# Patient Record
Sex: Female | Born: 1940 | Race: White | Hispanic: No | Marital: Married | Smoking: Never smoker
Health system: Southern US, Community
[De-identification: ages and names within clinical notes are randomized; demographics above are authoritative.]

## PROBLEM LIST (undated history)

## (undated) DIAGNOSIS — E119 Type 2 diabetes mellitus without complications: Secondary | ICD-10-CM

## (undated) DIAGNOSIS — K5790 Diverticulosis of intestine, part unspecified, without perforation or abscess without bleeding: Secondary | ICD-10-CM

## (undated) DIAGNOSIS — E785 Hyperlipidemia, unspecified: Secondary | ICD-10-CM

## (undated) DIAGNOSIS — D649 Anemia, unspecified: Secondary | ICD-10-CM

## (undated) DIAGNOSIS — I779 Disorder of arteries and arterioles, unspecified: Secondary | ICD-10-CM

## (undated) DIAGNOSIS — R931 Abnormal findings on diagnostic imaging of heart and coronary circulation: Secondary | ICD-10-CM

## (undated) DIAGNOSIS — I639 Cerebral infarction, unspecified: Secondary | ICD-10-CM

## (undated) DIAGNOSIS — I739 Peripheral vascular disease, unspecified: Secondary | ICD-10-CM

## (undated) DIAGNOSIS — I2510 Atherosclerotic heart disease of native coronary artery without angina pectoris: Secondary | ICD-10-CM

## (undated) DIAGNOSIS — IMO0001 Reserved for inherently not codable concepts without codable children: Secondary | ICD-10-CM

## (undated) DIAGNOSIS — M1990 Unspecified osteoarthritis, unspecified site: Secondary | ICD-10-CM

## (undated) HISTORY — DX: Unspecified osteoarthritis, unspecified site: M19.90

## (undated) HISTORY — DX: Anemia, unspecified: D64.9

## (undated) HISTORY — DX: Type 2 diabetes mellitus without complications: E11.9

## (undated) HISTORY — DX: Cerebral infarction, unspecified: I63.9

## (undated) HISTORY — DX: Diverticulosis of intestine, part unspecified, without perforation or abscess without bleeding: K57.90

## (undated) HISTORY — DX: Atherosclerotic heart disease of native coronary artery without angina pectoris: I25.10

## (undated) HISTORY — DX: Reserved for inherently not codable concepts without codable children: IMO0001

## (undated) HISTORY — DX: Disorder of arteries and arterioles, unspecified: I77.9

## (undated) HISTORY — PX: ABDOMINAL HYSTERECTOMY: SHX81

## (undated) HISTORY — DX: Peripheral vascular disease, unspecified: I73.9

## (undated) HISTORY — DX: Abnormal findings on diagnostic imaging of heart and coronary circulation: R93.1

## (undated) HISTORY — DX: Hyperlipidemia, unspecified: E78.5

---

## 2000-01-28 ENCOUNTER — Other Ambulatory Visit: Admission: RE | Admit: 2000-01-28 | Discharge: 2000-01-28 | Payer: Self-pay | Admitting: Obstetrics and Gynecology

## 2001-05-22 ENCOUNTER — Ambulatory Visit (HOSPITAL_COMMUNITY): Admission: RE | Admit: 2001-05-22 | Discharge: 2001-05-22 | Payer: Self-pay | Admitting: Obstetrics and Gynecology

## 2001-05-22 ENCOUNTER — Encounter: Payer: Self-pay | Admitting: Obstetrics and Gynecology

## 2002-08-08 ENCOUNTER — Encounter: Payer: Self-pay | Admitting: Obstetrics and Gynecology

## 2002-08-08 ENCOUNTER — Encounter: Admission: RE | Admit: 2002-08-08 | Discharge: 2002-08-08 | Payer: Self-pay | Admitting: Obstetrics and Gynecology

## 2003-04-28 ENCOUNTER — Encounter: Admission: RE | Admit: 2003-04-28 | Discharge: 2003-04-28 | Payer: Self-pay | Admitting: Obstetrics and Gynecology

## 2003-04-28 ENCOUNTER — Encounter: Payer: Self-pay | Admitting: Obstetrics and Gynecology

## 2003-12-09 ENCOUNTER — Encounter: Admission: RE | Admit: 2003-12-09 | Discharge: 2003-12-09 | Payer: Self-pay | Admitting: Obstetrics and Gynecology

## 2004-09-08 ENCOUNTER — Ambulatory Visit: Payer: Self-pay | Admitting: Internal Medicine

## 2004-10-06 ENCOUNTER — Ambulatory Visit: Payer: Self-pay | Admitting: Internal Medicine

## 2004-11-07 ENCOUNTER — Encounter: Payer: Self-pay | Admitting: Internal Medicine

## 2004-11-07 LAB — CONVERTED CEMR LAB

## 2004-11-17 ENCOUNTER — Ambulatory Visit: Payer: Self-pay | Admitting: Internal Medicine

## 2004-11-25 ENCOUNTER — Ambulatory Visit: Payer: Self-pay | Admitting: Family Medicine

## 2005-01-19 ENCOUNTER — Encounter: Admission: RE | Admit: 2005-01-19 | Discharge: 2005-01-19 | Payer: Self-pay

## 2005-02-04 ENCOUNTER — Ambulatory Visit: Payer: Self-pay | Admitting: Internal Medicine

## 2005-05-04 ENCOUNTER — Ambulatory Visit: Payer: Self-pay | Admitting: Internal Medicine

## 2005-05-31 ENCOUNTER — Ambulatory Visit: Payer: Self-pay | Admitting: Internal Medicine

## 2005-07-05 ENCOUNTER — Ambulatory Visit: Payer: Self-pay | Admitting: Internal Medicine

## 2005-08-03 ENCOUNTER — Ambulatory Visit: Payer: Self-pay | Admitting: Internal Medicine

## 2005-09-22 ENCOUNTER — Ambulatory Visit: Payer: Self-pay | Admitting: Internal Medicine

## 2005-11-02 ENCOUNTER — Ambulatory Visit: Payer: Self-pay | Admitting: Internal Medicine

## 2005-12-22 ENCOUNTER — Ambulatory Visit: Payer: Self-pay | Admitting: Family Medicine

## 2006-03-07 ENCOUNTER — Encounter: Admission: RE | Admit: 2006-03-07 | Discharge: 2006-03-07 | Payer: Self-pay | Admitting: Obstetrics and Gynecology

## 2007-01-16 ENCOUNTER — Ambulatory Visit: Payer: Self-pay | Admitting: Internal Medicine

## 2007-02-06 ENCOUNTER — Ambulatory Visit: Payer: Self-pay | Admitting: Gastroenterology

## 2007-02-16 ENCOUNTER — Ambulatory Visit: Payer: Self-pay | Admitting: Gastroenterology

## 2007-02-16 ENCOUNTER — Encounter (INDEPENDENT_AMBULATORY_CARE_PROVIDER_SITE_OTHER): Payer: Self-pay | Admitting: Specialist

## 2007-03-01 ENCOUNTER — Ambulatory Visit: Payer: Self-pay | Admitting: Internal Medicine

## 2007-03-01 LAB — CONVERTED CEMR LAB
ALT: 14 units/L (ref 0–40)
AST: 24 units/L (ref 0–37)
Alkaline Phosphatase: 89 units/L (ref 39–117)
Anti Nuclear Antibody(ANA): NEGATIVE
BUN: 10 mg/dL (ref 6–23)
Basophils Relative: 0.2 % (ref 0.0–1.0)
Bilirubin, Direct: 0.1 mg/dL (ref 0.0–0.3)
CO2: 33 meq/L — ABNORMAL HIGH (ref 19–32)
Calcium: 9.6 mg/dL (ref 8.4–10.5)
Chloride: 102 meq/L (ref 96–112)
Direct LDL: 112.6 mg/dL
Eosinophils Relative: 5 % (ref 0.0–5.0)
Glucose, Bld: 244 mg/dL — ABNORMAL HIGH (ref 70–99)
HCT: 38.9 % (ref 36.0–46.0)
Hemoglobin: 13.2 g/dL (ref 12.0–15.0)
Lymphocytes Relative: 18.7 % (ref 12.0–46.0)
MCV: 88.6 fL (ref 78.0–100.0)
Monocytes Relative: 6.6 % (ref 3.0–11.0)
Platelets: 206 10*3/uL (ref 150–400)
RBC: 4.39 M/uL (ref 3.87–5.11)
RDW: 11.7 % (ref 11.5–14.6)
Rhuematoid fact SerPl-aCnc: <20 intl units/mL — ABNORMAL LOW (ref 0.0–20.0)
Total Protein: 7 g/dL (ref 6.0–8.3)

## 2007-03-08 ENCOUNTER — Ambulatory Visit: Payer: Self-pay | Admitting: Internal Medicine

## 2007-03-13 ENCOUNTER — Encounter: Admission: RE | Admit: 2007-03-13 | Discharge: 2007-06-11 | Payer: Self-pay | Admitting: Internal Medicine

## 2007-03-14 ENCOUNTER — Encounter: Payer: Self-pay | Admitting: Internal Medicine

## 2007-03-14 DIAGNOSIS — E119 Type 2 diabetes mellitus without complications: Secondary | ICD-10-CM | POA: Insufficient documentation

## 2007-03-14 DIAGNOSIS — M1990 Unspecified osteoarthritis, unspecified site: Secondary | ICD-10-CM | POA: Insufficient documentation

## 2007-03-14 DIAGNOSIS — I10 Essential (primary) hypertension: Secondary | ICD-10-CM

## 2007-03-15 ENCOUNTER — Encounter: Admission: RE | Admit: 2007-03-15 | Discharge: 2007-03-15 | Payer: Self-pay | Admitting: Internal Medicine

## 2007-05-01 ENCOUNTER — Ambulatory Visit: Payer: Self-pay | Admitting: Internal Medicine

## 2007-05-01 DIAGNOSIS — E785 Hyperlipidemia, unspecified: Secondary | ICD-10-CM

## 2007-05-01 DIAGNOSIS — Z860100 Personal history of colon polyps, unspecified: Secondary | ICD-10-CM | POA: Insufficient documentation

## 2007-05-01 DIAGNOSIS — K5730 Diverticulosis of large intestine without perforation or abscess without bleeding: Secondary | ICD-10-CM | POA: Insufficient documentation

## 2007-08-01 ENCOUNTER — Ambulatory Visit: Payer: Self-pay | Admitting: Internal Medicine

## 2007-08-01 DIAGNOSIS — I498 Other specified cardiac arrhythmias: Secondary | ICD-10-CM | POA: Insufficient documentation

## 2007-08-29 ENCOUNTER — Ambulatory Visit: Payer: Self-pay | Admitting: Internal Medicine

## 2007-10-09 ENCOUNTER — Ambulatory Visit: Payer: Self-pay | Admitting: Internal Medicine

## 2007-10-25 ENCOUNTER — Ambulatory Visit: Payer: Self-pay | Admitting: Internal Medicine

## 2008-02-08 ENCOUNTER — Ambulatory Visit: Payer: Self-pay | Admitting: Internal Medicine

## 2008-02-10 LAB — CONVERTED CEMR LAB
AST: 22 units/L (ref 0–37)
Albumin: 4.1 g/dL (ref 3.5–5.2)
BUN: 10 mg/dL (ref 6–23)
Basophils Absolute: 0 10*3/uL (ref 0.0–0.1)
Basophils Relative: 0.5 % (ref 0.0–1.0)
Calcium: 9.5 mg/dL (ref 8.4–10.5)
Cholesterol: 146 mg/dL (ref 0–200)
Creatinine, Ser: 1 mg/dL (ref 0.4–1.2)
Creatinine,U: 255.5 mg/dL
Eosinophils Absolute: 0.3 10*3/uL (ref 0.0–0.7)
Eosinophils Relative: 5 % (ref 0.0–5.0)
GFR calc Af Amer: 71 mL/min
GFR calc non Af Amer: 59 mL/min
HCT: 39.6 % (ref 36.0–46.0)
Hgb A1c MFr Bld: 6.4 % — ABNORMAL HIGH (ref 4.6–6.0)
MCHC: 32.3 g/dL (ref 30.0–36.0)
MCV: 91.7 fL (ref 78.0–100.0)
Microalb Creat Ratio: 12.5 mg/g (ref 0.0–30.0)
Microalb, Ur: 3.2 mg/dL — ABNORMAL HIGH (ref 0.0–1.9)
Monocytes Absolute: 0.4 10*3/uL (ref 0.1–1.0)
Neutro Abs: 3.5 10*3/uL (ref 1.4–7.7)
Neutrophils Relative %: 66.5 % (ref 43.0–77.0)
RBC: 4.32 M/uL (ref 3.87–5.11)
TSH: 2.9 microintl units/mL (ref 0.35–5.50)
Total Bilirubin: 0.8 mg/dL (ref 0.3–1.2)
VLDL: 27 mg/dL (ref 0–40)
WBC: 5.3 10*3/uL (ref 4.5–10.5)

## 2008-02-18 ENCOUNTER — Ambulatory Visit: Payer: Self-pay | Admitting: Internal Medicine

## 2008-02-18 LAB — CONVERTED CEMR LAB: HDL goal, serum: 40 mg/dL

## 2008-04-14 ENCOUNTER — Encounter: Admission: RE | Admit: 2008-04-14 | Discharge: 2008-04-14 | Payer: Self-pay | Admitting: Internal Medicine

## 2008-08-05 ENCOUNTER — Ambulatory Visit: Payer: Self-pay | Admitting: Internal Medicine

## 2008-08-08 ENCOUNTER — Ambulatory Visit: Payer: Self-pay | Admitting: Internal Medicine

## 2009-02-13 ENCOUNTER — Ambulatory Visit: Payer: Self-pay | Admitting: Internal Medicine

## 2009-02-13 LAB — CONVERTED CEMR LAB
ALT: 12 units/L (ref 0–35)
AST: 17 units/L (ref 0–37)
Alkaline Phosphatase: 58 units/L (ref 39–117)
Bilirubin Urine: NEGATIVE
Blood in Urine, dipstick: NEGATIVE
Chloride: 103 meq/L (ref 96–112)
Creatinine,U: 146.9 mg/dL
Eosinophils Relative: 6.8 % — ABNORMAL HIGH (ref 0.0–5.0)
GFR calc non Af Amer: 66.02 mL/min (ref >60)
Glucose, Bld: 133 mg/dL — ABNORMAL HIGH (ref 70–99)
Hgb A1c MFr Bld: 6.7 % — ABNORMAL HIGH (ref 4.6–6.5)
Ketones, urine, test strip: NEGATIVE
LDL Cholesterol: 87 mg/dL (ref 0–99)
Lymphocytes Relative: 22.5 % (ref 12.0–46.0)
Microalb Creat Ratio: 5.4 mg/g (ref 0.0–30.0)
Microalb, Ur: 0.8 mg/dL (ref 0.0–1.9)
Monocytes Relative: 8.2 % (ref 3.0–12.0)
Neutrophils Relative %: 62 % (ref 43.0–77.0)
Platelets: 149 10*3/uL — ABNORMAL LOW (ref 150.0–400.0)
Potassium: 3.8 meq/L (ref 3.5–5.1)
Sodium: 140 meq/L (ref 135–145)
TSH: 2.7 microintl units/mL (ref 0.35–5.50)
Total Bilirubin: 0.7 mg/dL (ref 0.3–1.2)
Urobilinogen, UA: 0.2
VLDL: 24.2 mg/dL (ref 0.0–40.0)
WBC: 4.8 10*3/uL (ref 4.5–10.5)

## 2009-02-20 ENCOUNTER — Ambulatory Visit: Payer: Self-pay | Admitting: Internal Medicine

## 2009-05-06 ENCOUNTER — Encounter: Admission: RE | Admit: 2009-05-06 | Discharge: 2009-05-06 | Payer: Self-pay | Admitting: Internal Medicine

## 2009-08-14 ENCOUNTER — Ambulatory Visit: Payer: Self-pay | Admitting: Internal Medicine

## 2009-08-14 LAB — CONVERTED CEMR LAB
Eosinophils Relative: 7.2 % — ABNORMAL HIGH (ref 0.0–5.0)
Ferritin: 73.9 ng/mL (ref 10.0–291.0)
Hgb A1c MFr Bld: 7 % — ABNORMAL HIGH (ref 4.6–6.5)
MCV: 91.7 fL (ref 78.0–100.0)
Monocytes Absolute: 0.4 10*3/uL (ref 0.1–1.0)
Monocytes Relative: 7.7 % (ref 3.0–12.0)
Neutrophils Relative %: 64.9 % (ref 43.0–77.0)
Platelets: 173 10*3/uL (ref 150.0–400.0)
WBC: 5.4 10*3/uL (ref 4.5–10.5)

## 2009-08-21 ENCOUNTER — Ambulatory Visit: Payer: Self-pay | Admitting: Internal Medicine

## 2009-09-22 ENCOUNTER — Telehealth: Payer: Self-pay | Admitting: *Deleted

## 2009-09-24 ENCOUNTER — Telehealth: Payer: Self-pay | Admitting: *Deleted

## 2009-10-07 ENCOUNTER — Ambulatory Visit: Payer: Self-pay | Admitting: Internal Medicine

## 2009-11-07 HISTORY — PX: CATARACT EXTRACTION: SUR2

## 2010-02-12 ENCOUNTER — Ambulatory Visit: Payer: Self-pay | Admitting: Internal Medicine

## 2010-02-12 LAB — CONVERTED CEMR LAB
ALT: 13 units/L (ref 0–35)
AST: 18 units/L (ref 0–37)
Direct LDL: 99.6 mg/dL
Hgb A1c MFr Bld: 7 % — ABNORMAL HIGH (ref 4.6–6.5)
Microalb Creat Ratio: 12.2 mg/g (ref 0.0–30.0)
Total Bilirubin: 0.5 mg/dL (ref 0.3–1.2)
Triglycerides: 214 mg/dL — ABNORMAL HIGH (ref 0.0–149.0)

## 2010-02-19 ENCOUNTER — Ambulatory Visit: Payer: Self-pay | Admitting: Internal Medicine

## 2010-05-21 ENCOUNTER — Encounter: Admission: RE | Admit: 2010-05-21 | Discharge: 2010-05-21 | Payer: Self-pay | Admitting: Internal Medicine

## 2010-05-21 LAB — HM MAMMOGRAPHY

## 2010-05-24 ENCOUNTER — Telehealth: Payer: Self-pay | Admitting: *Deleted

## 2010-07-16 ENCOUNTER — Ambulatory Visit: Payer: Self-pay | Admitting: Internal Medicine

## 2010-07-16 LAB — CONVERTED CEMR LAB
Cholesterol: 165 mg/dL (ref 0–200)
HDL: 32.7 mg/dL — ABNORMAL LOW (ref >39.00)
Total CHOL/HDL Ratio: 5
Triglycerides: 169 mg/dL — ABNORMAL HIGH (ref 0.0–149.0)

## 2010-07-23 ENCOUNTER — Ambulatory Visit: Payer: Self-pay | Admitting: Internal Medicine

## 2010-07-23 LAB — HM DIABETES FOOT EXAM

## 2010-09-07 HISTORY — PX: OTHER SURGICAL HISTORY: SHX169

## 2010-09-21 ENCOUNTER — Encounter: Payer: Self-pay | Admitting: Internal Medicine

## 2010-09-23 ENCOUNTER — Encounter: Payer: Self-pay | Admitting: Family Medicine

## 2010-09-23 ENCOUNTER — Encounter: Payer: Self-pay | Admitting: *Deleted

## 2010-10-19 ENCOUNTER — Ambulatory Visit: Payer: Self-pay | Admitting: Internal Medicine

## 2010-10-25 ENCOUNTER — Ambulatory Visit: Payer: Self-pay | Admitting: Internal Medicine

## 2010-10-25 DIAGNOSIS — D485 Neoplasm of uncertain behavior of skin: Secondary | ICD-10-CM

## 2010-11-03 ENCOUNTER — Telehealth (INDEPENDENT_AMBULATORY_CARE_PROVIDER_SITE_OTHER): Payer: Self-pay | Admitting: *Deleted

## 2010-11-26 ENCOUNTER — Ambulatory Visit
Admission: RE | Admit: 2010-11-26 | Discharge: 2010-11-26 | Payer: Self-pay | Source: Home / Self Care | Attending: Internal Medicine | Admitting: Internal Medicine

## 2010-11-26 ENCOUNTER — Other Ambulatory Visit: Payer: Self-pay | Admitting: Internal Medicine

## 2010-11-26 DIAGNOSIS — T50995A Adverse effect of other drugs, medicaments and biological substances, initial encounter: Secondary | ICD-10-CM | POA: Insufficient documentation

## 2010-12-09 NOTE — Assessment & Plan Note (Signed)
Summary: 6 month rov/njr   Vital Signs:  Patient profile:   70 year old female Menstrual status:  hysterectomy Height:      62 inches Weight:      137 pounds BMI:     25.15 Pulse rate:   73 / minute BP sitting:   120 / 78  (left arm) Cuff size:   regular  Vitals Entered By: Romualdo Bolk, CMA (AAMA) (February 19, 2010 10:15 AM) CC: Follow-up visit on labs, Hypertension Management   History of Present Illness: Sharon Wilson comesin comes in today  for  follow up of multiple medical problems .   DM :  doing ok no change in meds   no lows .  could eat better . LIPIDs taking the meds   less exercise recently and dietary indiscretion    OA : no change  Bp and tachy stable no co .   No change in vision neuro . Cv PUlm signs  over the counter b12  about once a week now. No balance or numbness problem OA; NO chnage    Hypertension History:      She denies headache, chest pain, palpitations, dyspnea with exertion, orthopnea, PND, peripheral edema, visual symptoms, neurologic problems, syncope, and side effects from treatment.  She notes no problems with any antihypertensive medication side effects.        Positive major cardiovascular risk factors include female age 19 years old or older, diabetes, hyperlipidemia, and hypertension.  Negative major cardiovascular risk factors include non-tobacco-user status.      Preventive Screening-Counseling & Management  Alcohol-Tobacco     Alcohol drinks/day: 0     Smoking Status: never  Caffeine-Diet-Exercise     Caffeine use/day: 1     Does Patient Exercise: yes  Hep-HIV-STD-Contraception     Dental Visit-last 6 months yes  Safety-Violence-Falls     Seat Belt Use: yes  Current Medications (verified): 1)  Aspir-81 81 Mg Tbec (Aspirin) .... Take 1 Tablet By Mouth Every Morning 2)  Fish Oil 500 Mg Caps (Omega-3 Fatty Acids) .... Take 1 Capsule By Mouth Twice A Day 3)  Os-Cal 500 + D 500-400 Mg-Unit Tabs (Calcium  Carbonate-Vitamin D) .... Take 1 Tablet By Mouth Twice A  Day 4)  Metformin Hcl 500 Mg  Tabs (Metformin Hcl) .Marland Kitchen.. 1 Two Times A Day 5)  Metoprolol Tartrate 25 Mg  Tabs (Metoprolol Tartrate) .Marland Kitchen.. 1 By Mouth Two Times A Day 6)  Pravachol 40 Mg  Tabs (Pravastatin Sodium) .... 2 By Mouth Once Daily 7)  Vitamin B-12 1000 Mcg Tabs (Cyanocobalamin)  Allergies (verified): No Known Drug Allergies  Past History:  Past medical, surgical, family and social histories (including risk factors) reviewed, and no changes noted (except as noted below).  Past Medical History: Reviewed history from 02/20/2009 and no changes required. Diabetes mellitus, type II Hypertension with white coat component Osteoarthritis G4P4 Diverticulosis, colon Colonic polyps, hx of Hyperlipidemia Anemia before hysterectomy   CONSULTANTS  Jarold Motto  Past Surgical History: Reviewed history from 08/21/2009 and no changes required. Hysterectomy for bleeding fibroid   Past History:  Care Management:  gets regular eye checks  GI  [facility]  Family History: Reviewed history from 02/20/2009 and no changes required. [place] no change mom breast cancer  died age 41 father died cad age 69    Social History: Reviewed history from 02/20/2009 and no changes required. Never Smoked  no ETS Regular exercise-yes not as much recently  Married husband retired now.  hh of 2    no pets    Dental Care w/in 6 mos.:  yes Seat Belt Use:  yes  Review of Systems  The patient denies anorexia, fever, weight loss, weight gain, vision loss, decreased hearing, hoarseness, chest pain, syncope, dyspnea on exertion, peripheral edema, prolonged cough, headaches, abdominal pain, melena, hematochezia, severe indigestion/heartburn, hematuria, transient blindness, difficulty walking, depression, abnormal bleeding, enlarged lymph nodes, and angioedema.    Physical Exam  General:  Well-developed,well-nourished,in no acute distress;  alert,appropriate and cooperative throughout examination Head:  normocephalic and atraumatic.   Eyes:  .ey  Neck:  No deformities, masses, or tenderness noted. Lungs:  Normal respiratory effort, chest expands symmetrically. Lungs are clear to auscultation, no crackles or wheezes. Heart:  Normal rate and regular rhythm. S1 and S2 normal without gallop, murmur, click, rub or other extra sounds.no lifts.   Abdomen:  Bowel sounds positive,abdomen soft and non-tender without masses, organomegaly or   noted. Pulses:  pulses intact without delay   Extremities:  no clubbing cyanosis or edema  Skin:  turgor normal, color normal, no ecchymoses, and no petechiae.   Cervical Nodes:  No lymphadenopathy noted Psych:  Oriented X3, good eye contact, not anxious appearing, and not depressed appearing.    Diabetes Management Exam:    Foot Exam (with socks and/or shoes not present):       Sensory-Monofilament:          Left foot: normal          Right foot: normal       Inspection:          Left foot: normal          Right foot: normal       Nails:          Left foot: normal          Right foot: normal   Impression & Recommendations:  Problem # 1:  DIABETES MELLITUS, TYPE II (ICD-250.00) Assessment Deteriorated  re institute lifestyle intervention  Her updated medication list for this problem includes:    Aspir-81 81 Mg Tbec (Aspirin) .Marland Kitchen... Take 1 tablet by mouth every morning    Metformin Hcl 500 Mg Tabs (Metformin hcl) .Marland Kitchen... 1 two times a day  Labs Reviewed: Creat: 0.9 (02/13/2009)     Last Eye Exam: normal (06/07/2009) Reviewed HgBA1c results: 7.0 (02/12/2010)  7.0 (08/14/2009)  Orders: Prescription Created Electronically [id number])  Problem # 2:  HYPERLIPIDEMIA (ICD-272.4)  intensify lifestyle intervention for the tg  Her updated medication list for this problem includes:    Pravachol 40 Mg Tabs (Pravastatin sodium) .Marland Kitchen... 2 by mouth once daily  Labs Reviewed: SGOT: 18 (02/12/2010)    SGPT: 13 (02/12/2010)  Lipid Goals: Chol Goal: 200 (02/18/2008)   HDL Goal: 40 (02/18/2008)   LDL Goal: 100 (02/18/2008)   TG Goal: 150 (02/18/2008)  10 Yr Risk Heart Disease: 17 % Prior 10 Yr Risk Heart Disease: > 32 % (02/18/2008)   HDL:38.50 (02/12/2010), 34.00 (02/13/2009)  LDL:87 (02/13/2009), 87 (02/08/2008)  Chol:169 (02/12/2010), 145 (02/13/2009)  Trig:214.0 (02/12/2010), 121.0 (02/13/2009)  Problem # 3:  HYPERTENSION (ICD-401.9)  Her updated medication list for this problem includes:    Metoprolol Tartrate 25 Mg Tabs (Metoprolol tartrate) .Marland Kitchen... 1 by mouth two times a day  BP today: 120/78 Prior BP: 120/80 (10/07/2009)  10 Yr Risk Heart Disease: 17 % Prior 10 Yr Risk Heart Disease: > 32 % (02/18/2008)  Labs Reviewed: K+: 3.8 (02/13/2009) Creat: : 0.9 (02/13/2009)  Chol: 169 (02/12/2010)   HDL: 38.50 (02/12/2010)   LDL: 87 (02/13/2009)   TG: 214.0 (02/12/2010)  Problem # 4:  lower b 12   on otc weekly   will recheck at next blood test.    Problem # 5:  OSTEOARTHRITIS (ICD-715.90) no change  Her updated medication list for this problem includes:    Aspir-81 81 Mg Tbec (Aspirin) .Marland Kitchen... Take 1 tablet by mouth every morning  Complete Medication List: 1)  Aspir-81 81 Mg Tbec (Aspirin) .... Take 1 tablet by mouth every morning 2)  Fish Oil 500 Mg Caps (Omega-3 fatty acids) .... Take 1 capsule by mouth twice a day 3)  Os-cal 500 + D 500-400 Mg-unit Tabs (Calcium carbonate-vitamin d) .... Take 1 tablet by mouth twice a  day 4)  Metformin Hcl 500 Mg Tabs (Metformin hcl) .Marland Kitchen.. 1 two times a day 5)  Metoprolol Tartrate 25 Mg Tabs (Metoprolol tartrate) .Marland Kitchen.. 1 by mouth two times a day 6)  Pravachol 40 Mg Tabs (Pravastatin sodium) .... 2 by mouth once daily 7)  Vitamin B-12 1000 Mcg Tabs (Cyanocobalamin)  Hypertension Assessment/Plan:      The patient's hypertensive risk group is category C: Target organ damage and/or diabetes.  Her calculated 10 year risk of coronary heart  disease is 17 %.  Today's blood pressure is 120/78.  Her blood pressure goal is < 130/80.  Patient Instructions: 1)  In September  OV 2)  HgBA1c prior to visit  ICD-9:  3)  Lipid panel prior to visit ICD-9 :  4)  B12 level    Prescriptions: METFORMIN HCL 500 MG  TABS (METFORMIN HCL) 1 two times a day  #180 Each x 2   Entered and Authorized by:   Madelin Headings MD   Signed by:   Madelin Headings MD on 02/19/2010   Method used:   Electronically to        [facility]  [phone]* (retail)       [address]       [place], [place]  [address]       Ph: [phone] or [phone]       Fax: [phone]   RxID:   [id number]   Prevention & Chronic Care Immunizations   Influenza vaccine: Fluvax 3+  (08/21/2009)    Tetanus booster: 08/21/2009: Td    Pneumococcal vaccine: Historical  (05/02/2007)    H. zoster vaccine: 02/20/2009: Zostavax  Colorectal Screening   Hemoccult: Not documented    Colonoscopy: normal  (10/08/2007)  Other Screening   Pap smear: Done  (11/07/2004)    Mammogram: ASSESSMENT: Negative - BI-RADS 1^MM DIGITAL SCREENING  (05/06/2009)    DXA bone density scan: Not documented   Smoking status: never  (02/19/2010)  Diabetes Mellitus   HgbA1C: 7.0  (02/12/2010)    Eye exam: normal  (06/07/2009)   Eye exam due: 06/2010    Foot exam: yes  (02/19/2010)   High risk foot: Not documented   Foot care education: Not documented    Urine microalbumin/creatinine ratio: 12.2  (02/12/2010)  Lipids   Total Cholesterol: 169  (02/12/2010)   LDL: 87  (02/13/2009)   LDL Direct: 99.6  (02/12/2010)   HDL: 38.50  (02/12/2010)   Triglycerides: 214.0  (02/12/2010)    SGOT (AST): 18  (02/12/2010)   SGPT (ALT): 13  (02/12/2010)   Alkaline phosphatase: 53  (02/12/2010)   Total bilirubin: 0.5  (02/12/2010)  Hypertension  Last Blood Pressure: 120 / 78  (02/19/2010)   Serum creatinine: 0.9  (02/13/2009)   Serum potassium 3.8   (02/13/2009)  Self-Management Support :    Diabetes self-management support: Not documented    Hypertension self-management support: Not documented    Lipid self-management support: Not documented

## 2010-12-09 NOTE — Miscellaneous (Signed)
Summary: Last Eye Exam   Clinical Lists Changes  Observations: Added new observation of DMEYEEXAMNXT: 09/2011 (09/23/2010 9:40) Added new observation of DMEYEEXMRES: normal (09/21/2010 9:41) Added new observation of EYE EXAM BY: [facility] (09/21/2010 9:41) Added new observation of DIAB EYE EX: normal (09/21/2010 9:41)        Diabetes Management Exam:    Eye Exam:       Eye Exam done elsewhere          Date: 09/21/2010          Results: normal          Done by: [facility]

## 2010-12-09 NOTE — Assessment & Plan Note (Signed)
Summary: 3 mo rov/mm   Vital Signs:  Patient profile:   70 year old female Menstrual status:  hysterectomy Weight:      133 pounds Pulse rate:   78 / minute BP sitting:   150 / 86  (left arm) Cuff size:   regular  Vitals Entered By: Romualdo Bolk, CMA [facility]) (October 25, 2010 2:41 PM)  Serial Vital Signs/Assessments:  Time      Position  BP       Pulse  Resp  Temp     By 2:43 PM             144/81                         Romualdo Bolk, CMA (AAMA)  Comments: 2:43 PM Pt's machine By: Romualdo Bolk, CMA (AAMA)   CC: Follow-up visit on labs and bp, Hypertension Management   History of Present Illness: Sharon Wilson.  comes in today  for follow up of BP management and  blood sugar. other issues  Also check new skin area on left .  Bp reading at  home  120/70  and 116/70    other time slight Not 140/90 but when went for cataract surgery  and was 170     but 130 at home.  BG   was   117  today usually good.  LIPD: no change in meds  Has dark spot right neck newer ? should be rmoved or just checked .     Hypertension History:      She denies headache, chest pain, palpitations, dyspnea with exertion, orthopnea, PND, peripheral edema, visual symptoms, neurologic problems, syncope, and side effects from treatment.  She notes no problems with any antihypertensive medication side effects.        Positive major cardiovascular risk factors include female age 53 years old or older, diabetes, hyperlipidemia, and hypertension.  Negative major cardiovascular risk factors include non-tobacco-user status.     Preventive Screening-Counseling & Management  Alcohol-Tobacco     Alcohol drinks/day: 0     Smoking Status: never  Caffeine-Diet-Exercise     Caffeine use/day: 1     Does Patient Exercise: yes  Current Medications (verified): 1)  Metoprolol Tartrate 25 Mg  Tabs (Metoprolol Tartrate) .Marland Kitchen.. 1 By Mouth Two Times A Day 2)  Metformin Hcl 500 Mg  Tabs (Metformin  Hcl) .Marland Kitchen.. 1 Two Times A Day 3)  Pravachol 40 Mg  Tabs (Pravastatin Sodium) .... 2 By Mouth Once Daily 4)  Aspir-81 81 Mg Tbec (Aspirin) .... Take 1 Tablet By Mouth Every Morning 5)  Fish Oil 500 Mg Caps (Omega-3 Fatty Acids) .... Take 1 Capsule By Mouth Twice A Day 6)  Os-Cal 500 + D 500-400 Mg-Unit Tabs (Calcium Carbonate-Vitamin D) .... Take 1 Tablet By Mouth Twice A  Day  Allergies (verified): No Known Drug Allergies  Past History:  Past medical, surgical, family and social histories (including risk factors) reviewed, and no changes noted (except as noted below).  Past Medical History: Diabetes mellitus, type II Hypertension with white coat component Osteoarthritis G4P4 Diverticulosis, colon Colonic polyps, hx of Hyperlipidemia Anemia before hysterectomy  Carotid dopplers NOvember 2011 per [facility]  CON[facility]ULTANTS  Jarold Motto  Past Surgical History: Hysterectomy for bleeding fibroid  Cataract extraction 2011  Past History:  Care Management:  gets regular eye checks  GI  [facility]  Family History: Reviewed history from 02/20/2009 and no changes required.  [place] no change mom breast cancer  died age 69 father died cad age 82    Social History: Reviewed history from 02/19/2010 and no changes required. Never Smoked  no ETS Regular exercise-yes not as much recently  Married husband retired now.  hh of 2    no pets      Review of Systems  The patient denies anorexia, fever, weight loss, weight gain, vision loss, chest pain, prolonged cough, melena, hematochezia, severe indigestion/heartburn, and abnormal bleeding.         see hpi  Physical Exam  General:  Well-developed,well-nourished,in no acute distress; alert,appropriate and cooperative throughout examination Head:  normocephalic and atraumatic.   Eyes:  vision grossly intact.   Neck:  No deformities, masses, or tenderness noted. Lungs:  Normal respiratory effort, chest expands symmetrically. Lungs are clear  to auscultation, no crackles or wheezes.no dullness.   Heart:  Normal rate and regular rhythm. S1 and S2 normal without gallop, murmur, click, rub or other extra sounds.no lifts.   Pulses:  pulses intact without delay   Skin:  right neck with 1-2 mm grey dark  lesion  with center   pore?    firm non tender no redness Cervical Nodes:  No lymphadenopathy noted Psych:  Oriented X3, good eye contact, not anxious appearing, and not depressed appearing.     Impression & Recommendations:  Problem # 1:  HYPERTENSION (ICD-401.9) unclear control  and unsure why not  yet tried on an ace  .    meds prob predated  dx   will try to transition to ace to see if ocntrolled  Her updated medication list for this problem includes:    Metoprolol Tartrate 25 Mg Tabs (Metoprolol tartrate) .Marland Kitchen... 1 by mouth two times a day    Prinivil 20 Mg Tabs (Lisinopril) .Marland Kitchen... 1 by mouth once daily or as directed  Problem # 2:  DIABETES MELLITUS, TYPE II (ICD-250.00) could be better  Her updated medication list for this problem includes:    Metformin Hcl 500 Mg Tabs (Metformin hcl) .Marland Kitchen... 1 two times a day    Aspir-81 81 Mg Tbec (Aspirin) .Marland Kitchen... Take 1 tablet by mouth every morning    Prinivil 20 Mg Tabs (Lisinopril) .Marland Kitchen... 1 by mouth once daily or as directed  Labs Reviewed: Creat: 0.9 (02/13/2009)     Last Eye Exam: normal (09/21/2010) Reviewed HgBA1c results: 6.9 (10/19/2010)  7.1 (07/16/2010)  Problem # 3:  SKIN LESION, UNCERTAIN SIGNIFICANCE (ICD-238.2) Assessment: New right neck  ? has a pore  can return for removal     Problem # 4:  HYPERLIPIDEMIA (ICD-272.4)  Her updated medication list for this problem includes:    Pravachol 40 Mg Tabs (Pravastatin sodium) .Marland Kitchen... 2 by mouth once daily  Labs Reviewed: SGOT: 18 (02/12/2010)   SGPT: 13 (02/12/2010)  Lipid Goals: Chol Goal: 200 (02/18/2008)   HDL Goal: 40 (02/18/2008)   LDL Goal: 100 (02/18/2008)   TG Goal: 150 (02/18/2008)  10 Yr Risk Heart Disease: > 32  % Prior 10 Yr Risk Heart Disease: 17 % (02/19/2010)   HDL:32.70 (07/16/2010), 38.50 (02/12/2010)  LDL:99 (07/16/2010), 87 (02/13/2009)  Chol:165 (07/16/2010), 169 (02/12/2010)  Trig:169.0 (07/16/2010), 214.0 (02/12/2010)  Problem # 5:  ? of THYROID NODULE, LEFT (ICD-241.0) after patient left  i reviewed    her record and discovered a  scanned doppler reort  from [facility]  had dopplers  of carotids that were normal.  However ther was commment of  thyroid nodule  on left that  could be followed up wth [place] .  This report was never sent to my desktop but to a colleagues and thus I was never aware of this recommendation.  Corrie Dandy  Complete Medication List: 1)  Metoprolol Tartrate 25 Mg Tabs (Metoprolol tartrate) .Marland Kitchen.. 1 by mouth two times a day 2)  Metformin Hcl 500 Mg Tabs (Metformin hcl) .Marland Kitchen.. 1 two times a day 3)  Pravachol 40 Mg Tabs (Pravastatin sodium) .... 2 by mouth once daily 4)  Aspir-81 81 Mg Tbec (Aspirin) .... Take 1 tablet by mouth every morning 5)  Fish Oil 500 Mg Caps (Omega-3 fatty acids) .... Take 1 capsule by mouth twice a day 6)  Os-cal 500 + D 500-400 Mg-unit Tabs (Calcium carbonate-vitamin d) .... Take 1 tablet by mouth twice a  day 7)  Prinivil 20 Mg Tabs (Lisinopril) .Marland Kitchen.. 1 by mouth once daily or as directed  Hypertension Assessment/Plan:      The patient's hypertensive risk group is category C: Target organ damage and/or diabetes.  Her calculated 10 year risk of coronary heart disease is > 32 %.  Today's blood pressure is 150/86.  Her blood pressure goal is < 130/80.  Patient Instructions: 1)  begin  new bp medication  and decrease  metoprolol to 1/2 by mouth two times a day . 2)  (after   if bp  becomeing low  below 105   and  dc the metoprolol  ( take 1/2 once daily for a week then  stop . ) 3)  otherwise    stay on half  dose  . 4)  schedule for  mole removal  and we can recheck your BP situation in a month.  Prescriptions: PRINIVIL 20 MG TABS (LISINOPRIL) 1 by mouth  once daily or as directed  #30 x 3   Entered and Authorized by:   Madelin Headings MD   Signed by:   Madelin Headings MD on 10/25/2010   Method used:   Electronically to        [facility]  [phone]* (retail)       [address]       [place], [place]  [address]       Ph: [phone] or [phone]       Fax: [phone]   RxID:   [id number] METOPROLOL TARTRATE 25 MG  TABS (METOPROLOL TARTRATE) 1 by mouth two times a day  #60 x 3   Entered and Authorized by:   Madelin Headings MD   Signed by:   Madelin Headings MD on 10/25/2010   Method used:   Electronically to        [facility]  [phone]* (retail)       [address]       [place], [place]  [address]       Ph: [phone] or [phone]       Fax: [phone]   RxID:   [id number]    Orders Added: 1)  Est. Patient Level IV [42706]   after patient left  i reviewed    her record and discovered a  scanned doppler reort  from [facility]  had dopplers  of carotids that were normal.  However ther was commment of  thyroid nodule    on left that  could be followed up wth [place] .  This  report was never sent to my desktop but to a colleagues and thus I was never aware of this recommendation.  Corrie Dandy  Appended Document: 3 mo rov/mm please notify patient that on record review it was noted that  there was a ? of thyroid nodule on her doppler done by [facility]. (This report  was not given to me to review.  )  this may not be  of clinical significance  but would rec getting thyroid ultrasound to delineate this  before her next visit .   Appended Document: Orders Update  Pt aware and wants to go ahead with referral. Order sent to [facility]. Romualdo Bolk, CMA [facility])  November 02, 2010 12:43 PM   Clinical Lists Changes  Orders: Added new Referral order of Radiology Referral (Radiology) - Signed      Appended Document: 3 mo rov/mm please see phone note   The report of thyroid nodule was incorrect and in the wrong record.

## 2010-12-09 NOTE — Letter (Signed)
Summary: Eye Exam/[facility]  Eye Exam/[facility]   Imported By: Maryln Gottron 09/28/2010 10:06:41  _____________________________________________________________________  External Attachment:    Type:   Image     Comment:   External Document

## 2010-12-09 NOTE — Assessment & Plan Note (Signed)
Summary: mole removal/njr   Vital Signs:  Patient profile:   70 year old female Menstrual status:  hysterectomy Weight:      133 pounds BMI:     24.41 Temp:     98.6 degrees F oral BP sitting:   142 / 82  (left arm) Cuff size:   regular  Vitals Entered By: Alfred Levins, CMA (November 26, 2010 3:17 PM) CC: remove mole from neck, bp check   History of Present Illness: Sharon Wilson. comes in today  for 2 rteasons  1.follow up of BP change  from metorpolol to ace because of her DM 2. skin area mole  removal of dark area right neck.  See last note .  Since last visit she had noted when changed to lisinopril 20 mg dizziness and tired  and she brings in her BP readings often in the 90-100 range  pusled 70-80s this was after transition from the b blockerHer last week readings were in the low 100s with a few 90 and some 1320 range .  no syncope some spinny dizzy feeling .Marland Kitchen ? some minor cough.     she brings in  her readings to review No cp sob.   Current Medications (verified): 1)  Metformin Hcl 500 Mg  Tabs (Metformin Hcl) .Marland Kitchen.. 1 Two Times A Day 2)  Pravachol 40 Mg  Tabs (Pravastatin Sodium) .... 2 By Mouth Once Daily 3)  Aspir-81 81 Mg Tbec (Aspirin) .... Take 1 Tablet By Mouth Every Morning 4)  Fish Oil 500 Mg Caps (Omega-3 Fatty Acids) .... Take 1 Capsule By Mouth Twice A Day 5)  Os-Cal 500 + D 500-400 Mg-Unit Tabs (Calcium Carbonate-Vitamin D) .... Take 1 Tablet By Mouth Twice A  Day 6)  Prinivil 20 Mg Tabs (Lisinopril) .Marland Kitchen.. 1 By Mouth Once Daily or As Directed  Allergies (verified): No Known Drug Allergies  Past History:  Past medical, surgical, family and social histories (including risk factors) reviewed, and no changes noted (except as noted below).  Past Medical History: Reviewed history from 10/25/2010 and no changes required. Diabetes mellitus, type II Hypertension with white coat component Osteoarthritis G4P4 Diverticulosis, colon Colonic polyps, hx  of Hyperlipidemia Anemia before hysterectomy  Carotid dopplers NOvember 2011 per [facility]  CONSULTANTS  Jarold Motto  Past Surgical History: Reviewed history from 10/25/2010 and no changes required. Hysterectomy for bleeding fibroid  Cataract extraction 2011  Family History: Reviewed history from 02/20/2009 and no changes required. [place] no change mom breast cancer  died age 43 father died cad age 4    Social History: Reviewed history from 02/19/2010 and no changes required. Never Smoked  no ETS Regular exercise-yes not as much recently  Married husband retired now.  hh of 2    no pets      Review of Systems  The patient denies anorexia, fever, weight loss, weight gain, vision loss, chest pain, dyspnea on exertion, peripheral edema, prolonged cough, headaches, abnormal bleeding, and enlarged lymph nodes.    Physical Exam  General:  Well-developed,well-nourished,in no acute distress; alert,appropriate and cooperative throughout examination Head:  normocephalic and atraumatic.   Lungs:  normal respiratory effort and no intercostal retractions.   Heart:  Normal rate and regular rhythm. S1 and S2 normal without gallop, murmur, click, rub or other extra sounds.no lifts.   Skin:  right neck with 1-2 mm grey dark  lesion  with center   pore?    firm non tender no redness Cervical Nodes:  No lymphadenopathy noted  Psych:  Oriented X3 and good eye contact.   bp readings reviewed    Procedure: biopsy of neck lesion.  Method: shave excision  with 15 scalpel  Site: right neck  Lesion size: 2-3 mm  Anesthesia: 1% with epi  1 cc  Disposition: To home  local care and instructions good hemostatis  local dressing  Impression & Recommendations:  Problem # 1:  ADVERSE REACTION TO MEDICATION (EAV-409.81)  ?  bp a bit too low and will adjust this     Problem # 2:  HYPERTENSION (ICD-401.9)  The following medications were removed from the medication list:    Metoprolol Tartrate 25  Mg Tabs (Metoprolol tartrate) .Marland Kitchen... 1 by mouth two times a day Her updated medication list for this problem includes:    Prinivil 20 Mg Tabs (Lisinopril) ..... Stop medication and can add 1/2  by mouth once daily if needed for bp control  Problem # 3:  SKIN LESION, UNCERTAIN SIGNIFICANCE (ICD-238.2)  Orders: Shave Skin Lesion <0.5cm Scalp/neck/hands/feet/genitalia (19147)  Complete Medication List: 1)  Metformin Hcl 500 Mg Tabs (Metformin hcl) .Marland Kitchen.. 1 two times a day 2)  Pravachol 40 Mg Tabs (Pravastatin sodium) .... 2 by mouth once daily 3)  Aspir-81 81 Mg Tbec (Aspirin) .... Take 1 tablet by mouth every morning 4)  Fish Oil 500 Mg Caps (Omega-3 fatty acids) .... Take 1 capsule by mouth twice a day 5)  Os-cal 500 + D 500-400 Mg-unit Tabs (Calcium carbonate-vitamin d) .... Take 1 tablet by mouth twice a  day 6)  Prinivil 20 Mg Tabs (Lisinopril) .... Stop medication and can add 1/2  by mouth once daily if needed for bp control  Patient Instructions: 1)  stop lisinopril for a week or so 2)   and  as BP goes up can restart at 10 mg lisinoprol once daily  3)  call after a month with readings and then decide on follow up .  4)  If cough persists or bp not controlled or  side effects  them will reassess.  5)  keep biopsy area  covered antibiotic ointment call if sign of infection. 6)  Will notify you of path when available   Orders Added: 1)  Est. Patient Level III [82956] 2)  Shave Skin Lesion <0.5cm Scalp/neck/hands/feet/genitalia [11305]

## 2010-12-09 NOTE — Progress Notes (Addendum)
Summary: Pt has questions re: thyroid testing  Phone Note Call from Patient Call back at Home Phone [phone]   Caller: Patient Summary of Call: Pt called and has some question re: thyroid test. Pls call.  Initial call taken by: Lucy Antigua,  November 03, 2010 9:29 AM  Follow-up for Phone Call        Spoke to pt-this document was scanned into the wrong chart. It is not this pt's chart. Pt states that she has never been to Dr. Cherie Ouch office before. So we are going to cancel the appt for the thyroid US. [facility] aware. Follow-up by: Romualdo Bolk, CMA Duncan Dull),  November 03, 2010 10:14 AM  Additional Follow-up for Phone Call Additional follow up Details #1::        pt hus james cb Additional Follow-up by: Heron Sabins,  November 03, 2010 1:08 PM    disc  error in scanning   with patient at November 26 2010 visit. WKP.

## 2010-12-09 NOTE — Assessment & Plan Note (Signed)
Summary: 5 month rov/njr   Vital Signs:  Patient profile:   70 year old female Menstrual status:  hysterectomy Weight:      135 pounds Temp:     98.3 degrees F oral Pulse rate:   73 / minute Pulse rhythm:   regular BP sitting:   168 / 80  (left arm) Cuff size:   regular  Vitals Entered By: Mervin Hack CMA Duncan Dull) (July 23, 2010 10:29 AM)  Serial Vital Signs/Assessments:  Time      Position  BP       Pulse  Resp  Temp     By                     158/70                         Madelin Headings MD  Comments: LA sitting  By: Madelin Headings MD   CC: 5 month follow-up   History of Present Illness: Sharon Wilson comes in today  for  follow up  of multiple medical problems . Since last visit  here  there have been no major changes in health status  . but  traveled a lot this summer   and doing well/     but  didnt exercise and ate less healthy.  BG: checking and getting  130-140  in summer and  get better with diet change .  NO hypoglycemia  BP  : usually 110  and 125 this am .    usually very good  . No change in vision and to do eye exam.  NO neuro problems LIPIDS: no change meds  as above no se   Preventive Screening-Counseling & Management  Alcohol-Tobacco     Alcohol drinks/day: 0     Smoking Status: never  Caffeine-Diet-Exercise     Caffeine use/day: 1     Does Patient Exercise: yes  Allergies: No Known Drug Allergies  Past History:  Past medical, surgical, family and social histories (including risk factors) reviewed, and no changes noted (except as noted below).  Past Medical History: Reviewed history from 02/20/2009 and no changes required. Diabetes mellitus, type II Hypertension with white coat component Osteoarthritis G4P4 Diverticulosis, colon Colonic polyps, hx of Hyperlipidemia Anemia before hysterectomy   CONSULTANTS  Jarold Motto  Past Surgical History: Reviewed history from 08/21/2009 and no changes required. Hysterectomy for  bleeding fibroid   Family History: Reviewed history from 02/20/2009 and no changes required. [place] no change mom breast cancer  died age 31 father died cad age 71    Social History: Reviewed history from 02/19/2010 and no changes required. Never Smoked  no ETS Regular exercise-yes not as much recently  Married husband retired now.  hh of 2    no pets      Review of Systems  The patient denies anorexia, fever, transient blindness, difficulty walking, depression, abnormal bleeding, enlarged lymph nodes, and angioedema.         neg cv pulmonary neuro  . neg bleeing  Gi problems   Physical Exam  General:  Well-developed,well-nourished,in no acute distress; alert,appropriate and cooperative throughout examination Head:  normocephalic and atraumatic.   Eyes:  PERRL, EOMs full, conjunctiva clear vision grossly intact.   Mouth:  pharynx pink and moist.   Neck:  No deformities, masses, or tenderness noted. Lungs:  Normal respiratory effort, chest expands symmetrically. Lungs are clear to auscultation, no crackles or wheezes.no  dullness.   Heart:  Normal rate and regular rhythm. S1 and S2 normal without gallop, murmur, click, rub or other extra sounds.no lifts.   Abdomen:  Bowel sounds positive,abdomen soft and non-tender without masses, organomegaly or   noted. Msk:  no acute changes Pulses:  pulses intact without delay   Extremities:  no clubbing cyanosis or edema  Neurologic:  non focal  Skin:  turgor normal, color normal, no ecchymoses, and no petechiae.   Cervical Nodes:  No lymphadenopathy noted Psych:  Oriented X3, good eye contact, not anxious appearing, and not depressed appearing.    Diabetes Management Exam:    Foot Exam (with socks and/or shoes not present):       Sensory-Monofilament:          Left foot: normal          Right foot: normal       Inspection:          Left foot: normal          Right foot: normal       Nails:          Left foot: normal          Right  foot: normal reviewed labs   Impression & Recommendations:  Problem # 1:  DIABETES MELLITUS, TYPE II (ICD-250.00)  slightly worse    but from dietary changes  .prefers to do lifestyle intervention intstead of increasing meds  nocomplications at present.  Her updated medication list for this problem includes:    Metformin Hcl 500 Mg Tabs (Metformin hcl) .Marland Kitchen... 1 two times a day    Aspir-81 81 Mg Tbec (Aspirin) .Marland Kitchen... Take 1 tablet by mouth every morning  Labs Reviewed: Creat: 0.9 (02/13/2009)     Last Eye Exam: normal (06/07/2009) Reviewed HgBA1c results: 7.1 (07/16/2010)  7.0 (02/12/2010)  Problem # 2:  HYPERLIPIDEMIA (ICD-272.4) no se of meds noted  Her updated medication list for this problem includes:    Pravachol 40 Mg Tabs (Pravastatin sodium) .Marland Kitchen... 2 by mouth once daily  Labs Reviewed: at goal ldl SGOT: 18 (02/12/2010)   SGPT: 13 (02/12/2010)  Lipid Goals: Chol Goal: 200 (02/18/2008)   HDL Goal: 40 (02/18/2008)   LDL Goal: 100 (02/18/2008)   TG Goal: 150 (02/18/2008)  Prior 10 Yr Risk Heart Disease: 17 % (02/19/2010)   HDL:32.70 (07/16/2010), 38.50 (02/12/2010)  LDL:99 (07/16/2010), 87 (02/13/2009)  Chol:165 (07/16/2010), 169 (02/12/2010)  Trig:169.0 (07/16/2010), 214.0 (02/12/2010)  Problem # 3:  HYPERTENSION (ICD-401.9)  up today but  patient assures that  readings were 125 thia am at home and have been ok  but a bit anxious today about visit.  want to wiat on adding meds  because of this  .  Her updated medication list for this problem includes:    Metoprolol Tartrate 25 Mg Tabs (Metoprolol tartrate) .Marland Kitchen... 1 by mouth two times a day  BP today: 168/80 Prior BP: 120/78 (02/19/2010)  Prior 10 Yr Risk Heart Disease: 17 % (02/19/2010)  Labs Reviewed: K+: 3.8 (02/13/2009) Creat: : 0.9 (02/13/2009)   Chol: 165 (07/16/2010)   HDL: 32.70 (07/16/2010)   LDL: 99 (07/16/2010)   TG: 169.0 (07/16/2010)  Problem # 4:  b12 b12 level is good.  Complete Medication List: 1)   Metoprolol Tartrate 25 Mg Tabs (Metoprolol tartrate) .Marland Kitchen.. 1 by mouth two times a day 2)  Metformin Hcl 500 Mg Tabs (Metformin hcl) .Marland Kitchen.. 1 two times a day 3)  Pravachol 40 Mg Tabs (Pravastatin sodium) .Marland KitchenMarland KitchenMarland Kitchen  2 by mouth once daily 4)  Aspir-81 81 Mg Tbec (Aspirin) .... Take 1 tablet by mouth every morning 5)  Fish Oil 500 Mg Caps (Omega-3 fatty acids) .... Take 1 capsule by mouth twice a day 6)  Os-cal 500 + D 500-400 Mg-unit Tabs (Calcium carbonate-vitamin d) .... Take 1 tablet by mouth twice a  day 7)  Vitamin B-12 1000 Mcg Tabs (Cyanocobalamin)  Other Orders: Hgb (04540) Fingerstick (98119) Flu Vaccine 77yrs + (14782) Admin 1st Vaccine (95621) Admin 1st Vaccine [facility]) [phone])  Patient Instructions: 1)  implement lifestyle intervention  2)  Please schedule a follow-up appointment in 3 months  3)    4)  HgBA1c prior to visit  ICD-9:  5)  Bring blood pressure machine  when come to visit so we can check this  6)  Check your  Blood Pressure regularly . If it is above 150/90:   you should make an appointment.     Current Allergies (reviewed today): No known allergies    Influenza Vaccine    Vaccine Type: Fluvax 3+    Site: left deltoid    Mfr: GlaxoSmithKline    Dose: 0.5 ml    Route: IM    Given by: Mervin Hack CMA (AAMA)    Exp. Date: 05/06/2010    Lot #: [id number]    VIS given: 06/01/10 version given July 23, 2010.  Flu Vaccine Consent Questions    Do you have a history of severe allergic reactions to this vaccine? no    Any prior history of allergic reactions to egg and/or gelatin? no    Do you have a sensitivity to the preservative Thimersol? no    Do you have a past history of Guillan-Barre Syndrome? no    Do you currently have an acute febrile illness? no    Have you ever had a severe reaction to latex? no    Vaccine information given and explained to patient? yes    Are you currently pregnant? no

## 2010-12-09 NOTE — Progress Notes (Signed)
Summary: refill  Phone Note Refill Request Call back at Home Phone [phone] Message from:  Patient---live call  Refills Requested: Medication #1:  PRAVACHOL 40 MG  TABS 2 by mouth once daily   Brand Name Necessary? No send to [facility].  send #180 (90 day supply)  Initial call taken by: Warnell Forester,  May 24, 2010 4:27 PM  Follow-up for Phone Call        Rx sent to pharmacy Follow-up by: Romualdo Bolk, CMA Duncan Dull),  May 24, 2010 4:40 PM    Prescriptions: PRAVACHOL 40 MG  TABS (PRAVASTATIN SODIUM) 2 by mouth once daily  #180 x 1   Entered by:   Romualdo Bolk, CMA (AAMA)   Authorized by:   Madelin Headings MD   Signed by:   Romualdo Bolk, CMA (AAMA) on 05/24/2010   Method used:   Electronically to        [facility]  [phone]* (retail)       [address]       [place], [place]  [address]       Ph: [phone] or [phone]       Fax: [phone]   RxID:   [id number]

## 2011-02-22 ENCOUNTER — Other Ambulatory Visit: Payer: Self-pay | Admitting: Internal Medicine

## 2011-02-24 ENCOUNTER — Other Ambulatory Visit: Payer: Self-pay | Admitting: Internal Medicine

## 2011-03-25 NOTE — Assessment & Plan Note (Signed)
[facility] OFFICE NOTE   NAME:ROBINSONDorise, Gangi                  MRN:          [id number]  DATE:01/16/2007                            DOB:          06/22/1941    NEW PATIENT VISIT:  Transfer from [facility], Dr. Debby Bud.   CHIEF COMPLAINT:  New patient, get established, need some refills.   HISTORY OF PRESENT ILLNESS:  Ms. 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  Her previous care has been through Dr. Debby Bud, but because of  convenience issues, she is transferring to the [facility] site.  She has  a history of hypertension and has been on medicine more than 10 years,  atenolol 50 mg.  She states it is controlled at home with blood pressure  readings in the 115 to 120 range, but when she attends doctor visits, it  tends to go up secondary to anxiety.  She usually takes her medications  in the morning.  She has also had some hyperlipidemia and had some  myalgias apparently with 2 different statins and then was placed on  Pravachol 40 mg.  She was also given Zetia to get to goal of an LDL  below 100, but insurance has not paid for it, and, therefore, she is off  of it at present.  She has had fish oil added at 1000 mg one b.i.d., and  her last lipid check, she believes, was over a year ago, maybe December  2006.  She also has some problems with arthritis in her hands with some  stiffness in the morning, and wonders what she can take for it, Tylenol  or Advil.  She does do some bowling and it is fairly active.  Has never  been diagnosed with rheumatic disease.   PAST MEDICAL HISTORY:  1. See database.  2. Chicken pox as a child.  3. Seasonal rhinitis.  4. Hypertension.  5. Hyperlipidemia.   SURGERIES:  Hysterectomy in 1997 for bleeding fibroids.  No malignancy.  She is gravida 4, para 4.  Last Pap was in 2006.  Last mammogram was  2007.   Unsure of her last tetanus shot.  Has had a flexible sigmoid x2  for screening, but that last one was more than 5 years ago.  She has  never had a colonoscopy and is due for this.   MEDICATIONS:  1. Atenolol 50 mg daily.  2. Pravastatin 40 mg a day.  3. Os-Cal 500 plus D one b.i.d.  4. Fish oil 1000 mg b.i.d.   DRUG ALLERGIES:  None, but side effects and some myalgias with TWO TYPES  OF STATINS.   FAMILY HISTORY:  Mother had breast cancer at age 27 and died at 32 of  question complications.  A half sister is overweight.  One family member  is on cholesterol medicine.  Her children are alive and well.  No  history of premature vascular disease or stroke.  Negative for thyroid  disease.   SOCIAL HISTORY:  A homemaker.  Household of 2.  Lives with her  husband.  No pets.  Rare alcohol.  No tobacco.  Tries to stay active.  Exercises  through [employer].  See database.   REVIEW OF SYSTEMS:  Negative for chest pain, shortness of breath, major  GI or GU problems.  Joints as per history of present illness.  The rest  of is either negative or noncontributory.   OBJECTIVE:  VITAL SIGNS:  Height 5 feet, 2 inches, weight 140.  Pulse 72  and regular.  Blood pressure 140/90; on repeat by me was 155/85.  GENERAL:  This is a well-developed, well-nourished, healthy-appearing  lady in no acute distress.  She is minimally anxious.  HEENT:  Grossly normal.  NECK:  Supple without masses, thyromegaly or bruits.  CHEST:  Clear to auscultation and percussion, equal.  CARDIAC:  S1 and S2.  No gallops or murmurs.  Peripheral pulses are  present without __________ .  ABDOMEN:  Soft.  No thyromegaly, guarding or rebound.  NEUROLOGIC:  Grossly intact.  SKIN:  Nonicteric.   IMPRESSION:  1. Hypertension, apparently long-standing on a short-acting beta      blocker with reported white coat hypertension.  We did discuss the      importance of checking her readings at home at different times of      day, and  that if we want to stay on a beta blocker, we might      consider changing to a long acting or b.i.d. dosing for 24-hour      control.  2. Hyperlipidemia without other high risk, except as above.  Will      increase her pravastatin to 80 mg empirically and recheck labs in      about 8 weeks at a follow up visit after that.  3. Osteoarthritis.  Did discuss pros and cons of Tylenol versus      nonsteroidal anti-inflammatory drugs and risk, that she can take      some Advil p.r.n., but minimize cardiovascular, renal and GI risk.  4. Health care maintenance.  She is due for a colonoscopy.  We will      set her up for this.  Also, after her fasting labs, she will come      back in for follow up and finish any other evaluations as      necessary.     Neta Mends. Panosh, MD  Electronically Signed    WKP/MedQ  DD: 01/16/2007  DT: 01/18/2007  Job #: [id number]

## 2011-06-27 ENCOUNTER — Telehealth: Payer: Self-pay | Admitting: *Deleted

## 2011-06-27 MED ORDER — PRAVASTATIN SODIUM 40 MG PO TABS: 80.0000 mg | ORAL_TABLET | Freq: Every day | ORAL | Status: DC

## 2011-06-27 NOTE — Telephone Encounter (Signed)
Pt needs a refill on chol. 90 days supply. Rx sent to pharmacy

## 2011-08-03 ENCOUNTER — Other Ambulatory Visit: Payer: Self-pay | Admitting: Internal Medicine

## 2011-08-03 DIAGNOSIS — Z1231 Encounter for screening mammogram for malignant neoplasm of breast: Secondary | ICD-10-CM

## 2011-08-16 ENCOUNTER — Ambulatory Visit
Admission: RE | Admit: 2011-08-16 | Discharge: 2011-08-16 | Disposition: A | Discharge status: Home / Self Care | Payer: 59 | Source: Ambulatory Visit | Attending: Internal Medicine | Admitting: Internal Medicine

## 2011-08-16 DIAGNOSIS — Z1231 Encounter for screening mammogram for malignant neoplasm of breast: Secondary | ICD-10-CM

## 2011-11-15 ENCOUNTER — Ambulatory Visit (INDEPENDENT_AMBULATORY_CARE_PROVIDER_SITE_OTHER): Payer: 59 | Admitting: Internal Medicine

## 2011-11-15 DIAGNOSIS — Z23 Encounter for immunization: Secondary | ICD-10-CM

## 2011-11-21 ENCOUNTER — Other Ambulatory Visit: Payer: Self-pay | Admitting: Internal Medicine

## 2011-12-30 ENCOUNTER — Other Ambulatory Visit: Payer: Self-pay | Admitting: Internal Medicine

## 2012-02-20 ENCOUNTER — Telehealth: Payer: Self-pay | Admitting: Internal Medicine

## 2012-02-20 NOTE — Telephone Encounter (Signed)
Pt wanted to sch a med check appt in order to get refills, but wanted to know if she needs to get labs done prior to visit and if so, what labs will pt need?

## 2012-02-20 NOTE — Telephone Encounter (Signed)
Pls advise.  

## 2012-02-21 NOTE — Telephone Encounter (Signed)
Lipids lfts bmp tsh hg a1c, urine microalbumin ratio  cbcdiff   Dx Dm elevated lipids  Ht OA

## 2012-02-22 NOTE — Telephone Encounter (Signed)
Spoke with patient and appointments made

## 2012-02-24 ENCOUNTER — Other Ambulatory Visit (INDEPENDENT_AMBULATORY_CARE_PROVIDER_SITE_OTHER): Payer: 59

## 2012-02-24 DIAGNOSIS — I10 Essential (primary) hypertension: Secondary | ICD-10-CM

## 2012-02-24 DIAGNOSIS — E785 Hyperlipidemia, unspecified: Secondary | ICD-10-CM

## 2012-02-24 DIAGNOSIS — IMO0001 Reserved for inherently not codable concepts without codable children: Secondary | ICD-10-CM

## 2012-02-24 LAB — HEMOGLOBIN A1C: Hgb A1c MFr Bld: 7.3 % — ABNORMAL HIGH (ref 4.6–6.5)

## 2012-02-24 LAB — CBC WITH DIFFERENTIAL/PLATELET
Basophils Absolute: 0 10*3/uL (ref 0.0–0.1)
Eosinophils Absolute: 0.3 10*3/uL (ref 0.0–0.7)
HCT: 38.9 % (ref 36.0–46.0)
Hemoglobin: 13.1 g/dL (ref 12.0–15.0)
Lymphs Abs: 0.9 10*3/uL (ref 0.7–4.0)
MCHC: 33.6 g/dL (ref 30.0–36.0)
Neutro Abs: 3.2 10*3/uL (ref 1.4–7.7)
RDW: 13.1 % (ref 11.5–14.6)

## 2012-02-24 LAB — LIPID PANEL
HDL: 40.2 mg/dL (ref >39.00)
LDL Cholesterol: 112 mg/dL — ABNORMAL HIGH (ref 0–99)
Total CHOL/HDL Ratio: 5

## 2012-02-24 LAB — BASIC METABOLIC PANEL
CO2: 28 mEq/L (ref 19–32)
Chloride: 101 mEq/L (ref 96–112)
Glucose, Bld: 131 mg/dL — ABNORMAL HIGH (ref 70–99)
Potassium: 5 mEq/L (ref 3.5–5.1)
Sodium: 137 mEq/L (ref 135–145)

## 2012-02-24 LAB — HEPATIC FUNCTION PANEL
AST: 18 U/L (ref 0–37)
Albumin: 4.3 g/dL (ref 3.5–5.2)
Total Protein: 7 g/dL (ref 6.0–8.3)

## 2012-02-24 LAB — MICROALBUMIN / CREATININE URINE RATIO
Creatinine,U: 86.1 mg/dL
Microalb, Ur: 0.8 mg/dL (ref 0.0–1.9)

## 2012-02-27 ENCOUNTER — Encounter: Payer: Self-pay | Admitting: Internal Medicine

## 2012-02-28 ENCOUNTER — Encounter: Payer: Self-pay | Admitting: Internal Medicine

## 2012-02-28 ENCOUNTER — Ambulatory Visit (INDEPENDENT_AMBULATORY_CARE_PROVIDER_SITE_OTHER): Payer: 59 | Admitting: Internal Medicine

## 2012-02-28 VITALS — BP 210/100 | HR 88 | Temp 98.5°F | Wt 138.0 lb

## 2012-02-28 DIAGNOSIS — Z6379 Other stressful life events affecting family and household: Secondary | ICD-10-CM

## 2012-02-28 DIAGNOSIS — E119 Type 2 diabetes mellitus without complications: Secondary | ICD-10-CM

## 2012-02-28 DIAGNOSIS — Z638 Other specified problems related to primary support group: Secondary | ICD-10-CM

## 2012-02-28 DIAGNOSIS — I10 Essential (primary) hypertension: Secondary | ICD-10-CM

## 2012-02-28 DIAGNOSIS — E785 Hyperlipidemia, unspecified: Secondary | ICD-10-CM

## 2012-02-28 MED ORDER — LISINOPRIL 5 MG PO TABS: 5.0000 mg | ORAL_TABLET | Freq: Every day | ORAL | Status: DC

## 2012-02-28 NOTE — Patient Instructions (Addendum)
Check BP readings at home. Begin low dose BP med.  ROV in about a month or earlier if getting  160 and over .  Intensify lifestyle interventions. To get sugar and cholesterol better.  Ge eye check   Bring  in monitor to check

## 2012-02-29 ENCOUNTER — Encounter: Payer: Self-pay | Admitting: Internal Medicine

## 2012-02-29 DIAGNOSIS — Z6379 Other stressful life events affecting family and household: Secondary | ICD-10-CM | POA: Insufficient documentation

## 2012-02-29 NOTE — Progress Notes (Signed)
  Subjective:    Patient ID: Sharon Wilson, female    DOB: August 31, 1941, 71 y.o.   MRN: [id number]  HPI Patient comes in today for follow up of  multiple medical problems.  DM: no se of meds  Not eating as well and less exercise . Family stress issues  No change in vision infection numbness . Eye check due soon. HT had weaned off the lisinopril because has dizziness  And low bp on 20 of lisinopril in the past . thinks bp is up cause of stress and WC effect. Didn't know it was this high. LIPIDS no se of meds   Review of Systems No fever ha cp sob edema gi su changes  Past history family history social history reviewed in the electronic medical record.    Objective:   Physical Exam BP 210/100  Pulse 88  Temp(Src) 98.5 F (36.9 C) (Oral)  Wt 138 lb (62.596 kg) REPEAT BP right arm 180/90 WDWN in nad HEENT grossly normal  Neck: Supple without adenopathy or masses or bruits Chest:  Clear to A&P without wheezes rales or rhonchi CV:  S1-S2 no gallops or murmurs peripheral perfusion is normal Abdomen:  Sof,t normal bowel sounds without hepatosplenomegaly, no guarding rebound or masses no CVA tenderness No clubbing cyanosis or edema FEET  wnl no callus  Or ulcer  Or sig deformity.  Neuro grossly non focal Lab Results  Component Value Date   WBC 4.7 02/24/2012   HGB 13.1 02/24/2012   HCT 38.9 02/24/2012   PLT 196.0 02/24/2012   GLUCOSE 131* 02/24/2012   CHOL 183 02/24/2012   TRIG 154.0* 02/24/2012   HDL 40.20 02/24/2012   LDLDIRECT 99.6 02/12/2010   LDLCALC 112* 02/24/2012   ALT 13 02/24/2012   AST 18 02/24/2012   NA 137 02/24/2012   K 5.0 02/24/2012   CL 101 02/24/2012   CREATININE 0.8 02/24/2012   BUN 13 02/24/2012   CO2 28 02/24/2012   TSH 2.22 02/24/2012   HGBA1C 7.3* 02/24/2012   MICROALBUR 0.8 02/24/2012     Assessment & Plan:  DM  Some deterioration of levels but acceptable  Would Intensify lifestyle interventions.  and fu.  HT   Very high initial reading  Has WC effect but  Needs  rx   . Had low bp in past on 20 lisin  Disc  Start 5 mg lisinopril and then close fu  We may adjust or use norvasc etc if needed. To recheck in a month or earlier if high   . Bring in monitor to visit.  LIPIDS  Intensify lifestyle interventions.  and continue meds .  Family situation stress . Stress reduction strategies

## 2012-03-01 ENCOUNTER — Telehealth: Payer: Self-pay | Admitting: Internal Medicine

## 2012-03-01 MED ORDER — PRAVASTATIN SODIUM 40 MG PO TABS: 40.0000 mg | ORAL_TABLET | Freq: Every day | ORAL | Status: DC

## 2012-03-01 MED ORDER — METFORMIN HCL 500 MG PO TABS: 500.0000 mg | ORAL_TABLET | Freq: Two times a day (BID) | ORAL | Status: DC

## 2012-03-01 NOTE — Telephone Encounter (Signed)
Rx sent to pharmacy   

## 2012-03-01 NOTE — Telephone Encounter (Signed)
Pt came by office and said that her script for Metformin and pravastatin (PRAVACHOL) 40 MG tablet were not sent in to pharmacy as previously discussed. Pls call in to [facility] [address] today.

## 2012-03-30 ENCOUNTER — Ambulatory Visit (INDEPENDENT_AMBULATORY_CARE_PROVIDER_SITE_OTHER): Payer: 59 | Admitting: Internal Medicine

## 2012-03-30 ENCOUNTER — Encounter: Payer: Self-pay | Admitting: Internal Medicine

## 2012-03-30 VITALS — BP 172/82 | HR 90 | Temp 98.7°F | Wt 133.0 lb

## 2012-03-30 DIAGNOSIS — E785 Hyperlipidemia, unspecified: Secondary | ICD-10-CM

## 2012-03-30 DIAGNOSIS — I10 Essential (primary) hypertension: Secondary | ICD-10-CM

## 2012-03-30 DIAGNOSIS — E119 Type 2 diabetes mellitus without complications: Secondary | ICD-10-CM

## 2012-03-30 DIAGNOSIS — Z6379 Other stressful life events affecting family and household: Secondary | ICD-10-CM

## 2012-03-30 DIAGNOSIS — Z638 Other specified problems related to primary support group: Secondary | ICD-10-CM

## 2012-03-30 MED ORDER — PRAVASTATIN SODIUM 40 MG PO TABS: 80.0000 mg | ORAL_TABLET | Freq: Every day | ORAL | Status: DC

## 2012-03-30 NOTE — Patient Instructions (Signed)
Continue monitoring of  bp different times of day ok. No change in meds at this time. Intensify lifestyle interventions. As you are doing for the diabetes.

## 2012-03-30 NOTE — Progress Notes (Signed)
  Subjective:    Patient ID: Sharon Wilson, female    DOB: April 26, 1941, 71 y.o.   MRN: [id number]  HPI Comes in for follow up of  multiple medical issues BP: Bp ok at home has machine  Has log of readings  Wrist cuff. Majority in 100 110 range some 98 and ocass 140. Stress is less as son in law in [place] now has job. No syncope cp sob . DM bg usually good at home Eye check slight diabetes  Changes  On yearly recall. LIPIDS  taking 2 40 prava per day needs refill for 90 days no se of meds   Review of Systems No cp sob swelling bleeding numbness or neuro sx.  As per hpi  Past history family history social history reviewed in the electronic medical record.     Objective:   Physical Exam BP 172/82  Pulse 90  Temp(Src) 98.7 F (37.1 C) (Oral)  Wt 133 lb (60.328 kg)  SpO2 98% WDWN in nad  Feels ok.  164./78 right reg machine  Wrist machine .  Right .    158/91  CHest cta cv rr no gallop No clubbing cyanosis or edema Lab Results  Component Value Date   HGBA1C 7.3* 02/24/2012   Lab Results  Component Value Date   CHOL 183 02/24/2012   HDL 40.20 02/24/2012   LDLCALC 112* 02/24/2012   LDLDIRECT 99.6 02/12/2010   TRIG 154.0* 02/24/2012   CHOLHDL 5 02/24/2012        Assessment & Plan:  DM  No change in meds monitor  Poss early eye findings by report Bp :is better at home   Her machine correlates . Readings at home on the low side but no se.  Would not increase meds based on this for concern of hypotension. Will monitor as she is doing a few times a week and more if elevated. LIPIDS  Dose wrong in med record  Will correct and rx med.  Stress better  Continue lifestyle intervention healthy eating and exercise .   Labs /fu in 2 months

## 2012-03-31 ENCOUNTER — Encounter: Payer: Self-pay | Admitting: Internal Medicine

## 2012-03-31 NOTE — Assessment & Plan Note (Signed)
is better at home   Her machine correlates . Readings at home on the low side but no se.  Would not increase meds based on this for concern of hypotension. Will monitor as she is doing a few times a week and more if elevated.

## 2012-05-17 ENCOUNTER — Telehealth: Payer: Self-pay | Admitting: Internal Medicine

## 2012-05-17 NOTE — Telephone Encounter (Signed)
If pat having hives she should not take the lisinopril at all.   Make sure she has stopped this and will see her tomorrow.

## 2012-05-17 NOTE — Telephone Encounter (Signed)
Pt was notified to stop taking the lisinopril.  She was also notified by CAN.  We will see her in OV on 05/18/12.

## 2012-05-17 NOTE — Telephone Encounter (Signed)
Caller: Sharon Wilson/Patient; PCP: Madelin Headings.; CB#: [phone]; ; ; Call regarding Hives On Lisinopril; onset of hives within 2-3 days of starting the medication.  States has been 3 weeks, and she has been taking benadryl as suggested by Dr. Fabian Sharp, but the hives keep returning after taking the dose.  States has had nagging cough since hives started.  States had generalized hives, with scratchy throat and mouth itching after dose 05/16/12.  Per protocol, emergent symptoms currently denied; advised appt within 24 hours.  Appt sched 05/18/12 0915 with Dr. Fabian Sharp.

## 2012-05-18 ENCOUNTER — Encounter: Payer: Self-pay | Admitting: Internal Medicine

## 2012-05-18 ENCOUNTER — Ambulatory Visit (INDEPENDENT_AMBULATORY_CARE_PROVIDER_SITE_OTHER): Payer: 59 | Admitting: Internal Medicine

## 2012-05-18 VITALS — BP 180/90 | HR 85 | Temp 97.9°F | Wt 131.0 lb

## 2012-05-18 DIAGNOSIS — T50995A Adverse effect of other drugs, medicaments and biological substances, initial encounter: Secondary | ICD-10-CM

## 2012-05-18 DIAGNOSIS — T465X5A Adverse effect of other antihypertensive drugs, initial encounter: Secondary | ICD-10-CM

## 2012-05-18 DIAGNOSIS — I10 Essential (primary) hypertension: Secondary | ICD-10-CM

## 2012-05-18 DIAGNOSIS — E119 Type 2 diabetes mellitus without complications: Secondary | ICD-10-CM

## 2012-05-18 DIAGNOSIS — Z888 Allergy status to other drugs, medicaments and biological substances status: Secondary | ICD-10-CM

## 2012-05-18 DIAGNOSIS — T7840XA Allergy, unspecified, initial encounter: Secondary | ICD-10-CM

## 2012-05-18 DIAGNOSIS — T887XXA Unspecified adverse effect of drug or medicament, initial encounter: Secondary | ICD-10-CM

## 2012-05-18 MED ORDER — PREDNISONE 10 MG PO TABS: ORAL_TABLET | ORAL | Status: DC

## 2012-05-18 NOTE — Patient Instructions (Addendum)
Stay on the antihistamine   .   Can make you tired . Begin  Prednisone asap with taper can increase BG readings.   No ace inhibitor   Ever.   Check bp readings   And call if getting too high 150 and above and  See you at the rov. About further   bp meds.

## 2012-05-18 NOTE — Progress Notes (Signed)
  Subjective:    Patient ID: Sharon Wilson, female    DOB: 1941-05-15, 71 y.o.   MRN: [id number]  HPI Patient comes in as requested see  Call a Nurse note.  patient had developed hives itching and swelling and associated hoarseness and spasms of coughing on lisinopril. There was no associated fever or wheezing nausea vomiting diarrhea. She was taking Benadryl and then Zyrtec as recommended.    she has not had this problem before and has no history of asthma.   But sugars have been okay 140 range somewhat up no hypoglycemia. Blood pressure readings at home as been 124/62 and was that way this morning she has white coat hypertension and thinks that's what's going on when she came in today. Review of Systems no fever chills shortness of breath bleeding chest pain shortness of breath. She stopped the medication for over 24 hours still has some cough.  Past history family history social history reviewed in the electronic medical record.    Objective:   Physical Exam BP 180/90  Pulse 85  Temp 97.9 F (36.6 C) (Oral)  Wt 131 lb (59.421 kg)  SpO2 99% well-developed well-nourished in no acute distress she has no angioedema he obvious voice is clear no respiratory distress slight hoarseness.  HEENT normocephalic TMs clear eyes clear EOMs full PERRLA OP no lesions redness or swelling. Airway is good. Neck supple without masses or adenopathy Chest:  Clear to A&P without wheezes rales or rhonchi CV:  S1-S2 no gallops or murmurs peripheral perfusion is normal Abdomen:  Sof,t normal bowel sounds without hepatosplenomegaly, no guarding rebound or masses no CVA tenderness Skin shows normal turgor but has a few patchy hive-like reactions on her forearms. Her lips are nonswollen.      Assessment & Plan:  Hives and  history of swelling   and cough with ACE inhibitor Acute reaction consistent with angioedema exam is stable at this time discussed this with patient she should never be on this  medication again.She should continue on the antihistamine and we are going to add prednisone taper discussed reasons with patient this could bump her sugars up at this would be temporary. At this time since her blood pressure is normal at home we'll have her continue to monitor and not add another medicine yet until she returns. She has a followup appointment in a few weeks. She should seek urgent care if has alarm features that are progressing.  HT [facility]   As above  DM monitor  Close fu

## 2012-05-23 ENCOUNTER — Encounter: Payer: Self-pay | Admitting: Internal Medicine

## 2012-05-30 ENCOUNTER — Other Ambulatory Visit (INDEPENDENT_AMBULATORY_CARE_PROVIDER_SITE_OTHER): Payer: 59

## 2012-05-30 DIAGNOSIS — E119 Type 2 diabetes mellitus without complications: Secondary | ICD-10-CM

## 2012-05-30 LAB — BASIC METABOLIC PANEL
BUN: 16 mg/dL (ref 6–23)
GFR: 81.97 mL/min (ref >60.00)
Potassium: 4.3 mEq/L (ref 3.5–5.1)
Sodium: 139 mEq/L (ref 135–145)

## 2012-05-30 LAB — HEMOGLOBIN A1C: Hgb A1c MFr Bld: 7.8 % — ABNORMAL HIGH (ref 4.6–6.5)

## 2012-06-06 ENCOUNTER — Encounter: Payer: Self-pay | Admitting: Internal Medicine

## 2012-06-06 ENCOUNTER — Ambulatory Visit (INDEPENDENT_AMBULATORY_CARE_PROVIDER_SITE_OTHER): Payer: 59 | Admitting: Internal Medicine

## 2012-06-06 VITALS — BP 194/90 | HR 101 | Temp 98.3°F | Wt 133.0 lb

## 2012-06-06 DIAGNOSIS — E119 Type 2 diabetes mellitus without complications: Secondary | ICD-10-CM

## 2012-06-06 DIAGNOSIS — I10 Essential (primary) hypertension: Secondary | ICD-10-CM

## 2012-06-06 DIAGNOSIS — Z888 Allergy status to other drugs, medicaments and biological substances status: Secondary | ICD-10-CM

## 2012-06-06 DIAGNOSIS — IMO0001 Reserved for inherently not codable concepts without codable children: Secondary | ICD-10-CM

## 2012-06-06 DIAGNOSIS — T465X5A Adverse effect of other antihypertensive drugs, initial encounter: Secondary | ICD-10-CM

## 2012-06-06 MED ORDER — AMLODIPINE BESYLATE 2.5 MG PO TABS: 2.5000 mg | ORAL_TABLET | Freq: Every day | ORAL | Status: DC

## 2012-06-06 NOTE — Patient Instructions (Signed)
Begin low dose amlodipine and take each day. Monitor bp readings at least 3 x per week . Call fax mail the bp readings after 4-6 weeks  And call if needed.   Attention to eating and exercise for Blood sugar control .  Has been creeping up even before the prednisone.

## 2012-06-06 NOTE — Progress Notes (Signed)
  Subjective:    Patient ID: Sharon Wilson, female    DOB: [birth year], 71 y.o.   MRN: [id number]  HPI Pt comesin for fu vvisit after stopping  acei for  reaction.   Reaction went away after days and feels well  Now. Had inc appetite on pred. Sugars up but ok then  160 .  BP: 115 and the 134/78 this am before OV.  No cp sob .  New rash    Review of Systems No fever hinves istching or resp sx.  No bleeding  Cp sob cough Past history family history social history reviewed in the electronic medical record.     Objective:   Physical Exam BP 194/90  Pulse 101  Temp 98.3 F (36.8 C) (Oral)  Wt 133 lb (60.328 kg)  SpO2 98%  Repeat BP reading right sitting 190/78 the left 180/78  wdwn in nad  HEENT grossly normal Neck: Supple without adenopathy or masses or bruits Chest:  Clear to A&P without wheezes rales or rhonchi CV:  S1-S2 no gallops or murmurs peripheral perfusion is normal Skin: normal capillary refill ,turgor , color: No acute rashes ,petechiae or bruising Lab Results  Component Value Date   HGBA1C 7.8* 05/30/2012       Assessment & Plan:   ACI reaction   Would not use arb at this tine as her reaction seems to involve resp tract.    Bp mod elevation at home and very much in the office  Has [facility] effect as documented in the past .neverthiless still quite high today although coming down with time.  Pt aware  .  Begin low dose norvasc and then fu  As discussed with readings   Adjust dose over the phone if needed until due for dm check.  DM up and down  Monitor  pred should not have affected a1c that much.   Fu when due with a1c etc,  Cal in meantime if  Concerns about proceeding with plan.

## 2012-07-13 ENCOUNTER — Other Ambulatory Visit: Payer: Self-pay | Admitting: Internal Medicine

## 2012-07-13 ENCOUNTER — Other Ambulatory Visit: Payer: Self-pay | Admitting: Family Medicine

## 2012-07-13 ENCOUNTER — Telehealth: Payer: Self-pay | Admitting: Internal Medicine

## 2012-07-13 MED ORDER — CARVEDILOL 3.125 MG PO TABS: 3.1250 mg | ORAL_TABLET | Freq: Two times a day (BID) | ORAL | Status: DC

## 2012-07-13 NOTE — Telephone Encounter (Signed)
Sent carvedilol to the pharmacy.  Pt notified.  She will monitor BP readings.  She will call back if any problems.

## 2012-07-13 NOTE — Telephone Encounter (Signed)
I reviewed the note that she sent about want ing to go back to metoprolol because of cost of norvasc ? 20  Per month! And concerning that causing itching .   Review of record showed change 11 11 because bp was rising   And not adequately controlled At that time and with diabetes had not been on an ace .    also there was one episode of itching and allergy rx rx with prednisone while on metoprolol but not felt from that med.  We can stop the norvasc if concerned about se and cost al;though surprised it is considered so expensive. ( it is poss the lower doses cost more)   We can go back to metoprolol but there is a  Better similar medication called carvedilol (  alpha beta blocker better in diabetes and also generic  ( Used to be on the 4$ [employer] plan)   Both are twice a day.  Let us know which way you want to proceed and after switching would ROV in a month if not already  planned

## 2012-08-20 ENCOUNTER — Ambulatory Visit: Payer: 59 | Admitting: Internal Medicine

## 2012-08-27 ENCOUNTER — Other Ambulatory Visit: Payer: Self-pay | Admitting: Internal Medicine

## 2012-08-28 ENCOUNTER — Other Ambulatory Visit: Payer: Self-pay | Admitting: Internal Medicine

## 2012-08-28 DIAGNOSIS — Z1231 Encounter for screening mammogram for malignant neoplasm of breast: Secondary | ICD-10-CM

## 2012-08-30 ENCOUNTER — Encounter: Payer: Self-pay | Admitting: Internal Medicine

## 2012-09-28 ENCOUNTER — Other Ambulatory Visit (INDEPENDENT_AMBULATORY_CARE_PROVIDER_SITE_OTHER): Payer: 59

## 2012-09-28 DIAGNOSIS — I10 Essential (primary) hypertension: Secondary | ICD-10-CM

## 2012-09-28 DIAGNOSIS — IMO0001 Reserved for inherently not codable concepts without codable children: Secondary | ICD-10-CM

## 2012-09-28 LAB — BASIC METABOLIC PANEL
BUN: 19 mg/dL (ref 6–23)
Calcium: 9.3 mg/dL (ref 8.4–10.5)
GFR: 71.73 mL/min (ref >60.00)
Potassium: 4.8 mEq/L (ref 3.5–5.1)
Sodium: 138 mEq/L (ref 135–145)

## 2012-09-28 LAB — HEMOGLOBIN A1C: Hgb A1c MFr Bld: 7.1 % — ABNORMAL HIGH (ref 4.6–6.5)

## 2012-10-02 ENCOUNTER — Ambulatory Visit
Admission: RE | Admit: 2012-10-02 | Discharge: 2012-10-02 | Disposition: A | Discharge status: Home / Self Care | Payer: PRIVATE HEALTH INSURANCE | Source: Ambulatory Visit | Attending: Internal Medicine | Admitting: Internal Medicine

## 2012-10-02 DIAGNOSIS — Z1231 Encounter for screening mammogram for malignant neoplasm of breast: Secondary | ICD-10-CM

## 2012-10-08 ENCOUNTER — Ambulatory Visit (INDEPENDENT_AMBULATORY_CARE_PROVIDER_SITE_OTHER): Payer: Self-pay | Admitting: Internal Medicine

## 2012-10-08 ENCOUNTER — Encounter: Payer: Self-pay | Admitting: Internal Medicine

## 2012-10-08 VITALS — BP 180/82 | HR 102 | Temp 98.3°F | Wt 131.0 lb

## 2012-10-08 DIAGNOSIS — IMO0001 Reserved for inherently not codable concepts without codable children: Secondary | ICD-10-CM

## 2012-10-08 DIAGNOSIS — I10 Essential (primary) hypertension: Secondary | ICD-10-CM

## 2012-10-08 DIAGNOSIS — E119 Type 2 diabetes mellitus without complications: Secondary | ICD-10-CM

## 2012-10-08 NOTE — Patient Instructions (Addendum)
Can consider  Increase metformin to 1000 mg in am and 500  In pm  To 1500 mg per day  For the diabetes control.   But can wait and Continue lifestyle intervention healthy eating and exercise .   You have  a some white coat hypertension   Is  A risk ,   Can give a trial of 1/2 of the carvedilol twice a day and then send in readings in the next 2-4  Weeks.   hga 1c in 4 month  And then OV.

## 2012-10-08 NOTE — Progress Notes (Signed)
Chief Complaint  Patient presents with  . Follow-up    labs    HPI: Patient comes in today for follow up of  multiple medical problems.  DM sugars ok  No low no se of metformin BP stopped the  norvasc and cant take acei  Got dizzy vertigo felt bad.  And also hives  Got better off med but bp readings at home are pretty good usually high 120s and 130 rang with  ocass 140 range  diastolic in 70s pulses 70 - 80  ROS: See pertinent positives and negatives per HPI. No bleeding or syncope or other neuro sx   Past Medical History  Diagnosis Date  . Diabetes mellitus   . White coat hypertension   . OA (osteoarthritis)   . Diverticulosis   . Hyperlipidemia   . Anemia     hysterectomy    Family History  Problem Relation Age of Onset  . Cancer Mother     breast    History   Social History  . Marital Status: Married    Spouse Name: N/A    Number of Children: N/A  . Years of Education: N/A   Social History Main Topics  . Smoking status: Never Smoker   . Smokeless tobacco: None  . Alcohol Use:   . Drug Use:   . Sexually Active:    Other Topics Concern  . None   Social History Narrative   Married husband no retiredhh of 2No pets    Outpatient Encounter Prescriptions as of 10/08/2012  Medication Sig Dispense Refill  . aspirin 81 MG tablet Take 81 mg by mouth daily.      . Calcium Carbonate-Vitamin D (OSCAL 500/200 D-3 PO) Take by mouth.      . metFORMIN (GLUCOPHAGE) 500 MG tablet TAKE ONE TABLET BY MOUTH TWICE DAILY WITH FOOD  180 tablet  0  . pravastatin (PRAVACHOL) 40 MG tablet Take 2 tablets (80 mg total) by mouth daily.  180 tablet  2  . carvedilol (COREG) 3.125 MG tablet Take 1 tablet (3.125 mg total) by mouth 2 (two) times daily with a meal. Start with  1/2 bid .  60 tablet  1  . [DISCONTINUED] amLODipine (NORVASC) 2.5 MG tablet Take 1 tablet (2.5 mg total) by mouth daily.  30 tablet  6  . [DISCONTINUED] carvedilol (COREG) 3.125 MG tablet Take 1 tablet (3.125 mg total)  by mouth 2 (two) times daily with a meal.  60 tablet  1  . [DISCONTINUED] predniSONE (DELTASONE) 10 MG tablet Take pills per day,6,6,6,4,4,4,2,2,2,1,1,1  40 tablet  0    EXAM:  BP 180/82  Pulse 102  Temp 98.3 F (36.8 C) (Oral)  Wt 131 lb (59.421 kg)  SpO2 98%  There is no height on file to calculate BMI. BP  Readings right  170/80    And 190/82 GENERAL: vitals reviewed and listed above, alert, oriented, appears well hydrated and in no acute distress  HEENT: atraumatic, conjunctiva  clear, no obvious abnormalities on inspection of external nose and ears  NECK: no obvious masses on inspection palpation  CV: HRRR, no clubbing cyanosis   nl cap refill  MS: moves all extremities without noticeable focal  Abnormality. PSYCH: pleasant and cooperative, no obvious depression or anxiety Lab Results  Component Value Date   WBC 4.7 02/24/2012   HGB 13.1 02/24/2012   HCT 38.9 02/24/2012   PLT 196.0 02/24/2012   GLUCOSE 156* 09/28/2012   CHOL 183 02/24/2012   TRIG  154.0* 02/24/2012   HDL 40.20 02/24/2012   LDLDIRECT 99.6 02/12/2010   LDLCALC 112* 02/24/2012   ALT 13 02/24/2012   AST 18 02/24/2012   NA 138 09/28/2012   K 4.8 09/28/2012   CL 102 09/28/2012   CREATININE 0.8 09/28/2012   BUN 19 09/28/2012   CO2 32 09/28/2012   TSH 2.22 02/24/2012   HGBA1C 7.1* 09/28/2012   MICROALBUR 0.8 02/24/2012    ASSESSMENT AND PLAN:  Discussed the following assessment and plan:  1. White coat hypertension    pretty dramatic readings and  correlate with pt machine  only ocass borderline high at home. will try low dose carvedilol; able to tolerate lopressor  2. DIABETES MELLITUS, TYPE II    adequate control consider inc metformin but pt wasnt to woork on [facility] at this time  3. HYPERTENSION     -Patient advised to return or notify health care team  immediately if symptoms worsen or persist or new concerns arise.  Patient Instructions  Can consider  Increase metformin to 1000 mg in am and 500  In  pm  To 1500 mg per day  For the diabetes control.   But can wait and Continue lifestyle intervention healthy eating and exercise .   You have  a some white coat hypertension   Is  A risk ,   Can give a trial of 1/2 of the carvedilol twice a day and then send in readings in the next 2-4  Weeks.   hga 1c in 4 month  And then OV.   Neta Mends. Adrieana Fennelly M.D.

## 2012-11-29 ENCOUNTER — Other Ambulatory Visit: Payer: Self-pay | Admitting: Internal Medicine

## 2012-11-30 ENCOUNTER — Encounter: Payer: Self-pay | Admitting: Gastroenterology

## 2012-12-09 ENCOUNTER — Other Ambulatory Visit: Payer: Self-pay | Admitting: Internal Medicine

## 2013-02-01 ENCOUNTER — Other Ambulatory Visit (INDEPENDENT_AMBULATORY_CARE_PROVIDER_SITE_OTHER): Payer: PRIVATE HEALTH INSURANCE

## 2013-02-01 DIAGNOSIS — Z0000 Encounter for general adult medical examination without abnormal findings: Secondary | ICD-10-CM

## 2013-02-01 DIAGNOSIS — E785 Hyperlipidemia, unspecified: Secondary | ICD-10-CM

## 2013-02-01 DIAGNOSIS — E119 Type 2 diabetes mellitus without complications: Secondary | ICD-10-CM

## 2013-02-01 LAB — CBC WITH DIFFERENTIAL/PLATELET
Basophils Absolute: 0 10*3/uL (ref 0.0–0.1)
Eosinophils Absolute: 0.4 10*3/uL (ref 0.0–0.7)
Hemoglobin: 13.3 g/dL (ref 12.0–15.0)
Lymphocytes Relative: 17 % (ref 12.0–46.0)
Monocytes Relative: 7 % (ref 3.0–12.0)
Neutro Abs: 3.3 10*3/uL (ref 1.4–7.7)
Neutrophils Relative %: 68.3 % (ref 43.0–77.0)
RBC: 4.42 Mil/uL (ref 3.87–5.11)
RDW: 13.1 % (ref 11.5–14.6)

## 2013-02-01 LAB — BASIC METABOLIC PANEL
CO2: 30 mEq/L (ref 19–32)
Chloride: 101 mEq/L (ref 96–112)
Creatinine, Ser: 0.9 mg/dL (ref 0.4–1.2)
Potassium: 4.1 mEq/L (ref 3.5–5.1)

## 2013-02-01 LAB — TSH: TSH: 2.62 u[IU]/mL (ref 0.35–5.50)

## 2013-02-01 LAB — MICROALBUMIN / CREATININE URINE RATIO
Creatinine,U: 231 mg/dL
Microalb Creat Ratio: 0.7 mg/g (ref 0.0–30.0)

## 2013-02-01 LAB — LIPID PANEL
LDL Cholesterol: 88 mg/dL (ref 0–99)
Total CHOL/HDL Ratio: 4

## 2013-02-01 LAB — HEPATIC FUNCTION PANEL
Bilirubin, Direct: 0.1 mg/dL (ref 0.0–0.3)
Total Bilirubin: 0.8 mg/dL (ref 0.3–1.2)

## 2013-02-01 NOTE — Addendum Note (Signed)
Addended by: Rita Ohara R on: 02/01/2013 08:22 AM   Modules accepted: Orders

## 2013-02-08 ENCOUNTER — Ambulatory Visit (INDEPENDENT_AMBULATORY_CARE_PROVIDER_SITE_OTHER): Payer: PRIVATE HEALTH INSURANCE | Admitting: Internal Medicine

## 2013-02-08 ENCOUNTER — Encounter: Payer: Self-pay | Admitting: Internal Medicine

## 2013-02-08 VITALS — BP 160/80 | HR 78 | Temp 98.5°F | Ht 62.0 in | Wt 133.0 lb

## 2013-02-08 DIAGNOSIS — E785 Hyperlipidemia, unspecified: Secondary | ICD-10-CM

## 2013-02-08 DIAGNOSIS — IMO0001 Reserved for inherently not codable concepts without codable children: Secondary | ICD-10-CM

## 2013-02-08 DIAGNOSIS — Z0000 Encounter for general adult medical examination without abnormal findings: Secondary | ICD-10-CM

## 2013-02-08 DIAGNOSIS — I10 Essential (primary) hypertension: Secondary | ICD-10-CM

## 2013-02-08 DIAGNOSIS — E119 Type 2 diabetes mellitus without complications: Secondary | ICD-10-CM

## 2013-02-08 MED ORDER — LOVASTATIN 40 MG PO TABS: 40.0000 mg | ORAL_TABLET | Freq: Every day | ORAL | Status: DC

## 2013-02-08 MED ORDER — METFORMIN HCL 500 MG PO TABS: ORAL_TABLET | ORAL | Status: DC

## 2013-02-08 MED ORDER — CARVEDILOL 3.125 MG PO TABS: ORAL_TABLET | ORAL | Status: DC

## 2013-02-08 NOTE — Progress Notes (Signed)
Chief Complaint  Patient presents with  . Medicare Wellness    Needs a new rx for pravastatin.  It is no longer covered on her insurance.    HPI: Patient comes in today for Preventive Medicare wellness visit . No major injuries, ed visits ,hospitalizations , new medications since last visit.  Insurance    Says more  Cost for  Pravastatin  and to consider trying  lovastatin  .  She doesn't think she's been on the nose she has had a history of side effects of certain statins and her Medipass but doesn't remember the name.  Taking blood pressure medication has no significant side effects has a computer printout of all of her blood pressure readings that she is monitored in the last 4 months. Over the last 2 months only to low pressure readings were 140 or above. The rest were all at goal. She has had her machine checked by our office in the past. Blood pressure this morning was 131/71 with a pulse of 69 Gets regular eye checks last one 11 2013 no retinopathy    Hearing:  Ok   Vision:  No limitations at present . Last eye check UTD  Safety:  Has smoke detector and wears seat belts.  No firearms. No excess sun exposure. Sees dentist regularly.  Falls: No  Advance directive :  Reviewed  . Doesn't have one but is aware  Memory: Felt to be good  , no concern from her or her family.  Depression: No anhedonia unusual crying or depressive symptoms  Nutrition: Eats well balanced diet; adequate calcium and vitamin D. No swallowing chewing problems.  Injury: no major injuries in the last six months.  Other healthcare providers:  Reviewed today .  Social:  Lives with spouse married. No pets.   Preventive parameters: up-to-date  Reviewed   ADLS:   There are no problems or need for assistance  driving, feeding, obtaining food, dressing, toileting and bathing, managing money using phone. She is independent.  EXERCISE/ HABITS  Per week  not exercising as much use to walk 2-5 miles but husband  didn't want her to be walking by herself likes to walk No tobacco    etoh   ROS:  GEN/ HEENT: No fever, significant weight changes sweats headaches vision problems hearing changes, CV/ PULM; No chest pain shortness of breath cough, syncope,edema  change in exercise tolerance. GI /GU: No adominal pain, vomiting, change in bowel habits. No blood in the stool. No significant GU symptoms. SKIN/HEME: ,no acute skin rashes suspicious lesions or bleeding. No lymphadenopathy, nodules, masses.  NEURO/ PSYCH:  No neurologic signs such as weakness numbness. No depression anxiety. IMM/ Allergy: No unusual infections.  Allergy .   REST of 12 system review negative except as per HPI   Past Medical History  Diagnosis Date  . Diabetes mellitus   . White coat hypertension   . OA (osteoarthritis)   . Diverticulosis   . Hyperlipidemia   . Anemia     hysterectomy    Family History  Problem Relation Age of Onset  . Cancer Mother     breast    History   Social History  . Marital Status: Married    Spouse Name: N/A    Number of Children: N/A  . Years of Education: N/A   Social History Main Topics  . Smoking status: Never Smoker   . Smokeless tobacco: None  . Alcohol Use:   . Drug Use:   .  Sexually Active:    Other Topics Concern  . None   Social History Narrative   Married husband no retired   hh of 2   No pets    Outpatient Encounter Prescriptions as of 02/08/2013  Medication Sig Dispense Refill  . aspirin 81 MG tablet Take 81 mg by mouth daily.      . Calcium Carbonate-Vitamin D (OSCAL 500/200 D-3 PO) Take by mouth.      . carvedilol (COREG) 3.125 MG tablet TAKE ONE TABLET BY MOUTH TWICE DAILY WITH A MEAL.  180 tablet  3  . metFORMIN (GLUCOPHAGE) 500 MG tablet TAKE ONE TABLET BY MOUTH TWICE DAILY WITH FOOD  180 tablet  11  . pravastatin (PRAVACHOL) 40 MG tablet Take 2 tablets (80 mg total) by mouth daily.  180 tablet  2  . [DISCONTINUED] carvedilol (COREG) 3.125 MG tablet TAKE  ONE TABLET BY MOUTH TWICE DAILY WITH A MEAL.  60 tablet  1  . [DISCONTINUED] metFORMIN (GLUCOPHAGE) 500 MG tablet TAKE ONE TABLET BY MOUTH TWICE DAILY WITH FOOD  180 tablet  0  . lovastatin (MEVACOR) 40 MG tablet Take 1 tablet (40 mg total) by mouth at bedtime.  90 tablet  3  . [DISCONTINUED] carvedilol (COREG) 3.125 MG tablet Take 1 tablet (3.125 mg total) by mouth 2 (two) times daily with a meal. Start with  1/2 bid .  60 tablet  1   No facility-administered encounter medications on file as of 02/08/2013.    EXAM:  BP 160/80  Pulse 78  Temp(Src) 98.5 F (36.9 C) (Oral)  Ht 5\' 2"  (1.575 m)  Wt 133 lb (60.328 kg)  BMI 24.32 kg/m2  SpO2 97%  Body mass index is 24.32 kg/(m^2).  Physical Exam: Vital signs reviewed [id number] is a well-developed well-nourished alert cooperative   who appears stated age in no acute distress.  HEENT: normocephalic atraumatic , Eyes: PERRL EOM's full, conjunctiva clear, Nares: paten,t no deformity discharge or tenderness., Ears: no deformity EAC's clear TMs with normal landmarks. Mouth: clear OP, no lesions, edema.  Moist mucous membranes. Dentition in adequate repair. NECK: supple without masses, thyromegaly or bruits. CHEST/PULM:  Clear to auscultation and percussion breath sounds equal no wheeze , rales or rhonchi. No chest wall deformities or tenderness. Breast: normal by inspection . No dimpling, discharge, masses, tenderness or discharge . CV: PMI is nondisplaced, S1 S2 no gallops, murmurs, rubs. Peripheral pulses are full without delay.No JVD .  ABDOMEN: Bowel sounds normal nontender  No guard or rebound, no hepato splenomegal no CVA tenderness.  No hernia. Extremtities:  No clubbing cyanosis or edema, no acute joint swelling or redness no focal atrophy NEURO:  Oriented x3, cranial nerves 3-12 appear to be intact, no obvious focal weakness,gait within normal limits no abnormal reflexes or asymmetrical SKIN: No acute rashes normal turgor, color, no  bruising or petechiae. PSYCH: Oriented, good eye contact, no obvious depression anxiety, cognition and judgment appear normal. LN: no cervical axillary inguinal adenopathy No noted deficits in memory, attention, and speech.   Lab Results  Component Value Date   WBC 4.8 02/01/2013   HGB 13.3 02/01/2013   HCT 39.3 02/01/2013   PLT 196.0 02/01/2013   GLUCOSE 154* 02/01/2013   CHOL 151 02/01/2013   TRIG 127.0 02/01/2013   HDL 37.20* 02/01/2013   LDLDIRECT 99.6 02/12/2010   LDLCALC 88 02/01/2013   ALT 16 02/01/2013   AST 16 02/01/2013   NA 138 02/01/2013   K 4.1 02/01/2013  CL 101 02/01/2013   CREATININE 0.9 02/01/2013   BUN 16 02/01/2013   CO2 30 02/01/2013   TSH 2.62 02/01/2013   HGBA1C 7.3* 02/01/2013   MICROALBUR 1.6 02/01/2013    ASSESSMENT AND PLAN:  Discussed the following assessment and plan:  Visit for preventive health examination  HYPERLIPIDEMIA - insurance cost inc for pravachol only low cost in loavastatin hx of se of other statins but pat doesnt remember wich ones. will d trial for now  HYPERTENSION  White coat hypertension  effect. - great readings at home   reviewed today  manchine has been checked in the past.  DIABETES MELLITUS, TYPE II - uncomplicated at present  control could be better  but accpetable for age   add exercise continue metformin  Medicare annual wellness visit, subsequent  Patient Care Team: Madelin Headings, MD as PCP - General Mardella Layman, MD as Attending Physician (Gastroenterology) Lamarr Lulas, MD as Attending Physician (Ophthalmology)  Patient Instructions  Continue lifestyle intervention healthy eating and exercise . Add exercise    Continue  Blood pressure monitoring .   To make sure in range .  Change  Cholesterol medication as we discussed .  Check labs lipids and hg a1c in 3 months and then OV if possible.  Or as needed.        Neta Mends. Panosh M.D.  Health Maintenance  Topic Date Due  . Influenza Vaccine  07/08/2013   . Colonoscopy  10/07/2017  . Tetanus/tdap  08/22/2019  . Pneumococcal Polysaccharide Vaccine Age 20 And Over  Completed  . Zostavax  Completed   Health Maintenance Review

## 2013-02-08 NOTE — Patient Instructions (Signed)
Continue lifestyle intervention healthy eating and exercise . Add exercise    Continue  Blood pressure monitoring .   To make sure in range .  Change  Cholesterol medication as we discussed .  Check labs lipids and hg a1c in 3 months and then OV if possible.  Or as needed.

## 2013-02-11 ENCOUNTER — Other Ambulatory Visit: Payer: Self-pay | Admitting: Family Medicine

## 2013-02-11 MED ORDER — LOVASTATIN 20 MG PO TABS: 40.0000 mg | ORAL_TABLET | Freq: Every day | ORAL | Status: DC

## 2013-04-04 LAB — HM DIABETES EYE EXAM

## 2013-04-05 ENCOUNTER — Encounter: Payer: Self-pay | Admitting: Internal Medicine

## 2013-04-15 ENCOUNTER — Encounter: Payer: Self-pay | Admitting: *Deleted

## 2013-05-17 ENCOUNTER — Other Ambulatory Visit: Payer: PRIVATE HEALTH INSURANCE

## 2013-05-24 ENCOUNTER — Ambulatory Visit: Payer: PRIVATE HEALTH INSURANCE | Admitting: Internal Medicine

## 2013-06-04 ENCOUNTER — Encounter: Payer: Self-pay | Admitting: Internal Medicine

## 2013-06-04 ENCOUNTER — Ambulatory Visit (INDEPENDENT_AMBULATORY_CARE_PROVIDER_SITE_OTHER): Payer: PRIVATE HEALTH INSURANCE | Admitting: Internal Medicine

## 2013-06-04 VITALS — BP 210/90 | HR 76 | Temp 98.3°F | Wt 135.0 lb

## 2013-06-04 DIAGNOSIS — M238X9 Other internal derangements of unspecified knee: Secondary | ICD-10-CM

## 2013-06-04 DIAGNOSIS — S99919A Unspecified injury of unspecified ankle, initial encounter: Secondary | ICD-10-CM

## 2013-06-04 DIAGNOSIS — S8991XA Unspecified injury of right lower leg, initial encounter: Secondary | ICD-10-CM

## 2013-06-04 DIAGNOSIS — M25361 Other instability, right knee: Secondary | ICD-10-CM

## 2013-06-04 NOTE — Patient Instructions (Addendum)
  Sounds like an injury related to the bowling    Because of the instability.    See sm or ortho.  appt tomorrow dr Cleophas Dunker  .

## 2013-06-04 NOTE — Progress Notes (Signed)
Chief Complaint  Patient presents with  . Knee Pain    Was bowling 2 weeks ago and believes she may have twisted it.    HPI: sda appt  .  About 9 days ago was bowling and turned around and knee gave out  And some pain  She didn't hear a pop but another person thought they did. Since then knee is not normal  Gave out when rushing around in house. But didn't sustain other injury  . Knee feels uncertain  When steps.   No rx    So far  Feels unsteady with the knee  ROS: See pertinent positives and negatives per HPI. No rx  No fever systemic sx   Going out of town in for 2 weeks  In 3 days vacation.   Past Medical History  Diagnosis Date  . Diabetes mellitus   . White coat hypertension   . OA (osteoarthritis)   . Diverticulosis   . Hyperlipidemia   . Anemia     hysterectomy    Family History  Problem Relation Age of Onset  . Cancer Mother     breast    History   Social History  . Marital Status: Married    Spouse Name: N/A    Number of Children: N/A  . Years of Education: N/A   Social History Main Topics  . Smoking status: Never Smoker   . Smokeless tobacco: None  . Alcohol Use:   . Drug Use:   . Sexually Active:    Other Topics Concern  . None   Social History Narrative   Married husband no retired   hh of 2   No pets    Outpatient Encounter Prescriptions as of 06/04/2013  Medication Sig Dispense Refill  . aspirin 81 MG tablet Take 81 mg by mouth daily.      . Calcium Carbonate-Vitamin D (OSCAL 500/200 D-3 PO) Take by mouth.      . carvedilol (COREG) 3.125 MG tablet TAKE ONE TABLET BY MOUTH TWICE DAILY WITH A MEAL.  180 tablet  3  . metFORMIN (GLUCOPHAGE) 500 MG tablet TAKE ONE TABLET BY MOUTH TWICE DAILY WITH FOOD  180 tablet  11  . pravastatin (PRAVACHOL) 40 MG tablet Take 2 tablets (80 mg total) by mouth daily.  180 tablet  2  . [DISCONTINUED] lovastatin (MEVACOR) 20 MG tablet Take 2 tablets (40 mg total) by mouth at bedtime.  90 tablet  3   No  facility-administered encounter medications on file as of 06/04/2013.    EXAM:  BP 210/90  Pulse 76  Temp(Src) 98.3 F (36.8 C) (Oral)  Wt 135 lb (61.236 kg)  BMI 24.69 kg/m2  SpO2 99%  Body mass index is 24.69 kg/(m^2).  GENERAL: vitals reviewed and listed above, alert, oriented, appears well hydrated and in no acute distress limping gait  But independent  MS: moves all extremities   Right knee with some enlargement   But 1 + effusion no tender  Fullness in popliteal fossa  Good rom  ? Laxity no point tenderness  PSYCH: pleasant and cooperative, no obvious depression or anxiety  ASSESSMENT AND PLAN:  Discussed the following assessment and plan:  Knee injury, right, initial encounter - Plan: Ambulatory referral to Orthopedic Surgery  Knee instability, right ? Concern about    Internal derangement    .   Get ortho sm  before her  2 weeks trip   Ice int he meantime.   Referral arrange while  patient in office .  -Patient advised to return or notify health care team  if symptoms worsen or persist or new concerns arise.  Patient Instructions   Sounds like an injury related to the bowling    Because of the instability.    See sm or ortho.  appt tomorrow dr Cleophas Dunker  .       Neta Mends. Allannah Kempen M.D.

## 2013-06-21 ENCOUNTER — Other Ambulatory Visit: Payer: PRIVATE HEALTH INSURANCE

## 2013-06-28 ENCOUNTER — Ambulatory Visit: Payer: PRIVATE HEALTH INSURANCE | Admitting: Internal Medicine

## 2013-07-03 ENCOUNTER — Encounter: Payer: Self-pay | Admitting: Gastroenterology

## 2013-07-05 ENCOUNTER — Other Ambulatory Visit (INDEPENDENT_AMBULATORY_CARE_PROVIDER_SITE_OTHER): Payer: Medicare Other

## 2013-07-05 DIAGNOSIS — E1110 Type 2 diabetes mellitus with ketoacidosis without coma: Secondary | ICD-10-CM

## 2013-07-05 DIAGNOSIS — E785 Hyperlipidemia, unspecified: Secondary | ICD-10-CM

## 2013-07-05 DIAGNOSIS — E1310 Other specified diabetes mellitus with ketoacidosis without coma: Secondary | ICD-10-CM

## 2013-07-05 LAB — LIPID PANEL
LDL Cholesterol: 81 mg/dL (ref 0–99)
Total CHOL/HDL Ratio: 4
Triglycerides: 158 mg/dL — ABNORMAL HIGH (ref 0.0–149.0)

## 2013-07-05 LAB — HEMOGLOBIN A1C: Hgb A1c MFr Bld: 7 % — ABNORMAL HIGH (ref 4.6–6.5)

## 2013-07-12 ENCOUNTER — Ambulatory Visit (INDEPENDENT_AMBULATORY_CARE_PROVIDER_SITE_OTHER): Payer: Medicare Other | Admitting: Internal Medicine

## 2013-07-12 ENCOUNTER — Encounter: Payer: Self-pay | Admitting: Internal Medicine

## 2013-07-12 VITALS — BP 200/78 | HR 83 | Temp 98.1°F | Wt 131.0 lb

## 2013-07-12 DIAGNOSIS — IMO0001 Reserved for inherently not codable concepts without codable children: Secondary | ICD-10-CM

## 2013-07-12 DIAGNOSIS — Z872 Personal history of diseases of the skin and subcutaneous tissue: Secondary | ICD-10-CM

## 2013-07-12 DIAGNOSIS — E119 Type 2 diabetes mellitus without complications: Secondary | ICD-10-CM

## 2013-07-12 DIAGNOSIS — E785 Hyperlipidemia, unspecified: Secondary | ICD-10-CM

## 2013-07-12 DIAGNOSIS — T788XXS Other adverse effects, not elsewhere classified, sequela: Secondary | ICD-10-CM

## 2013-07-12 DIAGNOSIS — I10 Essential (primary) hypertension: Secondary | ICD-10-CM

## 2013-07-12 MED ORDER — PRAVASTATIN SODIUM 40 MG PO TABS: 80.0000 mg | ORAL_TABLET | Freq: Every day | ORAL | Status: DC

## 2013-07-12 MED ORDER — CARVEDILOL 6.25 MG PO TABS: ORAL_TABLET | ORAL | Status: DC

## 2013-07-12 NOTE — Progress Notes (Signed)
Chief Complaint  Patient presents with  . Follow-up  . Diabetes  . Hyperlipidemia    HPI: Patient comes in today for follow up of  multiple medical problems.   Blood pressure brings in a log since April of various readings. Since with a monitor that has been uses and has been checked in our office the majority of them are under 140 systolic however someone 50s and mid 144. Pulses are usually in the 70s to 80s. She was on low-dose carvedilol but because it didn't seem to make a difference in her blood pressure she stopped up for while and went back on at half dose. She had a rough summer with root canals and other issues going on.  Otherwise she feels fine no chest pain shortness of breath syncope swelling. Doesn't know why her blood pressure go so high in the office she doesn't feel that nervous  Was 155 this am  Usually 118 120 other times.  ? Hives  And whole dose and helped .    still gets occasional hives different parts of her body about every few weeks  No other se of med  No vision change numbness or syncope.    We tried lovastatin for her cholesterol but she got leg cramps so she began taking her husband's Pravachol. The Pravachol generic is very expensive however she is taking 40 mg 2 a day less expensive than 80 mg. They're to lose her insurance at the end of the year and don't know what the future insurance will show. ROS: See pertinent positives and negatives per HPI. No history of tick bite before hives began. No associated food intakes.  Past Medical History  Diagnosis Date  . Diabetes mellitus   . White coat hypertension   . OA (osteoarthritis)   . Diverticulosis   . Hyperlipidemia   . Anemia     hysterectomy    Family History  Problem Relation Age of Onset  . Cancer Mother     breast    History   Social History  . Marital Status: Married    Spouse Name: N/A    Number of Children: N/A  . Years of Education: N/A   Social History Main Topics  . Smoking  status: Never Smoker   . Smokeless tobacco: None  . Alcohol Use:   . Drug Use:   . Sexual Activity:    Other Topics Concern  . None   Social History Narrative   Married husband no retired   hh of 2   No pets    Outpatient Encounter Prescriptions as of 07/12/2013  Medication Sig Dispense Refill  . aspirin 81 MG tablet Take 81 mg by mouth daily.      . Calcium Carbonate-Vitamin D (OSCAL 500/200 D-3 PO) Take by mouth.      . carvedilol (COREG) 6.25 MG tablet TAKE ONE TABLET BY MOUTH TWICE DAILY WITH A MEAL.  180 tablet  3  . meloxicam (MOBIC) 7.5 MG tablet Take 7.5 mg by mouth daily.      . metFORMIN (GLUCOPHAGE) 500 MG tablet TAKE ONE TABLET BY MOUTH TWICE DAILY WITH FOOD  180 tablet  11  . pravastatin (PRAVACHOL) 40 MG tablet Take 2 tablets (80 mg total) by mouth daily.  180 tablet  2  . [DISCONTINUED] carvedilol (COREG) 3.125 MG tablet TAKE ONE TABLET BY MOUTH TWICE DAILY WITH A MEAL.  180 tablet  3  . [DISCONTINUED] pravastatin (PRAVACHOL) 40 MG tablet Take 2 tablets (80  mg total) by mouth daily.  180 tablet  2   No facility-administered encounter medications on file as of 07/12/2013.    EXAM:  BP 200/78  Pulse 83  Temp(Src) 98.1 F (36.7 C) (Oral)  Wt 131 lb (59.421 kg)  BMI 23.95 kg/m2  SpO2 98%  Body mass index is 23.95 kg/(m^2).  GENERAL: vitals reviewed and listed above, alert, oriented, appears well hydrated and in no acute distress younger than stated age.  HEENT: atraumatic, conjunctiva  clear, no obvious abnormalities on inspection of external nose and ears NECK: no obvious masses on inspection palpation LUNGS: clear to auscultation bilaterally, no wheezes, rales or rhonchi, good air movement CV: HRRR, no clubbing cyanosis or  peripheral edema nl cap refill  MS: moves all extremities without noticeable focal  abnormality PSYCH: pleasant and cooperative, no obvious depression or anxiety Lab Results  Component Value Date   WBC 4.8 02/01/2013   HGB 13.3 02/01/2013    HCT 39.3 02/01/2013   PLT 196.0 02/01/2013   GLUCOSE 154* 02/01/2013   CHOL 151 07/05/2013   TRIG 158.0* 07/05/2013   HDL 38.00* 07/05/2013   LDLDIRECT 99.6 02/12/2010   LDLCALC 81 07/05/2013   ALT 16 02/01/2013   AST 16 02/01/2013   NA 138 02/01/2013   K 4.1 02/01/2013   CL 101 02/01/2013   CREATININE 0.9 02/01/2013   BUN 16 02/01/2013   CO2 30 02/01/2013   TSH 2.62 02/01/2013   HGBA1C 7.0* 07/05/2013   MICROALBUR 1.6 02/01/2013    ASSESSMENT AND PLAN:  Discussed the following assessment and plan:  White coat hypertension  effect. 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 cause  Pt will decide on allergy eval for hives . Off bp med still uncomfortable  With level of bp elevation in office  Even if wc  She was on such a low dose of med that cant say didn't help.  Try 6.25  Bid this is still low dose would have her send in her readings to my chart and make decisions. She can take an ACE inhibitor.  -Patient advised to return or notify health care team  if symptoms worsen or persist or new concerns arise.  Patient Instructions  Continue monitoring  bp readings as you are doing  Try higher dose of carvedilol  6.25 twice a day which is still a low dose.  More typical is 12.5 twice a day. Get your flu vaccine when available.  Will refill pravachol.  Labs and hg a1c BMP  And urin microalbumin  in 3 months . And then ROV .    Neta Mends. Modell Fendrick M.D.

## 2013-07-12 NOTE — Patient Instructions (Addendum)
Continue monitoring  bp readings as you are doing  Try higher dose of carvedilol  6.25 twice a day which is still a low dose.  More typical is 12.5 twice a day. Get your flu vaccine when available.  Will refill pravachol.  Labs and hg a1c BMP  And urin microalbumin  in 3 months . And then ROV .

## 2013-08-01 ENCOUNTER — Telehealth: Payer: Self-pay | Admitting: Gastroenterology

## 2013-08-01 NOTE — Telephone Encounter (Signed)
Spoke with patient and told her I will have Dr. Jarold Motto review her chart and be sure she is not to have a colonoscopy recall. Per recall sheet (under media) on 02/23/12 patient did not need to have a recall. Patient is aware that MD is out of the office for next few weeks. Please, advise.

## 2013-08-05 NOTE — Telephone Encounter (Signed)
Pt called to ask if she should keep her appt with PV today. Explained to pt that Dr Jarold Motto will be back tomorrow; I will cancel the PV until I have spoken to him. Pt's procedure is scheduled for 08/21/13, so we have time to r/s another PV. Pt stated understanding.

## 2013-08-06 NOTE — Telephone Encounter (Signed)
Spoke with pt and she is aware. Appt cancelled. 

## 2013-08-06 NOTE — Telephone Encounter (Signed)
Not needed

## 2013-08-19 ENCOUNTER — Encounter: Payer: PRIVATE HEALTH INSURANCE | Admitting: Gastroenterology

## 2013-08-21 ENCOUNTER — Encounter: Payer: PRIVATE HEALTH INSURANCE | Admitting: Gastroenterology

## 2013-10-18 ENCOUNTER — Other Ambulatory Visit: Payer: Medicare Other

## 2013-10-30 ENCOUNTER — Ambulatory Visit: Payer: Medicare Other | Admitting: Internal Medicine

## 2013-12-06 ENCOUNTER — Other Ambulatory Visit (INDEPENDENT_AMBULATORY_CARE_PROVIDER_SITE_OTHER): Payer: Medicare Other

## 2013-12-06 DIAGNOSIS — E119 Type 2 diabetes mellitus without complications: Secondary | ICD-10-CM

## 2013-12-06 LAB — MICROALBUMIN / CREATININE URINE RATIO
Creatinine,U: 220.1 mg/dL
Microalb Creat Ratio: 1.6 mg/g (ref 0.0–30.0)
Microalb, Ur: 3.5 mg/dL — ABNORMAL HIGH (ref 0.0–1.9)

## 2013-12-06 LAB — BASIC METABOLIC PANEL
BUN: 14 mg/dL (ref 6–23)
CALCIUM: 9.5 mg/dL (ref 8.4–10.5)
CHLORIDE: 101 meq/L (ref 96–112)
CO2: 30 meq/L (ref 19–32)
CREATININE: 0.8 mg/dL (ref 0.4–1.2)
GFR: 70.51 mL/min (ref >60.00)
Glucose, Bld: 145 mg/dL — ABNORMAL HIGH (ref 70–99)
Potassium: 3.9 mEq/L (ref 3.5–5.1)
Sodium: 137 mEq/L (ref 135–145)

## 2013-12-06 LAB — HEMOGLOBIN A1C: HEMOGLOBIN A1C: 7.6 % — AB (ref 4.6–6.5)

## 2013-12-13 ENCOUNTER — Encounter: Payer: Self-pay | Admitting: Internal Medicine

## 2013-12-13 ENCOUNTER — Ambulatory Visit (INDEPENDENT_AMBULATORY_CARE_PROVIDER_SITE_OTHER): Payer: Medicare Other | Admitting: Internal Medicine

## 2013-12-13 VITALS — BP 200/98 | Temp 98.6°F | Ht 62.0 in | Wt 136.0 lb

## 2013-12-13 DIAGNOSIS — I10 Essential (primary) hypertension: Secondary | ICD-10-CM

## 2013-12-13 DIAGNOSIS — E119 Type 2 diabetes mellitus without complications: Secondary | ICD-10-CM

## 2013-12-13 DIAGNOSIS — IMO0001 Reserved for inherently not codable concepts without codable children: Secondary | ICD-10-CM

## 2013-12-13 DIAGNOSIS — E785 Hyperlipidemia, unspecified: Secondary | ICD-10-CM

## 2013-12-13 DIAGNOSIS — Z23 Encounter for immunization: Secondary | ICD-10-CM

## 2013-12-13 DIAGNOSIS — Z888 Allergy status to other drugs, medicaments and biological substances status: Secondary | ICD-10-CM

## 2013-12-13 NOTE — Patient Instructions (Addendum)
Continue monitoring  Your BP readings on a regular basis .   And  if elevated at home contact us . Send in readings after a month via my chart.  Intensify lifestyle interventions. consider increasing metformin to 3 per day ( total 1500 mg per day) . prevnar 13 today

## 2013-12-13 NOTE — Progress Notes (Signed)
Chief Complaint  Patient presents with  . Follow-up    Labs  . Diabetes  . Hypertension    HPI:  Patient comes in today for follow up of  multiple medical problems.   BP; [place]  122/67 this am and continued this am    Then 170 right before coming.   On jan 1 was 150  Under stress.  Checks daily and usually controlled .  computor broke so doesn't have the read out.  bp machine has been checked here and with husbands readings doesn't see that much different .  Baseline bp in 120 /ocass lower   DM: holidays   Lots of stress  And didn't  Pay attention  Had knee injury  And then cold stopped paying attention to eating   Aching  At times   Upper an lower.  ? Arthritis or other   No vision feet change neuro sx .    ROS: See pertinent positives and negatives per HPI. No cp sob some anxiety and stress.  Past Medical History  Diagnosis Date  . Diabetes mellitus   . White coat hypertension   . OA (osteoarthritis)   . Diverticulosis   . Hyperlipidemia   . Anemia     hysterectomy    Family History  Problem Relation Age of Onset  . Cancer Mother     breast    History   Social History  . Marital Status: Married    Spouse Name: N/A    Number of Children: N/A  . Years of Education: N/A   Social History Main Topics  . Smoking status: Never Smoker   . Smokeless tobacco: None  . Alcohol Use:   . Drug Use:   . Sexual Activity:    Other Topics Concern  . None   Social History Narrative   Married husband no retired   hh of 2   No pets    Outpatient Encounter Prescriptions as of 12/13/2013  Medication Sig  . aspirin 81 MG tablet Take 81 mg by mouth daily.  . Calcium Carbonate-Vitamin D (OSCAL 500/200 D-3 PO) Take by mouth.  . carvedilol (COREG) 6.25 MG tablet TAKE ONE TABLET BY MOUTH TWICE DAILY WITH A MEAL.  . metFORMIN (GLUCOPHAGE) 500 MG tablet TAKE ONE TABLET BY MOUTH TWICE DAILY WITH FOOD  . pravastatin (PRAVACHOL) 40 MG tablet Take 2 tablets (80 mg total) by mouth  daily.  . [DISCONTINUED] meloxicam (MOBIC) 7.5 MG tablet Take 7.5 mg by mouth daily.    EXAM:  BP 200/98  Temp(Src) 98.6 F (37 C) (Oral)  Ht 5\' 2"  (1.575 m)  Wt 136 lb (61.689 kg)  BMI 24.87 kg/m2  Body mass index is 24.87 kg/(m^2).  GENERAL: vitals reviewed and listed above, alert, oriented, appears well hydrated and in no acute distress HEENT: atraumatic, conjunctiva  clear, no obvious abnormalities on inspection of external nose and ears OP : no lesion edema or exudate  NECK: no obvious masses on inspection palpation  LUNGS: clear to auscultation bilaterally, no wheezes, rales or rhonchi, good air movement CV: HRRR, no clubbing cyanosis or  peripheral edema nl cap refill  MS: moves all extremities without noticeable focal  abnormality PSYCH: pleasant and cooperative, no obvious depression minimal anxious Foot exam normal  Lab Results  Component Value Date   HGBA1C 7.6* 12/06/2013   HGBA1C 7.0* 07/05/2013   HGBA1C 7.3* 02/01/2013   Lab Results  Component Value Date   WBC 4.8 02/01/2013   HGB  13.3 02/01/2013   HCT 39.3 02/01/2013   PLT 196.0 02/01/2013   GLUCOSE 145* 12/06/2013   CHOL 151 07/05/2013   TRIG 158.0* 07/05/2013   HDL 38.00* 07/05/2013   LDLDIRECT 99.6 02/12/2010   LDLCALC 81 07/05/2013   ALT 16 02/01/2013   AST 16 02/01/2013   NA 137 12/06/2013   K 3.9 12/06/2013   CL 101 12/06/2013   CREATININE 0.8 12/06/2013   BUN 14 12/06/2013   CO2 30 12/06/2013   TSH 2.62 02/01/2013   HGBA1C 7.6* 12/06/2013   MICROALBUR 3.5* 12/06/2013      ASSESSMENT AND PLAN:  Discussed the following assessment and plan:  DIABETES MELLITUS, TYPE II - worsening monitor and consider in metformin   HYPERTENSION - WC effect  very high in office and low most other times redocument readings at home and constant monitoring needed, concern aobut low readings ininc meds  Need for vaccination with 13-polyvalent pneumococcal conjugate vaccine - Plan: Pneumococcal conjugate vaccine 13-valent  HTN,  white coat  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  Patient Instructions  Continue monitoring  Your BP readings on a regular basis .   And  if elevated at home contact us . Send in readings after a month via my chart.  Intensify lifestyle interventions. consider increasing metformin to 3 per day ( total 1500 mg per day) . prevnar 13 today    Total visit 74mins > 50% spent counseling and coordinating care     Wanda K. Panosh M.D.  Pre visit review using our clinic review tool, if applicable. No additional management support is needed unless otherwise documented below in the visit note.

## 2013-12-16 ENCOUNTER — Telehealth: Payer: Self-pay | Admitting: Internal Medicine

## 2013-12-16 NOTE — Telephone Encounter (Signed)
Relevant patient education assigned to patient using Emmi. ° °

## 2013-12-18 ENCOUNTER — Telehealth: Payer: Self-pay

## 2013-12-18 NOTE — Telephone Encounter (Signed)
Relevant patient education assigned to patient using Emmi. ° °

## 2014-02-12 ENCOUNTER — Other Ambulatory Visit: Payer: Self-pay | Admitting: Internal Medicine

## 2014-03-13 ENCOUNTER — Other Ambulatory Visit: Payer: Self-pay | Admitting: *Deleted

## 2014-03-13 DIAGNOSIS — I10 Essential (primary) hypertension: Secondary | ICD-10-CM

## 2014-03-13 DIAGNOSIS — E1110 Type 2 diabetes mellitus with ketoacidosis without coma: Secondary | ICD-10-CM

## 2014-03-14 ENCOUNTER — Other Ambulatory Visit (INDEPENDENT_AMBULATORY_CARE_PROVIDER_SITE_OTHER): Payer: Medicare Other

## 2014-03-14 DIAGNOSIS — I10 Essential (primary) hypertension: Secondary | ICD-10-CM

## 2014-03-14 DIAGNOSIS — E1110 Type 2 diabetes mellitus with ketoacidosis without coma: Secondary | ICD-10-CM

## 2014-03-14 DIAGNOSIS — E1310 Other specified diabetes mellitus with ketoacidosis without coma: Secondary | ICD-10-CM

## 2014-03-14 LAB — CBC WITH DIFFERENTIAL/PLATELET
BASOS ABS: 0 10*3/uL (ref 0.0–0.1)
Basophils Relative: 0 % (ref 0.0–3.0)
Eosinophils Absolute: 0.1 10*3/uL (ref 0.0–0.7)
Eosinophils Relative: 1.3 % (ref 0.0–5.0)
HCT: 41 % (ref 36.0–46.0)
HEMOGLOBIN: 13.9 g/dL (ref 12.0–15.0)
LYMPHS ABS: 0.7 10*3/uL (ref 0.7–4.0)
Lymphocytes Relative: 7.4 % — ABNORMAL LOW (ref 12.0–46.0)
MCHC: 34 g/dL (ref 30.0–36.0)
MCV: 89.3 fl (ref 78.0–100.0)
Monocytes Absolute: 0.9 10*3/uL (ref 0.1–1.0)
Monocytes Relative: 9 % (ref 3.0–12.0)
NEUTROS ABS: 8.2 10*3/uL — AB (ref 1.4–7.7)
Neutrophils Relative %: 82.3 % — ABNORMAL HIGH (ref 43.0–77.0)
Platelets: 249 10*3/uL (ref 150.0–400.0)
RBC: 4.59 Mil/uL (ref 3.87–5.11)
RDW: 12.9 % (ref 11.5–15.5)
WBC: 9.9 10*3/uL (ref 4.0–10.5)

## 2014-03-14 LAB — SEDIMENTATION RATE: Sed Rate: 38 mm/hr — ABNORMAL HIGH (ref 0–22)

## 2014-03-14 LAB — HEPATIC FUNCTION PANEL
ALT: 18 U/L (ref 0–35)
AST: 58 U/L — AB (ref 0–37)
Albumin: 4.3 g/dL (ref 3.5–5.2)
Alkaline Phosphatase: 61 U/L (ref 39–117)
BILIRUBIN TOTAL: 1.4 mg/dL — AB (ref 0.2–1.2)
Bilirubin, Direct: 0.1 mg/dL (ref 0.0–0.3)
Total Protein: 7.3 g/dL (ref 6.0–8.3)

## 2014-03-14 LAB — HEMOGLOBIN A1C: Hgb A1c MFr Bld: 7.9 % — ABNORMAL HIGH (ref 4.6–6.5)

## 2014-03-14 LAB — TSH: TSH: 2.1 u[IU]/mL (ref 0.35–4.50)

## 2014-03-17 ENCOUNTER — Encounter: Payer: Self-pay | Admitting: Internal Medicine

## 2014-03-17 ENCOUNTER — Ambulatory Visit (INDEPENDENT_AMBULATORY_CARE_PROVIDER_SITE_OTHER): Payer: Medicare Other | Admitting: Internal Medicine

## 2014-03-17 VITALS — BP 180/80 | Temp 98.3°F | Ht 62.0 in | Wt 128.0 lb

## 2014-03-17 DIAGNOSIS — R5383 Other fatigue: Secondary | ICD-10-CM

## 2014-03-17 DIAGNOSIS — R0789 Other chest pain: Secondary | ICD-10-CM

## 2014-03-17 DIAGNOSIS — E1165 Type 2 diabetes mellitus with hyperglycemia: Secondary | ICD-10-CM

## 2014-03-17 DIAGNOSIS — R5381 Other malaise: Secondary | ICD-10-CM

## 2014-03-17 DIAGNOSIS — IMO0001 Reserved for inherently not codable concepts without codable children: Secondary | ICD-10-CM

## 2014-03-17 DIAGNOSIS — I10 Essential (primary) hypertension: Secondary | ICD-10-CM

## 2014-03-17 DIAGNOSIS — R52 Pain, unspecified: Secondary | ICD-10-CM

## 2014-03-17 DIAGNOSIS — R740 Nonspecific elevation of levels of transaminase and lactic acid dehydrogenase [LDH]: Secondary | ICD-10-CM

## 2014-03-17 DIAGNOSIS — R748 Abnormal levels of other serum enzymes: Secondary | ICD-10-CM

## 2014-03-17 DIAGNOSIS — E785 Hyperlipidemia, unspecified: Secondary | ICD-10-CM

## 2014-03-17 DIAGNOSIS — R7401 Elevation of levels of liver transaminase levels: Secondary | ICD-10-CM

## 2014-03-17 LAB — GLUCOSE, POCT (MANUAL RESULT ENTRY): POC Glucose: 137 mg/dl — AB (ref 70–99)

## 2014-03-17 NOTE — Progress Notes (Signed)
Chief Complaint  Patient presents with  . Fatigue    Sx started 5 days ago.  . Generalized Body Aches    HPI: Patient comes in today for SDA for  new problem evaluation.  Insidious but definite onset of body aches mostly upper body x 5 days   Hard to take a deep breath ? No cough but ? If chest discomfort for a second  Ache all over .  Onset 5 days getting worse over time  tiring to raise arms  To dry self. Towel felt heavy.  Getting worse.  Canceled a lunch appt cause of how she felt   No fever but ./ if got cold recently  No rashes   No tick bites but husband thought could be related after looking on line  Some nausea.    No new medicine.   119/70 and 81  At home. ( has [facility])  DM  Up last month bg .     ROS: See pertinent positives and negatives per HPI.no syncope travel tick bites numbness   No gu gi sx otherwise syncope cough   Past Medical History  Diagnosis Date  . Diabetes mellitus   . White coat hypertension   . OA (osteoarthritis)   . Diverticulosis   . Hyperlipidemia   . Anemia     hysterectomy    Family History  Problem Relation Age of Onset  . Cancer Mother     breast    History   Social History  . Marital Status: Married    Spouse Name: N/A    Number of Children: N/A  . Years of Education: N/A   Social History Main Topics  . Smoking status: Never Smoker   . Smokeless tobacco: None  . Alcohol Use:   . Drug Use:   . Sexual Activity:    Other Topics Concern  . None   Social History Narrative   Married husband no retired   hh of 2   No pets    Outpatient Encounter Prescriptions as of 03/17/2014  Medication Sig  . aspirin 81 MG tablet Take 81 mg by mouth daily.  . Calcium Carbonate-Vitamin D (OSCAL 500/200 D-3 PO) Take by mouth.  . carvedilol (COREG) 6.25 MG tablet TAKE ONE TABLET BY MOUTH TWICE DAILY WITH A MEAL.  . metFORMIN (GLUCOPHAGE) 500 MG tablet TAKE ONE TABLET BY MOUTH TWICE DAILY WITH FOOD  . pravastatin (PRAVACHOL) 40 MG  tablet Take 2 tablets (80 mg total) by mouth daily.    EXAM:  BP 180/80  Temp(Src) 98.3 F (36.8 C) (Oral)  Ht 5\' 2"  (1.575 m)  Wt 128 lb (58.06 kg)  BMI 23.41 kg/m2  Body mass index is 23.41 kg/(m^2).  GENERAL: vitals reviewed and listed above, alert, oriented, appears well hydrated and in no acute distress HEENT: atraumatic, conjunctiva  clear, no obvious abnormalities on inspection of external nose and ears OP : no lesion edema or exudate  NECK: no obvious masses on inspection palpation  LUNGS: clear to auscultation bilaterally, no wheezes, rales or rhonchi, good air movement CV: HRRR, no clubbing cyanosis or  peripheral edema nl cap refill  Abdomen:  Sof,t normal bowel sounds without hepatosplenomegaly, no guarding rebound or masses no CVA tenderness Skin: normal capillary refill ,turgor , color: No acute rashes ,petechiae or bruising MS: moves all extremities without noticeable focal  Abnormality  exrtensor  Upper arms  / weaker than flexors  . NEURO: oriented x 3 CN 3-12 appear intact.  DTRs symmetrical. Gait WNL.  Grossly non focal. No tremor or abnormal movement. PSYCH: pleasant and cooperative, no obvious depression or anxiety Lab Results  Component Value Date   WBC 9.9 03/14/2014   HGB 13.9 03/14/2014   HCT 41.0 03/14/2014   PLT 249.0 03/14/2014   GLUCOSE 145* 12/06/2013   CHOL 151 07/05/2013   TRIG 158.0* 07/05/2013   HDL 38.00* 07/05/2013   LDLDIRECT 99.6 02/12/2010   LDLCALC 81 07/05/2013   ALT 18 03/14/2014   AST 58* 03/14/2014   NA 137 12/06/2013   K 3.9 12/06/2013   CL 101 12/06/2013   CREATININE 0.8 12/06/2013   BUN 14 12/06/2013   CO2 30 12/06/2013   TSH 2.10 03/14/2014   HGBA1C 7.9* 03/14/2014   MICROALBUR 3.5* 12/06/2013   EKG NSR no acute changes  ASSESSMENT AND PLAN:  Discussed the following assessment and plan:  Body aches - upper body waek feeling exan ? weakaer wtih extensors rest nl /stop statin at this time and follow   Fatigue - Plan: EKG 12-Lead  Chest  discomfort - very ns and vague. ekg nl doubt cv cause - Plan: EKG 12-Lead  Type II or unspecified type diabetes mellitus without mention of complication, uncontrolled - sub optimal control but not causing sx  - Plan: POC Glucose (CBG)  White coat hypertension - up today normal at home   Other and unspecified hyperlipidemia - stop prava for now  Abnormal AST  - 58 last week follow  Suppose this could be a viral syndrome reminiscent of pmr/ flu like est only 38 .  consdier repeat labs at fu  -Patient advised to return or notify health care team  if symptoms worsen ,persist or new concerns arise.  Patient Instructions  Stop the pravastatin incase it could be causing body aches . Labs not revealing as to the cause of your sx . ekg is normal. Please  Keep appt at end go week   In the meantime . Fluids and rest incase a viral illness .  Monitor temperature to check for  Fever . 100. To 100.3 and above  Contact if any worsening in the meantime  Wanda K. Panosh M.D.  Pre visit review using our clinic review tool, if applicable. No additional management support is needed unless otherwise documented below in the visit note.

## 2014-03-17 NOTE — Patient Instructions (Addendum)
Stop the pravastatin incase it could be causing body aches . Labs not revealing as to the cause of your sx . ekg is normal. Please  Keep appt at end go week   In the meantime . Fluids and rest incase a viral illness .  Monitor temperature to check for  Fever . 100. To 100.3 and above  Contact if any worsening in the meantime

## 2014-03-18 ENCOUNTER — Telehealth: Payer: Self-pay | Admitting: Internal Medicine

## 2014-03-18 NOTE — Telephone Encounter (Signed)
Relevant patient education assigned to patient using Emmi. ° °

## 2014-03-21 ENCOUNTER — Encounter: Payer: Self-pay | Admitting: Internal Medicine

## 2014-03-21 ENCOUNTER — Ambulatory Visit (INDEPENDENT_AMBULATORY_CARE_PROVIDER_SITE_OTHER): Payer: Medicare Other | Admitting: Internal Medicine

## 2014-03-21 VITALS — BP 196/84 | Temp 98.0°F | Ht 62.0 in | Wt 131.0 lb

## 2014-03-21 DIAGNOSIS — E119 Type 2 diabetes mellitus without complications: Secondary | ICD-10-CM

## 2014-03-21 DIAGNOSIS — IMO0001 Reserved for inherently not codable concepts without codable children: Secondary | ICD-10-CM

## 2014-03-21 DIAGNOSIS — R748 Abnormal levels of other serum enzymes: Secondary | ICD-10-CM

## 2014-03-21 DIAGNOSIS — I10 Essential (primary) hypertension: Secondary | ICD-10-CM

## 2014-03-21 DIAGNOSIS — R7402 Elevation of levels of lactic acid dehydrogenase (LDH): Secondary | ICD-10-CM

## 2014-03-21 DIAGNOSIS — R740 Nonspecific elevation of levels of transaminase and lactic acid dehydrogenase [LDH]: Secondary | ICD-10-CM

## 2014-03-21 DIAGNOSIS — R52 Pain, unspecified: Secondary | ICD-10-CM

## 2014-03-21 MED ORDER — METFORMIN HCL 500 MG PO TABS: ORAL_TABLET | ORAL | Status: DC

## 2014-03-21 NOTE — Patient Instructions (Addendum)
Increase metformin  To 1500 mg per day or 3 per day.  Stay off the pravachol for now  And we will probably restart later.  Plan recheck labs liver and other test in about a month . Plan for wellness visit in about 3 months and we will do needed labs  aortic stenosis approparite .

## 2014-03-21 NOTE — Progress Notes (Signed)
Chief Complaint  Patient presents with  . Follow-up    myalgias  . Diabetes    HPI: Sharon Wilson  comes in today for follow up of  multiple medical problems.   Myalgias and feeling badly see prev appt   Much improved but almose back to normal . Took temp and no fever in 97 No new sx feels much better over 50 %  Also disease management  DM could do better   Knee  Not as much exercise no numbness vision change  HTWCreadings were normal in the 120 range /80 or less ( machine alsoe checked on husband ) LFT minor elevation last week  ROS: See pertinent positives and negatives per HPI.no cough cp sob better   Past Medical History  Diagnosis Date  . Diabetes mellitus   . White coat hypertension   . OA (osteoarthritis)   . Diverticulosis   . Hyperlipidemia   . Anemia     hysterectomy    Family History  Problem Relation Age of Onset  . Cancer Mother     breast    History   Social History  . Marital Status: Married    Spouse Name: N/A    Number of Children: N/A  . Years of Education: N/A   Social History Main Topics  . Smoking status: Never Smoker   . Smokeless tobacco: None  . Alcohol Use:   . Drug Use:   . Sexual Activity:    Other Topics Concern  . None   Social History Narrative   Married husband no retired   hh of 2   No pets    Outpatient Encounter Prescriptions as of 03/21/2014  Medication Sig  . aspirin 81 MG tablet Take 81 mg by mouth daily.  . Calcium Carbonate-Vitamin D (OSCAL 500/200 D-3 PO) Take by mouth.  . carvedilol (COREG) 6.25 MG tablet TAKE ONE TABLET BY MOUTH TWICE DAILY WITH A MEAL.  . metFORMIN (GLUCOPHAGE) 500 MG tablet Take 3 per day  . [DISCONTINUED] metFORMIN (GLUCOPHAGE) 500 MG tablet TAKE ONE TABLET BY MOUTH TWICE DAILY WITH FOOD    EXAM:  BP 196/84  Temp(Src) 98 F (36.7 C) (Oral)  Ht 5\' 2"  (1.575 m)  Wt 131 lb (59.421 kg)  BMI 23.95 kg/m2  Body mass index is 23.95 kg/(m^2). Much more comfortable today    GENERAL: vitals reviewed and listed above, alert, oriented, appears well hydrated and in no acute distress HEENT: atraumatic, conjunctiva  clear, no obvious abnormalities on inspection of external nose and ears OP : no lesion edema or exudate  NECK: no obvious masses on inspection palpation  LUNGS: clear to auscultation bilaterally, no wheezes, rales or rhonchi, good air movement CV: HRRR, no clubbing cyanosis or  peripheral edema nl cap refill  MS: moves all extremities without noticeable focal  abnormality PSYCH: pleasant and cooperative, no obvious depression or anxiety Lab Results  Component Value Date   WBC 9.9 03/14/2014   HGB 13.9 03/14/2014   HCT 41.0 03/14/2014   PLT 249.0 03/14/2014   GLUCOSE 145* 12/06/2013   CHOL 151 07/05/2013   TRIG 158.0* 07/05/2013   HDL 38.00* 07/05/2013   LDLDIRECT 99.6 02/12/2010   LDLCALC 81 07/05/2013   ALT 18 03/14/2014   AST 58* 03/14/2014   NA 137 12/06/2013   K 3.9 12/06/2013   CL 101 12/06/2013   CREATININE 0.8 12/06/2013   BUN 14 12/06/2013   CO2 30 12/06/2013   TSH 2.10 03/14/2014  HGBA1C 7.9* 03/14/2014   MICROALBUR 3.5* 12/06/2013    ASSESSMENT AND PLAN:  Discussed the following assessment and plan:  Body aches - much improved no fever suspeck statin but could be inetervening issue. - Plan: CK, Sedimentation rate, Hepatic function panel, Hepatitis C antibody, Hepatitis B surface antigen, Hepatitis B surface antibody, Hepatitis B Core AB, Total  White coat hypertension - nl bp at home.  - Plan: CK, Sedimentation rate, Hepatic function panel, Hepatitis C antibody, Hepatitis B surface antigen, Hepatitis B surface antibody, Hepatitis B Core AB, Total  Abnormal AST and ALT - poss med  fu in [place] with lab andas appropriate  - Plan: CK, Sedimentation rate, Hepatic function panel, Hepatitis C antibody, Hepatitis B surface antigen, Hepatitis B surface antibody, Hepatitis B Core AB, Total  DIABETES MELLITUS, TYPE II - incr metformin to 1500 mg per day   .  -Patient advised to return or notify health care team  if symptoms worsen ,persist or new concerns arise.  Patient Instructions  Increase metformin  To 1500 mg per day or 3 per day.  Stay off the pravachol for now  And we will probably restart later.  Plan recheck labs liver and other test in about a month . Plan for wellness visit in about 3 months and we will do needed labs  aortic stenosis approparite .   Standley Brooking. Dustin Burrill M.D.  Pre visit review using our clinic review tool, if applicable. No additional management support is needed unless otherwise documented below in the visit note.

## 2014-04-16 ENCOUNTER — Encounter: Payer: Self-pay | Admitting: Ophthalmology

## 2014-04-16 ENCOUNTER — Other Ambulatory Visit: Payer: Self-pay | Admitting: Internal Medicine

## 2014-04-18 ENCOUNTER — Telehealth: Payer: Self-pay

## 2014-04-18 NOTE — Telephone Encounter (Signed)
Relevant patient education assigned to patient using Emmi. ° °

## 2014-05-07 ENCOUNTER — Other Ambulatory Visit (INDEPENDENT_AMBULATORY_CARE_PROVIDER_SITE_OTHER): Payer: Medicare Other

## 2014-05-07 DIAGNOSIS — R748 Abnormal levels of other serum enzymes: Secondary | ICD-10-CM

## 2014-05-07 DIAGNOSIS — R52 Pain, unspecified: Secondary | ICD-10-CM

## 2014-05-07 DIAGNOSIS — R030 Elevated blood-pressure reading, without diagnosis of hypertension: Secondary | ICD-10-CM

## 2014-05-07 DIAGNOSIS — IMO0001 Reserved for inherently not codable concepts without codable children: Secondary | ICD-10-CM

## 2014-05-07 DIAGNOSIS — R740 Nonspecific elevation of levels of transaminase and lactic acid dehydrogenase [LDH]: Secondary | ICD-10-CM

## 2014-05-07 DIAGNOSIS — R7401 Elevation of levels of liver transaminase levels: Secondary | ICD-10-CM

## 2014-05-07 LAB — HEPATIC FUNCTION PANEL
ALBUMIN: 4 g/dL (ref 3.5–5.2)
ALK PHOS: 54 U/L (ref 39–117)
ALT: 13 U/L (ref 0–35)
AST: 17 U/L (ref 0–37)
Bilirubin, Direct: 0.1 mg/dL (ref 0.0–0.3)
Total Bilirubin: 0.4 mg/dL (ref 0.2–1.2)
Total Protein: 6.9 g/dL (ref 6.0–8.3)

## 2014-05-07 LAB — SEDIMENTATION RATE: SED RATE: 19 mm/h (ref 0–22)

## 2014-05-07 LAB — CK: CK TOTAL: 66 U/L (ref 7–177)

## 2014-05-08 LAB — HEPATITIS B SURFACE ANTIGEN: Hepatitis B Surface Ag: NEGATIVE

## 2014-05-08 LAB — HEPATITIS B CORE ANTIBODY, TOTAL: HEP B C TOTAL AB: NONREACTIVE

## 2014-05-08 LAB — HEPATITIS B SURFACE ANTIBODY,QUALITATIVE: HEP B S AB: NEGATIVE

## 2014-05-08 LAB — HEPATITIS C ANTIBODY: HCV Ab: NEGATIVE

## 2014-05-14 ENCOUNTER — Encounter: Payer: Self-pay | Admitting: Family Medicine

## 2014-06-27 ENCOUNTER — Encounter: Payer: Self-pay | Admitting: Internal Medicine

## 2014-06-30 ENCOUNTER — Other Ambulatory Visit: Payer: Self-pay | Admitting: Family Medicine

## 2014-06-30 MED ORDER — METFORMIN HCL 500 MG PO TABS: ORAL_TABLET | ORAL | Status: DC

## 2014-08-01 ENCOUNTER — Other Ambulatory Visit: Payer: Self-pay | Admitting: Internal Medicine

## 2014-08-05 ENCOUNTER — Ambulatory Visit (INDEPENDENT_AMBULATORY_CARE_PROVIDER_SITE_OTHER): Payer: Medicare Other | Admitting: Internal Medicine

## 2014-08-05 ENCOUNTER — Encounter: Payer: Self-pay | Admitting: Internal Medicine

## 2014-08-05 VITALS — BP 130/70 | Temp 98.2°F | Ht 61.5 in | Wt 131.9 lb

## 2014-08-05 DIAGNOSIS — E785 Hyperlipidemia, unspecified: Secondary | ICD-10-CM

## 2014-08-05 DIAGNOSIS — E1165 Type 2 diabetes mellitus with hyperglycemia: Secondary | ICD-10-CM

## 2014-08-05 DIAGNOSIS — IMO0001 Reserved for inherently not codable concepts without codable children: Secondary | ICD-10-CM

## 2014-08-05 DIAGNOSIS — E2839 Other primary ovarian failure: Secondary | ICD-10-CM

## 2014-08-05 DIAGNOSIS — R030 Elevated blood-pressure reading, without diagnosis of hypertension: Secondary | ICD-10-CM

## 2014-08-05 DIAGNOSIS — Z0000 Encounter for general adult medical examination without abnormal findings: Secondary | ICD-10-CM

## 2014-08-05 DIAGNOSIS — Z789 Other specified health status: Secondary | ICD-10-CM

## 2014-08-05 NOTE — Progress Notes (Signed)
Pre visit review using our clinic review tool, if applicable. No additional management support is needed unless otherwise documented below in the visit note.  Chief Complaint  Patient presents with  . Medicare Wellness    wcht  . Diabetes    HPI: Patient comes in today for Preventive Medicare wellness visit . And disease management  No major injuries, ed visits ,hospitalizations , new medications since last visit.  Bg up and down up today  Am readings  Bounces  Up and down none over 200  No neuro or eye sx   bp about 110 and 120 range at home  And up today thinking  130 and 140   No cp sob bleeding numbness falling no se of medication reported   Health Maintenance  Topic Date Due  . Foot Exam  07/24/2011  . Influenza Vaccine  06/07/2014  . Hemoglobin A1c  09/14/2014  . Mammogram  10/02/2014  . Urine Microalbumin  12/06/2014  . Ophthalmology Exam  04/17/2015  . Colonoscopy  10/07/2017  . Tetanus/tdap  08/22/2019  . Pneumococcal Polysaccharide Vaccine Age 70 And Over  Completed  . Zostavax  Completed   Health Maintenance Review  LIFESTYLE:  Exercise:   Yes  No limitation Tobacco/ETS: no Alcohol: no rare  Sugar beverages: no Sleep:  About 6-7  Some wakening   Drug use: no Bone density:    Colonoscopy:  12 08 mammo next month   Hearing: ok   Vision:  No limitations at present . Last eye check UTD  Safety:  Has smoke detector and wears seat belts.  No firearms. No excess sun exposure. Sees dentist regularly.  Falls: no  Advance directive :  Reviewed   Given HO  Memory: Felt to be good  , no concern from her or her family.  Depression: No anhedonia unusual crying or depressive symptoms  Nutrition: Eats well balanced diet; adequate calcium and vitamin D. No swallowing chewing problems.  Injury: no major injuries in the last six months.  Other healthcare providers:  Reviewed today .  Social:  Lives with spouse married. No pets.   Preventive parameters:  up-to-date  Reviewed   ADLS:   There are no problems or need for assistance  driving, feeding, obtaining food, dressing, toileting and bathing, managing money using phone. She is independent.  ROS:  GEN/ HEENT: No fever, significant weight changes sweats headaches vision problems hearing changes, CV/ PULM; No chest pain shortness of breath cough, syncope,edema  change in exercise tolerance. GI /GU: No adominal pain, vomiting, change in bowel habits. No blood in the stool. No significant GU symptoms. SKIN/HEME: ,no acute skin rashes suspicious lesions or bleeding. No lymphadenopathy, nodules, masses.  NEURO/ PSYCH:  No neurologic signs such as weakness numbness. No depression anxiety. IMM/ Allergy: No unusual infections.  Allergy .   REST of 12 system review negative except as per HPI   Past Medical History  Diagnosis Date  . Diabetes mellitus   . White coat hypertension   . OA (osteoarthritis)   . Diverticulosis   . Hyperlipidemia   . Anemia     hysterectomy    Family History  Problem Relation Age of Onset  . Cancer Mother     breast    History   Social History  . Marital Status: Married    Spouse Name: N/A    Number of Children: N/A  . Years of Education: N/A   Social History Main Topics  . Smoking status: Never Smoker   .  Smokeless tobacco: None  . Alcohol Use:   . Drug Use:   . Sexual Activity:    Other Topics Concern  . None   Social History Narrative   Married husband no retired   hh of 2   No pets    Outpatient Encounter Prescriptions as of 08/05/2014  Medication Sig  . aspirin 81 MG tablet Take 81 mg by mouth daily.  . Calcium Carbonate-Vitamin D (OSCAL 500/200 D-3 PO) Take by mouth.  . carvedilol (COREG) 6.25 MG tablet TAKE ONE TABLET BY MOUTH TWICE DAILY. TAKE WITH A MEAL.  . metFORMIN (GLUCOPHAGE) 500 MG tablet Take 1 tablet in the morning and 2 in the evening    EXAM:  BP 130/70  Temp(Src) 98.2 F (36.8 C) (Oral)  Ht 5' 1.5" (1.562 m)  Wt  131 lb 14.4 oz (59.829 kg)  BMI 24.52 kg/m2  Body mass index is 24.52 kg/(m^2).  Physical Exam: Vital signs reviewed OHY:WVPX is a well-developed well-nourished alert cooperative   who appears stated age in no acute distress.  HEENT: normocephalic atraumatic , Eyes: PERRL EOM's full, conjunctiva clear, Nares: paten,t no deformity discharge or tenderness., Ears: no deformity EAC's clear TMs with normal landmarks. Mouth: clear OP, no lesions, edema.  Moist mucous membranes. Dentition in adequate repair. NECK: supple without masses, thyromegaly or bruits. CHEST/PULM:  Clear to auscultation and percussion breath sounds equal no wheeze , rales or rhonchi. No chest wall deformities or tenderness. CV: PMI is nondisplaced, S1 S2 no gallops, murmurs, rubs. Peripheral pulses are full without delay.No JVD .  Breast: normal by inspection . No dimpling, discharge, masses, tenderness or discharge . ABDOMEN: Bowel sounds normal nontender  No guard or rebound, no hepato splenomegal no CVA tenderness.  No hernia. Extremtities:  No clubbing cyanosis or edema, no acute joint swelling or redness no focal atrophy NEURO:  Oriented x3, cranial nerves 3-12 appear to be intact, no obvious focal weakness,gait within normal limits no abnormal reflexes or asymmetrical SKIN: No acute rashes normal turgor, color, no bruising or petechiae. PSYCH: Oriented, good eye contact, no obvious depression anxiety, cognition and judgment appear normal. LN: no cervical axillary inguinal adenopathy No noted deficits in memory, attention, and speech.  Diabetic  foot exam done Lab Results  Component Value Date   WBC 9.9 03/14/2014   HGB 13.9 03/14/2014   HCT 41.0 03/14/2014   PLT 249.0 03/14/2014   GLUCOSE 145* 12/06/2013   CHOL 151 07/05/2013   TRIG 158.0* 07/05/2013   HDL 38.00* 07/05/2013   LDLDIRECT 99.6 02/12/2010   LDLCALC 81 07/05/2013   ALT 13 05/07/2014   AST 17 05/07/2014   NA 137 12/06/2013   K 3.9 12/06/2013   CL 101 12/06/2013    CREATININE 0.8 12/06/2013   BUN 14 12/06/2013   CO2 30 12/06/2013   TSH 2.10 03/14/2014   HGBA1C 7.9* 03/14/2014   MICROALBUR 3.5* 12/06/2013    ASSESSMENT AND PLAN:  Discussed the following assessment and plan:  Medicare annual wellness visit, initial  Visit for preventive health examination  Type II or unspecified type diabetes mellitus without mention of complication, uncontrolled - check a1c today  - Plan: Basic metabolic panel, Hemoglobin A1c, Lipid panel, Hepatic function panel, Microalbumin / creatinine urine ratio  HTN, white coat - monotor checked in past reports normal at home  - Plan: Basic metabolic panel, Hemoglobin A1c, Lipid panel, Hepatic function panel, Microalbumin / creatinine urine ratio  HYPERLIPIDEMIA - Plan: Basic metabolic panel, Hemoglobin A1c, Lipid panel,  Hepatic function panel, Microalbumin / creatinine urine ratio  Estrogen deficiency - dexa - Plan: DG Bone Density To get flu vaccine at target .  Will be utd get dexa . Fu  4 months  counsled continued lsi  Copy oaf advance directives form given Patient Care Team: [name], MD as PCP - General Sable Feil, MD as Attending Physician (Gastroenterology) Griffith Citron, MD as Attending Physician (Ophthalmology)  Patient Instructions  Healthy lifestyle includes : At least 150 minutes of exercise weeks  , weight at healthy levels, which is usually   BMI 19-25. Avoid trans fats and processed foods;  Increase fresh fruits and veges to 5 servings per day. And avoid sweet beverages including tea and juice. Mediterranean diet with olive oil and nuts have been noted to be heart and brain healthy . Avoid tobacco products . Limit  alcohol to  7 per week for women and 14 servings for men.  Get adequate sleep . Wear seat belts . Don't text and drive .   Will notify you  of labs when available. Get dexa scan appt  Continue to monitor BP readings to make sure in range .  ROV in 4 months hg a1c pre visit  Or  asneeded     Standley Brooking. Zakyah Yanes M.D.  Electronic health record  Internet system was down was not working properly at bedtime and the exam reconstructed note and orders.  Uncertain if patient got her blood tests as ordered  .

## 2014-08-05 NOTE — Patient Instructions (Addendum)
Healthy lifestyle includes : At least 150 minutes of exercise weeks  , weight at healthy levels, which is usually   BMI 19-25. Avoid trans fats and processed foods;  Increase fresh fruits and veges to 5 servings per day. And avoid sweet beverages including tea and juice. Mediterranean diet with olive oil and nuts have been noted to be heart and brain healthy . Avoid tobacco products . Limit  alcohol to  7 per week for women and 14 servings for men.  Get adequate sleep . Wear seat belts . Don't text and drive .   Will notify you  of labs when available. Get dexa scan appt  Continue to monitor BP readings to make sure in range .  ROV in 4 months hg a1c pre visit  Or asneeded   Advanced directives to review fluy vaccine yearly

## 2014-08-06 ENCOUNTER — Encounter: Payer: Self-pay | Admitting: Internal Medicine

## 2014-08-06 DIAGNOSIS — E2839 Other primary ovarian failure: Secondary | ICD-10-CM | POA: Insufficient documentation

## 2014-08-06 DIAGNOSIS — Z0000 Encounter for general adult medical examination without abnormal findings: Secondary | ICD-10-CM | POA: Insufficient documentation

## 2014-08-06 DIAGNOSIS — Z789 Other specified health status: Secondary | ICD-10-CM | POA: Insufficient documentation

## 2014-08-08 ENCOUNTER — Telehealth: Payer: Self-pay | Admitting: Family Medicine

## 2014-08-08 NOTE — Telephone Encounter (Signed)
Message copied by Hulda Humphrey on Fri Aug 08, 2014  4:37 PM ------      Message from: [facility], [place] K      Created: Fri Aug 08, 2014  4:30 PM      Regarding: RE: no lab blood  results found       Thanks       Ehren Berisha can you contact patinet about  Getting blood tests done?      Thanks       WP      ----- Message -----         From: Elmer Picker         Sent: 08/07/2014  11:59 AM           To: [name], MD      Subject: RE: no lab blood  results found                          It seems like patient did not come to the lab for blood work on Tuesday.            ----- Message -----         From: [name], MD         Sent: 08/06/2014  10:06 PM           To: Elmer Picker      Subject: no lab blood  results found                              Pt should have had  Blood test done at her visit on Tuesday and none in results             Please advise and report results       Thanks      WP             ------

## 2014-08-11 NOTE — Telephone Encounter (Signed)
Pt states it was her understanding no labs needed to be done until Jan. Pt refused to make appt until I clairify that dr Regis Bill wants these labs done now, and then more in Jan.

## 2014-08-11 NOTE — Telephone Encounter (Signed)
Please review WP's statement below.  Labs are needed.  Please help the pt to make this appointment.  Thanks!  Labs needed now are for her yearly wellness exam.  Lab needed in Jan is to check her sugar level.

## 2014-08-11 NOTE — Telephone Encounter (Signed)
LM with the patient's husband for her to return my call. 

## 2014-08-12 NOTE — Telephone Encounter (Signed)
Pt has been scheudled for labs 10/15.  Can you put the order in for the Jan lab appt.?

## 2014-08-13 ENCOUNTER — Ambulatory Visit (INDEPENDENT_AMBULATORY_CARE_PROVIDER_SITE_OTHER)
Admission: RE | Admit: 2014-08-13 | Discharge: 2014-08-13 | Disposition: A | Discharge status: Home / Self Care | Payer: Medicare Other | Source: Ambulatory Visit | Attending: Internal Medicine | Admitting: Internal Medicine

## 2014-08-13 DIAGNOSIS — E2839 Other primary ovarian failure: Secondary | ICD-10-CM

## 2014-08-18 NOTE — Telephone Encounter (Signed)
Order placed

## 2014-08-21 ENCOUNTER — Other Ambulatory Visit: Payer: Medicare Other

## 2014-08-21 LAB — HEPATIC FUNCTION PANEL
ALT: 13 U/L (ref 0–35)
AST: 18 U/L (ref 0–37)
Albumin: 3.5 g/dL (ref 3.5–5.2)
Alkaline Phosphatase: 57 U/L (ref 39–117)
BILIRUBIN TOTAL: 0.7 mg/dL (ref 0.2–1.2)
Bilirubin, Direct: 0.1 mg/dL (ref 0.0–0.3)
Total Protein: 7 g/dL (ref 6.0–8.3)

## 2014-08-21 LAB — BASIC METABOLIC PANEL
BUN: 14 mg/dL (ref 6–23)
CO2: 27 mEq/L (ref 19–32)
Calcium: 9.1 mg/dL (ref 8.4–10.5)
Chloride: 100 mEq/L (ref 96–112)
Creatinine, Ser: 0.9 mg/dL (ref 0.4–1.2)
GFR: 66.7 mL/min (ref >60.00)
Glucose, Bld: 133 mg/dL — ABNORMAL HIGH (ref 70–99)
Potassium: 3.8 mEq/L (ref 3.5–5.1)
Sodium: 138 mEq/L (ref 135–145)

## 2014-08-21 LAB — LIPID PANEL
CHOLESTEROL: 248 mg/dL — AB (ref 0–200)
HDL: 30.8 mg/dL — AB (ref >39.00)
NonHDL: 217.2
Total CHOL/HDL Ratio: 8
Triglycerides: 260 mg/dL — ABNORMAL HIGH (ref 0.0–149.0)
VLDL: 52 mg/dL — ABNORMAL HIGH (ref 0.0–40.0)

## 2014-08-21 LAB — HEMOGLOBIN A1C: HEMOGLOBIN A1C: 7.3 % — AB (ref 4.6–6.5)

## 2014-08-21 LAB — MICROALBUMIN / CREATININE URINE RATIO
Creatinine,U: 112 mg/dL
MICROALB UR: 1.5 mg/dL (ref 0.0–1.9)
Microalb Creat Ratio: 1.3 mg/g (ref 0.0–30.0)

## 2014-08-21 LAB — LDL CHOLESTEROL, DIRECT: Direct LDL: 147.7 mg/dL

## 2014-08-22 ENCOUNTER — Other Ambulatory Visit: Payer: Self-pay

## 2014-08-22 DIAGNOSIS — Z1231 Encounter for screening mammogram for malignant neoplasm of breast: Secondary | ICD-10-CM

## 2014-08-26 ENCOUNTER — Ambulatory Visit
Admission: RE | Admit: 2014-08-26 | Discharge: 2014-08-26 | Disposition: A | Discharge status: Home / Self Care | Payer: Medicare Other | Source: Ambulatory Visit

## 2014-08-26 DIAGNOSIS — Z1231 Encounter for screening mammogram for malignant neoplasm of breast: Secondary | ICD-10-CM

## 2014-09-08 ENCOUNTER — Encounter: Payer: Self-pay | Admitting: Internal Medicine

## 2014-09-29 ENCOUNTER — Other Ambulatory Visit: Payer: Self-pay | Admitting: Internal Medicine

## 2014-09-29 MED ORDER — METFORMIN HCL 500 MG PO TABS: ORAL_TABLET | ORAL | Status: DC

## 2014-09-29 NOTE — Telephone Encounter (Signed)
Sent to the pharmacy by e-scribe. 

## 2014-10-26 ENCOUNTER — Other Ambulatory Visit: Payer: Self-pay | Admitting: Internal Medicine

## 2014-10-27 MED ORDER — CARVEDILOL 6.25 MG PO TABS: 6.2500 mg | ORAL_TABLET | Freq: Two times a day (BID) | ORAL | Status: DC

## 2014-11-28 ENCOUNTER — Other Ambulatory Visit: Payer: Medicare Other

## 2014-12-01 ENCOUNTER — Other Ambulatory Visit: Payer: Self-pay | Admitting: *Deleted

## 2014-12-01 ENCOUNTER — Other Ambulatory Visit (INDEPENDENT_AMBULATORY_CARE_PROVIDER_SITE_OTHER): Payer: Medicare Other

## 2014-12-01 DIAGNOSIS — E119 Type 2 diabetes mellitus without complications: Secondary | ICD-10-CM

## 2014-12-01 LAB — HEMOGLOBIN A1C: Hgb A1c MFr Bld: 7.7 % — ABNORMAL HIGH (ref 4.6–6.5)

## 2014-12-05 ENCOUNTER — Encounter: Payer: Self-pay | Admitting: Internal Medicine

## 2014-12-05 ENCOUNTER — Ambulatory Visit (INDEPENDENT_AMBULATORY_CARE_PROVIDER_SITE_OTHER): Payer: Medicare Other | Admitting: Internal Medicine

## 2014-12-05 VITALS — BP 135/70 | Temp 97.9°F | Ht 61.0 in | Wt 132.4 lb

## 2014-12-05 DIAGNOSIS — IMO0001 Reserved for inherently not codable concepts without codable children: Secondary | ICD-10-CM

## 2014-12-05 DIAGNOSIS — R030 Elevated blood-pressure reading, without diagnosis of hypertension: Secondary | ICD-10-CM

## 2014-12-05 DIAGNOSIS — E119 Type 2 diabetes mellitus without complications: Secondary | ICD-10-CM

## 2014-12-05 MED ORDER — METFORMIN HCL 500 MG PO TABS: ORAL_TABLET | ORAL | Status: DC

## 2014-12-05 MED ORDER — CARVEDILOL 12.5 MG PO TABS: 12.5000 mg | ORAL_TABLET | Freq: Two times a day (BID) | ORAL | Status: DC

## 2014-12-05 NOTE — Patient Instructions (Addendum)
Increase the metformin to 4 500 mg per day   2 twice a day.   ( maximum dose )  continue to monitor Blood sugars.  Goal fasting below 120 Goal 2 hours after eating  Below 180  Acceptable but best 140- 150 and below   Increase the coreg  12.5 po twice a day.  For baseline BP .  ROV 4 months  Bmp and a1c pre visit

## 2014-12-05 NOTE — Progress Notes (Signed)
Pre visit review using our clinic review tool, if applicable. No additional management support is needed unless otherwise documented below in the visit note.  Chief Complaint  Patient presents with  . Follow-up    HPI: YESENNIA HIROTA 74 y.o.  F/u chronic disease management . Bp:   130s this am .  Usually 118 range  Had flu sx last weeks and sinus and laryngitis .    Sleep  Wake up once .  No osa ha reg  No sig exercise    Holidays  DM: Metformin  1500 mg.   Hard to swallow .  pilss recently  No se   Check  bgs around 120 in am  130.   Rare.post prandial  ROS: See pertinent positives and negatives per HPI. No cp sob  Some ha no fever now better uri.   Past Medical History  Diagnosis Date  . Diabetes mellitus   . White coat hypertension   . OA (osteoarthritis)   . Diverticulosis   . Hyperlipidemia   . Anemia     hysterectomy    Family History  Problem Relation Age of Onset  . Cancer Mother     breast    History   Social History  . Marital Status: Married    Spouse Name: N/A    Number of Children: N/A  . Years of Education: N/A   Social History Main Topics  . Smoking status: Never Smoker   . Smokeless tobacco: None  . Alcohol Use: None  . Drug Use: None  . Sexual Activity: None   Other Topics Concern  . None   Social History Narrative   Married husband no retired   hh of 2   No pets    Outpatient Encounter Prescriptions as of 12/05/2014  Medication Sig  . aspirin 81 MG tablet Take 81 mg by mouth daily.  . Calcium Carbonate-Vitamin D (OSCAL 500/200 D-3 PO) Take by mouth.  . metFORMIN (GLUCOPHAGE) 500 MG tablet Take 1 tablet in the morning and 2 in the evening  . [DISCONTINUED] carvedilol (COREG) 6.25 MG tablet Take 1 tablet (6.25 mg total) by mouth 2 (two) times daily.  . [DISCONTINUED] metFORMIN (GLUCOPHAGE) 500 MG tablet Take 1 tablet in the morning and 2 in the evening  . carvedilol (COREG) 12.5 MG tablet Take 1 tablet (12.5 mg total) by mouth 2  (two) times daily with a meal.    EXAM:  BP 135/70 mmHg  Temp(Src) 97.9 F (36.6 C) (Oral)  Ht 5\' 1"  (1.549 m)  Wt 132 lb 6.4 oz (60.056 kg)  BMI 25.03 kg/m2  Body mass index is 25.03 kg/(m^2). bp confirmed  Elevated 200 range  GENERAL: vitals reviewed and listed above, alert, oriented, appears well hydrated and in no acute distress HEENT: atraumatic, conjunctiva  clear, no obvious abnormalities on inspection of external nose and ears OP : no lesion edema or exudate  NECK: no obvious masses on inspection palpation  LUNGS: clear to auscultation bilaterally, no wheezes, rales or rhonchi, good air movement CV: HRRR, no clubbing cyanosis or  peripheral edema nl cap refill  MS: moves all extremities without noticeable focal  abnormality PSYCH: pleasant and cooperative, no obvious depression or anxiety Lab Results  Component Value Date   WBC 9.9 03/14/2014   HGB 13.9 03/14/2014   HCT 41.0 03/14/2014   PLT 249.0 03/14/2014   GLUCOSE 133* 08/21/2014   CHOL 248* 08/21/2014   TRIG 260.0* 08/21/2014   HDL 30.80* 08/21/2014  LDLDIRECT 147.7 08/21/2014   LDLCALC 81 07/05/2013   ALT 13 08/21/2014   AST 18 08/21/2014   NA 138 08/21/2014   K 3.8 08/21/2014   CL 100 08/21/2014   CREATININE 0.9 08/21/2014   BUN 14 08/21/2014   CO2 27 08/21/2014   TSH 2.10 03/14/2014   HGBA1C 7.7* 12/01/2014   MICROALBUR 1.5 08/21/2014   BP Readings from Last 3 Encounters:  12/05/14 135/70  08/05/14 130/70  03/21/14 196/84   Wt Readings from Last 3 Encounters:  12/05/14 132 lb 6.4 oz (60.056 kg)  08/05/14 131 lb 14.4 oz (59.829 kg)  03/21/14 131 lb (59.421 kg)     ASSESSMENT AND PLAN:  Discussed the following assessment and plan:  Diabetes mellitus without complication - H6O 7.7 inc metformin to max dose as tolerated , lsi  HTN, white coat - inc coreg to 12. 5 bid with precautions and fu monitoring  Wellness in September   4 months check bp and BG.  -Patient advised to return or notify  health care team  if symptoms worsen ,persist or new concerns arise.  Patient Instructions  Increase the metformin to 4 500 mg per day   2 twice a day.   ( maximum dose )  continue to monitor Blood sugars.  Goal fasting below 120 Goal 2 hours after eating  Below 180  Acceptable but best 140- 150 and below   Increase the coreg  12.5 po twice a day.  For baseline BP .  ROV 4 months  Bmp and a1c pre visit      Standley Brooking. Pascha Fogal M.D.

## 2015-01-13 ENCOUNTER — Encounter: Payer: Self-pay | Admitting: Internal Medicine

## 2015-01-14 MED ORDER — CARVEDILOL 12.5 MG PO TABS: 12.5000 mg | ORAL_TABLET | Freq: Two times a day (BID) | ORAL | Status: DC

## 2015-04-10 ENCOUNTER — Other Ambulatory Visit (INDEPENDENT_AMBULATORY_CARE_PROVIDER_SITE_OTHER): Payer: Medicare Other

## 2015-04-10 DIAGNOSIS — I10 Essential (primary) hypertension: Secondary | ICD-10-CM

## 2015-04-10 DIAGNOSIS — E119 Type 2 diabetes mellitus without complications: Secondary | ICD-10-CM

## 2015-04-10 LAB — BASIC METABOLIC PANEL
BUN: 14 mg/dL (ref 6–23)
CO2: 32 meq/L (ref 19–32)
CREATININE: 0.92 mg/dL (ref 0.40–1.20)
Calcium: 9.3 mg/dL (ref 8.4–10.5)
Chloride: 102 mEq/L (ref 96–112)
GFR: 63.25 mL/min (ref >60.00)
Glucose, Bld: 132 mg/dL — ABNORMAL HIGH (ref 70–99)
Potassium: 5.1 mEq/L (ref 3.5–5.1)
SODIUM: 137 meq/L (ref 135–145)

## 2015-04-10 LAB — HEMOGLOBIN A1C: Hgb A1c MFr Bld: 7 % — ABNORMAL HIGH (ref 4.6–6.5)

## 2015-04-10 LAB — HM DIABETES EYE EXAM

## 2015-04-13 ENCOUNTER — Encounter: Payer: Self-pay | Admitting: Family Medicine

## 2015-04-16 ENCOUNTER — Ambulatory Visit: Payer: Medicare Other | Admitting: Internal Medicine

## 2015-04-28 ENCOUNTER — Encounter: Payer: Self-pay | Admitting: Internal Medicine

## 2015-04-28 ENCOUNTER — Encounter: Payer: Self-pay | Admitting: Family Medicine

## 2015-04-28 ENCOUNTER — Ambulatory Visit (INDEPENDENT_AMBULATORY_CARE_PROVIDER_SITE_OTHER): Payer: Medicare Other | Admitting: Internal Medicine

## 2015-04-28 VITALS — BP 190/90 | Temp 98.2°F | Ht 61.5 in | Wt 128.9 lb

## 2015-04-28 DIAGNOSIS — R030 Elevated blood-pressure reading, without diagnosis of hypertension: Secondary | ICD-10-CM

## 2015-04-28 DIAGNOSIS — Z79899 Other long term (current) drug therapy: Secondary | ICD-10-CM | POA: Diagnosis not present

## 2015-04-28 DIAGNOSIS — E119 Type 2 diabetes mellitus without complications: Secondary | ICD-10-CM | POA: Diagnosis not present

## 2015-04-28 DIAGNOSIS — IMO0001 Reserved for inherently not codable concepts without codable children: Secondary | ICD-10-CM

## 2015-04-28 DIAGNOSIS — L819 Disorder of pigmentation, unspecified: Secondary | ICD-10-CM

## 2015-04-28 LAB — HM DIABETES EYE EXAM

## 2015-04-28 NOTE — Patient Instructions (Signed)
Make sure your bp is  In range     Below 140/90 .   Continue   Hg a1c   Is getting better .  Wellness visit  In 4-6 months  With labs pre  visit and urine microalbumin.

## 2015-04-28 NOTE — Progress Notes (Signed)
Pre visit review using our clinic review tool, if applicable. No additional management support is needed unless otherwise documented below in the visit note.  Chief Complaint  Patient presents with  . Follow-up    HPI: Sharon Wilson 74 y.o.  comes in for chronic disease/ medication management   Lots of stress  This past 6 months   .    Just came back from beach.    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    Ok   BP at home  Was 107/60    And then 140/70 .   And up here.  As usual .    DM: trying   Taking 1500 metformin wo side effects.lsi  Has irritated itchy mole on back for a while uncertain needs to be checked  No Cp sob syncope neuropathy ROS: See pertinent positives and negatives per HPI.No  Past Medical History  Diagnosis Date  . Diabetes mellitus   . White coat hypertension   . OA (osteoarthritis)   . Diverticulosis   . Hyperlipidemia   . Anemia     hysterectomy    Family History  Problem Relation Age of Onset  . Cancer Mother     breast    History   Social History  . Marital Status: Married    Spouse Name: N/A  . Number of Children: N/A  . Years of Education: N/A   Social History Main Topics  . Smoking status: Never Smoker   . Smokeless tobacco: Not on file  . Alcohol Use: Not on file  . Drug Use: Not on file  . Sexual Activity: Not on file   Other Topics Concern  . None   Social History Narrative   Married husband no retired   hh of 2   No pets    Outpatient Prescriptions Prior to Visit  Medication Sig Dispense Refill  . aspirin 81 MG tablet Take 81 mg by mouth daily.    . Calcium Carbonate-Vitamin D (OSCAL 500/200 D-3 PO) Take by mouth.    . carvedilol (COREG) 12.5 MG tablet Take 1 tablet (12.5 mg total) by mouth 2 (two) times daily with a meal. 180 tablet 0  . metFORMIN (GLUCOPHAGE) 500 MG tablet Take 1 tablet in the morning and 2 in the evening 360 tablet 3   No facility-administered medications prior to visit.      EXAM:  BP 190/90 mmHg  Temp(Src) 98.2 F (36.8 C) (Oral)  Ht 5' 1.5" (1.562 m)  Wt 128 lb 14.4 oz (58.469 kg)  BMI 23.96 kg/m2  Body mass index is 23.96 kg/(m^2).  GENERAL: vitals reviewed and listed above, alert, oriented, appears well hydrated and in no acute distress HEENT: atraumatic, conjunctiva  clear, no obvious abnormalities on inspection of external nose and ears NECK: no obvious masses on inspection palpation  LUNGS: clear to auscultation bilaterally, no wheezes, rales or rhonchi, good air movement CV: HRRR, no clubbing cyanosis or  peripheral edema nl cap refill  MS: moves all extremities without noticeable focal  Abnormality Skin ;  Mid back there is a 3 mm raised keratotic lesion with pink based  No bleeding  PSYCH: pleasant and cooperative, no obvious depression or anxiety Lab Results  Component Value Date   WBC 9.9 03/14/2014   HGB 13.9 03/14/2014   HCT 41.0 03/14/2014   PLT 249.0 03/14/2014   GLUCOSE 132* 04/10/2015   CHOL 248* 08/21/2014   TRIG 260.0* 08/21/2014  HDL 30.80* 08/21/2014   LDLDIRECT 147.7 08/21/2014   LDLCALC 81 07/05/2013   ALT 13 08/21/2014   AST 18 08/21/2014   NA 137 04/10/2015   K 5.1 04/10/2015   CL 102 04/10/2015   CREATININE 0.92 04/10/2015   BUN 14 04/10/2015   CO2 32 04/10/2015   TSH 2.10 03/14/2014   HGBA1C 7.0* 04/10/2015   MICROALBUR 1.5 08/21/2014   Diabetes Health Maintenance Due  Topic Date Due  . FOOT EXAM  08/06/2015  . URINE MICROALBUMIN  08/22/2015  . HEMOGLOBIN A1C  10/10/2015  . OPHTHALMOLOGY EXAM  04/27/2016   BP Readings from Last 3 Encounters:  04/28/15 190/90  12/05/14 135/70  08/05/14 130/70   Wt Readings from Last 3 Encounters:  04/28/15 128 lb 14.4 oz (58.469 kg)  12/05/14 132 lb 6.4 oz (60.056 kg)  08/05/14 131 lb 14.4 oz (59.829 kg)      ASSESSMENT AND PLAN:  Discussed the following assessment and plan:  Diabetes mellitus without complication - better W9N continue  ls and  meds  scaly skin lesion of uncertain nature - options for removal or derm eval etc will refer for opinion - Plan: Ambulatory referral to Dermatology  HTN, white coat - baseline  monitoring normal can consdier inc med but pt feels controlled at home  Medication management  -Patient advised to return or notify health care team  if symptoms worsen ,persist or new concerns arise.  Patient Instructions  Make sure your bp is  In range     Below 140/90 .   Continue   Hg a1c   Is getting better .  Wellness visit  In 4-6 months  With labs pre  visit and urine microalbumin.      Standley Brooking. Jania Steinke M.D.

## 2015-05-12 ENCOUNTER — Encounter: Payer: Self-pay | Admitting: Internal Medicine

## 2015-08-11 ENCOUNTER — Other Ambulatory Visit: Payer: Self-pay

## 2015-08-11 DIAGNOSIS — Z1231 Encounter for screening mammogram for malignant neoplasm of breast: Secondary | ICD-10-CM

## 2015-08-19 ENCOUNTER — Other Ambulatory Visit: Payer: Self-pay | Admitting: Internal Medicine

## 2015-08-19 NOTE — Telephone Encounter (Signed)
Sent to the pharmacy by e-scribe.  Pt has upcoming appt 11/16.

## 2015-09-15 ENCOUNTER — Ambulatory Visit
Admission: RE | Admit: 2015-09-15 | Discharge: 2015-09-15 | Disposition: A | Discharge status: Home / Self Care | Payer: Medicare Other | Source: Ambulatory Visit

## 2015-09-15 DIAGNOSIS — Z1231 Encounter for screening mammogram for malignant neoplasm of breast: Secondary | ICD-10-CM

## 2015-09-29 ENCOUNTER — Other Ambulatory Visit (INDEPENDENT_AMBULATORY_CARE_PROVIDER_SITE_OTHER): Payer: Medicare Other

## 2015-09-29 DIAGNOSIS — R7989 Other specified abnormal findings of blood chemistry: Secondary | ICD-10-CM | POA: Diagnosis not present

## 2015-09-29 DIAGNOSIS — I10 Essential (primary) hypertension: Secondary | ICD-10-CM | POA: Diagnosis not present

## 2015-09-29 DIAGNOSIS — E785 Hyperlipidemia, unspecified: Secondary | ICD-10-CM | POA: Diagnosis not present

## 2015-09-29 DIAGNOSIS — Z0000 Encounter for general adult medical examination without abnormal findings: Secondary | ICD-10-CM | POA: Diagnosis not present

## 2015-09-29 LAB — LIPID PANEL
Cholesterol: 230 mg/dL — ABNORMAL HIGH (ref 0–200)
HDL: 34.7 mg/dL — AB (ref >39.00)
NonHDL: 195.54
TRIGLYCERIDES: 218 mg/dL — AB (ref 0.0–149.0)
Total CHOL/HDL Ratio: 7
VLDL: 43.6 mg/dL — AB (ref 0.0–40.0)

## 2015-09-29 LAB — BASIC METABOLIC PANEL
BUN: 15 mg/dL (ref 6–23)
CALCIUM: 9.2 mg/dL (ref 8.4–10.5)
CO2: 30 meq/L (ref 19–32)
Chloride: 100 mEq/L (ref 96–112)
Creatinine, Ser: 0.84 mg/dL (ref 0.40–1.20)
GFR: 70.17 mL/min (ref >60.00)
Glucose, Bld: 139 mg/dL — ABNORMAL HIGH (ref 70–99)
POTASSIUM: 3.5 meq/L (ref 3.5–5.1)
SODIUM: 138 meq/L (ref 135–145)

## 2015-09-29 LAB — TSH: TSH: 4.13 u[IU]/mL (ref 0.35–4.50)

## 2015-09-29 LAB — CBC WITH DIFFERENTIAL/PLATELET
Basophils Absolute: 0 10*3/uL (ref 0.0–0.1)
Basophils Relative: 0.5 % (ref 0.0–3.0)
Eosinophils Absolute: 0.4 10*3/uL (ref 0.0–0.7)
Eosinophils Relative: 7.1 % — ABNORMAL HIGH (ref 0.0–5.0)
HEMATOCRIT: 39.1 % (ref 36.0–46.0)
Hemoglobin: 13 g/dL (ref 12.0–15.0)
LYMPHS PCT: 18.4 % (ref 12.0–46.0)
Lymphs Abs: 1.1 10*3/uL (ref 0.7–4.0)
MCHC: 33.4 g/dL (ref 30.0–36.0)
MCV: 89.5 fl (ref 78.0–100.0)
MONOS PCT: 7.3 % (ref 3.0–12.0)
Monocytes Absolute: 0.4 10*3/uL (ref 0.1–1.0)
NEUTROS ABS: 3.9 10*3/uL (ref 1.4–7.7)
Neutrophils Relative %: 66.7 % (ref 43.0–77.0)
PLATELETS: 232 10*3/uL (ref 150.0–400.0)
RBC: 4.36 Mil/uL (ref 3.87–5.11)
RDW: 13.1 % (ref 11.5–15.5)
WBC: 5.9 10*3/uL (ref 4.0–10.5)

## 2015-09-29 LAB — HEPATIC FUNCTION PANEL
ALT: 10 U/L (ref 0–35)
AST: 15 U/L (ref 0–37)
Albumin: 4.1 g/dL (ref 3.5–5.2)
Alkaline Phosphatase: 61 U/L (ref 39–117)
Bilirubin, Direct: 0.1 mg/dL (ref 0.0–0.3)
TOTAL PROTEIN: 6.8 g/dL (ref 6.0–8.3)
Total Bilirubin: 0.6 mg/dL (ref 0.2–1.2)

## 2015-09-29 LAB — MICROALBUMIN / CREATININE URINE RATIO
Creatinine,U: 162.3 mg/dL
Microalb Creat Ratio: 1.7 mg/g (ref 0.0–30.0)
Microalb, Ur: 2.7 mg/dL — ABNORMAL HIGH (ref 0.0–1.9)

## 2015-09-29 LAB — HEMOGLOBIN A1C: Hgb A1c MFr Bld: 7.5 % — ABNORMAL HIGH (ref 4.6–6.5)

## 2015-09-29 LAB — LDL CHOLESTEROL, DIRECT: Direct LDL: 157 mg/dL

## 2015-10-06 ENCOUNTER — Ambulatory Visit (INDEPENDENT_AMBULATORY_CARE_PROVIDER_SITE_OTHER): Payer: Medicare Other | Admitting: Internal Medicine

## 2015-10-06 ENCOUNTER — Encounter: Payer: Self-pay | Admitting: Internal Medicine

## 2015-10-06 VITALS — BP 120/80 | Temp 98.5°F | Ht 61.5 in | Wt 131.9 lb

## 2015-10-06 DIAGNOSIS — IMO0001 Reserved for inherently not codable concepts without codable children: Secondary | ICD-10-CM

## 2015-10-06 DIAGNOSIS — Z0000 Encounter for general adult medical examination without abnormal findings: Secondary | ICD-10-CM | POA: Diagnosis not present

## 2015-10-06 DIAGNOSIS — I10 Essential (primary) hypertension: Secondary | ICD-10-CM

## 2015-10-06 DIAGNOSIS — E785 Hyperlipidemia, unspecified: Secondary | ICD-10-CM

## 2015-10-06 DIAGNOSIS — R030 Elevated blood-pressure reading, without diagnosis of hypertension: Secondary | ICD-10-CM

## 2015-10-06 DIAGNOSIS — E1165 Type 2 diabetes mellitus with hyperglycemia: Secondary | ICD-10-CM | POA: Diagnosis not present

## 2015-10-06 DIAGNOSIS — E119 Type 2 diabetes mellitus without complications: Secondary | ICD-10-CM

## 2015-10-06 MED ORDER — CARVEDILOL 12.5 MG PO TABS: 12.5000 mg | ORAL_TABLET | Freq: Two times a day (BID) | ORAL | Status: DC

## 2015-10-06 MED ORDER — METFORMIN HCL 500 MG PO TABS: ORAL_TABLET | ORAL | Status: DC

## 2015-10-06 MED ORDER — EZETIMIBE 10 MG PO TABS: 10.0000 mg | ORAL_TABLET | Freq: Every day | ORAL | Status: DC

## 2015-10-06 NOTE — Assessment & Plan Note (Signed)
Halloween effect   Intensify lifestyle interventions. Inc metformin to 2000 mg per day  rehceck in  3-4 months  Disc many other options  meds etc

## 2015-10-06 NOTE — Patient Instructions (Addendum)
Intensify lifestyle interventions. To help with the diabetes control Continue bp monitoring  As you are doing.  Trial generic zetia   For lipids  Not a statin and may be helpful  There are other options.  Increase metformin to 4  500 mg per day ( max    Can do 2 and 2  Or 1 1 and 2 )   rov in 3-4 months   Lab pre visit    Health Maintenance, Female Adopting a healthy lifestyle and getting preventive care can go a long way to promote health and wellness. Talk with your health care provider about what schedule of regular examinations is right for you. This is a good chance for you to check in with your provider about disease prevention and staying healthy. In between checkups, there are plenty of things you can do on your own. Experts have done a lot of research about which lifestyle changes and preventive measures are most likely to keep you healthy. Ask your health care provider for more information. WEIGHT AND DIET  Eat a healthy diet  Be sure to include plenty of vegetables, fruits, low-fat dairy products, and lean protein.  Do not eat a lot of foods high in solid fats, added sugars, or salt.  Get regular exercise. This is one of the most important things you can do for your health.  Most adults should exercise for at least 150 minutes each week. The exercise should increase your heart rate and make you sweat (moderate-intensity exercise).  Most adults should also do strengthening exercises at least twice a week. This is in addition to the moderate-intensity exercise.  Maintain a healthy weight  Body mass index (BMI) is a measurement that can be used to identify possible weight problems. It estimates body fat based on height and weight. Your health care provider can help determine your BMI and help you achieve or maintain a healthy weight.  For females 66 years of age and older:   A BMI below 18.5 is considered underweight.  A BMI of 18.5 to 24.9 is normal.  A BMI of 25 to 29.9  is considered overweight.  A BMI of 30 and above is considered obese.  Watch levels of cholesterol and blood lipids  You should start having your blood tested for lipids and cholesterol at 75 years of age, then have this test every 5 years.  You may need to have your cholesterol levels checked more often if:  Your lipid or cholesterol levels are high.  You are older than 76 years of age.  You are at high risk for heart disease.  CANCER SCREENING   Lung Cancer  Lung cancer screening is recommended for adults 62-1 years old who are at high risk for lung cancer because of a history of smoking.  A yearly low-dose CT scan of the lungs is recommended for people who:  Currently smoke.  Have quit within the past 15 years.  Have at least a 30-pack-year history of smoking. A pack year is smoking an average of one pack of cigarettes a day for 1 year.  Yearly screening should continue until it has been 15 years since you quit.  Yearly screening should stop if you develop a health problem that would prevent you from having lung cancer treatment.  Breast Cancer  Practice breast self-awareness. This means understanding how your breasts normally appear and feel.  It also means doing regular breast self-exams. Let your health care provider know about any changes,  no matter how small.  If you are in your 20s or 30s, you should have a clinical breast exam (CBE) by a health care provider every 1-3 years as part of a regular health exam.  If you are 37 or older, have a CBE every year. Also consider having a breast X-ray (mammogram) every year.  If you have a family history of breast cancer, talk to your health care provider about genetic screening.  If you are at high risk for breast cancer, talk to your health care provider about having an MRI and a mammogram every year.  Breast cancer gene (BRCA) assessment is recommended for women who have family members with BRCA-related cancers.  BRCA-related cancers include:  Breast.  Ovarian.  Tubal.  Peritoneal cancers.  Results of the assessment will determine the need for genetic counseling and BRCA1 and BRCA2 testing. Cervical Cancer Your health care provider may recommend that you be screened regularly for cancer of the pelvic organs (ovaries, uterus, and vagina). This screening involves a pelvic examination, including checking for microscopic changes to the surface of your cervix (Pap test). You may be encouraged to have this screening done every 3 years, beginning at age 4.  For women ages 7-65, health care providers may recommend pelvic exams and Pap testing every 3 years, or they may recommend the Pap and pelvic exam, combined with testing for human papilloma virus (HPV), every 5 years. Some types of HPV increase your risk of cervical cancer. Testing for HPV may also be done on women of any age with unclear Pap test results.  Other health care providers may not recommend any screening for nonpregnant women who are considered low risk for pelvic cancer and who do not have symptoms. Ask your health care provider if a screening pelvic exam is right for you.  If you have had past treatment for cervical cancer or a condition that could lead to cancer, you need Pap tests and screening for cancer for at least 20 years after your treatment. If Pap tests have been discontinued, your risk factors (such as having a new sexual partner) need to be reassessed to determine if screening should resume. Some women have medical problems that increase the chance of getting cervical cancer. In these cases, your health care provider may recommend more frequent screening and Pap tests. Colorectal Cancer  This type of cancer can be detected and often prevented.  Routine colorectal cancer screening usually begins at 75 years of age and continues through 75 years of age.  Your health care provider may recommend screening at an earlier age if you  have risk factors for colon cancer.  Your health care provider may also recommend using home test kits to check for hidden blood in the stool.  A small camera at the end of a tube can be used to examine your colon directly (sigmoidoscopy or colonoscopy). This is done to check for the earliest forms of colorectal cancer.  Routine screening usually begins at age 52.  Direct examination of the colon should be repeated every 5-10 years through 75 years of age. However, you may need to be screened more often if early forms of precancerous polyps or small growths are found. Skin Cancer  Check your skin from head to toe regularly.  Tell your health care provider about any new moles or changes in moles, especially if there is a change in a mole's shape or color.  Also tell your health care provider if you have a mole  that is larger than the size of a pencil eraser.  Always use sunscreen. Apply sunscreen liberally and repeatedly throughout the day.  Protect yourself by wearing long sleeves, pants, a wide-brimmed hat, and sunglasses whenever you are outside. HEART DISEASE, DIABETES, AND HIGH BLOOD PRESSURE   High blood pressure causes heart disease and increases the risk of stroke. High blood pressure is more likely to develop in:  People who have blood pressure in the high end of the normal range (130-139/85-89 mm Hg).  People who are overweight or obese.  People who are African American.  If you are 59-8 years of age, have your blood pressure checked every 3-5 years. If you are 34 years of age or older, have your blood pressure checked every year. You should have your blood pressure measured twice--once when you are at a hospital or clinic, and once when you are not at a hospital or clinic. Record the average of the two measurements. To check your blood pressure when you are not at a hospital or clinic, you can use:  An automated blood pressure machine at a pharmacy.  A home blood pressure  monitor.  If you are between 20 years and 17 years old, ask your health care provider if you should take aspirin to prevent strokes.  Have regular diabetes screenings. This involves taking a blood sample to check your fasting blood sugar level.  If you are at a normal weight and have a low risk for diabetes, have this test once every three years after 75 years of age.  If you are overweight and have a high risk for diabetes, consider being tested at a younger age or more often. PREVENTING INFECTION  Hepatitis B  If you have a higher risk for hepatitis B, you should be screened for this virus. You are considered at high risk for hepatitis B if:  You were born in a country where hepatitis B is common. Ask your health care provider which countries are considered high risk.  Your parents were born in a high-risk country, and you have not been immunized against hepatitis B (hepatitis B vaccine).  You have HIV or AIDS.  You use needles to inject street drugs.  You live with someone who has hepatitis B.  You have had sex with someone who has hepatitis B.  You get hemodialysis treatment.  You take certain medicines for conditions, including cancer, organ transplantation, and autoimmune conditions. Hepatitis C  Blood testing is recommended for:  Everyone born from [birth year] through 1966.  Anyone with known risk factors for hepatitis C. Sexually transmitted infections (STIs)  You should be screened for sexually transmitted infections (STIs) including gonorrhea and chlamydia if:  You are sexually active and are younger than 75 years of age.  You are older than 75 years of age and your health care provider tells you that you are at risk for this type of infection.  Your sexual activity has changed since you were last screened and you are at an increased risk for chlamydia or gonorrhea. Ask your health care provider if you are at risk.  If you do not have HIV, but are at risk, it may be  recommended that you take a prescription medicine daily to prevent HIV infection. This is called pre-exposure prophylaxis (PrEP). You are considered at risk if:  You are sexually active and do not regularly use condoms or know the HIV status of your partner(s).  You take drugs by injection.  You are sexually  active with a partner who has HIV. Talk with your health care provider about whether you are at high risk of being infected with HIV. If you choose to begin PrEP, you should first be tested for HIV. You should then be tested every 3 months for as long as you are taking PrEP.  PREGNANCY   If you are premenopausal and you may become pregnant, ask your health care provider about preconception counseling.  If you may become pregnant, take 400 to 800 micrograms (mcg) of folic acid every day.  If you want to prevent pregnancy, talk to your health care provider about birth control (contraception). OSTEOPOROSIS AND MENOPAUSE   Osteoporosis is a disease in which the bones lose minerals and strength with aging. This can result in serious bone fractures. Your risk for osteoporosis can be identified using a bone density scan.  If you are 39 years of age or older, or if you are at risk for osteoporosis and fractures, ask your health care provider if you should be screened.  Ask your health care provider whether you should take a calcium or vitamin D supplement to lower your risk for osteoporosis.  Menopause may have certain physical symptoms and risks.  Hormone replacement therapy may reduce some of these symptoms and risks. Talk to your health care provider about whether hormone replacement therapy is right for you.  HOME CARE INSTRUCTIONS   Schedule regular health, dental, and eye exams.  Stay current with your immunizations.   Do not use any tobacco products including cigarettes, chewing tobacco, or electronic cigarettes.  If you are pregnant, do not drink alcohol.  If you are  breastfeeding, limit how much and how often you drink alcohol.  Limit alcohol intake to no more than 1 drink per day for nonpregnant women. One drink equals 12 ounces of beer, 5 ounces of wine, or 1 ounces of hard liquor.  Do not use street drugs.  Do not share needles.  Ask your health care provider for help if you need support or information about quitting drugs.  Tell your health care provider if you often feel depressed.  Tell your health care provider if you have ever been abused or do not feel safe at home.   This information is not intended to replace advice given to you by your health care provider. Make sure you discuss any questions you have with your health care provider.   Document Released: 05/09/2011 Document Revised: 11/14/2014 Document Reviewed: 09/25/2013 Elsevier Interactive Patient Education [employer].

## 2015-10-06 NOTE — Assessment & Plan Note (Signed)
rx given to try zetia if agrees recheck lipids in 4 months

## 2015-10-06 NOTE — Progress Notes (Signed)
Pre visit review using our clinic review tool, if applicable. No additional management support is needed unless otherwise documented below in the visit note.  Chief Complaint  Patient presents with  . Medicare Wellness    HPI: Sharon Wilson 74 y.o. comes in today for Preventive Medicare wellness visit . An d.cdm DM at e lots of candy at halloween no vision neuro sx  BP good at home  120 118 range this am  Continues to monitor  Just goes up here . Less exercise knee limiting  Is statin intolerant and has tried 4   Hesitant to try others    Health Maintenance  Topic Date Due  . FOOT EXAM  08/06/2015  . INFLUENZA VACCINE  04/06/2016 (Originally 06/08/2015)  . HEMOGLOBIN A1C  03/28/2016  . OPHTHALMOLOGY EXAM  04/27/2016  . URINE MICROALBUMIN  09/28/2016  . COLONOSCOPY  10/07/2017  . TETANUS/TDAP  08/22/2019  . DEXA SCAN  Completed  . ZOSTAVAX  Completed  . PNA vac Low Risk Adult  Completed   Health Maintenance Review LIFESTYLE:  TADn  Sugar beverages:n Sleep: 6-7 hours     MEDICARE DOCUMENT QUESTIONS  TO SCAN   Hearing: ok  Vision:  No limitations at present . Last eye check UTD  Safety:  Has smoke detector and wears seat belts.  No firearms. No excess sun exposure. Sees dentist regularly.  Falls: n  Advance directive :  Reviewed  Has information hasnt signed  Memory: Felt to be good  , no concern from her or her family.  Depression: No anhedonia unusual crying or depressive symptoms  Nutrition: Eats well balanced diet; adequate calcium and vitamin D. No swallowing chewing problems.  Injury: no major injuries in the last six months.  Other healthcare providers:  Reviewed today .  Social:  Lives with spouse married.    Preventive parameters: up-to-date  Reviewed   ADLS:   There are no problems or need for assistance  driving, feeding, obtaining food, dressing, toileting and bathing, managing money using phone. She is independent.    ROS:  GEN/  HEENT: No fever, significant weight changes sweats headaches vision problems hearing changes, CV/ PULM; No chest pain shortness of breath cough, syncope,edema  change in exercise tolerance. GI /GU: No adominal pain, vomiting, change in bowel habits. No blood in the stool. No significant GU symptoms. SKIN/HEME: ,no acute skin rashes suspicious lesions or bleeding. No lymphadenopathy, nodules, masses.  NEURO/ PSYCH:  No neurologic signs such as weakness numbness. No depression anxiety. IMM/ Allergy: No unusual infections.  Allergy .   REST of 12 system review negative except as per HPI   Past Medical History  Diagnosis Date  . Diabetes mellitus   . White coat hypertension   . OA (osteoarthritis)   . Diverticulosis   . Hyperlipidemia   . Anemia     hysterectomy    Family History  Problem Relation Age of Onset  . Cancer Mother     breast    Social History   Social History  . Marital Status: Married    Spouse Name: N/A  . Number of Children: N/A  . Years of Education: N/A   Social History Main Topics  . Smoking status: Never Smoker   . Smokeless tobacco: None  . Alcohol Use: None  . Drug Use: None  . Sexual Activity: Not Asked   Other Topics Concern  . None   Social History Narrative   Married husband no retired   hh  of 2   No pets    Outpatient Encounter Prescriptions as of 10/06/2015  Medication Sig  . aspirin 81 MG tablet Take 81 mg by mouth daily.  . Calcium Carbonate-Vitamin D (OSCAL 500/200 D-3 PO) Take by mouth.  . carvedilol (COREG) 12.5 MG tablet Take 1 tablet (12.5 mg total) by mouth 2 (two) times daily with a meal.  . metFORMIN (GLUCOPHAGE) 500 MG tablet Take 2 tablet in the morning and 2 in the evening  . [DISCONTINUED] carvedilol (COREG) 12.5 MG tablet Take 1 tablet (12.5 mg total) by mouth 2 (two) times daily with a meal.  . [DISCONTINUED] carvedilol (COREG) 12.5 MG tablet TAKE ONE TABLET BY MOUTH TWICE DAILY WITH A MEAL  . [DISCONTINUED] metFORMIN  (GLUCOPHAGE) 500 MG tablet Take 1 tablet in the morning and 2 in the evening  . ezetimibe (ZETIA) 10 MG tablet Take 1 tablet (10 mg total) by mouth daily.   No facility-administered encounter medications on file as of 10/06/2015.    EXAM:  BP 120/80 mmHg  Temp(Src) 98.5 F (36.9 C) (Oral)  Ht 5' 1.5" (1.562 m)  Wt 131 lb 14.4 oz (59.829 kg)  BMI 24.52 kg/m2  Body mass index is 24.52 kg/(m^2).  Physical Exam: Vital signs reviewed CNO:BSJG is a well-developed well-nourished alert cooperative   who appears stated age in no acute distress.  HEENT: normocephalic atraumatic , Eyes: PERRL EOM's full, conjunctiva clear, Nares: paten,t no deformity discharge or tenderness., Ears: no deformity EAC's clear TMs with normal landmarks. Mouth: clear OP, no lesions, edema.  Moist mucous membranes. Dentition in adequate repair. NECK: supple without masses, thyromegaly or bruits. CHEST/PULM:  Clear to auscultation and percussion breath sounds equal no wheeze , rales or rhonchi. No chest wall deformities or tenderness.Breast: normal by inspection . No dimpling, discharge, masses, tenderness or discharge . CV: PMI is nondisplaced, S1 S2 no gallops, murmurs, rubs. Peripheral pulses are full without delay.No JVD .  ABDOMEN: Bowel sounds normal nontender  No guard or rebound, no hepato splenomegal no CVA tenderness.   Extremtities:  No clubbing cyanosis or edema, no acute joint swelling or redness no focal atrophy NEURO:  Oriented x3, cranial nerves 3-12 appear to be intact, no obvious focal weakness,gait within normal limits no abnormal reflexes or asymmetrical SKIN: No acute rashes normal turgor, color, no bruising or petechiae. PSYCH: Oriented, good eye contact, no obvious depression anxiety, cognition and judgment appear normal. LN: no cervical axillary inguinal adenopathy No noted deficits in memory, attention, and speech.   Lab Results  Component Value Date   WBC 5.9 09/29/2015   HGB 13.0  09/29/2015   HCT 39.1 09/29/2015   PLT 232.0 09/29/2015   GLUCOSE 139* 09/29/2015   CHOL 230* 09/29/2015   TRIG 218.0* 09/29/2015   HDL 34.70* 09/29/2015   LDLDIRECT 157.0 09/29/2015   LDLCALC 81 07/05/2013   ALT 10 09/29/2015   AST 15 09/29/2015   NA 138 09/29/2015   K 3.5 09/29/2015   CL 100 09/29/2015   CREATININE 0.84 09/29/2015   BUN 15 09/29/2015   CO2 30 09/29/2015   TSH 4.13 09/29/2015   HGBA1C 7.5* 09/29/2015   MICROALBUR 2.7* 09/29/2015   BP Readings from Last 3 Encounters:  10/06/15 120/80  04/28/15 190/90  12/05/14 135/70   Diabetic Foot Exam - Simple   Simple Foot Form  Diabetic Foot exam was performed with the following findings:  Yes 10/06/2015 10:02 AM  Visual Inspection  No deformities, no ulcerations, no other skin breakdown  bilaterally:  Yes  Sensation Testing  Intact to touch and monofilament testing bilaterally:  Yes  Pulse Check  Posterior Tibialis and Dorsalis pulse intact bilaterally:  Yes  Comments      Labs reviewed  ASSESSMENT AND PLAN:  Discussed the following assessment and plan:  Visit for preventive health examination  Medicare annual wellness visit, subsequent  Diabetes mellitus without complication ([place])  Uncontrolled type 2 diabetes mellitus without complication, without long-term current use of insulin (HCC)  HTN, white coat  Essential hypertension  HLD (hyperlipidemia)  Patient Care Team: [name], MD as PCP - General Sable Feil, MD as Attending Physician (Gastroenterology) Marygrace Drought, MD as Attending Physician (Ophthalmology) Milus Height, MD as Referring Physician (Ophthalmology)  Patient Instructions  Intensify lifestyle interventions. To help with the diabetes control Continue bp monitoring  As you are doing.  Trial generic zetia   For lipids  Not a statin and may be helpful  There are other options.  Increase metformin to 4  500 mg per day ( max    Can do 2 and 2  Or 1 1 and 2 )   rov  in 3-4 months   Lab pre visit    Health Maintenance, Female Adopting a healthy lifestyle and getting preventive care can go a long way to promote health and wellness. Talk with your health care provider about what schedule of regular examinations is right for you. This is a good chance for you to check in with your provider about disease prevention and staying healthy. In between checkups, there are plenty of things you can do on your own. Experts have done a lot of research about which lifestyle changes and preventive measures are most likely to keep you healthy. Ask your health care provider for more information. WEIGHT AND DIET  Eat a healthy diet  Be sure to include plenty of vegetables, fruits, low-fat dairy products, and lean protein.  Do not eat a lot of foods high in solid fats, added sugars, or salt.  Get regular exercise. This is one of the most important things you can do for your health.  Most adults should exercise for at least 150 minutes each week. The exercise should increase your heart rate and make you sweat (moderate-intensity exercise).  Most adults should also do strengthening exercises at least twice a week. This is in addition to the moderate-intensity exercise.  Maintain a healthy weight  Body mass index (BMI) is a measurement that can be used to identify possible weight problems. It estimates body fat based on height and weight. Your health care provider can help determine your BMI and help you achieve or maintain a healthy weight.  For females 45 years of age and older:   A BMI below 18.5 is considered underweight.  A BMI of 18.5 to 24.9 is normal.  A BMI of 25 to 29.9 is considered overweight.  A BMI of 30 and above is considered obese.  Watch levels of cholesterol and blood lipids  You should start having your blood tested for lipids and cholesterol at 75 years of age, then have this test every 5 years.  You may need to have your cholesterol levels  checked more often if:  Your lipid or cholesterol levels are high.  You are older than 75 years of age.  You are at high risk for heart disease.  CANCER SCREENING   Lung Cancer  Lung cancer screening is recommended for adults 55-69 years old who are  at high risk for lung cancer because of a history of smoking.  A yearly low-dose CT scan of the lungs is recommended for people who:  Currently smoke.  Have quit within the past 15 years.  Have at least a 30-pack-year history of smoking. A pack year is smoking an average of one pack of cigarettes a day for 1 year.  Yearly screening should continue until it has been 15 years since you quit.  Yearly screening should stop if you develop a health problem that would prevent you from having lung cancer treatment.  Breast Cancer  Practice breast self-awareness. This means understanding how your breasts normally appear and feel.  It also means doing regular breast self-exams. Let your health care provider know about any changes, no matter how small.  If you are in your 20s or 30s, you should have a clinical breast exam (CBE) by a health care provider every 1-3 years as part of a regular health exam.  If you are 13 or older, have a CBE every year. Also consider having a breast X-ray (mammogram) every year.  If you have a family history of breast cancer, talk to your health care provider about genetic screening.  If you are at high risk for breast cancer, talk to your health care provider about having an MRI and a mammogram every year.  Breast cancer gene (BRCA) assessment is recommended for women who have family members with BRCA-related cancers. BRCA-related cancers include:  Breast.  Ovarian.  Tubal.  Peritoneal cancers.  Results of the assessment will determine the need for genetic counseling and BRCA1 and BRCA2 testing. Cervical Cancer Your health care provider may recommend that you be screened regularly for cancer of the  pelvic organs (ovaries, uterus, and vagina). This screening involves a pelvic examination, including checking for microscopic changes to the surface of your cervix (Pap test). You may be encouraged to have this screening done every 3 years, beginning at age 57.  For women ages 42-65, health care providers may recommend pelvic exams and Pap testing every 3 years, or they may recommend the Pap and pelvic exam, combined with testing for human papilloma virus (HPV), every 5 years. Some types of HPV increase your risk of cervical cancer. Testing for HPV may also be done on women of any age with unclear Pap test results.  Other health care providers may not recommend any screening for nonpregnant women who are considered low risk for pelvic cancer and who do not have symptoms. Ask your health care provider if a screening pelvic exam is right for you.  If you have had past treatment for cervical cancer or a condition that could lead to cancer, you need Pap tests and screening for cancer for at least 20 years after your treatment. If Pap tests have been discontinued, your risk factors (such as having a new sexual partner) need to be reassessed to determine if screening should resume. Some women have medical problems that increase the chance of getting cervical cancer. In these cases, your health care provider may recommend more frequent screening and Pap tests. Colorectal Cancer  This type of cancer can be detected and often prevented.  Routine colorectal cancer screening usually begins at 75 years of age and continues through 75 years of age.  Your health care provider may recommend screening at an earlier age if you have risk factors for colon cancer.  Your health care provider may also recommend using home test kits to check for hidden blood  in the stool.  A small camera at the end of a tube can be used to examine your colon directly (sigmoidoscopy or colonoscopy). This is done to check for the earliest  forms of colorectal cancer.  Routine screening usually begins at age 47.  Direct examination of the colon should be repeated every 5-10 years through 75 years of age. However, you may need to be screened more often if early forms of precancerous polyps or small growths are found. Skin Cancer  Check your skin from head to toe regularly.  Tell your health care provider about any new moles or changes in moles, especially if there is a change in a mole's shape or color.  Also tell your health care provider if you have a mole that is larger than the size of a pencil eraser.  Always use sunscreen. Apply sunscreen liberally and repeatedly throughout the day.  Protect yourself by wearing long sleeves, pants, a wide-brimmed hat, and sunglasses whenever you are outside. HEART DISEASE, DIABETES, AND HIGH BLOOD PRESSURE   High blood pressure causes heart disease and increases the risk of stroke. High blood pressure is more likely to develop in:  People who have blood pressure in the high end of the normal range (130-139/85-89 mm Hg).  People who are overweight or obese.  People who are African American.  If you are 75-66 years of age, have your blood pressure checked every 3-5 years. If you are 44 years of age or older, have your blood pressure checked every year. You should have your blood pressure measured twice--once when you are at a hospital or clinic, and once when you are not at a hospital or clinic. Record the average of the two measurements. To check your blood pressure when you are not at a hospital or clinic, you can use:  An automated blood pressure machine at a pharmacy.  A home blood pressure monitor.  If you are between 32 years and 71 years old, ask your health care provider if you should take aspirin to prevent strokes.  Have regular diabetes screenings. This involves taking a blood sample to check your fasting blood sugar level.  If you are at a normal weight and have a low  risk for diabetes, have this test once every three years after 75 years of age.  If you are overweight and have a high risk for diabetes, consider being tested at a younger age or more often. PREVENTING INFECTION  Hepatitis B  If you have a higher risk for hepatitis B, you should be screened for this virus. You are considered at high risk for hepatitis B if:  You were born in a country where hepatitis B is common. Ask your health care provider which countries are considered high risk.  Your parents were born in a high-risk country, and you have not been immunized against hepatitis B (hepatitis B vaccine).  You have HIV or AIDS.  You use needles to inject street drugs.  You live with someone who has hepatitis B.  You have had sex with someone who has hepatitis B.  You get hemodialysis treatment.  You take certain medicines for conditions, including cancer, organ transplantation, and autoimmune conditions. Hepatitis C  Blood testing is recommended for:  Everyone born from [birth year] through 1966.  Anyone with known risk factors for hepatitis C. Sexually transmitted infections (STIs)  You should be screened for sexually transmitted infections (STIs) including gonorrhea and chlamydia if:  You are sexually active and are younger  than 75 years of age.  You are older than 75 years of age and your health care provider tells you that you are at risk for this type of infection.  Your sexual activity has changed since you were last screened and you are at an increased risk for chlamydia or gonorrhea. Ask your health care provider if you are at risk.  If you do not have HIV, but are at risk, it may be recommended that you take a prescription medicine daily to prevent HIV infection. This is called pre-exposure prophylaxis (PrEP). You are considered at risk if:  You are sexually active and do not regularly use condoms or know the HIV status of your partner(s).  You take drugs by  injection.  You are sexually active with a partner who has HIV. Talk with your health care provider about whether you are at high risk of being infected with HIV. If you choose to begin PrEP, you should first be tested for HIV. You should then be tested every 3 months for as long as you are taking PrEP.  PREGNANCY   If you are premenopausal and you may become pregnant, ask your health care provider about preconception counseling.  If you may become pregnant, take 400 to 800 micrograms (mcg) of folic acid every day.  If you want to prevent pregnancy, talk to your health care provider about birth control (contraception). OSTEOPOROSIS AND MENOPAUSE   Osteoporosis is a disease in which the bones lose minerals and strength with aging. This can result in serious bone fractures. Your risk for osteoporosis can be identified using a bone density scan.  If you are 3 years of age or older, or if you are at risk for osteoporosis and fractures, ask your health care provider if you should be screened.  Ask your health care provider whether you should take a calcium or vitamin D supplement to lower your risk for osteoporosis.  Menopause may have certain physical symptoms and risks.  Hormone replacement therapy may reduce some of these symptoms and risks. Talk to your health care provider about whether hormone replacement therapy is right for you.  HOME CARE INSTRUCTIONS   Schedule regular health, dental, and eye exams.  Stay current with your immunizations.   Do not use any tobacco products including cigarettes, chewing tobacco, or electronic cigarettes.  If you are pregnant, do not drink alcohol.  If you are breastfeeding, limit how much and how often you drink alcohol.  Limit alcohol intake to no more than 1 drink per day for nonpregnant women. One drink equals 12 ounces of beer, 5 ounces of wine, or 1 ounces of hard liquor.  Do not use street drugs.  Do not share needles.  Ask your  health care provider for help if you need support or information about quitting drugs.  Tell your health care provider if you often feel depressed.  Tell your health care provider if you have ever been abused or do not feel safe at home.   This information is not intended to replace advice given to you by your health care provider. Make sure you discuss any questions you have with your health care provider.   Document Released: 05/09/2011 Document Revised: 11/14/2014 Document Reviewed: 09/25/2013 Elsevier Interactive Patient Education 2016 [place] K. Panosh M.D.

## 2015-10-06 NOTE — Assessment & Plan Note (Signed)
BP   123 at home this am .     WC  effect monitoring.  No problem

## 2015-10-07 ENCOUNTER — Encounter: Payer: Self-pay | Admitting: Internal Medicine

## 2015-10-07 MED ORDER — CARVEDILOL 12.5 MG PO TABS: 12.5000 mg | ORAL_TABLET | Freq: Two times a day (BID) | ORAL | Status: DC

## 2015-10-10 ENCOUNTER — Encounter: Payer: Self-pay | Admitting: Internal Medicine

## 2016-01-26 ENCOUNTER — Other Ambulatory Visit: Payer: Self-pay | Admitting: Family Medicine

## 2016-01-26 DIAGNOSIS — E119 Type 2 diabetes mellitus without complications: Secondary | ICD-10-CM

## 2016-01-26 DIAGNOSIS — I10 Essential (primary) hypertension: Secondary | ICD-10-CM

## 2016-01-26 DIAGNOSIS — E785 Hyperlipidemia, unspecified: Secondary | ICD-10-CM

## 2016-01-27 ENCOUNTER — Other Ambulatory Visit (INDEPENDENT_AMBULATORY_CARE_PROVIDER_SITE_OTHER): Payer: Medicare Other

## 2016-01-27 DIAGNOSIS — E119 Type 2 diabetes mellitus without complications: Secondary | ICD-10-CM

## 2016-01-27 DIAGNOSIS — I10 Essential (primary) hypertension: Secondary | ICD-10-CM | POA: Diagnosis not present

## 2016-01-27 DIAGNOSIS — E785 Hyperlipidemia, unspecified: Secondary | ICD-10-CM | POA: Diagnosis not present

## 2016-01-27 LAB — BASIC METABOLIC PANEL
BUN: 12 mg/dL (ref 6–23)
CALCIUM: 9.2 mg/dL (ref 8.4–10.5)
CHLORIDE: 100 meq/L (ref 96–112)
CO2: 31 meq/L (ref 19–32)
Creatinine, Ser: 0.91 mg/dL (ref 0.40–1.20)
GFR: 63.92 mL/min (ref >60.00)
Glucose, Bld: 139 mg/dL — ABNORMAL HIGH (ref 70–99)
Potassium: 3.8 mEq/L (ref 3.5–5.1)
Sodium: 138 mEq/L (ref 135–145)

## 2016-01-27 LAB — LIPID PANEL
CHOL/HDL RATIO: 6
Cholesterol: 222 mg/dL — ABNORMAL HIGH (ref 0–200)
HDL: 34.8 mg/dL — ABNORMAL LOW (ref >39.00)
NONHDL: 187.25
Triglycerides: 208 mg/dL — ABNORMAL HIGH (ref 0.0–149.0)
VLDL: 41.6 mg/dL — AB (ref 0.0–40.0)

## 2016-01-27 LAB — LDL CHOLESTEROL, DIRECT: LDL DIRECT: 136 mg/dL

## 2016-01-27 LAB — HEMOGLOBIN A1C: Hgb A1c MFr Bld: 7.4 % — ABNORMAL HIGH (ref 4.6–6.5)

## 2016-02-02 NOTE — Progress Notes (Signed)
Chief Complaint  Patient presents with  . Follow-up    bp ok at home this am   . Diabetes  . Hyperlipidemia    HPI: Sharon Wilson 75 y.o.  Comes in for Chronic disease management  Dm tried inc dose of metformin and got diarrhea  So back down to 3 per day . LIPIDS trial zetia  Was 800  For 90 days  Generic  Her coverage is 40%  [facility] plan  Cannot take statins  ROS: See pertinent positives and negatives per HPI.  Past Medical History  Diagnosis Date  . Diabetes mellitus   . White coat hypertension   . OA (osteoarthritis)   . Diverticulosis   . Hyperlipidemia   . Anemia     hysterectomy    Family History  Problem Relation Age of Onset  . Cancer Mother     breast    Social History   Social History  . Marital Status: Married    Spouse Name: N/A  . Number of Children: N/A  . Years of Education: N/A   Social History Main Topics  . Smoking status: Never Smoker   . Smokeless tobacco: Not on file  . Alcohol Use: Not on file  . Drug Use: Not on file  . Sexual Activity: Not on file   Other Topics Concern  . Not on file   Social History Narrative   Married husband no retired   hh of 2   No pets    Outpatient Prescriptions Prior to Visit  Medication Sig Dispense Refill  . aspirin 81 MG tablet Take 81 mg by mouth daily.    . Calcium Carbonate-Vitamin D (OSCAL 500/200 D-3 PO) Take by mouth.    . carvedilol (COREG) 12.5 MG tablet Take 1 tablet (12.5 mg total) by mouth 2 (two) times daily with a meal. 180 tablet 3  . metFORMIN (GLUCOPHAGE) 500 MG tablet Take 2 tablet in the morning and 2 in the evening (Patient taking differently: 500 mg. Take 1 tablet in the morning and 2 in the evening) 360 tablet 3  . ezetimibe (ZETIA) 10 MG tablet Take 1 tablet (10 mg total) by mouth daily. (Patient not taking: Reported on 02/03/2016) 90 tablet 3   No facility-administered medications prior to visit.     EXAM:  BP 198/80 mmHg  Temp(Src) 98.1 F (36.7 C) (Oral)  Wt  131 lb 11.2 oz (59.739 kg)  Body mass index is 24.48 kg/(m^2).  GENERAL: vitals reviewed and listed above, alert, oriented, appears well hydrated and in no acute distress HEENT: atraumatic, conjunctiva  clear, no obvious abnormalities on inspection of external nose and ears PSYCH: pleasant and cooperative, no obvious depression or anxiety Lab Results  Component Value Date   WBC 5.9 09/29/2015   HGB 13.0 09/29/2015   HCT 39.1 09/29/2015   PLT 232.0 09/29/2015   GLUCOSE 139* 01/27/2016   CHOL 222* 01/27/2016   TRIG 208.0* 01/27/2016   HDL 34.80* 01/27/2016   LDLDIRECT 136.0 01/27/2016   LDLCALC 81 07/05/2013   ALT 10 09/29/2015   AST 15 09/29/2015   NA 138 01/27/2016   K 3.8 01/27/2016   CL 100 01/27/2016   CREATININE 0.91 01/27/2016   BUN 12 01/27/2016   CO2 31 01/27/2016   TSH 4.13 09/29/2015   HGBA1C 7.4* 01/27/2016   MICROALBUR 2.7* 09/29/2015    ASSESSMENT AND PLAN:  Discussed the following assessment and plan:  HLD (hyperlipidemia) - no change  Diabetes mellitus without complication (  [place]) - no change  Medication side effect, sequela - high dose metformin ok to go back down to 1500mg  per day   HTN, white coat  Statin intolerance Basically no change in    Numbers  Continue lifestyle intervention healthy eating and exercise . unbelievable cost  And  This  Will limit her choices and  Ability to get to goal  .   Disc n  About med groups   Will try welchol at 4 per day   consider lipid clinic also  -Patient advised to return or notify health care team  if symptoms worsen ,persist or new concerns arise.  Patient Instructions  Try  New med  2 twice a day to  If affordable and tolerated   2 twice a day and may increase to 2.. 3 x per day  .  consideration of seeing  Lipid in lipid clinic . If you cannot afford or tolerated thei medicine contact us   at that time.   Continue lifestyle intervention healthy eating and exercise . [email or web address]  Patient information may  have a list of drug classes to help with diabetes.             Standley Brooking. Bear Osten M.D.

## 2016-02-03 ENCOUNTER — Ambulatory Visit (INDEPENDENT_AMBULATORY_CARE_PROVIDER_SITE_OTHER): Payer: Medicare Other | Admitting: Internal Medicine

## 2016-02-03 ENCOUNTER — Encounter: Payer: Self-pay | Admitting: Internal Medicine

## 2016-02-03 VITALS — BP 198/80 | Temp 98.1°F | Wt 131.7 lb

## 2016-02-03 DIAGNOSIS — E785 Hyperlipidemia, unspecified: Secondary | ICD-10-CM | POA: Diagnosis not present

## 2016-02-03 DIAGNOSIS — E119 Type 2 diabetes mellitus without complications: Secondary | ICD-10-CM | POA: Diagnosis not present

## 2016-02-03 DIAGNOSIS — Z789 Other specified health status: Secondary | ICD-10-CM

## 2016-02-03 DIAGNOSIS — R030 Elevated blood-pressure reading, without diagnosis of hypertension: Secondary | ICD-10-CM | POA: Diagnosis not present

## 2016-02-03 DIAGNOSIS — T887XXS Unspecified adverse effect of drug or medicament, sequela: Secondary | ICD-10-CM

## 2016-02-03 DIAGNOSIS — T50905S Adverse effect of unspecified drugs, medicaments and biological substances, sequela: Secondary | ICD-10-CM

## 2016-02-03 DIAGNOSIS — Z889 Allergy status to unspecified drugs, medicaments and biological substances status: Secondary | ICD-10-CM

## 2016-02-03 DIAGNOSIS — IMO0001 Reserved for inherently not codable concepts without codable children: Secondary | ICD-10-CM

## 2016-02-03 MED ORDER — COLESEVELAM HCL 625 MG PO TABS: 1250.0000 mg | ORAL_TABLET | Freq: Two times a day (BID) | ORAL | Status: DC

## 2016-02-03 NOTE — Progress Notes (Signed)
Pre visit review using our clinic review tool, if applicable. No additional management support is needed unless otherwise documented below in the visit note. 

## 2016-02-03 NOTE — Patient Instructions (Signed)
Try  New med  2 twice a day to  If affordable and tolerated   2 twice a day and may increase to 2.. 3 x per day  .  consideration of seeing  Lipid in lipid clinic . If you cannot afford or tolerated thei medicine contact us   at that time.   Continue lifestyle intervention healthy eating and exercise . [email or web address]  Patient information may have a list of drug classes to help with diabetes.

## 2016-03-01 ENCOUNTER — Encounter: Payer: Self-pay | Admitting: Internal Medicine

## 2016-03-04 NOTE — Telephone Encounter (Signed)
That medication  Is it welchol?  For cholesterol and  Diabetes. Please confirm    get a list of meds for cholesterol and diabetes   From your insurance coverage   To see if there are other options we can try that are affordable   Consideration of going or refer to lipid clinic to see if they can help.  May we refer ?

## 2016-03-20 ENCOUNTER — Other Ambulatory Visit: Payer: Self-pay | Admitting: Internal Medicine

## 2016-03-22 NOTE — Telephone Encounter (Signed)
Sent to the pharmacy by e-scribe.  Pt has upcoming appt on 05/11/16

## 2016-04-27 LAB — HM DIABETES EYE EXAM

## 2016-05-02 ENCOUNTER — Encounter: Payer: Self-pay | Admitting: Family Medicine

## 2016-05-05 ENCOUNTER — Other Ambulatory Visit (INDEPENDENT_AMBULATORY_CARE_PROVIDER_SITE_OTHER): Payer: Medicare Other

## 2016-05-05 DIAGNOSIS — E119 Type 2 diabetes mellitus without complications: Secondary | ICD-10-CM | POA: Diagnosis not present

## 2016-05-05 DIAGNOSIS — E785 Hyperlipidemia, unspecified: Secondary | ICD-10-CM | POA: Diagnosis not present

## 2016-05-05 LAB — HEMOGLOBIN A1C: Hgb A1c MFr Bld: 7.1 % — ABNORMAL HIGH (ref 4.6–6.5)

## 2016-05-05 LAB — LIPID PANEL
Cholesterol: 210 mg/dL — ABNORMAL HIGH (ref 0–200)
HDL: 34.9 mg/dL — AB (ref >39.00)
NONHDL: 175.16
TRIGLYCERIDES: 234 mg/dL — AB (ref 0.0–149.0)
Total CHOL/HDL Ratio: 6
VLDL: 46.8 mg/dL — ABNORMAL HIGH (ref 0.0–40.0)

## 2016-05-05 LAB — LDL CHOLESTEROL, DIRECT: Direct LDL: 136 mg/dL

## 2016-05-10 NOTE — Progress Notes (Signed)
Pre visit review using our clinic review tool, if applicable. No additional management support is needed unless otherwise documented below in the visit note.  Chief Complaint  Patient presents with  . Follow-up    HPI: PRINCIE FASH 75 y.o. comes in for follow-up of her diabetes hyperlipidemia. Bp etc    welchol   Is too  Expensive . Marland Kitchen  Ok otherwise.   tbe active   BP at home until this week  And stress .   130... Family issues   Walking is an issues   110 and 120.   bg readings    BG  No lows   Doesn't have medicare part D dropped his cause of cost in the past but trying to get baack on this    ROS: See pertinent positives and negatives per HPI. No cp sob   Past Medical History  Diagnosis Date  . Diabetes mellitus   . White coat hypertension   . OA (osteoarthritis)   . Diverticulosis   . Hyperlipidemia   . Anemia     hysterectomy    Family History  Problem Relation Age of Onset  . Cancer Mother     breast    Social History   Social History  . Marital Status: Married    Spouse Name: N/A  . Number of Children: N/A  . Years of Education: N/A   Social History Main Topics  . Smoking status: Never Smoker   . Smokeless tobacco: Never Used  . Alcohol Use: No  . Drug Use: No  . Sexual Activity: Not Asked   Other Topics Concern  . None   Social History Narrative   Married husband no retired   hh of 2   No pets    Outpatient Prescriptions Prior to Visit  Medication Sig Dispense Refill  . aspirin 81 MG tablet Take 81 mg by mouth daily.    . Calcium Carbonate-Vitamin D (OSCAL 500/200 D-3 PO) Take by mouth.    . carvedilol (COREG) 12.5 MG tablet Take 1 tablet (12.5 mg total) by mouth 2 (two) times daily with a meal. 180 tablet 3  . metFORMIN (GLUCOPHAGE) 500 MG tablet TAKE ONE TABLET BY MOUTH IN THE MORNING AND  TWO  TABLETS IN THE EVENING 360 tablet 0  . colesevelam (WELCHOL) 625 MG tablet Take 2 tablets (1,250 mg total) by mouth 2 (two) times daily  with a meal. 120 tablet 3  . metFORMIN (GLUCOPHAGE) 500 MG tablet Take 2 tablet in the morning and 2 in the evening (Patient taking differently: 500 mg. Take 1 tablet in the morning and 2 in the evening) 360 tablet 3   No facility-administered medications prior to visit.     EXAM:  BP 174/80 mmHg  Temp(Src) 98.2 F (36.8 C) (Oral)  Ht 5' 1.5" (1.562 m)  Wt 134 lb (60.782 kg)  BMI 24.91 kg/m2  Body mass index is 24.91 kg/(m^2).  GENERAL: vitals reviewed and listed above, alert, oriented, appears well hydrated and in no acute distress HEENT: atraumatic, conjunctiva  clear, no obvious abnormalities on inspection of external nose and ears MS: moves all extremities without noticeable focal  abnormality PSYCH: pleasant and cooperative, no obvious depression or anxiety Lab Results  Component Value Date   WBC 5.9 09/29/2015   HGB 13.0 09/29/2015   HCT 39.1 09/29/2015   PLT 232.0 09/29/2015   GLUCOSE 139* 01/27/2016   CHOL 210* 05/05/2016   TRIG 234.0* 05/05/2016   HDL 34.90* 05/05/2016  LDLDIRECT 136.0 05/05/2016   LDLCALC 81 07/05/2013   ALT 10 09/29/2015   AST 15 09/29/2015   NA 138 01/27/2016   K 3.8 01/27/2016   CL 100 01/27/2016   CREATININE 0.91 01/27/2016   BUN 12 01/27/2016   CO2 31 01/27/2016   TSH 4.13 09/29/2015   HGBA1C 7.1* 05/05/2016   MICROALBUR 2.7* 09/29/2015   BP Readings from Last 3 Encounters:  05/11/16 174/80  02/03/16 198/80  10/06/15 120/80   reviewed labs  ASSESSMENT AND PLAN:  Discussed the following assessment and plan:  HLD (hyperlipidemia)  Statin intolerance  HTN, white coat  Essential hypertension  Insurance coverage problems - doesnt have Part D  Review cant take lova  ? prava ? Se  Total visit 61mins > 50% spent counseling and coordinating care as indicated in above note and in instructions to patient .  ised to return or notify health care team  if symptoms worsen ,persist or new concerns arise.  Patient Instructions    Add  Low dose  hctz   To the bp medication if goes too low and  Dizzy .  100 and below  Then stop it and let us know.   a1c is better .  mcontinue as you are doing.   Let us know if you get a medication plan  And reassess medication  Exam and med evaluation in novemeber or earlier if needed.  Can use Wynetta Fines     Friday wellness if wish.          Standley Brooking. Panosh M.D

## 2016-05-11 ENCOUNTER — Encounter: Payer: Self-pay | Admitting: Internal Medicine

## 2016-05-11 ENCOUNTER — Ambulatory Visit (INDEPENDENT_AMBULATORY_CARE_PROVIDER_SITE_OTHER): Payer: Medicare Other | Admitting: Internal Medicine

## 2016-05-11 VITALS — BP 174/80 | Temp 98.2°F | Ht 61.5 in | Wt 134.0 lb

## 2016-05-11 DIAGNOSIS — E785 Hyperlipidemia, unspecified: Secondary | ICD-10-CM | POA: Diagnosis not present

## 2016-05-11 DIAGNOSIS — I10 Essential (primary) hypertension: Secondary | ICD-10-CM | POA: Diagnosis not present

## 2016-05-11 DIAGNOSIS — Z789 Other specified health status: Secondary | ICD-10-CM

## 2016-05-11 DIAGNOSIS — Z889 Allergy status to unspecified drugs, medicaments and biological substances status: Secondary | ICD-10-CM | POA: Diagnosis not present

## 2016-05-11 DIAGNOSIS — R030 Elevated blood-pressure reading, without diagnosis of hypertension: Secondary | ICD-10-CM

## 2016-05-11 DIAGNOSIS — Z598 Other problems related to housing and economic circumstances: Secondary | ICD-10-CM

## 2016-05-11 DIAGNOSIS — IMO0001 Reserved for inherently not codable concepts without codable children: Secondary | ICD-10-CM

## 2016-05-11 DIAGNOSIS — Z5989 Other problems related to housing and economic circumstances: Secondary | ICD-10-CM

## 2016-05-11 MED ORDER — TRIAMTERENE-HCTZ 37.5-25 MG PO TABS: 0.5000 | ORAL_TABLET | Freq: Every day | ORAL | Status: DC

## 2016-05-11 NOTE — Assessment & Plan Note (Signed)
Her blood pressure is running a bit higher possibly from stress from home unclear significance add low-dose diuretic half a Maxzide a day and if she gets dizzy lightheaded with low blood pressure she should stop it. She has white coat effect but I have some concern that her baseline might be rising. She cannot take ACEs and would not test arb cause of  her reaction to the ACE inhibitor.

## 2016-05-11 NOTE — Assessment & Plan Note (Signed)
Reported other side effects of statins from the past. These are not in the electronic record. She doesn't have Medicare part D which limits possibilities. If she gets medication benefits we'll reassess. She wants to continue lifestyle intervention excess walking.

## 2016-05-11 NOTE — Patient Instructions (Addendum)
  Add  Low dose  hctz   To the bp medication if goes too low and  Dizzy .  100 and below  Then stop it and let us know.   a1c is better .  mcontinue as you are doing.   Let us know if you get a medication plan  And reassess medication  Exam and med evaluation in novemeber or earlier if needed.  Can use Wynetta Fines     Friday wellness if wish.

## 2016-05-30 ENCOUNTER — Ambulatory Visit (INDEPENDENT_AMBULATORY_CARE_PROVIDER_SITE_OTHER): Payer: Medicare Other | Admitting: Internal Medicine

## 2016-05-30 ENCOUNTER — Encounter: Payer: Self-pay | Admitting: Internal Medicine

## 2016-05-30 VITALS — BP 218/102 | HR 73 | Temp 98.5°F | Ht 61.5 in | Wt 131.6 lb

## 2016-05-30 DIAGNOSIS — R030 Elevated blood-pressure reading, without diagnosis of hypertension: Secondary | ICD-10-CM | POA: Diagnosis not present

## 2016-05-30 DIAGNOSIS — J029 Acute pharyngitis, unspecified: Secondary | ICD-10-CM

## 2016-05-30 DIAGNOSIS — T887XXS Unspecified adverse effect of drug or medicament, sequela: Secondary | ICD-10-CM

## 2016-05-30 DIAGNOSIS — IMO0001 Reserved for inherently not codable concepts without codable children: Secondary | ICD-10-CM

## 2016-05-30 DIAGNOSIS — T50905S Adverse effect of unspecified drugs, medicaments and biological substances, sequela: Secondary | ICD-10-CM

## 2016-05-30 LAB — POCT RAPID STREP A (OFFICE): Rapid Strep A Screen: NEGATIVE

## 2016-05-30 MED ORDER — AMOXICILLIN 500 MG PO CAPS: 500.0000 mg | ORAL_CAPSULE | Freq: Two times a day (BID) | ORAL | 0 refills | Status: DC

## 2016-05-30 NOTE — Patient Instructions (Addendum)
Uncertain if the  Initial sx are from the  Side effects of medicine .Marland Kitchen Diarrhea and itching . But could be from early illness . The sore throat  Acts like infection ears are normal .  Consider rx for bacterial  Throat infection .   After better  Then consider retry and if rash  Or problem we should stop medication.   Expect improvement by the end of the week .  Your lung exam is normal .  Contact us if not getting better either way and  Send in readings after illness and  may consider  Trying back

## 2016-05-30 NOTE — Progress Notes (Signed)
Pre visit review using our clinic review tool, if applicable. No additional management support is needed unless otherwise documented below in the visit note.  Chief Complaint  Patient presents with  . Sore Throat    Sx started after starting triamterene/hctz.  Diarrhea and itching have resolved.  . Ear Pain  . Adenopathy  . Fatigue  . Generalized Body Aches  . Cough    HPI: Sharon Wilson 75 y.o.  Comes for sda    after pills    Itching  And   Diarrhea  And stopped  maxide about 10 days ago  But has progressed  To    Throat a" swollen "  And hurts to swallow   All night long and and into ears.  No strep. ? Ulcer on tongue way back in mouth  No vomiting and no rash  Self rx / gargles   ROS: See pertinent positives and negatives per HPI. irritative cough no swelling  No sob no wheezing  No hemoptysis no chills    Achy and joints fur more than usual   Itching now goen  without   A rash  No fever  .  No one with  Strep at home    Past Medical History:  Diagnosis Date  . Anemia    hysterectomy  . Diabetes mellitus   . Diverticulosis   . Hyperlipidemia   . OA (osteoarthritis)   . White coat hypertension     Family History  Problem Relation Age of Onset  . Cancer Mother     breast    Social History   Social History  . Marital status: Married    Spouse name: N/A  . Number of children: N/A  . Years of education: N/A   Social History Main Topics  . Smoking status: Never Smoker  . Smokeless tobacco: Never Used  . Alcohol use No  . Drug use: No  . Sexual activity: Not Asked   Other Topics Concern  . None   Social History Narrative   Married husband no retired   hh of 2   No pets    Outpatient Medications Prior to Visit  Medication Sig Dispense Refill  . aspirin 81 MG tablet Take 81 mg by mouth daily.    . Calcium Carbonate-Vitamin D (OSCAL 500/200 D-3 PO) Take by mouth.    . carvedilol (COREG) 12.5 MG tablet Take 1 tablet (12.5 mg total) by mouth 2 (two)  times daily with a meal. 180 tablet 3  . metFORMIN (GLUCOPHAGE) 500 MG tablet TAKE ONE TABLET BY MOUTH IN THE MORNING AND  TWO  TABLETS IN THE EVENING 360 tablet 0  . triamterene-hydrochlorothiazide (MAXZIDE-25) 37.5-25 MG tablet Take 0.5 tablets by mouth daily. (Patient not taking: Reported on 05/30/2016) 90 tablet 1   No facility-administered medications prior to visit.      EXAM:  BP (!) 218/102 (BP Location: Right Arm, Patient Position: Sitting, Cuff Size: Normal)   Pulse 73   Temp 98.5 F (36.9 C) (Oral)   Ht 5' 1.5" (1.562 m)   Wt 131 lb 9.6 oz (59.7 kg)   SpO2 98%   BMI 24.46 kg/m   Body mass index is 24.46 kg/m.  GENERAL: vitals reviewed and listed above, alert, oriented, appears well hydrated and in no acute distress HEENT: atraumatic, conjunctiva  clear, no obvious abnormalities on inspection of external nose and ears tms clear  Nares min to no congestion and face non tender  OP : no  lesion edema  2 + red no lesion   NECK: no obvious masses on inspection  Tender  Ad nodes r more than left  Supple neck  LUNGS: clear to auscultation bilaterally, no wheezes, rales or rhonchi, good air movement CV: HRRR, no clubbing cyanosis or  peripheral edema nl cap refill  MS: moves all extremities without noticeable focal  Abnormality mild arthritis  Skin no acute rashes  PSYCH: pleasant and cooperative, no obvious depression or anxiety  ASSESSMENT AND PLAN:  Discussed the following assessment and plan:  Acute pharyngitis, unspecified etiology - Plan: POCT rapid strep A, Culture, Group A Strep  HTN, white coat - pt state bpt at home was 120 130    Medication side effect, sequela ? - uncertain if so  may rechallenge  if needed   Uncertain if had se of med or not.  For now stay off  And can try again after better from illness if sx return then stop and plan fu  Consider ht clinic .   -Patient advised to return or notify health care team  if symptoms worsen ,persist or new concerns  arise.  Patient Instructions  Uncertain if the  Initial sx are from the  Side effects of medicine .Marland Kitchen Diarrhea and itching . But could be from early illness . The sore throat  Acts like infection ears are normal .  Consider rx for bacterial  Throat infection .   After better  Then consider retry and if rash  Or problem we should stop medication.   Expect improvement by the end of the week .  Your lung exam is normal .  Contact us if not getting better either way and  Send in readings after illness and  may consider  Trying back        Clifton K. Abbee Cremeens M.D.

## 2016-06-01 LAB — CULTURE, GROUP A STREP: Organism ID, Bacteria: NORMAL

## 2016-07-05 ENCOUNTER — Encounter: Payer: Self-pay | Admitting: Internal Medicine

## 2016-07-06 ENCOUNTER — Other Ambulatory Visit: Payer: Self-pay | Admitting: Internal Medicine

## 2016-07-08 NOTE — Telephone Encounter (Signed)
Sent to the pharmacy by e-scribe. 

## 2016-07-20 ENCOUNTER — Telehealth: Payer: Self-pay | Admitting: Internal Medicine

## 2016-07-20 ENCOUNTER — Encounter: Payer: Self-pay | Admitting: Internal Medicine

## 2016-07-20 ENCOUNTER — Encounter (HOSPITAL_COMMUNITY): Payer: Self-pay

## 2016-07-20 ENCOUNTER — Observation Stay (HOSPITAL_COMMUNITY): Payer: Medicare Other

## 2016-07-20 ENCOUNTER — Inpatient Hospital Stay (HOSPITAL_COMMUNITY)
Admission: EM | Admit: 2016-07-20 | Discharge: 2016-07-22 | DRG: 066 | Disposition: A | Discharge status: Home / Self Care | Payer: Medicare Other | Attending: Internal Medicine | Admitting: Internal Medicine

## 2016-07-20 ENCOUNTER — Ambulatory Visit (INDEPENDENT_AMBULATORY_CARE_PROVIDER_SITE_OTHER): Payer: Medicare Other | Admitting: Internal Medicine

## 2016-07-20 ENCOUNTER — Emergency Department (HOSPITAL_COMMUNITY): Payer: Medicare Other

## 2016-07-20 VITALS — BP 180/80 | HR 70 | Temp 98.1°F | Ht 61.5 in | Wt 129.6 lb

## 2016-07-20 DIAGNOSIS — I10 Essential (primary) hypertension: Secondary | ICD-10-CM | POA: Diagnosis present

## 2016-07-20 DIAGNOSIS — R479 Unspecified speech disturbances: Secondary | ICD-10-CM

## 2016-07-20 DIAGNOSIS — R471 Dysarthria and anarthria: Secondary | ICD-10-CM | POA: Diagnosis present

## 2016-07-20 DIAGNOSIS — R4701 Aphasia: Secondary | ICD-10-CM | POA: Diagnosis present

## 2016-07-20 DIAGNOSIS — R29818 Other symptoms and signs involving the nervous system: Secondary | ICD-10-CM | POA: Diagnosis not present

## 2016-07-20 DIAGNOSIS — Z7982 Long term (current) use of aspirin: Secondary | ICD-10-CM

## 2016-07-20 DIAGNOSIS — Z889 Allergy status to unspecified drugs, medicaments and biological substances status: Secondary | ICD-10-CM

## 2016-07-20 DIAGNOSIS — E785 Hyperlipidemia, unspecified: Secondary | ICD-10-CM | POA: Diagnosis present

## 2016-07-20 DIAGNOSIS — I6522 Occlusion and stenosis of left carotid artery: Secondary | ICD-10-CM | POA: Diagnosis present

## 2016-07-20 DIAGNOSIS — R29810 Facial weakness: Secondary | ICD-10-CM | POA: Diagnosis present

## 2016-07-20 DIAGNOSIS — E119 Type 2 diabetes mellitus without complications: Secondary | ICD-10-CM

## 2016-07-20 DIAGNOSIS — R030 Elevated blood-pressure reading, without diagnosis of hypertension: Secondary | ICD-10-CM

## 2016-07-20 DIAGNOSIS — I639 Cerebral infarction, unspecified: Secondary | ICD-10-CM | POA: Diagnosis not present

## 2016-07-20 DIAGNOSIS — Z79899 Other long term (current) drug therapy: Secondary | ICD-10-CM

## 2016-07-20 DIAGNOSIS — Z888 Allergy status to other drugs, medicaments and biological substances status: Secondary | ICD-10-CM

## 2016-07-20 DIAGNOSIS — Z789 Other specified health status: Secondary | ICD-10-CM

## 2016-07-20 DIAGNOSIS — IMO0001 Reserved for inherently not codable concepts without codable children: Secondary | ICD-10-CM

## 2016-07-20 DIAGNOSIS — Z7984 Long term (current) use of oral hypoglycemic drugs: Secondary | ICD-10-CM

## 2016-07-20 LAB — COMPREHENSIVE METABOLIC PANEL
ALT: 11 U/L — AB (ref 14–54)
AST: 18 U/L (ref 15–41)
Albumin: 4.2 g/dL (ref 3.5–5.0)
Alkaline Phosphatase: 53 U/L (ref 38–126)
Anion gap: 8 (ref 5–15)
BILIRUBIN TOTAL: 0.8 mg/dL (ref 0.3–1.2)
BUN: 11 mg/dL (ref 6–20)
CALCIUM: 9.8 mg/dL (ref 8.9–10.3)
CO2: 27 mmol/L (ref 22–32)
Chloride: 102 mmol/L (ref 101–111)
Creatinine, Ser: 0.8 mg/dL (ref 0.44–1.00)
GLUCOSE: 146 mg/dL — AB (ref 65–99)
POTASSIUM: 4.3 mmol/L (ref 3.5–5.1)
Sodium: 137 mmol/L (ref 135–145)
Total Protein: 6.8 g/dL (ref 6.5–8.1)

## 2016-07-20 LAB — I-STAT CHEM 8, ED
BUN: 13 mg/dL (ref 6–20)
CALCIUM ION: 1.15 mmol/L (ref 1.15–1.40)
CHLORIDE: 99 mmol/L — AB (ref 101–111)
Creatinine, Ser: 0.9 mg/dL (ref 0.44–1.00)
Glucose, Bld: 141 mg/dL — ABNORMAL HIGH (ref 65–99)
HEMATOCRIT: 38 % (ref 36.0–46.0)
Hemoglobin: 12.9 g/dL (ref 12.0–15.0)
POTASSIUM: 4.2 mmol/L (ref 3.5–5.1)
SODIUM: 138 mmol/L (ref 135–145)
TCO2: 27 mmol/L (ref 0–100)

## 2016-07-20 LAB — DIFFERENTIAL
BASOS ABS: 0 10*3/uL (ref 0.0–0.1)
BASOS PCT: 0 %
EOS ABS: 0.3 10*3/uL (ref 0.0–0.7)
Eosinophils Relative: 6 %
LYMPHS ABS: 1 10*3/uL (ref 0.7–4.0)
Lymphocytes Relative: 17 %
MONO ABS: 0.5 10*3/uL (ref 0.1–1.0)
MONOS PCT: 8 %
Neutro Abs: 4.3 10*3/uL (ref 1.7–7.7)
Neutrophils Relative %: 69 %

## 2016-07-20 LAB — APTT: APTT: 29 s (ref 24–36)

## 2016-07-20 LAB — CBC
HEMATOCRIT: 38.4 % (ref 36.0–46.0)
HEMOGLOBIN: 12.7 g/dL (ref 12.0–15.0)
MCH: 29.7 pg (ref 26.0–34.0)
MCHC: 33.1 g/dL (ref 30.0–36.0)
MCV: 89.9 fL (ref 78.0–100.0)
Platelets: 217 10*3/uL (ref 150–400)
RBC: 4.27 MIL/uL (ref 3.87–5.11)
RDW: 12.5 % (ref 11.5–15.5)
WBC: 6.2 10*3/uL (ref 4.0–10.5)

## 2016-07-20 LAB — I-STAT TROPONIN, ED: Troponin i, poc: 0 ng/mL (ref 0.00–0.08)

## 2016-07-20 LAB — PROTIME-INR
INR: 0.93
Prothrombin Time: 12.4 seconds (ref 11.4–15.2)

## 2016-07-20 MED ORDER — INSULIN ASPART 100 UNIT/ML ~~LOC~~ SOLN: 0.0000 [IU] | Freq: Every day | SUBCUTANEOUS | Status: DC

## 2016-07-20 MED ORDER — SENNOSIDES-DOCUSATE SODIUM 8.6-50 MG PO TABS
1.0000 | ORAL_TABLET | Freq: Every evening | ORAL | Status: DC | PRN
  Filled 2016-07-20: qty 1

## 2016-07-20 MED ORDER — STROKE: EARLY STAGES OF RECOVERY BOOK
Freq: Once | Status: AC
  Administered 2016-07-21: 02:00:00
  Filled 2016-07-20: qty 1

## 2016-07-20 MED ORDER — SODIUM CHLORIDE 0.9 % IV SOLN
INTRAVENOUS | Status: AC
  Administered 2016-07-20: 22:00:00 via INTRAVENOUS

## 2016-07-20 MED ORDER — ACETAMINOPHEN 650 MG RE SUPP: 650.0000 mg | RECTAL | Status: DC | PRN

## 2016-07-20 MED ORDER — LABETALOL HCL 5 MG/ML IV SOLN
10.0000 mg | INTRAVENOUS | Status: DC | PRN
  Administered 2016-07-21 – 2016-07-22 (×4): 10 mg via INTRAVENOUS
  Filled 2016-07-20 (×4): qty 4

## 2016-07-20 MED ORDER — ACETAMINOPHEN 325 MG PO TABS: 650.0000 mg | ORAL_TABLET | ORAL | Status: DC | PRN

## 2016-07-20 MED ORDER — ASPIRIN 325 MG PO TABS
325.0000 mg | ORAL_TABLET | Freq: Every day | ORAL | Status: DC
  Administered 2016-07-20 – 2016-07-22 (×3): 325 mg via ORAL
  Filled 2016-07-20 (×3): qty 1

## 2016-07-20 MED ORDER — ENOXAPARIN SODIUM 40 MG/0.4ML ~~LOC~~ SOLN
40.0000 mg | SUBCUTANEOUS | Status: DC
  Administered 2016-07-21 – 2016-07-22 (×2): 40 mg via SUBCUTANEOUS
  Filled 2016-07-20 (×2): qty 0.4

## 2016-07-20 MED ORDER — INSULIN ASPART 100 UNIT/ML ~~LOC~~ SOLN
0.0000 [IU] | Freq: Three times a day (TID) | SUBCUTANEOUS | Status: DC
  Administered 2016-07-21: 1 [IU] via SUBCUTANEOUS
  Administered 2016-07-21: 3 [IU] via SUBCUTANEOUS
  Administered 2016-07-21: 1 [IU] via SUBCUTANEOUS
  Administered 2016-07-22 (×2): 2 [IU] via SUBCUTANEOUS

## 2016-07-20 MED ORDER — HYDRALAZINE HCL 20 MG/ML IJ SOLN
10.0000 mg | Freq: Once | INTRAMUSCULAR | Status: AC
  Administered 2016-07-20: 10 mg via INTRAVENOUS
  Filled 2016-07-20: qty 1

## 2016-07-20 NOTE — Progress Notes (Signed)
Pre visit review using our clinic review tool, if applicable. No additional management support is needed unless otherwise documented below in the visit note.  Chief Complaint  Patient presents with  . Speech Problem    Left hand weakness has now resolved.    HPI: Sharon Wilson 75 y.o.  Sent in to the office  by team health for "I think I could have had  a stroke".or mini stroke   Family  Concerned about change in speech continuing off and on last few days   bp has been up for weeks and really up 151 at home.  Stress recently.   Usually bp at home is 120 130 range and has wc ht.  Onset the morning of September 10 and she noticed with her left hand she was unable to floss her teeth in use fine motor manipulation. Was able to grab the symptoms lasted minutes over time and then improved. She did not have a headache but is noted her blood pressure was up at times. She did end up going bowling the next day and her colleagues noticed that her speech was off and she noticed it was a little hard to pronounce certain words. Her daughter was concerned also talking to her and told her to go to the doctor. She called the triage team and they made her an appointment in the office.    Speech is still off dysarthria  (  husband is hard of hearing) .    The left hand problem has not come back her balance is no different in her vision is no change. She has never had this before and states it feels weird. No new blood pressure medications she is on carvedilol twice a day had side effects with the diuretic. She does take an aspirin a day 81 mg did take it today. Denies palpitations and syncope.    See below note   message   07/20/16 9:31 AM  Everardo Beals routed this conversation to [facility] [address] Cyndi Lennert      07/20/16 9:30 AM  Note    Patient Name: Sharon Wilson DOB: July 09, 1941 Initial Comment Thinks she had a mini stroke, very early Sunday morning she lost control of  left hand when trying to brush teeth. Speech is slurred Nurse Assessment Nurse: Ronnald Ramp, RN, Miranda Date/Time (Eastern Time): 07/20/2016 9:25:38 AM Confirm and document reason for call. If symptomatic, describe symptoms. You must click the next button to save text entered. ---Caller states on Sunday she had trouble using her left hand for a short time, but that has resolved. She still feels like she is having trouble with speech. Has the patient traveled out of the country within the last 30 days? ---Not Applicable Does the patient have any new or worsening symptoms? ---Yes Will a triage be completed? ---Yes Related visit to physician within the last 2 weeks? ---No Does the PT have any chronic conditions? (i.e. diabetes, asthma, etc.) ---Yes List chronic conditions. ---Diabetes, HTN Is this a behavioral health or substance abuse call? ---No Guidelines Guideline Title Affirmed Question Affirmed Notes Neurologic Deficit [1] Loss of speech or garbled speech AND [2] gradual onset (e.g., days to weeks) AND [3] present now Final Disposition User See Physician within 4 Hours (or PCP triage) Ronnald Ramp, RN, Miranda Comments Appt scheduled with Dr. Regis Bill for 2PM today. Referrals REFERRED TO PCP OFFICE Disagree/Comply: Comply      ROS: See pertinent positives and negatives per HPI.No chest pain  shortness of breath rapid heart rate noted. No numbness at this time.  Past Medical History:  Diagnosis Date  . Anemia    hysterectomy  . Diabetes mellitus   . Diverticulosis   . Hyperlipidemia   . OA (osteoarthritis)   . White coat hypertension     Family History  Problem Relation Age of Onset  . Cancer Mother     breast    Social History   Social History  . Marital status: Married    Spouse name: N/A  . Number of children: N/A  . Years of education: N/A   Social History Main Topics  . Smoking status: Never Smoker  . Smokeless tobacco: Never Used  . Alcohol use No  . Drug  use: No  . Sexual activity: Not Asked   Other Topics Concern  . None   Social History Narrative   Married husband no retired   hh of 2   No pets    Outpatient Medications Prior to Visit  Medication Sig Dispense Refill  . aspirin 81 MG tablet Take 81 mg by mouth daily.    . Calcium Carbonate-Vitamin D (OSCAL 500/200 D-3 PO) Take by mouth.    . carvedilol (COREG) 12.5 MG tablet Take 1 tablet (12.5 mg total) by mouth 2 (two) times daily with a meal. 180 tablet 3  . metFORMIN (GLUCOPHAGE) 500 MG tablet TAKE ONE TABLET BY MOUTH IN THE MORNING AND TWO TABS IN THE EVENING 360 tablet 0  . amoxicillin (AMOXIL) 500 MG capsule Take 1 capsule (500 mg total) by mouth 2 (two) times daily. 20 capsule 0  . triamterene-hydrochlorothiazide (MAXZIDE-25) 37.5-25 MG tablet Take 0.5 tablets by mouth daily. (Patient not taking: Reported on 05/30/2016) 90 tablet 1   No facility-administered medications prior to visit.      EXAM:  BP (!) 180/80 (BP Location: Left Arm, Patient Position: Sitting)   Pulse 70   Temp 98.1 F (36.7 C) (Oral)   Ht 5' 1.5" (1.562 m)   Wt 129 lb 9.6 oz (58.8 kg)   SpO2 96%   BMI 24.09 kg/m   Body mass index is 24.09 kg/m.  GENERAL: vitals reviewed and listed above, alert, oriented, appears well hydrated and in no acute distressKnowing this patient her articulation is definitely abnormal for her. No drooling no fasciculation and face looks symmetrical. HEENT: atraumatic, conjunctiva  clear, no obvious abnormalities on inspection of external nose and ears OP : no lesion edema or exudate tongue appears to be midline. NECK: no obvious masses on inspection palpation  LUNGS: clear to auscultation bilaterally, no wheezes, rales or rhonchi,  CV: HRRR, no clubbing cyanosis or  peripheral edema nl cap refill  MS: moves all extremities without noticeable focal  abnormality motor strength seems normal upper extremities DTRs are present. PSYCH: pleasant and cooperative, cognition  appears to be intact NEURO: oriented x 3  No focal muscle weakness or atrophy. DTRs ?symmetrical. Gait WNL.  Grossly non focal. No tremor or abnormal movement.    Lab Results  Component Value Date   WBC 5.9 09/29/2015   HGB 13.0 09/29/2015   HCT 39.1 09/29/2015   PLT 232.0 09/29/2015   GLUCOSE 139 (H) 01/27/2016   CHOL 210 (H) 05/05/2016   TRIG 234.0 (H) 05/05/2016   HDL 34.90 (L) 05/05/2016   LDLDIRECT 136.0 05/05/2016   LDLCALC 81 07/05/2013   ALT 10 09/29/2015   AST 15 09/29/2015   NA 138 01/27/2016   K 3.8 01/27/2016   CL  100 01/27/2016   CREATININE 0.91 01/27/2016   BUN 12 01/27/2016   CO2 31 01/27/2016   TSH 4.13 09/29/2015   HGBA1C 7.1 (H) 05/05/2016   MICROALBUR 2.7 (H) 09/29/2015    ASSESSMENT AND PLAN:  Discussed the following assessment and plan:  Speech disturbance newonset   - presumed  neuro event   Neurological deficit, transient left hand  - see text   HTN, white coat  Diabetes mellitus without complication ([place])  Essential hypertension See above  At risk  Take asa After speaking with her while I am concerned that she had a TIA and perhaps something evolving. She definitely has residual speech changes for no other obvious cause. She has had hypertension for a while as white coat effect. Her monitor at home has been checked although she states her baseline is been off it home. She's had side effects from number of antihypertensives. She is diabetic in reasonable control. She's had side effects with ACE inhibitors and some  Statins. Discussed situation with patient and husband triage nurse in the emergency room to be contacted that patient  will be coming to [facility] ED for evaluation. expedient evaluation is indicated in her because she has high risk and not able to do this in the current Op setting .  -Patient advised to return or notify health care team  if symptoms worsen ,persist or new concerns arise.  Patient Instructions  I am concerned  about you  trying to have a full blown stroke and  Your with  your speech change  And  Short term left hand  Weakness    And we can not do this work up quickly and safely   In out patient . Need more evaluation  As we discussed    your speech is definitely   Different  Although  The res of your exam is reassuring .  Please proceed to the ED as planned  [facility] .        Standley Brooking. Panosh M.D.

## 2016-07-20 NOTE — Progress Notes (Signed)
Patient arrived to unit via ED staff, belongings at bedside. Vitals stable, tele applied. Patient oriented to unit.

## 2016-07-20 NOTE — ED Provider Notes (Signed)
[facility] DEPT Provider Note   CSN: [id number] Arrival date & time: 07/20/16  1449     History   Chief Complaint Chief Complaint  Patient presents with  . Aphasia  . Hypertension    HPI Sharon Wilson is a 75 y.o. female with pmhx significant for T2DM, HLD, and HTN presenting with slurred speech. She states that she started having tingling in her hand on Sunday night this lasted for a couple minutes and then resolved. The next day patient noticed that her speech was different sometimes slurred. Patient went to her primary care physician and they recommended her to come in here for evaluation. Denies any recent head trauma. No nausea/vomitting, fevers or chills.   HPI  Past Medical History:  Diagnosis Date  . Anemia    hysterectomy  . Diabetes mellitus   . Diverticulosis   . Hyperlipidemia   . OA (osteoarthritis)   . White coat hypertension     Patient Active Problem List   Diagnosis Date Noted  . Estrogen deficiency 08/06/2014  . Medicare annual wellness visit, initial 08/06/2014  . Statin intolerance 08/06/2014  . Diabetes mellitus type 2, uncontrolled, without complications ([place]) [id number]  . Chest discomfort 03/17/2014  . Body aches 03/17/2014  . Fatigue 03/17/2014  . Abnormal AST  03/17/2014  . HTN, white coat 12/13/2013  . Visit for preventive health examination 02/08/2013  . White coat hypertension  effect. 06/09/2012  . Allergic reaction caused by a drug 05/18/2012  . Allergy to angiotensin-converting enzyme (ACE) inhibitors 05/18/2012  . Stressful life event affecting family 02/29/2012  . ADVERSE REACTION TO MEDICATION 11/26/2010  . SKIN LESION, UNCERTAIN SIGNIFICANCE 10/25/2010  . SINUS TACHYCARDIA 08/01/2007  . HLD (hyperlipidemia) 05/01/2007  . DIVERTICULOSIS, COLON 05/01/2007  . COLONIC POLYPS, HX OF 05/01/2007  . DIABETES MELLITUS, TYPE II 03/14/2007  . Essential hypertension 03/14/2007  . OSTEOARTHRITIS 03/14/2007    Past Surgical  History:  Procedure Laterality Date  . ABDOMINAL HYSTERECTOMY     fibroid bleeding  . carotid dopplers  11/11   per [facility]  . CATARACT EXTRACTION  2011    OB History    Gravida Para Term Preterm AB Living   4 4           SAB TAB Ectopic Multiple Live Births                   Home Medications    Prior to Admission medications   Medication Sig Start Date End Date Taking? Authorizing Provider  aspirin 81 MG tablet Take 81 mg by mouth daily.    Historical Provider, MD  Calcium Carbonate-Vitamin D (OSCAL 500/200 D-3 PO) Take by mouth.    Historical Provider, MD  carvedilol (COREG) 12.5 MG tablet Take 1 tablet (12.5 mg total) by mouth 2 (two) times daily with a meal. 10/07/15   [name], MD  metFORMIN (GLUCOPHAGE) 500 MG tablet TAKE ONE TABLET BY MOUTH IN THE MORNING AND TWO TABS IN THE EVENING 07/08/16   [name], MD    Family History Family History  Problem Relation Age of Onset  . Cancer Mother     breast    Social History Social History  Substance Use Topics  . Smoking status: Never Smoker  . Smokeless tobacco: Never Used  . Alcohol use No     Allergies   Ace inhibitors; Lovastatin; and Norvasc [amlodipine besylate]   Review of Systems Review of Systems  Constitutional: Negative for chills and  fever.  HENT: Positive for voice change. Negative for congestion and trouble swallowing.   Eyes: Negative for visual disturbance.  Respiratory: Negative for cough and shortness of breath.   Cardiovascular: Negative for chest pain.  Gastrointestinal: Negative for abdominal pain, nausea and vomiting.  Musculoskeletal: Negative for neck pain and neck stiffness.  Neurological: Positive for numbness. Negative for dizziness, facial asymmetry, weakness and headaches.     Physical Exam Updated Vital Signs BP 176/68 (BP Location: Right Arm)   Pulse 69   Temp 98 F (36.7 C) (Oral)   Resp 16   Ht 5\' 2"  (1.575 m)   Wt 58.5 kg   SpO2 100%   BMI 23.59 kg/m    Physical Exam  Constitutional: She is oriented to person, place, and time. She appears well-developed and well-nourished.  HENT:  Head: Normocephalic and atraumatic.  Mouth/Throat: Oropharynx is clear and moist.  Eyes: Conjunctivae are normal.  Neck: Normal range of motion. Neck supple.  Cardiovascular: Normal rate, regular rhythm, normal heart sounds and intact distal pulses.   Pulmonary/Chest: Effort normal and breath sounds normal.  Abdominal: Soft. Bowel sounds are normal.  Musculoskeletal: Normal range of motion.  Neurological: She is alert and oriented to person, place, and time. She has normal reflexes. No cranial nerve deficit. She exhibits normal muscle tone.  Skin: Skin is warm and dry.     ED Treatments / Results  Labs (all labs ordered are listed, but only abnormal results are displayed) Labs Reviewed  COMPREHENSIVE METABOLIC PANEL - Abnormal; Notable for the following:       Result Value   Glucose, Bld 146 (*)    ALT 11 (*)    All other components within normal limits  I-STAT CHEM 8, ED - Abnormal; Notable for the following:    Chloride 99 (*)    Glucose, Bld 141 (*)    All other components within normal limits  PROTIME-INR  APTT  CBC  DIFFERENTIAL  I-STAT TROPOININ, ED  CBG MONITORING, ED    EKG  EKG Interpretation  Date/Time:  Wednesday July 20 2016 15:18:45 EDT Ventricular Rate:  78 PR Interval:  186 QRS Duration: 94 QT Interval:  400 QTC Calculation: 456 R Axis:   -36 Text Interpretation:  Normal sinus rhythm Left axis deviation Abnormal ECG Confirmed by HAVILAND MD, JULIE ([id number]) on 07/20/2016 6:14:19 PM       Radiology Ct Head Wo Contrast  Result Date: 07/20/2016 CLINICAL DATA:  Slurred speech.  No history of stroke. EXAM: CT HEAD WITHOUT CONTRAST TECHNIQUE: Contiguous axial images were obtained from the base of the skull through the vertex without intravenous contrast. COMPARISON:  None. FINDINGS: Brain: No evidence of acute cortical  infarction, hemorrhage, extra-axial collection, ventriculomegaly, or mass effect. Small area of low attenuation in the right frontoparietal region adjacent to the right lateral ventricle concerning for a lacunar infarct. Generalized cerebral atrophy. Periventricular white matter low attenuation likely secondary to microangiopathy. Vascular: Cerebrovascular atherosclerotic calcifications are noted. Skull: Negative for fracture or focal lesion. Sinuses/Orbits: Visualized portions of the orbits are unremarkable. Visualized portions of the paranasal sinuses and mastoid air cells are unremarkable. Other: None. IMPRESSION: 1. Small area of low attenuation in the right frontoparietal region adjacent to the right lateral ventricle concerning for an acute or subacute lacunar infarct. Electronically Signed   By: Kathreen Devoid   On: 07/20/2016 15:59    Procedures Procedures (including critical care time)  Medications Ordered in ED Medications - No data to display  Initial Impression / Assessment and Plan / ED Course  I have reviewed the triage vital signs and the nursing notes.  Pertinent labs & imaging results that were available during my care of the patient were reviewed by me and considered in my medical decision making (see chart for details).  Clinical Course   Presenting with 2 day history of slurred speech. CT positive for acute or subacute lacunar infarct. Otherwise all the labs are negative. Patient to be admitted for Stroke workup  Final Clinical Impressions(s) / ED Diagnoses   Final diagnoses:  None    New Prescriptions New Prescriptions   No medications on file     Stanislav Gervase Cletis Media, MD 07/20/16 2040    Isla Pence, MD 07/20/16 2116

## 2016-07-20 NOTE — H&P (Signed)
History and Physical    Sharon Wilson [id number] DOB: 23-May-1941 DOA: 07/20/2016  PCP: Lottie Dawson, MD   Patient coming from: Home, by way of PCP office   Chief Complaint: Dysarthria   HPI: Sharon Wilson is a 75 y.o. female with medical history significant for type 2 diabetes mellitus, hypertension, and hyperlipidemia who presents to the emergency department for evaluation of dysarthria and transient loss of coordination involving the left hand. The patient reports that she had been enjoying good health until early in the morning of 07/17/2016, when she was trying to floss her teeth, but was unable to due to a loss of coordination in her left hand. There was concomitant development of dysarthria, but there is been no headache, change in vision or hearing, focal numbness or weakness, difficulty swallowing, or cough. Patient denies any chest pain or palpitations associated with this, and has never experienced similar symptoms previously. She reports that the left hand symptoms resolved spontaneously over the course of a couple minutes but the dysarthria has persisted. She feels that sometimes her speech is more affected than others. She denies any difficulty with word finding, but reports problems with pronunciation, worse when she is speaking more rapidly. Patient saw her primary care physician for evaluation of these complaints earlier in the day, was suspected of having a stroke, and was directed to the emergency department for further evaluation.  ED Course: Upon arrival to the ED, patient is found to be afebrile, saturating well on room air, hypertensive to 220/90, and with vitals otherwise stable. EKG demonstrates normal sinus rhythm with left axis deviation, INR is within the normal limits, and troponin is undetectable. Basic lab work, including CMP and CBC with differential are essentially normal. Noncontrast head CT reveals a small area of low attenuation in the right  frontoparietal region concerning for acute or subacute lacunar infarct. Patient was treated with 325 mg aspirin and 10 mg IV hydralazine in the emergency department. Neurology has been consulted by the ED physician. Patient has remained stable in the ED and will be observed on the telemetry unit for ongoing evaluation and management of dysarthria suspected secondary to subacute ischemic CVA.  Review of Systems:  All other systems reviewed and apart from HPI, are negative.  Past Medical History:  Diagnosis Date  . Anemia    hysterectomy  . Diabetes mellitus   . Diverticulosis   . Hyperlipidemia   . OA (osteoarthritis)   . White coat hypertension     Past Surgical History:  Procedure Laterality Date  . ABDOMINAL HYSTERECTOMY     fibroid bleeding  . carotid dopplers  11/11   [facility]  . CATARACT EXTRACTION  2011     reports that she has never smoked. She has never used smokeless tobacco. She reports that she does not drink alcohol or use drugs.  Allergies  Allergen Reactions  . Ace Inhibitors Hives, Swelling and Cough  . Lovastatin Other (See Comments)    Leg cramps  Can take pravastatin  . Norvasc [Amlodipine Besylate]     Light headed and vertigo like  Also may have had hives.     Family History  Problem Relation Age of Onset  . Cancer Mother     breast     Prior to Admission medications   Medication Sig Start Date End Date Taking? Authorizing Provider  aspirin 81 MG tablet Take 81 mg by mouth daily.    Historical Provider, MD  Calcium Carbonate-Vitamin D (OSCAL  500/200 D-3 PO) Take by mouth.    Historical Provider, MD  carvedilol (COREG) 12.5 MG tablet Take 1 tablet (12.5 mg total) by mouth 2 (two) times daily with a meal. 10/07/15   [name], MD  metFORMIN (GLUCOPHAGE) 500 MG tablet TAKE ONE TABLET BY MOUTH IN THE MORNING AND TWO TABS IN THE EVENING 07/08/16   [name], MD    Physical Exam: Vitals:   07/20/16 1456 07/20/16 1457 07/20/16 1707  07/20/16 1851  BP: (!) 212/81  176/68 (!) 219/74  Pulse: 79  69 72  Resp: 16  16 13   Temp: 98 F (36.7 C)     TempSrc: Oral     SpO2: 98%  100% 99%  Weight:  58.5 kg (129 lb)    Height:  5\' 2"  (1.575 m)        Constitutional: NAD, calm, comfortable Eyes: PERTLA, lids and conjunctivae normal ENMT: Mucous membranes are moist. Posterior pharynx clear of any exudate or lesions.   Neck: normal, supple, no masses, no thyromegaly Respiratory: clear to auscultation bilaterally, no wheezing, no crackles. Normal respiratory effort.   Cardiovascular: S1 & S2 heard, regular rate and rhythm, no significant murmur. No extremity edema. 2+ pedal pulses. No carotid bruits. No significant JVD. Abdomen: No distension, no tenderness, no masses palpated. Bowel sounds normal.  Musculoskeletal: no clubbing / cyanosis. No joint deformity upper and lower extremities. Normal muscle tone.  Skin: no significant rashes, lesions, ulcers. Warm, dry, well-perfused. Neurologic: CN 2-12 grossly intact. Sensation intact, DTR normal. Strength 5/5 in all 4 limbs. Mild dysarthria. Psychiatric: Normal judgment and insight. Alert and oriented x 3. Normal mood and affect.     Labs on Admission: I have personally reviewed following labs and imaging studies  CBC:  Recent Labs Lab 07/20/16 1520 07/20/16 1531  WBC 6.2  --   NEUTROABS 4.3  --   HGB 12.7 12.9  HCT 38.4 38.0  MCV 89.9  --   PLT 217  --    Basic Metabolic Panel:  Recent Labs Lab 07/20/16 1520 07/20/16 1531  NA 137 138  K 4.3 4.2  CL 102 99*  CO2 27  --   GLUCOSE 146* 141*  BUN 11 13  CREATININE 0.80 0.90  CALCIUM 9.8  --    GFR: Estimated Creatinine Clearance: 42.1 mL/min (by C-G formula based on SCr of 0.9 mg/dL). Liver Function Tests:  Recent Labs Lab 07/20/16 1520  AST 18  ALT 11*  ALKPHOS 53  BILITOT 0.8  PROT 6.8  ALBUMIN 4.2   No results for input(s): LIPASE, AMYLASE in the last 168 hours. No results for input(s):  AMMONIA in the last 168 hours. Coagulation Profile:  Recent Labs Lab 07/20/16 1520  INR 0.93   Cardiac Enzymes: No results for input(s): CKTOTAL, CKMB, CKMBINDEX, TROPONINI in the last 168 hours. BNP (last 3 results) No results for input(s): PROBNP in the last 8760 hours. HbA1C: No results for input(s): HGBA1C in the last 72 hours. CBG: No results for input(s): GLUCAP in the last 168 hours. Lipid Profile: No results for input(s): CHOL, HDL, LDLCALC, TRIG, CHOLHDL, LDLDIRECT in the last 72 hours. Thyroid Function Tests: No results for input(s): TSH, T4TOTAL, FREET4, T3FREE, THYROIDAB in the last 72 hours. Anemia Panel: No results for input(s): VITAMINB12, FOLATE, FERRITIN, TIBC, IRON, RETICCTPCT in the last 72 hours. Urine analysis:    Component Value Date/Time   COLORURINE yellow 02/13/2009 0811   APPEARANCEUR Clear 02/13/2009 0811   LABSPEC 1.015  02/13/2009 0811   PHURINE 5.5 02/13/2009 0811   HGBUR negative 02/13/2009 0811   BILIRUBINUR negative 02/13/2009 0811   UROBILINOGEN 0.2 02/13/2009 0811   NITRITE negative 02/13/2009 0811   Sepsis Labs: @LABRCNTIP (procalcitonin:4,lacticidven:4) )No results found for this or any previous visit (from the past 240 hour(s)).   Radiological Exams on Admission: Ct Head Wo Contrast  Result Date: 07/20/2016 CLINICAL DATA:  Slurred speech.  No history of stroke. EXAM: CT HEAD WITHOUT CONTRAST TECHNIQUE: Contiguous axial images were obtained from the base of the skull through the vertex without intravenous contrast. COMPARISON:  None. FINDINGS: Brain: No evidence of acute cortical infarction, hemorrhage, extra-axial collection, ventriculomegaly, or mass effect. Small area of low attenuation in the right frontoparietal region adjacent to the right lateral ventricle concerning for a lacunar infarct. Generalized cerebral atrophy. Periventricular white matter low attenuation likely secondary to microangiopathy. Vascular: Cerebrovascular  atherosclerotic calcifications are noted. Skull: Negative for fracture or focal lesion. Sinuses/Orbits: Visualized portions of the orbits are unremarkable. Visualized portions of the paranasal sinuses and mastoid air cells are unremarkable. Other: None. IMPRESSION: 1. Small area of low attenuation in the right frontoparietal region adjacent to the right lateral ventricle concerning for an acute or subacute lacunar infarct. Electronically Signed   By: Kathreen Devoid   On: 07/20/2016 15:59    EKG: Independently reviewed. Sinus rhythm, left axis deviation  Assessment/Plan  1. Ischemic CVA  - Developed loss of coordination involving left hand the morning of 9/10, resolved over a cpl minutes - Dysarthria also developed early in the am of 9/10 and is persistent  - Non-contrast head CT with likely subacute lacunar infarct in right frontoparietal region  - tPA not given d/t pt being outside of the timeframe for consideration, sxs too mild  - ASA 325 mg given in ED, will continue as secondary ppx  - Admit to telemetry, watch for arrhythmia  - MRI/MRA brain, TTE, carotid dopplers ordered  - Fasting lipid panel and A1c ordered - Frequent neuro checks; NPO until swallow screen in passed - PT, OT, SLP evals requested  - Plan to begin lowering BP as event likely occurred >72 hrs ago now - Neurology is consulting and much appreciated, will follow-up recommendations and alter above plan as advised   2. Dysarthria  - Likely secondary to the CVA - SLP eval has been requested  - Pt appears to manage secretions well, swallow eval pending   3. Type II DM  - A1c 7.1% in June 2017  - Managed with metformin only at home, will hold this while in hospital  - Check CBG with meals and qHS - Start with a low-intensity sliding-scale correctional only and adjust prn    4. Hypertension  - Managed with low-dose Coreg at home; currently held as she is NPO  - Plan to treat prn with labetalol IVP's for now    5.  Hyperlipidemia  - Pt reports a statin-intolerance  - Lipid panel from June '17 with LDL 136 and HDL 35  - LDL goal will now be <70 or <100 depending on society guidelines; PCSK9 antibody, or ezetamibe could be considered  - Dietary and lifestyle measures have not yet been attempted     DVT prophylaxis: sq Lovenox  Code Status: Full  Family Communication: Husband updated at bedside  Disposition Plan: Observe on telemetry Consults called: Neurology Admission status: Observation    Vianne Bulls, MD [facility] Pager [phone]  If 7PM-7AM, please contact night-coverage [email or web address] Password [email or web address]  07/20/2016, 7:56 PM

## 2016-07-20 NOTE — ED Notes (Signed)
Pt given [place] sandwich and drink per MD

## 2016-07-20 NOTE — Telephone Encounter (Signed)
Noted  

## 2016-07-20 NOTE — Patient Instructions (Signed)
I am concerned  about you trying to have a full blown stroke and  Your with  your speech change  And  Short term left hand  Weakness    And we can not do this work up quickly and safely   In out patient . Need more evaluation  As we discussed    your speech is definitely   Different  Although  The res of your exam is reassuring .  Please proceed to the ED as planned  [facility] .

## 2016-07-20 NOTE — ED Notes (Addendum)
Pt to MRI at this time.  No change in neuro status.  Pt remains alert and oriented x's 3.  Skin warm and dry, color appropriate.  Speech clear.

## 2016-07-20 NOTE — Telephone Encounter (Signed)
Patient Name: Sharon Wilson DOB: [birth year] Initial Comment Thinks she had a mini stroke, very early Sunday morning she lost control of left hand when trying to brush teeth. Speech is slurred Nurse Assessment Nurse: Ronnald Ramp, RN, Miranda Date/Time (Eastern Time): 07/20/2016 9:25:38 AM Confirm and document reason for call. If symptomatic, describe symptoms. You must click the next button to save text entered. ---Caller states on Sunday she had trouble using her left hand for a short time, but that has resolved. She still feels like she is having trouble with speech. Has the patient traveled out of the country within the last 30 days? ---Not Applicable Does the patient have any new or worsening symptoms? ---Yes Will a triage be completed? ---Yes Related visit to physician within the last 2 weeks? ---No Does the PT have any chronic conditions? (i.e. diabetes, asthma, etc.) ---Yes List chronic conditions. ---Diabetes, HTN Is this a behavioral health or substance abuse call? ---No Guidelines Guideline Title Affirmed Question Affirmed Notes Neurologic Deficit [1] Loss of speech or garbled speech AND [2] gradual onset (e.g., days to weeks) AND [3] present now Final Disposition User See Physician within 4 Hours (or PCP triage) Ronnald Ramp, RN, Miranda Comments Appt scheduled with Dr. Regis Bill for 2PM today. Referrals REFERRED TO PCP OFFICE Disagree/Comply: Comply

## 2016-07-20 NOTE — ED Triage Notes (Signed)
Pt reports slurred speech X2 days. Pt denies other complaints. Pt reports she feels fine just has slurred speech. Pt a&o X4. Referred to ED by her PCP. Pt also reports hypertension and is hypertensive in triage.

## 2016-07-21 ENCOUNTER — Observation Stay (HOSPITAL_COMMUNITY): Payer: Medicare Other

## 2016-07-21 DIAGNOSIS — R4701 Aphasia: Secondary | ICD-10-CM | POA: Diagnosis present

## 2016-07-21 DIAGNOSIS — E785 Hyperlipidemia, unspecified: Secondary | ICD-10-CM | POA: Diagnosis present

## 2016-07-21 DIAGNOSIS — I10 Essential (primary) hypertension: Secondary | ICD-10-CM | POA: Diagnosis present

## 2016-07-21 DIAGNOSIS — Z7982 Long term (current) use of aspirin: Secondary | ICD-10-CM | POA: Diagnosis not present

## 2016-07-21 DIAGNOSIS — Z7984 Long term (current) use of oral hypoglycemic drugs: Secondary | ICD-10-CM | POA: Diagnosis not present

## 2016-07-21 DIAGNOSIS — R29810 Facial weakness: Secondary | ICD-10-CM | POA: Diagnosis present

## 2016-07-21 DIAGNOSIS — I639 Cerebral infarction, unspecified: Secondary | ICD-10-CM

## 2016-07-21 DIAGNOSIS — I6522 Occlusion and stenosis of left carotid artery: Secondary | ICD-10-CM | POA: Diagnosis present

## 2016-07-21 DIAGNOSIS — R471 Dysarthria and anarthria: Secondary | ICD-10-CM | POA: Diagnosis present

## 2016-07-21 DIAGNOSIS — I6789 Other cerebrovascular disease: Secondary | ICD-10-CM | POA: Diagnosis not present

## 2016-07-21 DIAGNOSIS — E119 Type 2 diabetes mellitus without complications: Secondary | ICD-10-CM | POA: Diagnosis present

## 2016-07-21 DIAGNOSIS — Z888 Allergy status to other drugs, medicaments and biological substances status: Secondary | ICD-10-CM | POA: Diagnosis not present

## 2016-07-21 DIAGNOSIS — Z79899 Other long term (current) drug therapy: Secondary | ICD-10-CM | POA: Diagnosis not present

## 2016-07-21 LAB — GLUCOSE, CAPILLARY
GLUCOSE-CAPILLARY: 148 mg/dL — AB (ref 65–99)
Glucose-Capillary: 146 mg/dL — ABNORMAL HIGH (ref 65–99)
Glucose-Capillary: 218 mg/dL — ABNORMAL HIGH (ref 65–99)
Glucose-Capillary: 146 mg/dL — ABNORMAL HIGH (ref 65–99)
Glucose-Capillary: 151 mg/dL — ABNORMAL HIGH (ref 65–99)

## 2016-07-21 LAB — LIPID PANEL
Cholesterol: 238 mg/dL — ABNORMAL HIGH (ref 0–200)
HDL: 32 mg/dL — AB (ref >40)
LDL Cholesterol: 147 mg/dL — ABNORMAL HIGH (ref 0–99)
Total CHOL/HDL Ratio: 7.4 RATIO
Triglycerides: 294 mg/dL — ABNORMAL HIGH (ref <150)
VLDL: 59 mg/dL — ABNORMAL HIGH (ref 0–40)

## 2016-07-21 MED ORDER — PRAVASTATIN SODIUM 40 MG PO TABS
40.0000 mg | ORAL_TABLET | Freq: Every day | ORAL | Status: DC
  Administered 2016-07-21 – 2016-07-22 (×2): 40 mg via ORAL
  Filled 2016-07-21 (×2): qty 1

## 2016-07-21 NOTE — Consult Note (Addendum)
Requesting Physician: Dr. elgergawy    Chief Complaint: stroke  History obtained from:  Patient     HPI:                                                                                                                                         Sharon Wilson is an 75 y.o. female with medical history significant for type 2 diabetes mellitus, hypertension, and hyperlipidemia who presents to the emergency department 2 days after symptoms occurred for evaluation of dysarthria and transient loss of coordination involving the left hand. Patient was going to bed on the morning of 07/18/2016 at 12:30 AM when she noticed that her left hand was clumsy while flossing her teeth. She went to bed and noted that her speech was odd but when asking her husband he did not feel anything was on. The next day she was talking to her kids and they noted that her speech was off. She presented to her PCP who quickly sent her to the emergency department as he felt she was likely having a stroke. At that time the only symptoms she had was dysarthria.   Currently patient is resting completely in her bed, she still has dysarthria which is notable but no other symptoms. Patient endorses that she takes aspirin 81 mg daily and has not missed any doses. Patient states that she does not smoke.  Date last known well: Date: 07/18/2016 Time last known well: Time: 00:30 tPA Given: No: out of window  Past Medical History:  Diagnosis Date  . Anemia    hysterectomy  . Diabetes mellitus   . Diverticulosis   . Hyperlipidemia   . OA (osteoarthritis)   . White coat hypertension     Past Surgical History:  Procedure Laterality Date  . ABDOMINAL HYSTERECTOMY     fibroid bleeding  . carotid dopplers  11/11   per [facility]  . CATARACT EXTRACTION  2011    Family History  Problem Relation Age of Onset  . Cancer Mother     breast   Social History:  reports that she has never smoked. She has never used smokeless tobacco. She  reports that she does not drink alcohol or use drugs.  Allergies:  Allergies  Allergen Reactions  . Ace Inhibitors Hives, Swelling and Cough  . Lovastatin Other (See Comments)    Leg cramps  Can take pravastatin  . Norvasc [Amlodipine Besylate]     Light headed and vertigo like  Also may have had hives.     Medications:  Prior to Admission:  Prescriptions Prior to Admission  Medication Sig Dispense Refill Last Dose  . aspirin 81 MG tablet Take 81 mg by mouth daily.   07/20/2016 at am  . Calcium Carbonate-Vitamin D (OSCAL 500/200 D-3 PO) Take 1 tablet by mouth 2 (two) times daily.    07/20/2016 at am  . carvedilol (COREG) 12.5 MG tablet Take 1 tablet (12.5 mg total) by mouth 2 (two) times daily with a meal. 180 tablet 3 07/20/2016 at 0930  . ibuprofen (ADVIL,MOTRIN) 200 MG tablet Take 200 mg by mouth every 6 (six) hours as needed for mild pain.   Past Week at Unknown time  . metFORMIN (GLUCOPHAGE) 500 MG tablet TAKE ONE TABLET BY MOUTH IN THE MORNING AND TWO TABS IN THE EVENING 360 tablet 0 07/20/2016 at am   Scheduled: . aspirin  325 mg Oral Daily  . enoxaparin (LOVENOX) injection  40 mg Subcutaneous Q24H  . insulin aspart  0-5 Units Subcutaneous QHS  . insulin aspart  0-9 Units Subcutaneous TID WC    ROS:                                                                                                                                       History obtained from the patient  General ROS: negative for - chills, fatigue, fever, night sweats, weight gain or weight loss Psychological ROS: negative for - behavioral disorder, hallucinations, memory difficulties, mood swings or suicidal ideation Ophthalmic ROS: negative for - blurry vision, double vision, eye pain or loss of vision ENT ROS: negative for - epistaxis, nasal discharge, oral lesions, sore throat, tinnitus or  vertigo Allergy and Immunology ROS: negative for - hives or itchy/watery eyes Hematological and Lymphatic ROS: negative for - bleeding problems, bruising or swollen lymph nodes Endocrine ROS: negative for - galactorrhea, hair pattern changes, polydipsia/polyuria or temperature intolerance Respiratory ROS: negative for - cough, hemoptysis, shortness of breath or wheezing Cardiovascular ROS: negative for - chest pain, dyspnea on exertion, edema or irregular heartbeat Gastrointestinal ROS: negative for - abdominal pain, diarrhea, hematemesis, nausea/vomiting or stool incontinence Genito-Urinary ROS: negative for - dysuria, hematuria, incontinence or urinary frequency/urgency Musculoskeletal ROS: negative for - joint swelling or muscular weakness Neurological ROS: as noted in HPI Dermatological ROS: negative for rash and skin lesion changes  Neurologic Examination:  Blood pressure (!) 185/65, pulse 79, temperature 98.6 F (37 C), temperature source Oral, resp. rate 18, height 5\' 3"  (1.6 m), weight 58.1 kg (128 lb 1.6 oz), SpO2 97 %.  HEENT-  Normocephalic, no lesions, without obvious abnormality.  Normal external eye and conjunctiva.  Normal TM's bilaterally.  Normal auditory canals and external ears. Normal external nose, mucus membranes and septum.  Normal pharynx. Cardiovascular- S1, S2 normal, pulses palpable throughout   Lungs- chest clear, no wheezing, rales, normal symmetric air entry Abdomen- normal findings: bowel sounds normal Extremities- no edema Lymph-no adenopathy palpable Musculoskeletal-no joint tenderness, deformity or swelling Skin-warm and dry, no hyperpigmentation, vitiligo, or suspicious lesions  Neurological Examination Mental Status: Alert, oriented, thought content appropriate.  Speech dysarthric without evidence of aphasia.  Able to follow 3 step commands without  difficulty. Cranial Nerves: II:  Visual fields grossly normal, pupils equal, round, reactive to light and accommodation III,IV, VI: ptosis not present, extra-ocular motions intact bilaterally V,VII: smile symmetric -however while watching her speak it was obvious the right side of her lips were moving more and she has a left nasolabial fold decrease., facial light touch sensation normal bilaterally VIII: hearing normal bilaterally IX,X: uvula rises symmetrically XI: bilateral shoulder shrug XII: midline tongue extension Motor: Right : Upper extremity   5/5    Left:     Upper extremity   5/5  Lower extremity   5/5     Lower extremity   5/5 Tone and bulk:normal tone throughout; no atrophy noted Sensory: Pinprick and light touch intact throughout, bilaterally Deep Tendon Reflexes: 2+ and symmetric throughout and brisk bilaterally Plantars: Right: downgoing   Left: downgoing Cerebellar: normal finger-to-nose and normal heel-to-shin test Gait: Not tested       Lab Results: Basic Metabolic Panel:  Recent Labs Lab 07/20/16 1520 07/20/16 1531  NA 137 138  K 4.3 4.2  CL 102 99*  CO2 27  --   GLUCOSE 146* 141*  BUN 11 13  CREATININE 0.80 0.90  CALCIUM 9.8  --     Liver Function Tests:  Recent Labs Lab 07/20/16 1520  AST 18  ALT 11*  ALKPHOS 53  BILITOT 0.8  PROT 6.8  ALBUMIN 4.2   No results for input(s): LIPASE, AMYLASE in the last 168 hours. No results for input(s): AMMONIA in the last 168 hours.  CBC:  Recent Labs Lab 07/20/16 1520 07/20/16 1531  WBC 6.2  --   NEUTROABS 4.3  --   HGB 12.7 12.9  HCT 38.4 38.0  MCV 89.9  --   PLT 217  --     Cardiac Enzymes: No results for input(s): CKTOTAL, CKMB, CKMBINDEX, TROPONINI in the last 168 hours.  Lipid Panel: No results for input(s): CHOL, TRIG, HDL, CHOLHDL, VLDL, LDLCALC in the last 168 hours.  CBG:  Recent Labs Lab 07/21/16 0213  GLUCAP 148*    Microbiology: Results for orders placed or  performed in visit on 05/30/16  Culture, Group A Strep     Status: None   Collection Time: 05/30/16  4:09 PM  Result Value Ref Range Status   Organism ID, Bacteria Normal Upper Respiratory Flora  Final   Organism ID, Bacteria No Beta Hemolytic Streptococci Isolated  Final    Coagulation Studies:  Recent Labs  07/20/16 1520  LABPROT 12.4  INR 0.93    Imaging: Dg Chest 2 View  Result Date: 07/20/2016 CLINICAL DATA:  Slurred speech today. EXAM: CHEST  2 VIEW COMPARISON:  None. FINDINGS: The  lungs are clear. The pulmonary vasculature is normal. Heart size is normal. Hilar and mediastinal contours are unremarkable. There is no pleural effusion. IMPRESSION: No active cardiopulmonary disease. Electronically Signed   By: Andreas Newport M.D.   On: 07/20/2016 21:49   Ct Head Wo Contrast  Result Date: 07/20/2016 CLINICAL DATA:  Slurred speech.  No history of stroke. EXAM: CT HEAD WITHOUT CONTRAST TECHNIQUE: Contiguous axial images were obtained from the base of the skull through the vertex without intravenous contrast. COMPARISON:  None. FINDINGS: Brain: No evidence of acute cortical infarction, hemorrhage, extra-axial collection, ventriculomegaly, or mass effect. Small area of low attenuation in the right frontoparietal region adjacent to the right lateral ventricle concerning for a lacunar infarct. Generalized cerebral atrophy. Periventricular white matter low attenuation likely secondary to microangiopathy. Vascular: Cerebrovascular atherosclerotic calcifications are noted. Skull: Negative for fracture or focal lesion. Sinuses/Orbits: Visualized portions of the orbits are unremarkable. Visualized portions of the paranasal sinuses and mastoid air cells are unremarkable. Other: None. IMPRESSION: 1. Small area of low attenuation in the right frontoparietal region adjacent to the right lateral ventricle concerning for an acute or subacute lacunar infarct. Electronically Signed   By: Kathreen Devoid   On:  07/20/2016 15:59   Mr Brain Wo Contrast  Result Date: 07/20/2016 CLINICAL DATA:  Dysarthria and transient loss of coordination of the left hand. EXAM: MRI HEAD WITHOUT CONTRAST MRA HEAD WITHOUT CONTRAST TECHNIQUE: Multiplanar, multiecho pulse sequences of the brain and surrounding structures were obtained without intravenous contrast. Angiographic images of the head were obtained using MRA technique without contrast. COMPARISON:  Head CT 07/20/2016 FINDINGS: MRI HEAD FINDINGS Brain: There is focal diffusion restriction within the right centrum semiovale, extending along the posterior limb of the right internal capsule. No acute hemorrhage identified. The midline structures are normal. There is mild periventricular T2 hyperintensity and focal hyperintense T2 weighted signal at the site of the above-described diffusion restriction. No mass lesion or midline shift. No hydrocephalus or extra-axial fluid collection. Vascular: No evidence of chronic microhemorrhage or amyloid angiopathy. Skull and upper cervical spine: The visualized skull base, calvarium, upper cervical spine and extracranial soft tissues are normal. Sinuses/Orbits: No fluid levels or advanced mucosal thickening. No mastoid effusion. Normal orbits. MRA HEAD FINDINGS Intracranial internal carotid arteries: There is a focal outpouching of the para ophthalmic left internal carotid artery (series 6, image 113). Given the appearance, this is favored to be an infundibulum or other prominent vessel origin, though an aneurysm would be difficult to exclude. There is moderate narrowing of the right intracranial internal carotid artery. Anterior cerebral arteries: Normal. Middle cerebral arteries: There is mild narrowing of the proximal left MCA M1 segment. The right middle cerebral artery shows mild multifocal narrowing. No occlusion. Posterior communicating arteries: Not visualized. Posterior cerebral arteries: Normal. Basilar artery: Normal. Vertebral  arteries: Left dominant. Normal. Superior cerebellar arteries: Normal. Anterior inferior cerebellar arteries: Not visualized. Posterior inferior cerebellar arteries: Normal. IMPRESSION: 1. Acute infarct of the right centrum semiovale in close proximity to and possibly involving the posterior limb of the right internal capsule. No associated hemorrhage or mass effect. 2. Mild-to-moderate multifocal narrowing of both middle cerebral arteries without occlusion. 3. Focal outpouching of the para ophthalmic segment of the left internal carotid artery, possibly in infundibulum, though an aneurysm would be difficult to exclude. CTA could be performed for further characterization. Electronically Signed   By: Ulyses Jarred M.D.   On: 07/20/2016 23:20   Mr Jodene Nam Head/brain [id number] Cm  Result Date:  07/20/2016 CLINICAL DATA:  Dysarthria and transient loss of coordination of the left hand. EXAM: MRI HEAD WITHOUT CONTRAST MRA HEAD WITHOUT CONTRAST TECHNIQUE: Multiplanar, multiecho pulse sequences of the brain and surrounding structures were obtained without intravenous contrast. Angiographic images of the head were obtained using MRA technique without contrast. COMPARISON:  Head CT 07/20/2016 FINDINGS: MRI HEAD FINDINGS Brain: There is focal diffusion restriction within the right centrum semiovale, extending along the posterior limb of the right internal capsule. No acute hemorrhage identified. The midline structures are normal. There is mild periventricular T2 hyperintensity and focal hyperintense T2 weighted signal at the site of the above-described diffusion restriction. No mass lesion or midline shift. No hydrocephalus or extra-axial fluid collection. Vascular: No evidence of chronic microhemorrhage or amyloid angiopathy. Skull and upper cervical spine: The visualized skull base, calvarium, upper cervical spine and extracranial soft tissues are normal. Sinuses/Orbits: No fluid levels or advanced mucosal thickening. No mastoid  effusion. Normal orbits. MRA HEAD FINDINGS Intracranial internal carotid arteries: There is a focal outpouching of the para ophthalmic left internal carotid artery (series 6, image 113). Given the appearance, this is favored to be an infundibulum or other prominent vessel origin, though an aneurysm would be difficult to exclude. There is moderate narrowing of the right intracranial internal carotid artery. Anterior cerebral arteries: Normal. Middle cerebral arteries: There is mild narrowing of the proximal left MCA M1 segment. The right middle cerebral artery shows mild multifocal narrowing. No occlusion. Posterior communicating arteries: Not visualized. Posterior cerebral arteries: Normal. Basilar artery: Normal. Vertebral arteries: Left dominant. Normal. Superior cerebellar arteries: Normal. Anterior inferior cerebellar arteries: Not visualized. Posterior inferior cerebellar arteries: Normal. IMPRESSION: 1. Acute infarct of the right centrum semiovale in close proximity to and possibly involving the posterior limb of the right internal capsule. No associated hemorrhage or mass effect. 2. Mild-to-moderate multifocal narrowing of both middle cerebral arteries without occlusion. 3. Focal outpouching of the para ophthalmic segment of the left internal carotid artery, possibly in infundibulum, though an aneurysm would be difficult to exclude. CTA could be performed for further characterization. Electronically Signed   By: Ulyses Jarred M.D.   On: 07/20/2016 23:20       Assessment and plan discussed with with attending physician and they are in agreement.    Etta Quill PA-C [facility] [phone]  07/21/2016, 8:43 AM   Assessment: 75 y.o. 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 She will need modification of secondary risk factors.   Stroke Risk Factors -  diabetes mellitus and hypertension  1. HgbA1c, fasting lipid panel 3. PT consult, OT consult, Speech consult 4.  Echocardiogram pending 5. Carotid dopplers pending 6. Prophylactic therapy-Antiplatelet med: Plavix - dose 75 mg daily 7. Risk factor modification 8. Telemetry monitoring 9. Frequent neuro checks 10 NPO until passes stroke swallow screen 11 please page stroke NP  Or  PA  Or MD from 8am -4 pm  as this patient from this time will be  followed by the stroke.   You can look them up on [email or web address]  Password [email or web address]   Roland Rack, MD [facility] [phone]  If 7pm- 7am, please page neurology on call as listed in [place].

## 2016-07-21 NOTE — Progress Notes (Signed)
OT Cancellation Note  Patient Details Name: Sharon Wilson MRN: [id number] DOB: November 19, 1940   Cancelled Treatment:    Reason Eval/Treat Not Completed: OT screened, no needs identified, will sign off. Per communication with PT and discussion with pt, pt feels she is at/near baseline with ADLs, speech is her primary concern per her report.   Hortencia Pilar 07/21/2016, 2:12 PM

## 2016-07-21 NOTE — Evaluation (Signed)
Physical Therapy Evaluation Patient Details Name: Sharon Wilson MRN: [id number] DOB: 04-30-1941 Today's Date: 07/21/2016   History of Present Illness  pt is a 75 y.o. female with medical history significant for type 2 diabetes mellitus, hypertension, and hyperlipidemia who presents to the emergency department for evaluation of dysarthria and transient loss of coordination involving the left hand. MRI cute infarct of the right centrum semiovale in close proximity to and possibly involving the posterior limb of the right internal capsule. No associated hemorrhage or mass effect, mild-o-moderate multifocal narrowing of both middle cerebral arteries without occlusion.   Clinical Impression  Pt is at or close to baseline functioning and should be safe at home with available assist. There are no further acute PT needs.  Will sign off at this time.     Follow Up Recommendations No PT follow up    Equipment Recommendations  None recommended by PT    Recommendations for Other Services       Precautions / Restrictions        Mobility  Bed Mobility                  Transfers Overall transfer level: Independent Equipment used: None                Ambulation/Gait Ambulation/Gait assistance: Independent Ambulation Distance (Feet): 500 Feet Assistive device: None Gait Pattern/deviations: WFL(Within Functional Limits) Gait velocity: community functional Gait velocity interpretation: >2.62 ft/sec, indicative of independent community ambulator General Gait Details: steady and safe see DGI  Stairs Stairs: Yes Stairs assistance: Modified independent (Device/Increase time) Stair Management: One rail Right;Alternating pattern;Step to pattern;Forwards Number of Stairs: 4 General stair comments: safe with rail   Wheelchair Mobility    Modified Rankin (Stroke Patients Only) Modified Rankin (Stroke Patients Only) Pre-Morbid Rankin Score: No symptoms Modified Rankin: No  significant disability     Balance Overall balance assessment: Independent                               Standardized Balance Assessment Standardized Balance Assessment : Dynamic Gait Index   Dynamic Gait Index Level Surface: Normal Change in Gait Speed: Normal Gait with Horizontal Head Turns: Normal Gait with Vertical Head Turns: Normal Gait and Pivot Turn: Normal Step Over Obstacle: Normal Step Around Obstacles: Normal Steps: Mild Impairment Total Score: 23       Pertinent Vitals/Pain Pain Assessment: No/denies pain    Home Living Family/patient expects to be discharged to:: Private residence Living Arrangements: Spouse/significant other Available Help at Discharge: Available 24 hours/day;Family Type of Home: House Home Access: Stairs to enter   Technical brewer of Steps: 2 Home Layout: One level Home Equipment: None      Prior Function Level of Independence: Independent               Hand Dominance   Dominant Hand: Right    Extremity/Trunk Assessment   Upper Extremity Assessment: Overall WFL for tasks assessed           Lower Extremity Assessment: Overall WFL for tasks assessed      Cervical / Trunk Assessment: Normal  Communication   Communication:  (mild dysarthria)  Cognition Arousal/Alertness: Awake/alert Behavior During Therapy: WFL for tasks assessed/performed Overall Cognitive Status: Within Functional Limits for tasks assessed                      General Comments  Exercises        Assessment/Plan    PT Assessment Patent does not need any further PT services  PT Diagnosis     PT Problem List    PT Treatment Interventions     PT Goals (Current goals can be found in the Care Plan section) Acute Rehab PT Goals PT Goal Formulation: All assessment and education complete, DC therapy    Frequency     Barriers to discharge        Co-evaluation               End of Session    Activity Tolerance: Patient tolerated treatment well Patient left: in chair;with call bell/phone within reach Nurse Communication: Mobility status    Functional Assessment Tool Used: clinical judgement Functional Limitation: Mobility: Walking and moving around Mobility: Walking and Moving Around Current Status [phone]): 0 percent impaired, limited or restricted Mobility: Walking and Moving Around Goal Status [phone]): 0 percent impaired, limited or restricted Mobility: Walking and Moving Around Discharge Status [phone]): 0 percent impaired, limited or restricted    Time: [id number] PT Time Calculation (min) (ACUTE ONLY): 18 min   Charges:   PT Evaluation $PT Eval Low Complexity: 1 Procedure     PT G Codes:   PT G-Codes **NOT FOR INPATIENT CLASS** Functional Assessment Tool Used: clinical judgement Functional Limitation: Mobility: Walking and moving around Mobility: Walking and Moving Around Current Status [id number]): 0 percent impaired, limited or restricted Mobility: Walking and Moving Around Goal Status [id number]): 0 percent impaired, limited or restricted Mobility: Walking and Moving Around Discharge Status [phone]): 0 percent impaired, limited or restricted    Shandale Malak, Tessie Fass 07/21/2016, 1:31 PM 07/21/2016  Donnella Sham, PT [phone]  (pager)

## 2016-07-21 NOTE — Evaluation (Signed)
Speech Language Pathology Evaluation Patient Details Name: Sharon Wilson MRN: [id number] DOB: [birth year] Today's Date: 07/21/2016 Time: [id number] SLP Time Calculation (min) (ACUTE ONLY): 21 min  Problem List:  Patient Active Problem List   Diagnosis Date Noted  . Acute ischemic stroke ([place]) 07/20/2016  . Dysarthria 07/20/2016  . Ischemic stroke ([place]) 07/20/2016  . Estrogen deficiency 08/06/2014  . Medicare annual wellness visit, initial 08/06/2014  . Statin intolerance 08/06/2014  . Diabetes mellitus type 2, uncontrolled, without complications ([place]) [id number]  . Chest discomfort 03/17/2014  . Body aches 03/17/2014  . Fatigue 03/17/2014  . Abnormal AST  03/17/2014  . HTN, white coat 12/13/2013  . Visit for preventive health examination 02/08/2013  . White coat hypertension  effect. 06/09/2012  . Allergic reaction caused by a drug 05/18/2012  . Allergy to angiotensin-converting enzyme (ACE) inhibitors 05/18/2012  . Stressful life event affecting family 02/29/2012  . ADVERSE REACTION TO MEDICATION 11/26/2010  . SKIN LESION, UNCERTAIN SIGNIFICANCE 10/25/2010  . SINUS TACHYCARDIA 08/01/2007  . HLD (hyperlipidemia) 05/01/2007  . DIVERTICULOSIS, COLON 05/01/2007  . COLONIC POLYPS, HX OF 05/01/2007  . Non-insulin dependent type 2 diabetes mellitus ([place]) 03/14/2007  . Essential hypertension 03/14/2007  . OSTEOARTHRITIS 03/14/2007   Past Medical History:  Past Medical History:  Diagnosis Date  . Anemia    hysterectomy  . Diabetes mellitus   . Diverticulosis   . Hyperlipidemia   . OA (osteoarthritis)   . White coat hypertension    Past Surgical History:  Past Surgical History:  Procedure Laterality Date  . ABDOMINAL HYSTERECTOMY     fibroid bleeding  . carotid dopplers  11/11   per [facility]  . CATARACT EXTRACTION  2011   HPI:  75 y.o.femalewith medical history significant for type 2 diabetes mellitus, hypertension, and hyperlipidemia who presents to the  emergency department for evaluation of dysarthria and transient loss of coordination involving the left hand. MRI cute infarct of the right centrum semiovale in close proximity to and possibly involving the posterior limb of the right internal capsule. No associated hemorrhage or mass effect, mild-o-moderate multifocal narrowing of both middle cerebral arteries without occlusion.   Assessment / Plan / Recommendation Clinical Impression  Pt's score on the MOCA cognitive assessment was a 26/30 indicating average and functional range. She exhibits mild dysarthria marked by hyponasal quality with 99% intelligibility only decreased minimally with increased rate. SLP introduced and educated pt and husband re: strategies to facilitate intelligibility in louder venues and with husband who is hard of hearing. Pt verbalized understanding and no further ST indicated.      SLP Assessment  Patient does not need any further Speech Lanaguage Pathology Services    Follow Up Recommendations  None    Frequency and Duration           SLP Evaluation Prior Functioning  Cognitive/Linguistic Baseline: Within functional limits Type of Home: House  Lives With: Spouse   Cognition  Overall Cognitive Status: Within Functional Limits for tasks assessed Arousal/Alertness: Awake/alert Orientation Level: Oriented X4 Attention: Sustained Sustained Attention: Appears intact Memory: Appears intact Awareness: Appears intact Problem Solving: Appears intact Safety/Judgment: Appears intact    Comprehension  Auditory Comprehension Overall Auditory Comprehension: Appears within functional limits for tasks assessed Visual Recognition/Discrimination Discrimination: Not tested Reading Comprehension Reading Status: Not tested    Expression Expression Primary Mode of Expression: Verbal Verbal Expression Overall Verbal Expression: Appears within functional limits for tasks assessed Pragmatics: No impairment Written  Expression Dominant Hand: Right Written Expression:  Not tested   Oral / Motor  Oral Motor/Sensory Function Overall Oral Motor/Sensory Function: Mild impairment Facial ROM: Reduced left;Suspected CN VII (facial) dysfunction Facial Symmetry: Abnormal symmetry left;Suspected CN VII (facial) dysfunction Facial Strength: Within Functional Limits Facial Sensation: Within Functional Limits Lingual ROM: Within Functional Limits Lingual Symmetry: Within Functional Limits Lingual Strength: Within Functional Limits Lingual Sensation: Within Functional Limits Velum: Within Functional Limits Mandible: Within Functional Limits Motor Speech Overall Motor Speech: Impaired Respiration: Within functional limits Phonation: Normal Resonance: Hyponasality Articulation: Within functional limitis Intelligibility: Intelligibility reduced (with increased rate and noisy environment) Word: 75-100% accurate Phrase: 75-100% accurate Sentence: 75-100% accurate Conversation: 75-100% accurate Motor Planning: Witnin functional limits Effective Techniques: Slow rate;Increased vocal intensity   GO          Functional Assessment Tool Used: skilled clinical judgement Functional Limitations: Motor speech Motor Speech Current Status [id number]): At least 20 percent but less than 40 percent impaired, limited or restricted Motor Speech Goal Status [id number]): At least 20 percent but less than 40 percent impaired, limited or restricted Motor Speech Goal Status [phone]): At least 20 percent but less than 40 percent impaired, limited or restricted         Houston Siren 07/21/2016, 10:41 AM  Orbie Pyo Colvin Caroli.[employer] [phone]

## 2016-07-21 NOTE — Progress Notes (Signed)
PROGRESS NOTE                                                                                                                                                                                                             Patient Demographics:    Sharon Wilson, is a 75 y.o. female, DOB - [birth year], [id number]  Admit date - 07/20/2016   Admitting Physician Vianne Bulls, MD  Outpatient Primary MD for the patient is Lottie Dawson, MD  LOS - 0   Chief Complaint  Patient presents with  . Aphasia  . Hypertension       Brief Narrative   75 y.o. female with medical history significant for type 2 diabetes mellitus, hypertension, and hyperlipidemia who presents to the emergency department for evaluation of dysarthria and transient loss of coordination involving the left hand.Workup significant for ischemic CVA.  Subjective:    Sharon Wilson today has, No headache, No chest pain, No abdominal pain - No Nausea,Report she still having minimal dysarthria, denies any other further weakness.  Assessment  & Plan :    Principal Problem:   Acute ischemic stroke ([place]) Active Problems:   Non-insulin dependent type 2 diabetes mellitus ([place])   HLD (hyperlipidemia)   Essential hypertension   Statin intolerance   Dysarthria   Ischemic stroke ([facility])   Ischemic CVA  - Non-contrast head CT with likely subacute lacunar infarct in right frontoparietal region  - MRI brain: Acute infarct of the right centrum semiovale in close proximity to and possibly involving the posterior limb of the right internal capsule - MRA head : Mild-to-moderate multifocal narrowing of both middle cerebral arteries without occlusion. Focal outpouching of the para ophthalmic segment of the left internal carotid artery, possibly in infundibulum, though an aneurysm would be difficult to exclude. CTA could be performed for further characterization - Aspirin 81 mg oral daily,  currently on ASA 325 mg  - 2-D echo pending - Carotid Doppler:1-39% right ICA stenosis, highest end of scale.  There is 40-59% left ICA stenosis, mid range of scale.  Vertebral artery flow is antegrade.  Bilateral ECA velocities are elevated. - Continue to monitor on telemetry - LDL is 147, started on pravastatin - PT, OT, SLP  - Nodular consult appreciated  Type II DM  - A1c 7.1% in June 2017  - Managed with  metformin only at home, will hold this while in hospital  -Continue with insulin sliding scale - Follow on repeat A1c  Hypertension  - Managed with low-dose Coreg at home - Plan to treat prn with labetalol IVP's for now    Hyperlipidemia  - Pt reports a statin-intolerance , but was able to tolerate Pravachol in the past, so we'll start on 40 mg Pravachol. - Lipid panel from June '17 with LDL 136 and HDL 35  - LDL goal will now be <70 or <100 depending on society guidelines; PCSK9 antibody, or ezetamibe could be considered  - Dietary and lifestyle measures have not yet been attempted       Code Status : Full  Family Communication  : D/W patient  Disposition Plan  : Home  Consults  :  neurology  Procedures  : none  DVT Prophylaxis  :  Lovenox   Lab Results  Component Value Date   PLT 217 07/20/2016    Antibiotics  :    Anti-infectives    None        Objective:   Vitals:   07/21/16 1038 07/21/16 1046 07/21/16 1200 07/21/16 1400  BP: (!) 208/74 (!) 190/78 (!) 156/66 (!) 166/72  Pulse: 88  78 83  Resp:   18 18  Temp: 98.4 F (36.9 C)  98.6 F (37 C) 98.1 F (36.7 C)  TempSrc: Oral  Oral Oral  SpO2: 96%  97% 97%  Weight:      Height:        Wt Readings from Last 3 Encounters:  07/20/16 58.1 kg (128 lb 1.6 oz)  07/20/16 58.8 kg (129 lb 9.6 oz)  05/30/16 59.7 kg (131 lb 9.6 oz)     Intake/Output Summary (Last 24 hours) at 07/21/16 1558 Last data filed at 07/21/16 1335  Gross per 24 hour  Intake           831.67 ml  Output                 0 ml  Net           831.67 ml     Physical Exam  Awake Alert, Oriented X 3, Very minimal left sided droop, no dysarthria Supple Neck,No JVD,   Symmetrical Chest wall movement, Good air movement bilaterally, CTAB RRR,No Gallops,Rubs or new Murmurs, No Parasternal Heave +ve B.Sounds, Abd Soft, No tenderness, No rebound - guarding or rigidity. No Cyanosis, Clubbing or edema, No new Rash or bruise      Data Review:    CBC  Recent Labs Lab 07/20/16 1520 07/20/16 1531  WBC 6.2  --   HGB 12.7 12.9  HCT 38.4 38.0  PLT 217  --   MCV 89.9  --   MCH 29.7  --   MCHC 33.1  --   RDW 12.5  --   LYMPHSABS 1.0  --   MONOABS 0.5  --   EOSABS 0.3  --   BASOSABS 0.0  --     Chemistries   Recent Labs Lab 07/20/16 1520 07/20/16 1531  NA 137 138  K 4.3 4.2  CL 102 99*  CO2 27  --   GLUCOSE 146* 141*  BUN 11 13  CREATININE 0.80 0.90  CALCIUM 9.8  --   AST 18  --   ALT 11*  --   ALKPHOS 53  --   BILITOT 0.8  --    ------------------------------------------------------------------------------------------------------------------  Recent Labs  07/21/16 0744  CHOL  238*  HDL 32*  LDLCALC 147*  TRIG 294*  CHOLHDL 7.4    Lab Results  Component Value Date   HGBA1C 7.1 (H) 05/05/2016   ------------------------------------------------------------------------------------------------------------------ No results for input(s): TSH, T4TOTAL, T3FREE, THYROIDAB in the last 72 hours.  Invalid input(s): FREET3 ------------------------------------------------------------------------------------------------------------------ No results for input(s): VITAMINB12, FOLATE, FERRITIN, TIBC, IRON, RETICCTPCT in the last 72 hours.  Coagulation profile  Recent Labs Lab 07/20/16 1520  INR 0.93    No results for input(s): DDIMER in the last 72 hours.  Cardiac Enzymes No results for input(s): CKMB, TROPONINI, MYOGLOBIN in the last 168 hours.  Invalid input(s):  CK ------------------------------------------------------------------------------------------------------------------ No results found for: BNP  Inpatient Medications  Scheduled Meds: . aspirin  325 mg Oral Daily  . enoxaparin (LOVENOX) injection  40 mg Subcutaneous Q24H  . insulin aspart  0-5 Units Subcutaneous QHS  . insulin aspart  0-9 Units Subcutaneous TID WC   Continuous Infusions:  PRN Meds:.acetaminophen **OR** acetaminophen, labetalol, senna-docusate  Micro Results No results found for this or any previous visit (from the past 240 hour(s)).  Radiology Reports Dg Chest 2 View  Result Date: 07/20/2016 CLINICAL DATA:  Slurred speech today. EXAM: CHEST  2 VIEW COMPARISON:  None. FINDINGS: The lungs are clear. The pulmonary vasculature is normal. Heart size is normal. Hilar and mediastinal contours are unremarkable. There is no pleural effusion. IMPRESSION: No active cardiopulmonary disease. Electronically Signed   By: Andreas Newport M.D.   On: 07/20/2016 21:49   Ct Head Wo Contrast  Result Date: 07/20/2016 CLINICAL DATA:  Slurred speech.  No history of stroke. EXAM: CT HEAD WITHOUT CONTRAST TECHNIQUE: Contiguous axial images were obtained from the base of the skull through the vertex without intravenous contrast. COMPARISON:  None. FINDINGS: Brain: No evidence of acute cortical infarction, hemorrhage, extra-axial collection, ventriculomegaly, or mass effect. Small area of low attenuation in the right frontoparietal region adjacent to the right lateral ventricle concerning for a lacunar infarct. Generalized cerebral atrophy. Periventricular white matter low attenuation likely secondary to microangiopathy. Vascular: Cerebrovascular atherosclerotic calcifications are noted. Skull: Negative for fracture or focal lesion. Sinuses/Orbits: Visualized portions of the orbits are unremarkable. Visualized portions of the paranasal sinuses and mastoid air cells are unremarkable. Other: None.  IMPRESSION: 1. Small area of low attenuation in the right frontoparietal region adjacent to the right lateral ventricle concerning for an acute or subacute lacunar infarct. Electronically Signed   By: Kathreen Devoid   On: 07/20/2016 15:59   Mr Brain Wo Contrast  Result Date: 07/20/2016 CLINICAL DATA:  Dysarthria and transient loss of coordination of the left hand. EXAM: MRI HEAD WITHOUT CONTRAST MRA HEAD WITHOUT CONTRAST TECHNIQUE: Multiplanar, multiecho pulse sequences of the brain and surrounding structures were obtained without intravenous contrast. Angiographic images of the head were obtained using MRA technique without contrast. COMPARISON:  Head CT 07/20/2016 FINDINGS: MRI HEAD FINDINGS Brain: There is focal diffusion restriction within the right centrum semiovale, extending along the posterior limb of the right internal capsule. No acute hemorrhage identified. The midline structures are normal. There is mild periventricular T2 hyperintensity and focal hyperintense T2 weighted signal at the site of the above-described diffusion restriction. No mass lesion or midline shift. No hydrocephalus or extra-axial fluid collection. Vascular: No evidence of chronic microhemorrhage or amyloid angiopathy. Skull and upper cervical spine: The visualized skull base, calvarium, upper cervical spine and extracranial soft tissues are normal. Sinuses/Orbits: No fluid levels or advanced mucosal thickening. No mastoid effusion. Normal orbits. MRA HEAD FINDINGS Intracranial internal  carotid arteries: There is a focal outpouching of the para ophthalmic left internal carotid artery (series 6, image 113). Given the appearance, this is favored to be an infundibulum or other prominent vessel origin, though an aneurysm would be difficult to exclude. There is moderate narrowing of the right intracranial internal carotid artery. Anterior cerebral arteries: Normal. Middle cerebral arteries: There is mild narrowing of the proximal left MCA  M1 segment. The right middle cerebral artery shows mild multifocal narrowing. No occlusion. Posterior communicating arteries: Not visualized. Posterior cerebral arteries: Normal. Basilar artery: Normal. Vertebral arteries: Left dominant. Normal. Superior cerebellar arteries: Normal. Anterior inferior cerebellar arteries: Not visualized. Posterior inferior cerebellar arteries: Normal. IMPRESSION: 1. Acute infarct of the right centrum semiovale in close proximity to and possibly involving the posterior limb of the right internal capsule. No associated hemorrhage or mass effect. 2. Mild-to-moderate multifocal narrowing of both middle cerebral arteries without occlusion. 3. Focal outpouching of the para ophthalmic segment of the left internal carotid artery, possibly in infundibulum, though an aneurysm would be difficult to exclude. CTA could be performed for further characterization. Electronically Signed   By: Ulyses Jarred M.D.   On: 07/20/2016 23:20   Mr Jodene Nam Head/brain [id number] Cm  Result Date: 07/20/2016 CLINICAL DATA:  Dysarthria and transient loss of coordination of the left hand. EXAM: MRI HEAD WITHOUT CONTRAST MRA HEAD WITHOUT CONTRAST TECHNIQUE: Multiplanar, multiecho pulse sequences of the brain and surrounding structures were obtained without intravenous contrast. Angiographic images of the head were obtained using MRA technique without contrast. COMPARISON:  Head CT 07/20/2016 FINDINGS: MRI HEAD FINDINGS Brain: There is focal diffusion restriction within the right centrum semiovale, extending along the posterior limb of the right internal capsule. No acute hemorrhage identified. The midline structures are normal. There is mild periventricular T2 hyperintensity and focal hyperintense T2 weighted signal at the site of the above-described diffusion restriction. No mass lesion or midline shift. No hydrocephalus or extra-axial fluid collection. Vascular: No evidence of chronic microhemorrhage or amyloid  angiopathy. Skull and upper cervical spine: The visualized skull base, calvarium, upper cervical spine and extracranial soft tissues are normal. Sinuses/Orbits: No fluid levels or advanced mucosal thickening. No mastoid effusion. Normal orbits. MRA HEAD FINDINGS Intracranial internal carotid arteries: There is a focal outpouching of the para ophthalmic left internal carotid artery (series 6, image 113). Given the appearance, this is favored to be an infundibulum or other prominent vessel origin, though an aneurysm would be difficult to exclude. There is moderate narrowing of the right intracranial internal carotid artery. Anterior cerebral arteries: Normal. Middle cerebral arteries: There is mild narrowing of the proximal left MCA M1 segment. The right middle cerebral artery shows mild multifocal narrowing. No occlusion. Posterior communicating arteries: Not visualized. Posterior cerebral arteries: Normal. Basilar artery: Normal. Vertebral arteries: Left dominant. Normal. Superior cerebellar arteries: Normal. Anterior inferior cerebellar arteries: Not visualized. Posterior inferior cerebellar arteries: Normal. IMPRESSION: 1. Acute infarct of the right centrum semiovale in close proximity to and possibly involving the posterior limb of the right internal capsule. No associated hemorrhage or mass effect. 2. Mild-to-moderate multifocal narrowing of both middle cerebral arteries without occlusion. 3. Focal outpouching of the para ophthalmic segment of the left internal carotid artery, possibly in infundibulum, though an aneurysm would be difficult to exclude. CTA could be performed for further characterization. Electronically Signed   By: Ulyses Jarred M.D.   On: 07/20/2016 23:20     Waldron Labs, DAWOOD M.D on 07/21/2016 at 3:58 PM  Between 7am to 7pm -  Pager - [phone]  After 7pm go to [email or web address] - password [facility]  [facility] -  Office  [phone]

## 2016-07-21 NOTE — Progress Notes (Signed)
VASCULAR LAB PRELIMINARY  PRELIMINARY  PRELIMINARY  PRELIMINARY  Carotid duplex completed.    Preliminary report:  There is 1-39% right ICA stenosis, highest end of scale.  There is 40-59% left ICA stenosis, mid range of scale.  Vertebral artery flow is antegrade.  Bilateral ECA velocities are elevated.  Ozzie Knobel, RVT 07/21/2016, 10:35 AM

## 2016-07-22 ENCOUNTER — Inpatient Hospital Stay (HOSPITAL_COMMUNITY): Payer: Medicare Other

## 2016-07-22 ENCOUNTER — Other Ambulatory Visit: Payer: Self-pay | Admitting: Neurology

## 2016-07-22 DIAGNOSIS — I63311 Cerebral infarction due to thrombosis of right middle cerebral artery: Secondary | ICD-10-CM

## 2016-07-22 DIAGNOSIS — I6789 Other cerebrovascular disease: Secondary | ICD-10-CM

## 2016-07-22 LAB — VAS US CAROTID
LCCADDIAS: -22 cm/s
LCCADSYS: -107 cm/s
LEFT ECA DIAS: -11 cm/s
LEFT VERTEBRAL DIAS: -34 cm/s
LICAPDIAS: -49 cm/s
LICAPSYS: -217 cm/s
Left CCA prox dias: 21 cm/s
Left CCA prox sys: 98 cm/s
Left ICA dist dias: -39 cm/s
Left ICA dist sys: -107 cm/s
RCCAPDIAS: -17 cm/s
RCCAPSYS: -93 cm/s
RIGHT ECA DIAS: 5 cm/s
RIGHT VERTEBRAL DIAS: -27 cm/s
Right cca dist sys: -103 cm/s

## 2016-07-22 LAB — BASIC METABOLIC PANEL
ANION GAP: 7 (ref 5–15)
BUN: 12 mg/dL (ref 6–20)
CALCIUM: 8.9 mg/dL (ref 8.9–10.3)
CO2: 25 mmol/L (ref 22–32)
CREATININE: 0.81 mg/dL (ref 0.44–1.00)
Chloride: 107 mmol/L (ref 101–111)
Glucose, Bld: 175 mg/dL — ABNORMAL HIGH (ref 65–99)
Potassium: 3.9 mmol/L (ref 3.5–5.1)
Sodium: 139 mmol/L (ref 135–145)

## 2016-07-22 LAB — CBC
HCT: 37.7 % (ref 36.0–46.0)
Hemoglobin: 12.3 g/dL (ref 12.0–15.0)
MCH: 29.2 pg (ref 26.0–34.0)
MCHC: 32.6 g/dL (ref 30.0–36.0)
MCV: 89.5 fL (ref 78.0–100.0)
PLATELETS: 196 10*3/uL (ref 150–400)
RBC: 4.21 MIL/uL (ref 3.87–5.11)
RDW: 12.4 % (ref 11.5–15.5)
WBC: 6.1 10*3/uL (ref 4.0–10.5)

## 2016-07-22 LAB — GLUCOSE, CAPILLARY: Glucose-Capillary: 170 mg/dL — ABNORMAL HIGH (ref 65–99)

## 2016-07-22 LAB — ECHOCARDIOGRAM COMPLETE
Height: 63 in
Weight: 2049.6 oz

## 2016-07-22 LAB — HEMOGLOBIN A1C
Hgb A1c MFr Bld: 7.3 % — ABNORMAL HIGH (ref 4.8–5.6)
Mean Plasma Glucose: 163 mg/dL

## 2016-07-22 MED ORDER — PRAVASTATIN SODIUM 40 MG PO TABS: 40.0000 mg | ORAL_TABLET | Freq: Every day | ORAL | 0 refills | Status: DC

## 2016-07-22 MED ORDER — CLOPIDOGREL BISULFATE 75 MG PO TABS: 75.0000 mg | ORAL_TABLET | Freq: Every day | ORAL | 0 refills | Status: DC

## 2016-07-22 MED ORDER — CLOPIDOGREL BISULFATE 75 MG PO TABS
75.0000 mg | ORAL_TABLET | Freq: Every day | ORAL | Status: DC
  Administered 2016-07-22: 75 mg via ORAL
  Filled 2016-07-22: qty 1

## 2016-07-22 MED ORDER — CARVEDILOL 12.5 MG PO TABS
12.5000 mg | ORAL_TABLET | Freq: Two times a day (BID) | ORAL | Status: DC
  Administered 2016-07-22 (×2): 12.5 mg via ORAL
  Filled 2016-07-22 (×2): qty 1

## 2016-07-22 NOTE — Discharge Instructions (Signed)
Follow with Primary MD Lottie Dawson, MD in 7 days   Get CBC, CMP,  checked  by Primary MD next visit.    Activity: As tolerated with Full fall precautions use walker/cane & assistance as needed   Disposition Home    Diet: Heart Healthy , carbohydrate modified , with feeding assistance and aspiration precautions.  For Heart failure patients - Check your Weight same time everyday, if you gain over 2 pounds, or you develop in leg swelling, experience more shortness of breath or chest pain, call your Primary MD immediately. Follow Cardiac Low Salt Diet and 1.5 lit/day fluid restriction.   On your next visit with your primary care physician please Get Medicines reviewed and adjusted.   Please request your Prim.MD to go over all Hospital Tests and Procedure/Radiological results at the follow up, please get all Hospital records sent to your Prim MD by signing hospital release before you go home.   If you experience worsening of your admission symptoms, develop shortness of breath, life threatening emergency, suicidal or homicidal thoughts you must seek medical attention immediately by calling 911 or calling your MD immediately  if symptoms less severe.  You Must read complete instructions/literature along with all the possible adverse reactions/side effects for all the Medicines you take and that have been prescribed to you. Take any new Medicines after you have completely understood and accpet all the possible adverse reactions/side effects.   Do not drive, operating heavy machinery, perform activities at heights, swimming or participation in water activities or provide baby sitting services if your were admitted for syncope or siezures until you have seen by Primary MD or a Neurologist and advised to do so again.  Do not drive when taking Pain medications.    Do not take more than prescribed Pain, Sleep and Anxiety Medications  Special Instructions: If you have smoked or chewed  Tobacco  in the last 2 yrs please stop smoking, stop any regular Alcohol  and or any Recreational drug use.  Wear Seat belts while driving.   Please note  You were cared for by a hospitalist during your hospital stay. If you have any questions about your discharge medications or the care you received while you were in the hospital after you are discharged, you can call the unit and asked to speak with the hospitalist on call if the hospitalist that took care of you is not available. Once you are discharged, your primary care physician will handle any further medical issues. Please note that NO REFILLS for any discharge medications will be authorized once you are discharged, as it is imperative that you return to your primary care physician (or establish a relationship with a primary care physician if you do not have one) for your aftercare needs so that they can reassess your need for medications and monitor your lab values.

## 2016-07-22 NOTE — Telephone Encounter (Signed)
Received message from [place] patient needs new patient appointment with cardiologist for abnormal echo.Message sent to scheduling pool for appointment.

## 2016-07-22 NOTE — Progress Notes (Signed)
Patient is discharged from room 5M13 at this time. Alert and in stable condition. IV site d/c'd as well as tele. Instructions read to patient with understanding verbalized. Left unit via wheelchair with husband and all belongings at side.

## 2016-07-22 NOTE — Progress Notes (Signed)
STROKE TEAM PROGRESS NOTE   HISTORY OF PRESENT ILLNESS (per record) Sharon Wilson is an 75 y.o. female with medical history significant for type 2 diabetes mellitus, hypertension, and hyperlipidemia who presents to the emergency department 2 days after symptoms occurred for evaluation of dysarthria and transient loss of coordination involving the left hand. Patient was going to bed on the morning of 07/18/2016 at 12:30 AM when she noticed that her left hand was clumsy while flossing her teeth. She went to bed and noted that her speech was odd but when asking her husband he did not feel anything was on. The next day she was talking to her kids and they noted that her speech was off. She presented to her PCP who quickly sent her to the emergency department as he felt she was likely having a stroke. At that time the only symptoms she had was dysarthria.   Currently patient is resting completely in her bed, she still has dysarthria which is notable but no other symptoms. Patient endorses that she takes aspirin 81 mg daily and has not missed any doses. Patient states that she does not smoke.  Date last known well: Date: 07/18/2016 Time last known well: Time: 00:30 tPA Given: No: out of window   SUBJECTIVE (INTERVAL HISTORY) Her husband is at the bedside.  Overall she feels her condition is rapidly improving. She still has left facial droop and slurry speech but left sided weakness resolved.    OBJECTIVE Temp:  [97.7 F (36.5 C)-98.7 F (37.1 C)] 98.2 F (36.8 C) (09/15 0622) Pulse Rate:  [73-88] 78 (09/15 0622) Cardiac Rhythm: Heart block (09/14 1900) Resp:  [18-20] 18 (09/15 0622) BP: (156-208)/(65-87) 183/71 (09/15 0622) SpO2:  [96 %-99 %] 97 % (09/15 0622)  CBC:  Recent Labs Lab 07/20/16 1520 07/20/16 1531 07/22/16 0534  WBC 6.2  --  6.1  NEUTROABS 4.3  --   --   HGB 12.7 12.9 12.3  HCT 38.4 38.0 37.7  MCV 89.9  --  89.5  PLT 217  --  123456    Basic Metabolic Panel:   Recent Labs Lab 07/20/16 1520 07/20/16 1531 07/22/16 0534  NA 137 138 139  K 4.3 4.2 3.9  CL 102 99* 107  CO2 27  --  25  GLUCOSE 146* 141* 175*  BUN 11 13 12   CREATININE 0.80 0.90 0.81  CALCIUM 9.8  --  8.9    Lipid Panel:    Component Value Date/Time   CHOL 238 (H) 07/21/2016 0744   TRIG 294 (H) 07/21/2016 0744   HDL 32 (L) 07/21/2016 0744   CHOLHDL 7.4 07/21/2016 0744   VLDL 59 (H) 07/21/2016 0744   LDLCALC 147 (H) 07/21/2016 0744   HgbA1c:  Lab Results  Component Value Date   HGBA1C 7.3 (H) 07/21/2016   Urine Drug Screen: No results found for: LABOPIA, COCAINSCRNUR, LABBENZ, AMPHETMU, THCU, LABBARB    IMAGING I have personally reviewed the radiological images below and agree with the radiology interpretations.  Dg Chest 2 View 07/20/2016 No active cardiopulmonary disease.   Ct Head Wo Contrast 07/20/2016 1. Small area of low attenuation in the right frontoparietal region adjacent to the right lateral ventricle concerning for an acute or subacute lacunar infarct.   Mr Jodene Nam Head/brain Wo Cm 07/20/2016 1. Acute infarct of the right centrum semiovale in close proximity to and possibly involving the posterior limb of the right internal capsule. No associated hemorrhage or mass effect.  2. Mild-to-moderate multifocal narrowing  of both middle cerebral arteries without occlusion.  3. Focal outpouching of the para ophthalmic segment of the left internal carotid artery, possibly in infundibulum, though an aneurysm would be difficult to exclude.  CTA could be performed for further characterization.  CUS - There is 1-39% right ICA stenosis, highest end of scale.  There is 40-59% left ICA stenosis, mid range of scale.  Vertebral artery flow is antegrade.  Bilateral ECA velocities are elevated.  2D echo - Left ventricle: The cavity size was normal. Systolic function was   normal. The estimated ejection fraction was in the range of 50%   to 55%. There is akinesis of the  basal-midinferolateral   myocardium. Doppler parameters are consistent with abnormal left   ventricular relaxation (grade 1 diastolic dysfunction). Doppler   parameters are consistent with high ventricular filling pressure. - Left atrium: The atrium was mildly dilated. Volume/bsa, ES   (1-plane Simpson&'s, A4C): 38.3 ml/m^2. Impressions: - No cardiac source of emboli was indentified.   PHYSICAL EXAM HEENT-  Normocephalic, no lesions, without obvious abnormality.  Normal external eye and conjunctiva.  Normal TM's bilaterally.  Normal auditory canals and external ears. Normal external nose, mucus membranes and septum.  Normal pharynx. Cardiovascular- S1, S2 normal, pulses palpable throughout   Lungs- chest clear, no wheezing, rales, normal symmetric air entry Abdomen- normal findings: bowel sounds normal Extremities- no edema Lymph-no adenopathy palpable Musculoskeletal-no joint tenderness, deformity or swelling Skin-warm and dry, no hyperpigmentation, vitiligo, or suspicious lesions  Neurological Examination Mental Status: Alert, oriented, thought content appropriate.  Speech dysarthric without evidence of aphasia.  Able to follow 3 step commands without difficulty. Cranial Nerves: II:  Visual fields grossly normal, pupils equal, round, reactive to light and accommodation III,IV, VI: ptosis not present, extra-ocular motions intact bilaterally V,VII: left facial droop  VIII: hearing normal bilaterally IX,X: uvula rises symmetrically, mild dysarthria XI: bilateral shoulder shrug XII: midline tongue extension Motor: Right :  Upper extremity   5/5                                      Left:     Upper extremity   5/5             Lower extremity   5/5                                                  Lower extremity   5/5 Tone and bulk:normal tone throughout; no atrophy noted Sensory: Pinprick and light touch intact throughout, bilaterally Deep Tendon Reflexes: 2+ and symmetric throughout  and brisk bilaterally Plantars: Right: downgoing                                Left: downgoing Cerebellar: normal finger-to-nose and normal heel-to-shin test Gait: Not tested    ASSESSMENT/PLAN [name] is a 75 y.o. female with history of hypertension, hyperlipidemia, diabetes mellitus, and anemia presenting with dysarthria and left hand clumsiness. She did not receive IV t-PA due to late presentation.  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  Resultant  Left facial droop  MRI - Acute infarct of the right BG/CR infarct  MRA - diffuse athero  including b/l tICA, b/ MCA, BA, and right PCA  Carotid Doppler - 40-59% left ICA stenosis  2D Echo - EF 50-55%.  LDL - 147  HgbA1c - 7.3  VTE prophylaxis - Lovenox  Diet heart healthy/carb modified Room service appropriate? Yes; Fluid consistency: Thin  aspirin 81 mg daily prior to admission, now on clopidogrel 75 mg daily.  Patient counseled to be compliant with her antithrombotic medications  Ongoing aggressive stroke risk factor management  Therapy recommendations: - No PT follow up recommended.  Disposition:  Pending  Left ICA stenosis, asymptomatic  CUS - left ICA 40-59%  No intervention indicated  Close monitoring with outpt CUS  Hypertension  Somewhat high blood pressure  Permissive hypertension (OK if < 220/120) but gradually normalize in 5-7 days  Long-term BP goal normotensive  Hyperlipidemia  Home meds: none due to statin allergy  LDL 147, goal < 70  Now on Pravachol 40 mg daily.  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  Please call with questions. Pt will follow up with Cecille Rubin NP at [facility] in about 6 weeks. Thanks for the consult.  Rosalin Hawking, MD PhD Stroke Neurology 07/22/2016 10:20 PM    To contact Stroke Continuity provider, please refer to [email or web address] After hours, contact General Neurology

## 2016-07-22 NOTE — Discharge Summary (Signed)
Sharon Wilson, is a 75 y.o. female  DOB 1941-05-19  MRN [id number].  Admission date:  07/20/2016  Admitting Physician  Vianne Bulls, MD  Discharge Date:  07/22/2016   Primary MD  Lottie Dawson, MD  Recommendations for primary care physician for things to follow:  - Please check CBC, BMP during next visit, check lipid panel in 2 month, patient will need further adjustment of her antihypertensive medication if needed, be discharged on home regimen Coreg, please reassess your next visit on the office and increase if needed. - Patient to follow with neurology in 8 weeks from discharge, neurology will call with a follow-up appointment  - Patient will need outpatient follow-up with cardiology regarding abnormal findings, cardiology will call Monday with a follow-up appointment.   Admission Diagnosis  Stroke (cerebrum) (HCC) [I63.9] Cerebral infarction due to unspecified mechanism [I63.9]   Discharge Diagnosis  Stroke (cerebrum) ([place]) [I63.9] Cerebral infarction due to unspecified mechanism [I63.9]    Principal Problem:   Acute ischemic stroke [facility]) Active Problems:   Non-insulin dependent type 2 diabetes mellitus ([place])   HLD (hyperlipidemia)   Essential hypertension   Statin intolerance   Dysarthria   Ischemic stroke [facility])      Past Medical History:  Diagnosis Date  . Anemia    hysterectomy  . Diabetes mellitus   . Diverticulosis   . Hyperlipidemia   . OA (osteoarthritis)   . White coat hypertension     Past Surgical History:  Procedure Laterality Date  . ABDOMINAL HYSTERECTOMY     fibroid bleeding  . carotid dopplers  11/11   per [facility]  . CATARACT EXTRACTION  2011       History of present illness and  Hospital Course:     Kindly see H&P for history of present illness and admission details, please review complete Labs, Consult reports and Test reports for all  details in brief  HPI  from the history and physical done on the day of admission 07/20/2016  HPI: Sharon Wilson is a 75 y.o. female with medical history significant for type 2 diabetes mellitus, hypertension, and hyperlipidemia who presents to the emergency department for evaluation of dysarthria and transient loss of coordination involving the left hand. The patient reports that she had been enjoying good health until early in the morning of 07/17/2016, when she was trying to floss her teeth, but was unable to due to a loss of coordination in her left hand. There was concomitant development of dysarthria, but there is been no headache, change in vision or hearing, focal numbness or weakness, difficulty swallowing, or cough. Patient denies any chest pain or palpitations associated with this, and has never experienced similar symptoms previously. She reports that the left hand symptoms resolved spontaneously over the course of a couple minutes but the dysarthria has persisted. She feels that sometimes her speech is more affected than others. She denies any difficulty with word finding, but reports problems with pronunciation, worse when she is speaking more rapidly. Patient  saw her primary care physician for evaluation of these complaints earlier in the day, was suspected of having a stroke, and was directed to the emergency department for further evaluation.  ED Course: Upon arrival to the ED, patient is found to be afebrile, saturating well on room air, hypertensive to 220/90, and with vitals otherwise stable. EKG demonstrates normal sinus rhythm with left axis deviation, INR is within the normal limits, and troponin is undetectable. Basic lab work, including CMP and CBC with differential are essentially normal. Noncontrast head CT reveals a small area of low attenuation in the right frontoparietal region concerning for acute or subacute lacunar infarct. Patient was treated with 325 mg aspirin and 10  mg IV hydralazine in the emergency department. Neurology has been consulted by the ED physician. Patient has remained stable in the ED and will be observed on the telemetry unit for ongoing evaluation and management of dysarthria suspected secondary to subacute ischemic CVA.   Hospital Course  75 y.o.femalewith medical history significant for type 2 diabetes mellitus, hypertension, and hyperlipidemia who presents to the emergency department for evaluation of dysarthria and transient loss of coordination involving the left hand.Workup significant for ischemic CVA.  Ischemic CVA  - Non-contrast head CT with likely subacute lacunar infarct in right frontoparietal region  - MRI brain: Acute infarct of the right centrum semiovale in close proximity to and possibly involving the posterior limb of the right internal capsule - MRA head : Mild-to-moderate multifocal narrowing of both middle cerebral arteries without occlusion. Focal outpouching of the para ophthalmic segment of the left internal carotid artery, possibly in infundibulum, though an aneurysm would be difficult to exclude. CTA could be performed for further characterization - Aspirin 81 mg oral daily, currently on ASA 325 mg  - 2-D echo is a preserved EF [id number], grade 1 diastolic dysfunction, and I continues his in basal mid inferior lateral myocardium - Carotid Doppler:1-39% right ICA stenosis, highest end of scale. There is 40-59% left ICA stenosis, mid range of scale. Vertebral artery flow is antegrade. Bilateral ECA velocities are elevated. - Evidence of arrhythmias on telemetry - LDL is 147, started on pravastatin - Seen by PT, OT, SLP , no follow-up needed on discharge - Neurology consult greatly appreciated, felt secondary to  small vessel disease, mentation is to start Plavix 75 mg oral daily, and stop aspirin.  Abnormal finding on echocardiogram - Patient with evidence of akenises of basal mid inferior lateral myocardium,  discussed with cardiology, they will arrange for outpatient follow-up  Type II DM  - A1c 7.1% in June 2017  - Managed with metformin only at home,  hemoglobin A1c is 7.4, no medication change on discharge  Hypertension  - Managed with low-dose Coreg at home, blood pressure slightly on the higher side in hospital stay , she did report blood pressure is controlled at baseline, will discharge on home medication Coreg, Zetia cyst by PCP and adjust as needed .   Hyperlipidemia  - Pt reports a statin-intolerance , but was able to tolerate Pravachol in the past, so  started on 40 mg Pravachol. - Lipid panel from June '17 with LDL 136 and HDL 35  - LDL goal will now be <70 or <100 depending on society guidelines; PCSK9 antibody, or ezetamibe could be considered  - Dietary and lifestyle measures have not yet been attempted     Discharge Condition:  Stable discussed with husband   Follow UP  Follow-up Information    Lottie Dawson, MD.  Schedule an appointment as soon as possible for a visit in 1 week(s).   Specialties:  Internal Medicine, Pediatrics Contact information: [address] [place] [address] [phone]             Discharge Instructions  and  Discharge Medications     Discharge Instructions    Ambulatory referral to Neurology    Complete by:  As directed    An appointment is requested in approximately: 8 weeks   Discharge instructions    Complete by:  As directed    Follow with Primary MD Lottie Dawson, MD in 7 days   Get CBC, CMP,  checked  by Primary MD next visit.    Activity: As tolerated with Full fall precautions use walker/cane & assistance as needed   Disposition Home    Diet: Heart Healthy , carbohydrate modified , with feeding assistance and aspiration precautions.  For Heart failure patients - Check your Weight same time everyday, if you gain over 2 pounds, or you develop in leg swelling, experience more  shortness of breath or chest pain, call your Primary MD immediately. Follow Cardiac Low Salt Diet and 1.5 lit/day fluid restriction.   On your next visit with your primary care physician please Get Medicines reviewed and adjusted.   Please request your Prim.MD to go over all Hospital Tests and Procedure/Radiological results at the follow up, please get all Hospital records sent to your Prim MD by signing hospital release before you go home.   If you experience worsening of your admission symptoms, develop shortness of breath, life threatening emergency, suicidal or homicidal thoughts you must seek medical attention immediately by calling 911 or calling your MD immediately  if symptoms less severe.  You Must read complete instructions/literature along with all the possible adverse reactions/side effects for all the Medicines you take and that have been prescribed to you. Take any new Medicines after you have completely understood and accpet all the possible adverse reactions/side effects.   Do not drive, operating heavy machinery, perform activities at heights, swimming or participation in water activities or provide baby sitting services if your were admitted for syncope or siezures until you have seen by Primary MD or a Neurologist and advised to do so again.  Do not drive when taking Pain medications.    Do not take more than prescribed Pain, Sleep and Anxiety Medications  Special Instructions: If you have smoked or chewed Tobacco  in the last 2 yrs please stop smoking, stop any regular Alcohol  and or any Recreational drug use.  Wear Seat belts while driving.   Please note  You were cared for by a hospitalist during your hospital stay. If you have any questions about your discharge medications or the care you received while you were in the hospital after you are discharged, you can call the unit and asked to speak with the hospitalist on call if the hospitalist that took care of you is  not available. Once you are discharged, your primary care physician will handle any further medical issues. Please note that NO REFILLS for any discharge medications will be authorized once you are discharged, as it is imperative that you return to your primary care physician (or establish a relationship with a primary care physician if you do not have one) for your aftercare needs so that they can reassess your need for medications and monitor your lab values.   Increase activity slowly    Complete by:  As directed  Medication List    STOP taking these medications   aspirin 81 MG tablet   ibuprofen 200 MG tablet Commonly known as:  ADVIL,MOTRIN     TAKE these medications   carvedilol 12.5 MG tablet Commonly known as:  COREG Take 1 tablet (12.5 mg total) by mouth 2 (two) times daily with a meal.   clopidogrel 75 MG tablet Commonly known as:  PLAVIX Take 1 tablet (75 mg total) by mouth daily.   metFORMIN 500 MG tablet Commonly known as:  GLUCOPHAGE TAKE ONE TABLET BY MOUTH IN THE MORNING AND TWO TABS IN THE EVENING   OSCAL 500/200 D-3 PO Take 1 tablet by mouth 2 (two) times daily.   pravastatin 40 MG tablet Commonly known as:  PRAVACHOL Take 1 tablet (40 mg total) by mouth daily at 6 PM.         Diet and Activity recommendation: See Discharge Instructions above   Consults obtained -  Neurology   Major procedures and Radiology Reports - PLEASE review detailed and final reports for all details, in brief -     Dg Chest 2 View  Result Date: 07/20/2016 CLINICAL DATA:  Slurred speech today. EXAM: CHEST  2 VIEW COMPARISON:  None. FINDINGS: The lungs are clear. The pulmonary vasculature is normal. Heart size is normal. Hilar and mediastinal contours are unremarkable. There is no pleural effusion. IMPRESSION: No active cardiopulmonary disease. Electronically Signed   By: Andreas Newport M.D.   On: 07/20/2016 21:49   Ct Head Wo Contrast  Result Date:  07/20/2016 CLINICAL DATA:  Slurred speech.  No history of stroke. EXAM: CT HEAD WITHOUT CONTRAST TECHNIQUE: Contiguous axial images were obtained from the base of the skull through the vertex without intravenous contrast. COMPARISON:  None. FINDINGS: Brain: No evidence of acute cortical infarction, hemorrhage, extra-axial collection, ventriculomegaly, or mass effect. Small area of low attenuation in the right frontoparietal region adjacent to the right lateral ventricle concerning for a lacunar infarct. Generalized cerebral atrophy. Periventricular white matter low attenuation likely secondary to microangiopathy. Vascular: Cerebrovascular atherosclerotic calcifications are noted. Skull: Negative for fracture or focal lesion. Sinuses/Orbits: Visualized portions of the orbits are unremarkable. Visualized portions of the paranasal sinuses and mastoid air cells are unremarkable. Other: None. IMPRESSION: 1. Small area of low attenuation in the right frontoparietal region adjacent to the right lateral ventricle concerning for an acute or subacute lacunar infarct. Electronically Signed   By: Kathreen Devoid   On: 07/20/2016 15:59   Mr Brain Wo Contrast  Result Date: 07/20/2016 CLINICAL DATA:  Dysarthria and transient loss of coordination of the left hand. EXAM: MRI HEAD WITHOUT CONTRAST MRA HEAD WITHOUT CONTRAST TECHNIQUE: Multiplanar, multiecho pulse sequences of the brain and surrounding structures were obtained without intravenous contrast. Angiographic images of the head were obtained using MRA technique without contrast. COMPARISON:  Head CT 07/20/2016 FINDINGS: MRI HEAD FINDINGS Brain: There is focal diffusion restriction within the right centrum semiovale, extending along the posterior limb of the right internal capsule. No acute hemorrhage identified. The midline structures are normal. There is mild periventricular T2 hyperintensity and focal hyperintense T2 weighted signal at the site of the above-described  diffusion restriction. No mass lesion or midline shift. No hydrocephalus or extra-axial fluid collection. Vascular: No evidence of chronic microhemorrhage or amyloid angiopathy. Skull and upper cervical spine: The visualized skull base, calvarium, upper cervical spine and extracranial soft tissues are normal. Sinuses/Orbits: No fluid levels or advanced mucosal thickening. No mastoid effusion. Normal orbits. MRA HEAD FINDINGS  Intracranial internal carotid arteries: There is a focal outpouching of the para ophthalmic left internal carotid artery (series 6, image 113). Given the appearance, this is favored to be an infundibulum or other prominent vessel origin, though an aneurysm would be difficult to exclude. There is moderate narrowing of the right intracranial internal carotid artery. Anterior cerebral arteries: Normal. Middle cerebral arteries: There is mild narrowing of the proximal left MCA M1 segment. The right middle cerebral artery shows mild multifocal narrowing. No occlusion. Posterior communicating arteries: Not visualized. Posterior cerebral arteries: Normal. Basilar artery: Normal. Vertebral arteries: Left dominant. Normal. Superior cerebellar arteries: Normal. Anterior inferior cerebellar arteries: Not visualized. Posterior inferior cerebellar arteries: Normal. IMPRESSION: 1. Acute infarct of the right centrum semiovale in close proximity to and possibly involving the posterior limb of the right internal capsule. No associated hemorrhage or mass effect. 2. Mild-to-moderate multifocal narrowing of both middle cerebral arteries without occlusion. 3. Focal outpouching of the para ophthalmic segment of the left internal carotid artery, possibly in infundibulum, though an aneurysm would be difficult to exclude. CTA could be performed for further characterization. Electronically Signed   By: Ulyses Jarred M.D.   On: 07/20/2016 23:20   Mr Jodene Nam Head/brain [id number] Cm  Result Date: 07/20/2016 CLINICAL DATA:   Dysarthria and transient loss of coordination of the left hand. EXAM: MRI HEAD WITHOUT CONTRAST MRA HEAD WITHOUT CONTRAST TECHNIQUE: Multiplanar, multiecho pulse sequences of the brain and surrounding structures were obtained without intravenous contrast. Angiographic images of the head were obtained using MRA technique without contrast. COMPARISON:  Head CT 07/20/2016 FINDINGS: MRI HEAD FINDINGS Brain: There is focal diffusion restriction within the right centrum semiovale, extending along the posterior limb of the right internal capsule. No acute hemorrhage identified. The midline structures are normal. There is mild periventricular T2 hyperintensity and focal hyperintense T2 weighted signal at the site of the above-described diffusion restriction. No mass lesion or midline shift. No hydrocephalus or extra-axial fluid collection. Vascular: No evidence of chronic microhemorrhage or amyloid angiopathy. Skull and upper cervical spine: The visualized skull base, calvarium, upper cervical spine and extracranial soft tissues are normal. Sinuses/Orbits: No fluid levels or advanced mucosal thickening. No mastoid effusion. Normal orbits. MRA HEAD FINDINGS Intracranial internal carotid arteries: There is a focal outpouching of the para ophthalmic left internal carotid artery (series 6, image 113). Given the appearance, this is favored to be an infundibulum or other prominent vessel origin, though an aneurysm would be difficult to exclude. There is moderate narrowing of the right intracranial internal carotid artery. Anterior cerebral arteries: Normal. Middle cerebral arteries: There is mild narrowing of the proximal left MCA M1 segment. The right middle cerebral artery shows mild multifocal narrowing. No occlusion. Posterior communicating arteries: Not visualized. Posterior cerebral arteries: Normal. Basilar artery: Normal. Vertebral arteries: Left dominant. Normal. Superior cerebellar arteries: Normal. Anterior inferior  cerebellar arteries: Not visualized. Posterior inferior cerebellar arteries: Normal. IMPRESSION: 1. Acute infarct of the right centrum semiovale in close proximity to and possibly involving the posterior limb of the right internal capsule. No associated hemorrhage or mass effect. 2. Mild-to-moderate multifocal narrowing of both middle cerebral arteries without occlusion. 3. Focal outpouching of the para ophthalmic segment of the left internal carotid artery, possibly in infundibulum, though an aneurysm would be difficult to exclude. CTA could be performed for further characterization. Electronically Signed   By: Ulyses Jarred M.D.   On: 07/20/2016 23:20    Micro Results     No results found for this or any  previous visit (from the past 240 hour(s)).     Today   Subjective:   Jaleesha Buckel today has no headache,no chest abdominal pain,no new weakness tingling or numbness, feels much better wants to go home today.   Objective:   Blood pressure (!) 154/58, pulse 76, temperature 98.6 F (37 C), temperature source Oral, resp. rate 19, height 5\' 3"  (1.6 m), weight 58.1 kg (128 lb 1.6 oz), SpO2 99 %.   Intake/Output Summary (Last 24 hours) at 07/22/16 1510 Last data filed at 07/21/16 1815  Gross per 24 hour  Intake              120 ml  Output                0 ml  Net              120 ml    Exam Awake Alert, Oriented x 3, No new F.N deficits, Normal affect [place].AT,PERRAL Supple Neck,No JVD, No cervical lymphadenopathy appriciated.  Symmetrical Chest wall movement, Good air movement bilaterally, CTAB RRR,No Gallops,Rubs or new Murmurs, No Parasternal Heave +ve B.Sounds, Abd Soft, Non tender, No organomegaly appriciated, No rebound -guarding or rigidity. No Cyanosis, Clubbing or edema, No new Rash or bruise  Data Review   CBC w Diff: Lab Results  Component Value Date   WBC 6.1 07/22/2016   HGB 12.3 07/22/2016   HCT 37.7 07/22/2016   PLT 196 07/22/2016   LYMPHOPCT 17  07/20/2016   MONOPCT 8 07/20/2016   EOSPCT 6 07/20/2016   BASOPCT 0 07/20/2016    CMP: Lab Results  Component Value Date   NA 139 07/22/2016   K 3.9 07/22/2016   CL 107 07/22/2016   CO2 25 07/22/2016   BUN 12 07/22/2016   CREATININE 0.81 07/22/2016   PROT 6.8 07/20/2016   ALBUMIN 4.2 07/20/2016   BILITOT 0.8 07/20/2016   ALKPHOS 53 07/20/2016   AST 18 07/20/2016   ALT 11 (L) 07/20/2016  .   Total Time in preparing paper work, data evaluation and todays exam - 35 minutes  Deana Krock M.D on 07/22/2016 at 3:10 PM  [facility]   Office  [phone]

## 2016-07-22 NOTE — Progress Notes (Signed)
  Echocardiogram 2D Echocardiogram has been performed.  Diamond Nickel 07/22/2016, 11:49 AM

## 2016-07-23 ENCOUNTER — Encounter (HOSPITAL_COMMUNITY): Payer: Self-pay | Admitting: Emergency Medicine

## 2016-07-23 ENCOUNTER — Emergency Department (HOSPITAL_COMMUNITY): Payer: Medicare Other

## 2016-07-23 ENCOUNTER — Emergency Department (HOSPITAL_COMMUNITY)
Admission: EM | Admit: 2016-07-23 | Discharge: 2016-07-23 | Disposition: A | Discharge status: Home / Self Care | Payer: Medicare Other | Attending: Emergency Medicine | Admitting: Emergency Medicine

## 2016-07-23 DIAGNOSIS — Z79899 Other long term (current) drug therapy: Secondary | ICD-10-CM | POA: Insufficient documentation

## 2016-07-23 DIAGNOSIS — I679 Cerebrovascular disease, unspecified: Secondary | ICD-10-CM

## 2016-07-23 DIAGNOSIS — R29810 Facial weakness: Secondary | ICD-10-CM | POA: Diagnosis present

## 2016-07-23 DIAGNOSIS — Z8673 Personal history of transient ischemic attack (TIA), and cerebral infarction without residual deficits: Secondary | ICD-10-CM | POA: Insufficient documentation

## 2016-07-23 DIAGNOSIS — G8194 Hemiplegia, unspecified affecting left nondominant side: Secondary | ICD-10-CM | POA: Diagnosis not present

## 2016-07-23 DIAGNOSIS — Z7984 Long term (current) use of oral hypoglycemic drugs: Secondary | ICD-10-CM | POA: Insufficient documentation

## 2016-07-23 DIAGNOSIS — E119 Type 2 diabetes mellitus without complications: Secondary | ICD-10-CM | POA: Diagnosis not present

## 2016-07-23 DIAGNOSIS — R791 Abnormal coagulation profile: Secondary | ICD-10-CM | POA: Insufficient documentation

## 2016-07-23 DIAGNOSIS — R471 Dysarthria and anarthria: Secondary | ICD-10-CM | POA: Diagnosis not present

## 2016-07-23 DIAGNOSIS — I10 Essential (primary) hypertension: Secondary | ICD-10-CM | POA: Insufficient documentation

## 2016-07-23 DIAGNOSIS — R2990 Unspecified symptoms and signs involving the nervous system: Secondary | ICD-10-CM

## 2016-07-23 LAB — DIFFERENTIAL
Basophils Absolute: 0 10*3/uL (ref 0.0–0.1)
Basophils Relative: 0 %
EOS ABS: 0.4 10*3/uL (ref 0.0–0.7)
EOS PCT: 6 %
Lymphocytes Relative: 18 %
Lymphs Abs: 1.3 10*3/uL (ref 0.7–4.0)
MONO ABS: 0.6 10*3/uL (ref 0.1–1.0)
Monocytes Relative: 9 %
NEUTROS PCT: 67 %
Neutro Abs: 4.8 10*3/uL (ref 1.7–7.7)

## 2016-07-23 LAB — RAPID URINE DRUG SCREEN, HOSP PERFORMED
AMPHETAMINES: NOT DETECTED
Barbiturates: NOT DETECTED
Benzodiazepines: NOT DETECTED
Cocaine: NOT DETECTED
Opiates: NOT DETECTED
TETRAHYDROCANNABINOL: NOT DETECTED

## 2016-07-23 LAB — I-STAT CHEM 8, ED
BUN: 25 mg/dL — AB (ref 6–20)
Calcium, Ion: 1.23 mmol/L (ref 1.15–1.40)
Chloride: 103 mmol/L (ref 101–111)
Creatinine, Ser: 1.1 mg/dL — ABNORMAL HIGH (ref 0.44–1.00)
Glucose, Bld: 172 mg/dL — ABNORMAL HIGH (ref 65–99)
HEMATOCRIT: 38 % (ref 36.0–46.0)
Hemoglobin: 12.9 g/dL (ref 12.0–15.0)
Potassium: 4.3 mmol/L (ref 3.5–5.1)
SODIUM: 140 mmol/L (ref 135–145)
TCO2: 28 mmol/L (ref 0–100)

## 2016-07-23 LAB — I-STAT TROPONIN, ED: Troponin i, poc: 0.01 ng/mL (ref 0.00–0.08)

## 2016-07-23 LAB — URINE MICROSCOPIC-ADD ON: RBC / HPF: NONE SEEN RBC/hpf (ref 0–5)

## 2016-07-23 LAB — COMPREHENSIVE METABOLIC PANEL
ALK PHOS: 57 U/L (ref 38–126)
ALT: 12 U/L — AB (ref 14–54)
AST: 19 U/L (ref 15–41)
Albumin: 4.1 g/dL (ref 3.5–5.0)
Anion gap: 8 (ref 5–15)
BILIRUBIN TOTAL: 0.5 mg/dL (ref 0.3–1.2)
BUN: 22 mg/dL — ABNORMAL HIGH (ref 6–20)
CALCIUM: 9.6 mg/dL (ref 8.9–10.3)
CO2: 26 mmol/L (ref 22–32)
CREATININE: 0.99 mg/dL (ref 0.44–1.00)
Chloride: 104 mmol/L (ref 101–111)
GFR calc non Af Amer: 54 mL/min — ABNORMAL LOW (ref >60)
Glucose, Bld: 181 mg/dL — ABNORMAL HIGH (ref 65–99)
Potassium: 4.2 mmol/L (ref 3.5–5.1)
SODIUM: 138 mmol/L (ref 135–145)
TOTAL PROTEIN: 6.8 g/dL (ref 6.5–8.1)

## 2016-07-23 LAB — CBC
HCT: 40 % (ref 36.0–46.0)
Hemoglobin: 12.9 g/dL (ref 12.0–15.0)
MCH: 29.7 pg (ref 26.0–34.0)
MCHC: 32.3 g/dL (ref 30.0–36.0)
MCV: 92 fL (ref 78.0–100.0)
PLATELETS: 233 10*3/uL (ref 150–400)
RBC: 4.35 MIL/uL (ref 3.87–5.11)
RDW: 12.5 % (ref 11.5–15.5)
WBC: 7.2 10*3/uL (ref 4.0–10.5)

## 2016-07-23 LAB — URINALYSIS, ROUTINE W REFLEX MICROSCOPIC
Glucose, UA: NEGATIVE mg/dL
Hgb urine dipstick: NEGATIVE
KETONES UR: 15 mg/dL — AB
NITRITE: NEGATIVE
Protein, ur: NEGATIVE mg/dL
SPECIFIC GRAVITY, URINE: 1.029 (ref 1.005–1.030)
pH: 5.5 (ref 5.0–8.0)

## 2016-07-23 LAB — ETHANOL

## 2016-07-23 LAB — PROTIME-INR
INR: 0.98
PROTHROMBIN TIME: 13 s (ref 11.4–15.2)

## 2016-07-23 LAB — APTT: aPTT: 30 seconds (ref 24–36)

## 2016-07-23 LAB — CBG MONITORING, ED: GLUCOSE-CAPILLARY: 187 mg/dL — AB (ref 65–99)

## 2016-07-23 NOTE — ED Notes (Signed)
PT screened at [facility]- states she was seen in ED on 9/13 for L sided facial droop and L arm weakness.  States symptoms never resolved but seemed to have gotten worse tonight around 10pm.  Pt states she tried to take a shower and was unable to grip washcloth with L hand.  Pt gripped labels at triage with L hand and states it seems like her symptoms are improving from when she was at home.

## 2016-07-23 NOTE — ED Provider Notes (Signed)
[facility] DEPT Provider Note   CSN: [id number] Arrival date & time: 07/23/16  0149  By signing my name below, I, Irene Pap, attest that this documentation has been prepared under the direction and in the presence of No att. providers found. Electronically Signed: Irene Pap, ED Scribe. 07/23/16. 10:52 AM.   History   Chief Complaint No chief complaint on file.  The history is provided by the patient. No language interpreter was used.   HPI Comments: Sharon Wilson is a 75 y.o. female with a hx of who presents to the Emergency Department complaining of left sided facial droop onset 5 hours ago. Pt was discharged from the hospital this morning following admission for a subacute stroke. She says that her symptoms improved while in the hospital. She went home and got in the shower when she began to notice eye drooping. She is now having left hand weakness. She says that during her last stroke, the weakness in the left hand was present, but symptoms had gotten better. She says that her speech was improved upon discharge, but is now experiencing some speech difficulty. She denies headache or vision changes. LAst normal 9 pm.  Past Medical History:  Diagnosis Date  . Anemia    hysterectomy  . Diabetes mellitus   . Diverticulosis   . Hyperlipidemia   . OA (osteoarthritis)   . White coat hypertension     Patient Active Problem List   Diagnosis Date Noted  . Acute ischemic stroke ([place]) 07/20/2016  . Dysarthria 07/20/2016  . Stroke (cerebrum) ([place]) 07/20/2016  . Estrogen deficiency 08/06/2014  . Medicare annual wellness visit, initial 08/06/2014  . Statin intolerance 08/06/2014  . Diabetes mellitus type 2, uncontrolled, without complications ([place]) [id number]  . Chest discomfort 03/17/2014  . Body aches 03/17/2014  . Fatigue 03/17/2014  . Abnormal AST  03/17/2014  . HTN, white coat 12/13/2013  . Visit for preventive health examination 02/08/2013  . White coat  hypertension  effect. 06/09/2012  . Allergic reaction caused by a drug 05/18/2012  . Allergy to angiotensin-converting enzyme (ACE) inhibitors 05/18/2012  . Stressful life event affecting family 02/29/2012  . ADVERSE REACTION TO MEDICATION 11/26/2010  . SKIN LESION, UNCERTAIN SIGNIFICANCE 10/25/2010  . SINUS TACHYCARDIA 08/01/2007  . HLD (hyperlipidemia) 05/01/2007  . DIVERTICULOSIS, COLON 05/01/2007  . COLONIC POLYPS, HX OF 05/01/2007  . Non-insulin dependent type 2 diabetes mellitus ([place]) 03/14/2007  . Essential hypertension 03/14/2007  . OSTEOARTHRITIS 03/14/2007    Past Surgical History:  Procedure Laterality Date  . ABDOMINAL HYSTERECTOMY     fibroid bleeding  . carotid dopplers  11/11   [facility]  . CATARACT EXTRACTION  2011    OB History    Gravida Para Term Preterm AB Living   4 4           SAB TAB Ectopic Multiple Live Births                   Home Medications    Prior to Admission medications   Medication Sig Start Date End Date Taking? Authorizing Provider  Calcium Carbonate-Vitamin D (OSCAL 500/200 D-3 PO) Take 1 tablet by mouth 2 (two) times daily.     Historical Provider, MD  carvedilol (COREG) 12.5 MG tablet Take 1 tablet (12.5 mg total) by mouth 2 (two) times daily with a meal. 10/07/15   [name], MD  clopidogrel (PLAVIX) 75 MG tablet Take 1 tablet (75 mg total) by mouth daily. 07/22/16  Albertine Alec, MD  metFORMIN (GLUCOPHAGE) 500 MG tablet TAKE ONE TABLET BY MOUTH IN THE MORNING AND TWO TABS IN THE EVENING 07/08/16   [name], MD  pravastatin (PRAVACHOL) 40 MG tablet Take 1 tablet (40 mg total) by mouth daily at 6 PM. 07/22/16   Albertine Lacrystal, MD    Family History Family History  Problem Relation Age of Onset  . Cancer Mother     breast    Social History Social History  Substance Use Topics  . Smoking status: Never Smoker  . Smokeless tobacco: Never Used  . Alcohol use No     Allergies   Ace inhibitors;  Lovastatin; and Norvasc [amlodipine besylate]   Review of Systems Review of Systems 10 Systems reviewed and all are negative for acute change except as noted in the HPI.   Physical Exam Updated Vital Signs BP 158/68 (BP Location: Right Arm)   Pulse 75   Temp 98.2 F (36.8 C) (Oral)   Resp 18   SpO2 99%   Physical Exam  Constitutional: She appears well-developed and well-nourished. No distress.  HENT:  Head: Normocephalic and atraumatic.  Mouth/Throat: Oropharynx is clear and moist. No oropharyngeal exudate.  Eyes: Conjunctivae and EOM are normal. Pupils are equal, round, and reactive to light. Right eye exhibits no discharge. Left eye exhibits no discharge. No scleral icterus.  Neck: Normal range of motion. Neck supple. No JVD present. No thyromegaly present.  Cardiovascular: Normal rate, regular rhythm, normal heart sounds and intact distal pulses.  Exam reveals no gallop and no friction rub.   No murmur heard. Pulmonary/Chest: Effort normal and breath sounds normal. No respiratory distress. She has no wheezes. She has no rales.  Abdominal: Soft. Bowel sounds are normal. She exhibits no distension and no mass. There is no tenderness.  Musculoskeletal: Normal range of motion. She exhibits no edema or tenderness.  Lymphadenopathy:    She has no cervical adenopathy.  Neurological: She is alert.  Cranial nerves 2-12 intact except for mildly slurred speech and left sided facial droop; upper extremity strength appears normal; LE strength is normal, 4/5. Upper and lower extremity sensation normal; cerebellar exam finger to nose reveals no dysmetria.  NIHSS - 3. 2 for facial paralysis and 1 for dysarthria  Skin: Skin is warm and dry. No rash noted. No erythema.  Psychiatric: She has a normal mood and affect. Her behavior is normal.  Nursing note and vitals reviewed.    ED Treatments / Results  DIAGNOSTIC STUDIES: Oxygen Saturation is 99% on RA, normal by my interpretation.     COORDINATION OF CARE: 3:58 AM-Discussed treatment plan with pt at bedside and pt agreed to plan.    Labs (all labs ordered are listed, but only abnormal results are displayed) Labs Reviewed  COMPREHENSIVE METABOLIC PANEL - Abnormal; Notable for the following:       Result Value   Glucose, Bld 181 (*)    BUN 22 (*)    ALT 12 (*)    GFR calc non Af Amer 54 (*)    All other components within normal limits  URINALYSIS, ROUTINE W REFLEX MICROSCOPIC (NOT AT [facility]) - Abnormal; Notable for the following:    APPearance CLOUDY (*)    Bilirubin Urine SMALL (*)    Ketones, ur 15 (*)    Leukocytes, UA MODERATE (*)    All other components within normal limits  URINE MICROSCOPIC-ADD ON - Abnormal; Notable for the following:    Squamous Epithelial /  LPF 0-5 (*)    Bacteria, UA RARE (*)    All other components within normal limits  CBG MONITORING, ED - Abnormal; Notable for the following:    Glucose-Capillary 187 (*)    All other components within normal limits  I-STAT CHEM 8, ED - Abnormal; Notable for the following:    BUN 25 (*)    Creatinine, Ser 1.10 (*)    Glucose, Bld 172 (*)    All other components within normal limits  URINE CULTURE  ETHANOL  PROTIME-INR  APTT  CBC  DIFFERENTIAL  URINE RAPID DRUG SCREEN, HOSP PERFORMED  I-STAT TROPOININ, ED    EKG  EKG Interpretation  Date/Time:  Saturday July 23 2016 01:56:49 EDT Ventricular Rate:  81 PR Interval:  186 QRS Duration: 92 QT Interval:  388 QTC Calculation: 450 R Axis:   -45 Text Interpretation:  Normal sinus rhythm Left axis deviation Nonspecific ST abnormality Abnormal ECG No acute changes Confirmed by Kathrynn Humble, MD, [facility] Comp [phone]) on 07/23/2016 2:26:59 AM       Radiology Ct Head Code Stroke W/o Cm  Result Date: 07/23/2016 CLINICAL DATA:  Code stroke. Recent stroke, tonight at 2200 hours noticed mouth drooping. EXAM: CT HEAD WITHOUT CONTRAST TECHNIQUE: Contiguous axial images were obtained from the base of the  skull through the vertex without intravenous contrast. COMPARISON:  07/20/2016 CT head and 07/20/2016 MR brain FINDINGS: The lucency within the right lentiform nucleus extending into corona radiata and caudate body is increased in conspicuity consistent with a evolving infarct. No new infarct, intracranial hemorrhage, focal mass effect, or hydrocephalus is identified. No hyperdense vessel. Stable lucency in the left lentiform nucleus compatible with chronic lacunar infarct. Stable mild parenchymal volume loss and chronic microvascular ischemic changes. The calvarium is unremarkable. Paranasal sinuses and mastoid air cells are clear. Right intra-ocular lens replacement. Extensive calcific atherosclerosis of the internal carotid arteries. ASPECTS [facility] Stroke Program Early CT Score) - Ganglionic level infarction (caudate, lentiform nuclei, internal capsule, insula, M1-M3 cortex): 6 - Supraganglionic infarction (M4-M6 cortex): 3 Total score (0-10 with 10 being normal): 9 IMPRESSION: 1. Evolving infarct within the right lentiform nucleus extending into corona radiata/caudate body is unchanged in size in comparison with diffusion abnormality on MRI. No new infarct or hemorrhage is identified. 2. ASPECTS is 9 These results were called by telephone at the time of interpretation on 07/23/2016 at 2:30 am to Dr. Shon Hale, who verbally acknowledged these results. Electronically Signed   By: Kristine Garbe M.D.   On: 07/23/2016 02:29    Procedures Procedures (including critical care time)  Medications Ordered in ED Medications - No data to display   Initial Impression / Assessment and Plan / ED Course  I have reviewed the triage vital signs and the nursing notes.  Pertinent labs & imaging results that were available during my care of the patient were reviewed by me and considered in my medical decision making (see chart for details).  Clinical Course  Comment By Time  Pt feels like her symptoms have  improved in her hands. Neuro plan discussed and pt ok with the plan. Will d.c. Strict ER return precautions have been discussed, and patient is agreeing with the plan and is comfortable with the workup done and the recommendations from the ER.  Varney Biles, MD 09/16 [phone]   DDx includes:  Stroke - ischemic vs. hemorrhagic TIA Electrolyte abnormality Neuropathy Muscular disease  Pt with hx of stroke, htn, dm comes in with L sided hand weakness, slurred speech  and drooping. NIHSS -3. Pt was just discharged from the hospital. She reports that the upper extremity weakness and slurred speech were similar to her stroke like symptoms that she presented with, but the the symptoms had improved.  Pt is outside of TPA window and is not a candidate for TPA. Stroke team consulted and they have asked that pt get repeat basic eval and if workup is neg, she can be discharged.   Final Clinical Impressions(s) / ED Diagnoses   Final diagnoses:  Stroke-like symptom    New Prescriptions Discharge Medication List as of 07/23/2016  5:54 AM       Varney Biles, MD 07/23/16 1052

## 2016-07-23 NOTE — Progress Notes (Signed)
Code stroke called on 75 y.o female LSN 10 pm tonight. While in shower tonight she developed acute left side weakness and facial droop. Pt  Brought to [facility] by her husband. Code stroke called in Triage, Pt taken to CT scan STAT. CT reviewed as negative per Neurologist Dr. Shon Hale. NIHSS completed yielding 3 for left facial droop and slurred speech. Pertinent history includes recent admit for CVA d/c 9/15 with residual left side weakness and slurred speech per d/c note, hyperlipidemia, DM. Due to mild consistent deficits Pt not a TPA candidate. For Hospitalist consult tonight.

## 2016-07-23 NOTE — ED Notes (Signed)
Escorted to [facility] by triage RN.

## 2016-07-23 NOTE — Consult Note (Signed)
Neurology Consult Note  Reason for Consultation: CODE STROKE  Requesting provider: Varney Biles, MD  CC: Increased weakness on the left side  HPI: This is a 70-yo RH woman who presents to the ED complaining of left-sided weakness and slurred speech. She was just discharged from [facility] at about 1600 on 07/22/16 where she was admitted for a stroke with similar symptoms. I have reviewed the chart at length.   The patient and her husband report that she initially noticed some weakness and clumsiness in the left hand in the early morning hours of 07/18/16. However, these symptoms apparently fluctuated and seemed to improve. However, they did not fully resolve and after persisting for 2 days who presented to the emergency department on 07/20/16. She was noted to have slurred speech with left-sided weakness and a left facial droop. She was admitted for evaluation of possible stroke. Workup included MRI scan of the brain which revealed acute infarction in the right centrum semiovale ovale possibly involving the posterior limb of the right internal capsule. MRA of the head showed mild to moderate multifocal narrowing of both middle cerebral arteries but no occlusion. Carotid Dopplers demonstrated 40-59 percent stenosis of the left internal carotid artery with no hemodynamically significant stenosis on the right. Transthoracic echocardiogram showed ejection fraction 50-55 percent with akinesis of the basal-mid inferolateral portion of the myocardium; grade 1 diastolic dysfunction; mild dilation of the left atrium without intracardiac source of emboli. On the day of discharge, she was noted to have persistent left facial droop and mild dysarthria. She was discharged on clopidogrel 75 mg daily and Pravachol 40 mg daily.  They report that they return home and ran a few errands. Last night she was taking a hot shower when she noticed that she was having difficulty controlling her left hand to hold and  manipulate the washcloth. She also felt as if her left facial droop and her speech were worse again. She states that they feel very similar to the symptoms she had when she presented several days ago. The only difference that she can appreciate was that her left eye seemed to be slightly droopy as well. Symptoms have once again improved, though she still does not feel that they are back to the same level of improvement that she experienced at the time of discharge. She did not report any real new neurologic deficits.   Last known well: About 2200 NIHSS score: 3 tPA given?: No, symptoms consistent with recrudescence of symptoms from a stroke that occurred a few days ago for which she was just discharged from the hospital on 07/22/16  PMH:  1. 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 Left facial droop, secondary to stroke 3. Dysarthria, secondary to stroke  4. Diabetes 5. Hyperlipidemia 6. Diverticulosis 7. Anemia 8. Osteoarthritis  PSH:  Past Surgical History:  Procedure Laterality Date  . ABDOMINAL HYSTERECTOMY     fibroid bleeding  . carotid dopplers  11/11   per [facility]  . CATARACT EXTRACTION  2011    Family history: Family History  Problem Relation Age of Onset  . Cancer Mother     breast    Social history:  Social History   Social History  . Marital status: Married    Spouse name: N/A  . Number of children: N/A  . Years of education: N/A   Occupational History  . Not on file.   Social History Main Topics  . Smoking status: Never Smoker  . Smokeless  tobacco: Never Used  . Alcohol use No  . Drug use: No  . Sexual activity: Not on file   Other Topics Concern  . Not on file   Social History Narrative   Married husband no retired   hh of 2   No pets    Current outpatient meds: No outpatient prescriptions have been marked as taking for the 07/23/16 encounter [facility] Encounter).    Current inpatient meds:  No current  facility-administered medications for this encounter.    Current Outpatient Prescriptions  Medication Sig Dispense Refill  . Calcium Carbonate-Vitamin D (OSCAL 500/200 D-3 PO) Take 1 tablet by mouth 2 (two) times daily.     . carvedilol (COREG) 12.5 MG tablet Take 1 tablet (12.5 mg total) by mouth 2 (two) times daily with a meal. 180 tablet 3  . clopidogrel (PLAVIX) 75 MG tablet Take 1 tablet (75 mg total) by mouth daily. 30 tablet 0  . metFORMIN (GLUCOPHAGE) 500 MG tablet TAKE ONE TABLET BY MOUTH IN THE MORNING AND TWO TABS IN THE EVENING 360 tablet 0  . pravastatin (PRAVACHOL) 40 MG tablet Take 1 tablet (40 mg total) by mouth daily at 6 PM. 30 tablet 0    Allergies: Allergies  Allergen Reactions  . Ace Inhibitors Hives, Swelling and Cough  . Lovastatin Other (See Comments)    Leg cramps  Can take pravastatin  . Norvasc [Amlodipine Besylate]     Light headed and vertigo like  Also may have had hives.     ROS: As per HPI. A full 14-point review of systems was performed and is otherwise unremarkable.   PE:  BP 173/84   Pulse 83   Resp 16   SpO2 99%   General: WDWN, no acute distress. AAO x4. Speech notable for slight dysarthria. No aphasia. Follows commands briskly. Affect is bright with congruent mood. Comportment is normal.  HEENT: Normocephalic. Neck supple without LAD. MMM, OP clear. Dentition good. Sclerae anicteric. No conjunctival injection.  CV: Regular, no murmur. Carotid pulses full and symmetric, no bruits. Distal pulses 2+ and symmetric.  Lungs: CTAB.  Abdomen: Soft, non-distended, non-tender. Bowel sounds present x4.  Extremities: No C/C/E. Neuro:  CN: Pupils are equal and round. They are symmetrically reactive from 3-->2 mm. EOMI without nystagmus. No reported diplopia. Facial sensation is intact to light touch. Face is notable for a mild left central VII pattern of weakness. Hearing is intact to conversational voice. Palate elevates symmetrically and uvula is  midline. Voice is normal in tone, pitch and quality. Bilateral SCM and trapezii are 5/5. Tongue is midline with normal bulk and mobility.  Motor: Normal bulk, tone, and strength. No tremor or other abnormal movements. No drift.  Sensation: Intact to light touch, pinprick, vibration, and joint position.  DTRs: 2+, brisker on the left than the right. Toes downgoing bilaterally. No pathologic reflexes.  Coordination: Finger-to-nose and heel-to-shin are without dysmetria. Finger taps are normal in amplitude and speed, no decrement.    Labs:  Lab Results  Component Value Date   WBC 7.2 07/23/2016   HGB 12.9 07/23/2016   HCT 38.0 07/23/2016   PLT 233 07/23/2016   GLUCOSE 172 (H) 07/23/2016   CHOL 238 (H) 07/21/2016   TRIG 294 (H) 07/21/2016   HDL 32 (L) 07/21/2016   LDLDIRECT 136.0 05/05/2016   LDLCALC 147 (H) 07/21/2016   ALT 12 (L) 07/23/2016   AST 19 07/23/2016   NA 140 07/23/2016   K 4.3 07/23/2016   CL  103 07/23/2016   CREATININE 1.10 (H) 07/23/2016   BUN 25 (H) 07/23/2016   CO2 26 07/23/2016   TSH 4.13 09/29/2015   INR 0.98 07/23/2016   HGBA1C 7.3 (H) 07/21/2016   MICROALBUR 2.7 (H) 09/29/2015    Imaging:  I have personally and independently reviewed CT scan of the head without contrast from tonight. This shows a focal area of hypodensity in the right centrum semiovale, consistent with subacute ischemic infarction. There is been no significant change when compared to a recent MRI scan from 07/20/16.   Assessment and Plan:  1. Left hemiparesis: Symptoms as reported her most suggestive of recrudescence of deficits from her recent stroke. She tells me that they're essentially identical to the symptoms she had when she presented a few days ago. It is possible that these symptoms were exacerbated by heat from her hot shower. Additional considerations would include urinary tract infection and fatigue. Recommend checking UA and treating for UTI if abnormal. Recommend that she avoid  excessive heat. Recommend that she make every effort to get regular sleep and optimize rest during her recovery. While slight extension of her recent infarction is possible, this would not change management in any significant way given that she has already experienced improvement in symptoms once again.  2. Dysarthria: Again, this is most suggestive of recrudescence of prior deficits as noted above. Recommendations as noted.  3. Cerebrovascular disease: She just had a recent stroke earlier this week. Full workup was performed and did not reveal any embolic source. There is no need to repeat any of these tests at this time. She had been taking aspirin but this was changed to Plavix during her hospitalization and Plavix should be continued. Continue statin. Ensure tight control of diabetes, lipids, and blood pressure long term. She has no new symptoms to suggest a new area of infarct. As noted above, there could be some potential slight extension of her known stroke but this would not change management in any significant way at this point in time.  This was discussed with the patient and her husband. They are in agreement with the plan as noted. They were given the opportunity to ask any questions and these were addressed to their satisfaction.  I discussed my findings with Dr. Kathrynn Humble. I have no further recommendations at this time. Please feel free to call if any new questions or concerns arise.

## 2016-07-23 NOTE — ED Triage Notes (Signed)
Pt arrives with L sided facial droop, slight slurred speech and L sided weakness onset at 2200 last night while taking a shower. EDP Nanavati at bedside at this time.

## 2016-07-24 LAB — URINE CULTURE

## 2016-07-28 NOTE — Progress Notes (Addendum)
Pre visit review using our clinic review tool, if applicable. No additional management support is needed unless otherwise documented below in the visit note.  Chief Complaint  Patient presents with  . Hospitalization Follow-up    HPI: Sharon Wilson 75 y.o.   [facility] hosp  for   CVA   Here with husband .Marland KitchenMarland KitchenAnd initially presented with waxing and waning dysarthria and fine motor changes in the left hand. She was evaluated in hospital changed from aspirin to Plavix placed on pravastatin although she has a history of leg cramps on it and discharged to follow-up with neurology. It was also noted she had some inferior hypokinesis on her echo with normal ejection fraction and was placed to have a visit follow-up with cardiology. After discharge from the hospital she had increasing symptoms with left facial droop and left hand fine motor difficulty. Back to the emergency room had a neurologic consult no other action taken. Has an appointment with neurology in the middle of October and has yet to have a cardiology appointment much to the dismay of patient's husband who is trying to contact them and get this done. Patient states that her facial drooping and dysarthria is better in the morning gets worse as the day goes on and she is tired. She's had some nocturnal leg cramps but not every night. Her blood pressure readings at home are mostly in the 130s although in the hospital was high all the time.  Sometimes it is hard to grip and hold things in her left hand and have strength in her fingers. Otherwise no leg weakness falling or dizziness.   Below is hospital discharge summary  .  Admission date:  07/20/2016  Admitting Physician  Vianne Bulls, MD  Discharge Date:  07/22/2016   Primary MD  Lottie Dawson, MD  Recommendations for primary care physician for things to follow:  - Please check CBC, BMP during next visit, check lipid panel in 2 month, patient will need further adjustment of  her antihypertensive medication if needed, be discharged on home regimen Coreg, please reassess your next visit on the office and increase if needed. - Patient to follow with neurology in 8 weeks from discharge, neurology will call with a follow-up appointment  - Patient will need outpatient follow-up with cardiology regarding abnormal findings, cardiology will call Monday with a follow-up appointment.   Admission Diagnosis  Stroke (cerebrum) (HCC) [I63.9] Cerebral infarction due to unspecified mechanism [I63.9]   Discharge Diagnosis  Stroke (cerebrum) ([place]) [I63.9] Cerebral infarction due to unspecified mechanism [I63.9]    Principal Problem:   Acute ischemic stroke [facility]) Active Problems:   Non-insulin dependent type 2 diabetes mellitus ([place])   HLD (hyperlipidemia)   Essential hypertension   Statin intolerance   Dysarthria   Ischemic stroke [facility])          Past Medical History:  Diagnosis Date  . Anemia    hysterectomy  . Diabetes mellitus   . Diverticulosis   . Hyperlipidemia   . OA (osteoarthritis)   . White coat hypertension          Past Surgical History:  Procedure Laterality Date  . ABDOMINAL HYSTERECTOMY     fibroid bleeding  . carotid dopplers  11/11   per [facility]  . CATARACT EXTRACTION  2011       History of present illness and  Hospital Course:     Kindly see H&P for history of present illness and admission details, please review complete Labs,  Consult reports and Test reports for all details in brief  HPI  from the history and physical done on the day of admission 07/20/2016  HPI: Sharon Keach Robinsonis a 75 y.o.femalewith medical history significant for type 2 diabetes mellitus, hypertension, and hyperlipidemia who presents to the emergency department for evaluation of dysarthria and transient loss of coordination involving the left hand. The patient reports that she had been enjoying good health until early in the  morning of 07/17/2016, when she was trying to floss her teeth, but was unable to due to a loss of coordination in her left hand. There was concomitant development of dysarthria, but there is been no headache, change in vision or hearing, focal numbness or weakness, difficulty swallowing, or cough. Patient denies any chest pain or palpitations associated with this, and has never experienced similar symptoms previously. She reports that the left hand symptoms resolved spontaneously over the course of a couple minutes but the dysarthria has persisted. She feels that sometimes her speech is more affected than others. She denies any difficulty with word finding, but reports problems with pronunciation, worse when she is speaking more rapidly. Patient saw her primary care physician for evaluation of these complaints earlier in the day, was suspected of having a stroke, and was directed to the emergency department for further evaluation.  ED Course:Upon arrival to the ED, patient is found to be afebrile, saturating well on room air, hypertensive to 220/90, and with vitals otherwise stable. EKG demonstrates normal sinus rhythm with left axis deviation, INR is within the normal limits, and troponin is undetectable. Basic lab work, including CMP and CBC with differential are essentially normal. Noncontrast head CT reveals a small area of low attenuation in the right frontoparietal region concerning for acute or subacute lacunar infarct. Patient was treated with 325 mg aspirin and 10 mg IV hydralazine in the emergency department. Neurology has been consulted by the ED physician. Patient has remained stable in the ED and will be observed on the telemetry unit for ongoing evaluation and management of dysarthria suspected secondary to subacute ischemic CVA.   Hospital Course  75 y.o.femalewith medical history significant for type 2 diabetes mellitus, hypertension, and hyperlipidemia who presents to the emergency  department for evaluation of dysarthria and transient loss of coordination involving the left hand.Workup significant for ischemic CVA.  Ischemic CVA  - Non-contrast head CT with likely subacute lacunar infarct in right frontoparietal region  - MRI brain: Acute infarct of the right centrum semiovale in close proximity to and possibly involving the posterior limb of the right internal capsule - MRA head : Mild-to-moderate multifocal narrowing of both middle cerebral arteries without occlusion. Focal outpouching of the para ophthalmic segment of the left internal carotid artery, possibly in infundibulum, though an aneurysm would be difficult to exclude. CTA could be performed for further characterization - Aspirin 81 mg oral daily, currently on ASA 325 mg  - 2-D echo is a preserved EF 99991111, grade 1 diastolic dysfunction, and I continues his in basal mid inferior lateral myocardium - Carotid Doppler:1-39% right ICA stenosis, highest end of scale. There is 40-59% left ICA stenosis, mid range of scale. Vertebral artery flow is antegrade. Bilateral ECA velocities are elevated. - Evidence of arrhythmias on telemetry - LDL is 147, started on pravastatin - Seen by PT, OT, SLP , no follow-up needed on discharge - Neurology consult greatly appreciated, felt secondary to  small vessel disease, mentation is to start Plavix 75 mg oral daily, and stop  aspirin.  Abnormal finding on echocardiogram - Patient with evidence of akenises of basal mid inferior lateral myocardium, discussed with cardiology, they will arrange for outpatient follow-up  Type II DM  - A1c 7.1% in June 2017  - Managed with metformin only at home,  hemoglobin A1c is 7.4, no medication change on discharge  Hypertension  - Managed with low-dose Coreg at home, blood pressure slightly on the higher side in hospital stay , she did report blood pressure is controlled at baseline, will discharge on home medication Coreg, Zetia cyst by  PCP and adjust as needed .   Hyperlipidemia  - Pt reports a statin-intolerance , but was able to tolerate Pravachol in the past, so  started on 40 mg Pravachol. - Lipid panel from June '17 with LDL 136 and HDL 35  - LDL goal will now be <70 or <100 depending on society guidelines; PCSK9 antibody, or ezetamibe could be considered  - Dietary and lifestyle measures have not yet been attempted     Discharge Condition:  Stable discussed with husband   Follow UP     Follow-up Information    Lottie Dawson, MD. Schedule an appointment as soon as possible for a visit in 1 week(s).   Specialties:  Internal Medicine, Pediatrics Contact information: [place] [address] [phone]    ROS: See pertinent positives and negatives per HPI.  Past Medical History:  Diagnosis Date  . Anemia    hysterectomy  . Diabetes mellitus   . Diverticulosis   . Hyperlipidemia   . OA (osteoarthritis)   . White coat hypertension     Family History  Problem Relation Age of Onset  . Cancer Mother     breast    Social History   Social History  . Marital status: Married    Spouse name: N/A  . Number of children: N/A  . Years of education: N/A   Social History Main Topics  . Smoking status: Never Smoker  . Smokeless tobacco: Never Used  . Alcohol use No  . Drug use: No  . Sexual activity: Not Asked   Other Topics Concern  . None   Social History Narrative   Married husband no retired   hh of 2   No pets    Outpatient Medications Prior to Visit  Medication Sig Dispense Refill  . Calcium Carbonate-Vitamin D (OSCAL 500/200 D-3 PO) Take 1 tablet by mouth 2 (two) times daily.     . clopidogrel (PLAVIX) 75 MG tablet Take 1 tablet (75 mg total) by mouth daily. 30 tablet 0  . metFORMIN (GLUCOPHAGE) 500 MG tablet TAKE ONE TABLET BY MOUTH IN THE MORNING AND TWO TABS IN THE EVENING 360 tablet 0  . pravastatin (PRAVACHOL) 40 MG tablet Take 1 tablet  (40 mg total) by mouth daily at 6 PM. 30 tablet 0  . carvedilol (COREG) 12.5 MG tablet Take 1 tablet (12.5 mg total) by mouth 2 (two) times daily with a meal. 180 tablet 3   No facility-administered medications prior to visit.      EXAM:  BP (!) 164/62   Pulse 67   Temp 97.7 F (36.5 C) (Oral)   Ht 5\' 3"  (1.6 m)   Wt 124 lb 9.6 oz (56.5 kg)   SpO2 97%   BMI 22.07 kg/m   Body mass index is 22.07 kg/m.  GENERAL: vitals reviewed and listed above, alert, oriented, appears well hydrated and in no acute distress has obvious left  facial droop be mild and some dysarthria. HEENT: atraumatic, conjunctiva  clear, no obvious abnormalities on inspection of external nose and ears OP : no lesion edema or exudate  NECK: no obvious masses on inspection palpation  LUNGS: clear to auscultation bilaterally, no wheezes, rales or rhonchi, good air movement CV: HRRR, no clubbing cyanosis or  peripheral edema nl cap refill  MS: moves all extremities without noticeable focal  abnormality  Neuro   As above facial droop and dysarthria. Last left hand grip slightly down from right 4.5 per 5. Also interosseous muscles. No drift. Gait is within normal limits. PSYCH: pleasant and cooperative, no obvious depression or anxiety BP Readings from Last 3 Encounters:  07/29/16 (!) 164/62  07/23/16 158/68  07/22/16 (!) 154/58   Lab Results  Component Value Date   WBC 7.2 07/23/2016   HGB 12.9 07/23/2016   HCT 38.0 07/23/2016   PLT 233 07/23/2016   GLUCOSE 172 (H) 07/23/2016   CHOL 238 (H) 07/21/2016   TRIG 294 (H) 07/21/2016   HDL 32 (L) 07/21/2016   LDLDIRECT 136.0 05/05/2016   LDLCALC 147 (H) 07/21/2016   ALT 12 (L) 07/23/2016   AST 19 07/23/2016   NA 140 07/23/2016   K 4.3 07/23/2016   CL 103 07/23/2016   CREATININE 1.10 (H) 07/23/2016   BUN 25 (H) 07/23/2016   CO2 26 07/23/2016   TSH 4.13 09/29/2015   INR 0.98 07/23/2016   HGBA1C 7.3 (H) 07/21/2016   MICROALBUR 2.7 (H) 09/29/2015      ASSESSMENT AND PLAN:  Discussed the following assessment and plan:  Late effects of CVA (cerebrovascular accident) - waxing and waning got worse when left the hosp and has facila droop left hand sx and dysarthria  Essential hypertension - w wc effect reviewed goals 130s at home but up sme inc to 1.5  carved bid as planned for control  - Plan: CBC with Differential/Platelet, Basic metabolic panel  Non-insulin dependent type 2 diabetes mellitus (HCC)  White coat hypertension  effect.  Acute ischemic stroke ([place]) - Plan: CBC with Differential/Platelet, Basic metabolic panel, Ambulatory referral to [facility] discharge follow-up  Dysarthria  Abnormal echocardiogram - reported to pat /husband and rational for fu   Left hand weakness Patient symptoms progressed after discharge to the hospital because of her symptoms home health for speech therapy OT and poss  PT is more appropriate this time. Lab work today as advised by hospitalist. She may be having some side effects of the pravastatin but minor and advise continue at this time. We'll try to help facilitate getting a cardiology appointment as an area of concern . Keep follow-up appointment with me in about 6-8 weeks. Earlier if needed. Discussed blood pressure goals. Patient her husband can certainly contact us if prescriptions need to be written differently because of cost improvement or given pharmacy. They've a high deductible on  pharmacy plan. Total visit 93mins > 50% spent counseling and coordinating care as indicated in above note and in instructions to patient .  -Patient advised to return or notify health care team  if symptoms worsen ,persist or new concerns arise.  Patient Instructions  Increase the carvedilol to 1.5  ...  Twice a day but if gets ..too low . Will notify you  of labs when available.  Will arrange a home health referral for  Speech PT OT .  As we discussed . Will send a message to cards about   Getting your appt with  them.  Also will send  Note to neurology .  I agree  Transmission of information could be better on discharge from the hospital.  Would  ROV   with me     About   BP  And fu .        Standley Brooking. Haedyn Ancrum M.D.

## 2016-07-29 ENCOUNTER — Encounter: Payer: Self-pay | Admitting: Internal Medicine

## 2016-07-29 ENCOUNTER — Ambulatory Visit (INDEPENDENT_AMBULATORY_CARE_PROVIDER_SITE_OTHER): Payer: Medicare Other | Admitting: Internal Medicine

## 2016-07-29 VITALS — BP 164/62 | HR 67 | Temp 97.7°F | Ht 63.0 in | Wt 124.6 lb

## 2016-07-29 DIAGNOSIS — I6990 Unspecified sequelae of unspecified cerebrovascular disease: Secondary | ICD-10-CM | POA: Diagnosis not present

## 2016-07-29 DIAGNOSIS — R931 Abnormal findings on diagnostic imaging of heart and coronary circulation: Secondary | ICD-10-CM

## 2016-07-29 DIAGNOSIS — M6289 Other specified disorders of muscle: Secondary | ICD-10-CM

## 2016-07-29 DIAGNOSIS — R030 Elevated blood-pressure reading, without diagnosis of hypertension: Secondary | ICD-10-CM

## 2016-07-29 DIAGNOSIS — I639 Cerebral infarction, unspecified: Secondary | ICD-10-CM | POA: Diagnosis not present

## 2016-07-29 DIAGNOSIS — E119 Type 2 diabetes mellitus without complications: Secondary | ICD-10-CM | POA: Diagnosis not present

## 2016-07-29 DIAGNOSIS — R471 Dysarthria and anarthria: Secondary | ICD-10-CM

## 2016-07-29 DIAGNOSIS — IMO0001 Reserved for inherently not codable concepts without codable children: Secondary | ICD-10-CM

## 2016-07-29 DIAGNOSIS — I10 Essential (primary) hypertension: Secondary | ICD-10-CM | POA: Diagnosis not present

## 2016-07-29 DIAGNOSIS — R29898 Other symptoms and signs involving the musculoskeletal system: Secondary | ICD-10-CM

## 2016-07-29 DIAGNOSIS — Z09 Encounter for follow-up examination after completed treatment for conditions other than malignant neoplasm: Secondary | ICD-10-CM

## 2016-07-29 LAB — BASIC METABOLIC PANEL
BUN: 20 mg/dL (ref 6–23)
CALCIUM: 9.5 mg/dL (ref 8.4–10.5)
CO2: 32 meq/L (ref 19–32)
CREATININE: 0.84 mg/dL (ref 0.40–1.20)
Chloride: 99 mEq/L (ref 96–112)
GFR: 70.01 mL/min (ref >60.00)
Glucose, Bld: 154 mg/dL — ABNORMAL HIGH (ref 70–99)
Potassium: 4.4 mEq/L (ref 3.5–5.1)
Sodium: 139 mEq/L (ref 135–145)

## 2016-07-29 LAB — CBC WITH DIFFERENTIAL/PLATELET
BASOS PCT: 0.1 % (ref 0.0–3.0)
Basophils Absolute: 0 10*3/uL (ref 0.0–0.1)
EOS PCT: 3.7 % (ref 0.0–5.0)
Eosinophils Absolute: 0.2 10*3/uL (ref 0.0–0.7)
HCT: 37.1 % (ref 36.0–46.0)
Hemoglobin: 12.7 g/dL (ref 12.0–15.0)
LYMPHS ABS: 0.9 10*3/uL (ref 0.7–4.0)
Lymphocytes Relative: 15.5 % (ref 12.0–46.0)
MCHC: 34.1 g/dL (ref 30.0–36.0)
MCV: 88.4 fl (ref 78.0–100.0)
MONOS PCT: 7.3 % (ref 3.0–12.0)
Monocytes Absolute: 0.4 10*3/uL (ref 0.1–1.0)
NEUTROS ABS: 4.2 10*3/uL (ref 1.4–7.7)
NEUTROS PCT: 73.4 % (ref 43.0–77.0)
PLATELETS: 250 10*3/uL (ref 150.0–400.0)
RBC: 4.2 Mil/uL (ref 3.87–5.11)
RDW: 12.7 % (ref 11.5–15.5)
WBC: 5.7 10*3/uL (ref 4.0–10.5)

## 2016-07-29 MED ORDER — CARVEDILOL 12.5 MG PO TABS: 18.7500 mg | ORAL_TABLET | Freq: Two times a day (BID) | ORAL | 3 refills | Status: DC

## 2016-07-29 NOTE — Patient Instructions (Signed)
Increase the carvedilol to 1.5  ...  Twice a day but if gets ..too low . Will notify you  of labs when available.  Will arrange a home health referral for  Speech PT OT .  As we discussed . Will send a message to cards about  Getting your appt with them.  Also will send  Note to neurology .  I agree  Transmission of information could be better on discharge from the hospital.  Would  ROV   with me     About   BP  And fu .

## 2016-07-30 DIAGNOSIS — E785 Hyperlipidemia, unspecified: Secondary | ICD-10-CM

## 2016-07-30 DIAGNOSIS — D649 Anemia, unspecified: Secondary | ICD-10-CM

## 2016-07-30 DIAGNOSIS — Z7902 Long term (current) use of antithrombotics/antiplatelets: Secondary | ICD-10-CM

## 2016-07-30 DIAGNOSIS — I69393 Ataxia following cerebral infarction: Secondary | ICD-10-CM

## 2016-07-30 DIAGNOSIS — I69334 Monoplegia of upper limb following cerebral infarction affecting left non-dominant side: Secondary | ICD-10-CM | POA: Diagnosis not present

## 2016-07-30 DIAGNOSIS — E119 Type 2 diabetes mellitus without complications: Secondary | ICD-10-CM

## 2016-07-30 DIAGNOSIS — I10 Essential (primary) hypertension: Secondary | ICD-10-CM

## 2016-07-30 DIAGNOSIS — I69392 Facial weakness following cerebral infarction: Secondary | ICD-10-CM | POA: Diagnosis not present

## 2016-07-30 DIAGNOSIS — Z7984 Long term (current) use of oral hypoglycemic drugs: Secondary | ICD-10-CM

## 2016-07-30 DIAGNOSIS — M1990 Unspecified osteoarthritis, unspecified site: Secondary | ICD-10-CM

## 2016-07-30 DIAGNOSIS — I69322 Dysarthria following cerebral infarction: Secondary | ICD-10-CM | POA: Diagnosis not present

## 2016-08-01 ENCOUNTER — Telehealth: Payer: Self-pay | Admitting: Internal Medicine

## 2016-08-01 NOTE — Telephone Encounter (Signed)
° °  Amy from [facility] call to get verbal orders for PT 2 TIMES A WEEK FOR 3 WEEKS    [phone]

## 2016-08-01 NOTE — Telephone Encounter (Signed)
Ok to do  as well as speech tx and OT

## 2016-08-02 NOTE — Telephone Encounter (Signed)
Left a message to proceed with orders on identified voicemail and call back if any questions.

## 2016-08-03 ENCOUNTER — Telehealth: Payer: Self-pay | Admitting: Internal Medicine

## 2016-08-03 NOTE — Telephone Encounter (Signed)
Please give order  For OT as  Requested .

## 2016-08-03 NOTE — Telephone Encounter (Signed)
Sharon Wilson is calling requesting verbal order for SP for twice a wk for 1 wk and once for final wk

## 2016-08-03 NOTE — Telephone Encounter (Signed)
Notified Michelle

## 2016-08-03 NOTE — Telephone Encounter (Signed)
Sharon Wilson with [name] has completed Home health OT for pt and is requesting verbal orders 1 wk/ 1 2 wk / 2

## 2016-08-04 NOTE — Telephone Encounter (Signed)
Plesase send in order as requested   ( already ordered pt and ot) Thanks  [facility]

## 2016-08-04 NOTE — Telephone Encounter (Signed)
Spoke with Sonal, notified her that Dr Regis Bill is agreeable to verbal orders

## 2016-08-16 ENCOUNTER — Encounter: Payer: Self-pay | Admitting: Internal Medicine

## 2016-08-16 MED ORDER — PRAVASTATIN SODIUM 40 MG PO TABS: 40.0000 mg | ORAL_TABLET | Freq: Every day | ORAL | 5 refills | Status: DC

## 2016-08-16 MED ORDER — CLOPIDOGREL BISULFATE 75 MG PO TABS: 75.0000 mg | ORAL_TABLET | Freq: Every day | ORAL | 5 refills | Status: DC

## 2016-08-16 NOTE — Telephone Encounter (Signed)
Ok to refill these   She was put on this in hospital and we can continue them   Refill for 6 months please

## 2016-08-19 MED ORDER — CLOPIDOGREL BISULFATE 75 MG PO TABS: 75.0000 mg | ORAL_TABLET | Freq: Every day | ORAL | 1 refills | Status: DC

## 2016-08-19 MED ORDER — PRAVASTATIN SODIUM 40 MG PO TABS: 40.0000 mg | ORAL_TABLET | Freq: Every day | ORAL | 1 refills | Status: DC

## 2016-08-19 NOTE — Addendum Note (Signed)
Addended by: Miles Costain T on: 08/19/2016 10:26 AM   Modules accepted: Orders

## 2016-08-22 ENCOUNTER — Ambulatory Visit (INDEPENDENT_AMBULATORY_CARE_PROVIDER_SITE_OTHER): Payer: Medicare Other | Admitting: Cardiology

## 2016-08-22 ENCOUNTER — Encounter: Payer: Self-pay | Admitting: Cardiology

## 2016-08-22 VITALS — BP 132/70 | HR 72 | Ht 62.5 in | Wt 124.1 lb

## 2016-08-22 DIAGNOSIS — I10 Essential (primary) hypertension: Secondary | ICD-10-CM

## 2016-08-22 DIAGNOSIS — R931 Abnormal findings on diagnostic imaging of heart and coronary circulation: Secondary | ICD-10-CM | POA: Diagnosis not present

## 2016-08-22 NOTE — Patient Instructions (Signed)
Medication Instructions:  Your physician recommends that you continue on your current medications as directed. Please refer to the Current Medication list given to you today.   Labwork: None  Testing/Procedures: Dr. Radford Pax recommends you have a NUCLEAR STRESS TEST.  Follow-Up: Your physician recommends that you schedule a follow-up appointment AS NEEDED with Dr. Radford Pax pending study results.  Any Other Special Instructions Will Be Listed Below (If Applicable).     If you need a refill on your cardiac medications before your next appointment, please call your pharmacy.

## 2016-08-22 NOTE — Progress Notes (Signed)
Cardiology Office Note    Date:  08/23/2016   ID:  Sharon Wilson, DOB Dec 26, 1940, MRN [id number]  PCP:  Lottie Dawson, MD  Cardiologist:  Fransico Him, MD   Chief Complaint  Patient presents with  . New Evaluation    abnormal echo    History of Present Illness:  Sharon Wilson is a 75 y.o. female with a history of DM and white coat HTN who was recently seen in the ER with complaints of left sided facial droop after being discharged from hospital with subacute CVA (CT with small area of low attenuation in the right frontoparietal region c/w acute or subacute lacunar infarct and MRI showed acute infarct of the right centrum semiovale and possibly posterior limb of right internal capsule.  MRA of head showed mild to moderate multifocal narrowing of the MCA arteries without occlusion.  Echo showed low normal lVF with EF 50-55% with AK of the basal and mid inferolateral myocardium. Carotid dopplers showed  1-39% right ICA stenosis.  Neurology felt symptoms were due to small vessel disease and was started on Plavix. Her symptoms improved in the hospital initially and then when she go home she had more symptoms.    She is now referred to Cardiology for evaluation of abnormal echo.  She denies any chest pain, SOB, DOE, LE edema, dizziness, palpitations or syncope.     Past Medical History:  Diagnosis Date  . Anemia    hysterectomy  . Diabetes mellitus   . Diverticulosis   . Hyperlipidemia   . OA (osteoarthritis)   . White coat hypertension     Past Surgical History:  Procedure Laterality Date  . ABDOMINAL HYSTERECTOMY     fibroid bleeding  . carotid dopplers  11/11   per [facility]  . CATARACT EXTRACTION  2011    Current Medications: Outpatient Medications Prior to Visit  Medication Sig Dispense Refill  . Calcium Carbonate-Vitamin D (OSCAL 500/200 D-3 PO) Take 1 tablet by mouth 2 (two) times daily.     . carvedilol (COREG) 12.5 MG tablet Take 1.5 tablets  (18.75 mg total) by mouth 2 (two) times daily with a meal. 270 tablet 3  . clopidogrel (PLAVIX) 75 MG tablet Take 1 tablet (75 mg total) by mouth daily. 90 tablet 1  . metFORMIN (GLUCOPHAGE) 500 MG tablet TAKE ONE TABLET BY MOUTH IN THE MORNING AND TWO TABS IN THE EVENING 360 tablet 0  . pravastatin (PRAVACHOL) 40 MG tablet Take 1 tablet (40 mg total) by mouth daily at 6 PM. 90 tablet 1   No facility-administered medications prior to visit.      Allergies:   Ace inhibitors; Lovastatin; and Norvasc [amlodipine besylate]   Social History   Social History  . Marital status: Married    Spouse name: N/A  . Number of children: N/A  . Years of education: N/A   Social History Main Topics  . Smoking status: Never Smoker  . Smokeless tobacco: Never Used  . Alcohol use No  . Drug use: No  . Sexual activity: Not Asked   Other Topics Concern  . None   Social History Narrative   Married husband no retired   hh of 2   No pets     Family History:  The patient's family history includes Cancer in her mother.   ROS:   Please see the history of present illness.    ROS All other systems reviewed and are negative.  PAD Screen 08/22/2016  Previous PAD dx? No  Previous surgical procedure? No  Pain with walking? No  Feet/toe relief with dangling? No  Painful, non-healing ulcers? No  Extremities discolored? No       PHYSICAL EXAM:   VS:  BP 132/70 (BP Location: Right Arm)   Pulse 72   Ht 5' 2.5" (1.588 m)   Wt 124 lb 1.9 oz (56.3 kg)   BMI 22.34 kg/m    GEN: Well nourished, well developed, in no acute distress  HEENT: normal  Neck: no JVD, carotid bruits, or masses Cardiac: RRR; no murmurs, rubs, or gallops,no edema.  Intact distal pulses bilaterally.  Respiratory:  clear to auscultation bilaterally, normal work of breathing GI: soft, nontender, nondistended, + BS MS: no deformity or atrophy  Skin: warm and dry, no rash Neuro:  Alert and Oriented x 3, Strength and sensation  are intact Psych: euthymic mood, full affect  Wt Readings from Last 3 Encounters:  08/23/16 125 lb (56.7 kg)  08/22/16 124 lb 1.9 oz (56.3 kg)  07/29/16 124 lb 9.6 oz (56.5 kg)      Studies/Labs Reviewed:   EKG:  EKG is not ordered today.   Recent Labs: 09/29/2015: TSH 4.13 07/23/2016: ALT 12 07/29/2016: BUN 20; Creatinine, Ser 0.84; Hemoglobin 12.7; Platelets 250.0; Potassium 4.4; Sodium 139   Lipid Panel    Component Value Date/Time   CHOL 238 (H) 07/21/2016 0744   TRIG 294 (H) 07/21/2016 0744   HDL 32 (L) 07/21/2016 0744   CHOLHDL 7.4 07/21/2016 0744   VLDL 59 (H) 07/21/2016 0744   LDLCALC 147 (H) 07/21/2016 0744   LDLDIRECT 136.0 05/05/2016 0816    Additional studies/ records that were reviewed today include:  Echo    ASSESSMENT:    1. Essential hypertension   2. Abnormal echocardiogram      PLAN:  In order of problems listed above:  1.  HTN - BP controlled on current meds.  Continue Coreg 2.  Abnormal echocardiogram showing akinesis of the basal and mid inferolateral walls with EF 50-55%.  Echo was done for workup of CVA.  She has no symptoms of chest pain or SOB but does have cardiac risk factors including HTN, DM, PVD and hyperlipidemia.  I will get a nuclear stress test to rule out ischemia.   3.  Recent CVA felt by neuro to be related to small vessel disease and not cardioembolic.  Continue plavix.  4.  PVD - mild 1-39% bilateral carotid artery stenosis -  Continue plavix and statin.     Medication Adjustments/Labs and Tests Ordered: Current medicines are reviewed at length with the patient today.  Concerns regarding medicines are outlined above.  Medication changes, Labs and Tests ordered today are listed in the Patient Instructions below.  Patient Instructions  Medication Instructions:  Your physician recommends that you continue on your current medications as directed. Please refer to the Current Medication list given to you today.    Labwork: None  Testing/Procedures: Dr. Radford Pax recommends you have a NUCLEAR STRESS TEST.  Follow-Up: Your physician recommends that you schedule a follow-up appointment AS NEEDED with Dr. Radford Pax pending study results.  Any Other Special Instructions Will Be Listed Below (If Applicable).     If you need a refill on your cardiac medications before your next appointment, please call your pharmacy.      Signed, Fransico Him, MD  08/23/2016 9:44 PM    [address], [place], [place]  [address] Phone: [phone]; Fax: [phone]

## 2016-08-23 ENCOUNTER — Ambulatory Visit (INDEPENDENT_AMBULATORY_CARE_PROVIDER_SITE_OTHER): Payer: Medicare Other | Admitting: Nurse Practitioner

## 2016-08-23 ENCOUNTER — Encounter: Payer: Self-pay | Admitting: Nurse Practitioner

## 2016-08-23 VITALS — BP 156/61 | HR 65 | Ht 62.5 in | Wt 125.0 lb

## 2016-08-23 DIAGNOSIS — E785 Hyperlipidemia, unspecified: Secondary | ICD-10-CM

## 2016-08-23 DIAGNOSIS — I639 Cerebral infarction, unspecified: Secondary | ICD-10-CM

## 2016-08-23 DIAGNOSIS — I10 Essential (primary) hypertension: Secondary | ICD-10-CM

## 2016-08-23 NOTE — Patient Instructions (Addendum)
Stressed the importance of management of risk factors to prevent further stroke Continue Plavix for secondary stroke prevention Maintain strict control of hypertension with blood pressure goal below 130/90, today's reading156/61 continue antihypertensive medications Control of diabetes with hemoglobin A1c below 6.5 followed by primary care most recent hemoglobin A1c7.3  continue diabetic medications Cholesterol with LDL cholesterol less than 70, followed by primary care,  most recent 147 continue statin drugs Exercise by walking, slowly increase , eat healthy diet with whole grains,  fresh fruits and vegetables Discussed risk for recurrent stroke/ TIA and answered additional questions Follow-up in 4 months

## 2016-08-23 NOTE — Progress Notes (Signed)
[facility]  PATIENT: Sharon Wilson DOB: 21-Sep-1941   REASON FOR VISIT: Hospital follow-up for stroke HISTORY FROM: Patient    HISTORY OF PRESENT ILLNESS:Tyreanna W Robinsonis an 75 y.o.femalewith medical history significant for type 2 diabetes mellitus, hypertension, and hyperlipidemia who presents to the emergency department 2 days after symptoms occurred for evaluation of dysarthria and transient loss of coordination involving the left hand. Patient was going to bed on the morning of 07/18/2016 at 12:30 AM when she noticed that her left hand was clumsy while flossing her teeth. She went to bed and noted that her speech was odd but when asking her husband he did not feel anything was wrong.. The next day she was talking to her kids and they noted that her speech was off. She presented to her PCP who quickly sent her to the emergency department as he felt she was likely having a stroke. At that time the only symptoms she had was dysarthria. Patient endorses that she takes aspirin 81 mg daily and has not missed any doses. Patient states that she does not smoke. MRI of the brain with right BG/CR infarct. MRA with diffuse narrowing of both middle cerebral arteries without occlusion. Carotid ultrasound with 40-59% left ICA stenosis. 2-D echo EF 50-55%. LDL 147 hemoglobin A1c 7.3. She was placed on Plavix for secondary stroke prevention. She returns to the stroke clinic today for follow-up without further stroke or TIA symptoms. She remains on Plavix with minimal bruising , no  bleeding. Blood pressure elevated today 156/61, patient claims she has whitecoat hypertension. She is currently not exercising she returns for reevaluation    REVIEW OF SYSTEMS: Full 14 system review of systems performed and notable only for those listed, all others are neg:  Constitutional: neg  Cardiovascular: neg Ear/Nose/Throat: neg  Skin: neg Eyes: neg Respiratory: neg Gastroitestinal: neg    Hematology/Lymphatic: neg  Endocrine: neg Musculoskeletal:neg Allergy/Immunology: neg Neurological:Recent stroke  symptoms of dysarthria  Psychiatric: neg Sleep : neg   ALLERGIES: Allergies  Allergen Reactions  . Ace Inhibitors Hives, Swelling and Cough  . Lovastatin Other (See Comments)    Leg cramps  Can take pravastatin  . Norvasc [Amlodipine Besylate]     Light headed and vertigo like  Also may have had hives.     HOME MEDICATIONS: Outpatient Medications Prior to Visit  Medication Sig Dispense Refill  . Calcium Carbonate-Vitamin D (OSCAL 500/200 D-3 PO) Take 1 tablet by mouth 2 (two) times daily.     . carvedilol (COREG) 12.5 MG tablet Take 1.5 tablets (18.75 mg total) by mouth 2 (two) times daily with a meal. 270 tablet 3  . clopidogrel (PLAVIX) 75 MG tablet Take 1 tablet (75 mg total) by mouth daily. 90 tablet 1  . metFORMIN (GLUCOPHAGE) 500 MG tablet TAKE ONE TABLET BY MOUTH IN THE MORNING AND TWO TABS IN THE EVENING 360 tablet 0  . pravastatin (PRAVACHOL) 40 MG tablet Take 1 tablet (40 mg total) by mouth daily at 6 PM. 90 tablet 1   No facility-administered medications prior to visit.     PAST MEDICAL HISTORY: Past Medical History:  Diagnosis Date  . Anemia    hysterectomy  . Diabetes mellitus   . Diverticulosis   . Hyperlipidemia   . OA (osteoarthritis)   . White coat hypertension     PAST SURGICAL HISTORY: Past Surgical History:  Procedure Laterality Date  . ABDOMINAL HYSTERECTOMY     fibroid bleeding  . carotid dopplers  11/11   per [facility]  . CATARACT EXTRACTION  2011    FAMILY HISTORY: Family History  Problem Relation Age of Onset  . Cancer Mother     breast    SOCIAL HISTORY: Social History   Social History  . Marital status: Married    Spouse name: N/A  . Number of children: N/A  . Years of education: N/A   Occupational History  . Not on file.   Social History Main Topics  . Smoking status: Never Smoker  . Smokeless tobacco:  Never Used  . Alcohol use No  . Drug use: No  . Sexual activity: Not on file   Other Topics Concern  . Not on file   Social History Narrative   Married husband no retired   hh of 2   No pets     PHYSICAL EXAM  Vitals:   08/23/16 1408  BP: (!) 156/61  Pulse: 65  Weight: 125 lb (56.7 kg)  Height: 5' 2.5" (1.588 m)   Body mass index is 22.5 kg/m.  Generalized: Well developed, in no acute distress  Head: normocephalic and atraumatic,. Oropharynx benign  Neck: Supple, no carotid bruits  Cardiac: Regular rate rhythm, no murmur  Musculoskeletal: No deformity   Neurological examination   Mentation: Alert oriented to time, place, history taking. Attention span and concentration appropriate. Recent and remote memory intact.  Follows all commands speech and language fluent.   Cranial nerve II-XII: Pupils were equal round reactive to light extraocular movements were full, visual field were full on confrontational test. Facial sensation and strength were normal. hearing was intact to finger rubbing bilaterally. Uvula tongue midline. head turning and shoulder shrug were normal and symmetric.Tongue protrusion into cheek strength was normal. Motor: normal bulk and tone, full strength in the BUE, BLE, fine finger movements normal, no pronator drift. No focal weakness Sensory: normal and symmetric to light touch, pinprick, and  Vibration, proprioception  Coordination: finger-nose-finger, heel-to-shin bilaterally, no dysmetria Reflexes: Brachioradialis 2/2, biceps 2/2, triceps 2/2, patellar 2/2, Achilles 2/2, plantar responses were flexor bilaterally. Gait and Station: Rising up from seated position without assistance, normal stance,  moderate stride, good arm swing, smooth turning, able to perform tiptoe, and heel walking without difficulty. Tandem gait is  mildly unsteady  DIAGNOSTIC DATA (LABS, IMAGING, TESTING) - I reviewed patient records, labs, notes, testing and imaging myself where  available.  Lab Results  Component Value Date   WBC 5.7 07/29/2016   HGB 12.7 07/29/2016   HCT 37.1 07/29/2016   MCV 88.4 07/29/2016   PLT 250.0 07/29/2016      Component Value Date/Time   NA 139 07/29/2016 1214   K 4.4 07/29/2016 1214   CL 99 07/29/2016 1214   CO2 32 07/29/2016 1214   GLUCOSE 154 (H) 07/29/2016 1214   BUN 20 07/29/2016 1214   CREATININE 0.84 07/29/2016 1214   CALCIUM 9.5 07/29/2016 1214   PROT 6.8 07/23/2016 0202   ALBUMIN 4.1 07/23/2016 0202   AST 19 07/23/2016 0202   ALT 12 (L) 07/23/2016 0202   ALKPHOS 57 07/23/2016 0202   BILITOT 0.5 07/23/2016 0202   GFRNONAA 54 (L) 07/23/2016 0202   GFRAA >60 07/23/2016 0202   Lab Results  Component Value Date   CHOL 238 (H) 07/21/2016   HDL 32 (L) 07/21/2016   LDLCALC 147 (H) 07/21/2016   LDLDIRECT 136.0 05/05/2016   TRIG 294 (H) 07/21/2016   CHOLHDL 7.4 07/21/2016   Lab Results  Component Value Date  HGBA1C 7.3 (H) 07/21/2016       ASSESSMENT AND PLAN  75 y.o. year old female  has a past medical history of Anemia; Diabetes mellitus; Diverticulosis; Hyperlipidemia; OA (osteoarthritis); and White coat hypertension. And hospital follow-up for recent stroke MRI of the brain with right BG/CR infarct. MRA with diffuse narrowing of both middle cerebral arteries without occlusion. Carotid ultrasound with 40-59% left ICA stenosis. 2-D echo EF 50-55%. LDL 147 hemoglobin A1c 7.3. She was placed on Plavix for secondary stroke prevention.   PLAN: Stressed the importance of management of risk factors to prevent further stroke Continue Plavix for secondary stroke prevention Maintain strict control of hypertension with blood pressure goal below 130/90, today's reading156/61 continue antihypertensive medications Control of diabetes with hemoglobin A1c below 6.5 followed by primary care most recent hemoglobin A1c7.3  continue diabetic medications Cholesterol with LDL cholesterol less than 70, followed by primary care,   most recent 147 continue statin drugs Exercise by walking, slowly increase , eat healthy diet with whole grains,  fresh fruits and vegetables Discussed risk for recurrent stroke/ TIA and answered additional questions Follow-up in 4 months This was a visit requiring 30 minutes and medical decision making of high complexity with extensive review of history, hospital chart, counseling and answering questions Dennie Bible, [facility], [facility], APRN  [facility] [address] [place], [place] [address] [phone]

## 2016-08-25 NOTE — Progress Notes (Signed)
I reviewed above note and agree with the assessment and plan.  May consider carotid doppler annually to monitor the asymptomatic left ICA stenosis. Thanks.   Rosalin Hawking, MD PhD Stroke Neurology 08/25/2016 6:41 PM

## 2016-08-30 ENCOUNTER — Telehealth (HOSPITAL_COMMUNITY): Payer: Self-pay | Admitting: *Deleted

## 2016-08-30 NOTE — Telephone Encounter (Signed)
Patient given detailed instructions per Myocardial Perfusion Study Information Sheet for the test on 09/01/16 at 0730. Patient notified to arrive 15 minutes early and that it is imperative to arrive on time for appointment to keep from having the test rescheduled.  If you need to cancel or reschedule your appointment, please call the office within 24 hours of your appointment. Failure to do so may result in a cancellation of your appointment, and a $50 no show fee. Patient verbalized understanding.Karrin Eisenmenger, [facility]anae [place]ms

## 2016-08-31 ENCOUNTER — Encounter (HOSPITAL_COMMUNITY): Payer: Medicare Other

## 2016-09-01 ENCOUNTER — Ambulatory Visit (HOSPITAL_COMMUNITY): Payer: Medicare Other | Attending: Cardiology

## 2016-09-01 DIAGNOSIS — Z8673 Personal history of transient ischemic attack (TIA), and cerebral infarction without residual deficits: Secondary | ICD-10-CM | POA: Diagnosis not present

## 2016-09-01 DIAGNOSIS — R931 Abnormal findings on diagnostic imaging of heart and coronary circulation: Secondary | ICD-10-CM | POA: Insufficient documentation

## 2016-09-01 DIAGNOSIS — I10 Essential (primary) hypertension: Secondary | ICD-10-CM | POA: Insufficient documentation

## 2016-09-01 DIAGNOSIS — R9439 Abnormal result of other cardiovascular function study: Secondary | ICD-10-CM | POA: Diagnosis not present

## 2016-09-01 LAB — MYOCARDIAL PERFUSION IMAGING
CHL CUP MPHR: 144 {beats}/min
CHL CUP NUCLEAR SDS: 1
CHL CUP NUCLEAR SRS: 7
CHL CUP RESTING HR STRESS: 68 {beats}/min
CSEPED: 6 min
CSEPHR: 88 %
CSEPPHR: 126 {beats}/min
Estimated workload: 6.1 METS
Exercise duration (sec): 15 s
LHR: 0.17
LVDIAVOL: 71 mL (ref 46–106)
LVSYSVOL: 38 mL
SSS: 8
TID: 0.96

## 2016-09-01 MED ORDER — TECHNETIUM TC 99M TETROFOSMIN IV KIT
32.7000 | PACK | Freq: Once | INTRAVENOUS | Status: AC | PRN
  Administered 2016-09-01: 32.7 via INTRAVENOUS
  Filled 2016-09-01: qty 33

## 2016-09-01 MED ORDER — TECHNETIUM TC 99M TETROFOSMIN IV KIT
10.5000 | PACK | Freq: Once | INTRAVENOUS | Status: AC | PRN
  Administered 2016-09-01: 10.5 via INTRAVENOUS
  Filled 2016-09-01: qty 11

## 2016-09-08 ENCOUNTER — Telehealth: Payer: Self-pay

## 2016-09-08 DIAGNOSIS — R9439 Abnormal result of other cardiovascular function study: Secondary | ICD-10-CM

## 2016-09-08 NOTE — Telephone Encounter (Signed)
-----   Message from Sueanne Margarita, MD sent at 09/02/2016  8:05 AM EDT ----- Please get cardiac CTA with morphology and calcium score to evaluate for CAD.  Patient just had CVA and would like to avoid cath.  Stress test with ? Small area of apical ischemia.

## 2016-09-08 NOTE — Telephone Encounter (Signed)
Informed patient of results and verbal understanding expressed.   Coronary CT ordered for scheduling. Patient agrees with treatment plan. 

## 2016-09-12 ENCOUNTER — Encounter: Payer: Self-pay | Admitting: Cardiology

## 2016-09-14 ENCOUNTER — Other Ambulatory Visit: Payer: Medicare Other | Admitting: *Deleted

## 2016-09-14 DIAGNOSIS — R9439 Abnormal result of other cardiovascular function study: Secondary | ICD-10-CM

## 2016-09-14 LAB — BASIC METABOLIC PANEL
BUN: 16 mg/dL (ref 7–25)
CHLORIDE: 102 mmol/L (ref 98–110)
CO2: 29 mmol/L (ref 20–31)
CREATININE: 0.76 mg/dL (ref 0.60–0.93)
Calcium: 9.2 mg/dL (ref 8.6–10.4)
GLUCOSE: 115 mg/dL — AB (ref 65–99)
Potassium: 4.2 mmol/L (ref 3.5–5.3)
Sodium: 139 mmol/L (ref 135–146)

## 2016-09-21 ENCOUNTER — Ambulatory Visit (HOSPITAL_COMMUNITY)
Admission: RE | Admit: 2016-09-21 | Discharge: 2016-09-21 | Disposition: A | Discharge status: Home / Self Care | Payer: Medicare Other | Source: Ambulatory Visit | Attending: Cardiology | Admitting: Cardiology

## 2016-09-21 ENCOUNTER — Encounter (HOSPITAL_COMMUNITY): Payer: Self-pay

## 2016-09-21 DIAGNOSIS — I2510 Atherosclerotic heart disease of native coronary artery without angina pectoris: Secondary | ICD-10-CM | POA: Insufficient documentation

## 2016-09-21 DIAGNOSIS — R918 Other nonspecific abnormal finding of lung field: Secondary | ICD-10-CM | POA: Diagnosis not present

## 2016-09-21 DIAGNOSIS — I700 Atherosclerosis of aorta: Secondary | ICD-10-CM | POA: Insufficient documentation

## 2016-09-21 DIAGNOSIS — R9439 Abnormal result of other cardiovascular function study: Secondary | ICD-10-CM | POA: Insufficient documentation

## 2016-09-21 MED ORDER — METOPROLOL TARTRATE 5 MG/5ML IV SOLN
INTRAVENOUS | Status: AC
  Administered 2016-09-21: 5 mg via INTRAVENOUS
  Filled 2016-09-21: qty 5

## 2016-09-21 MED ORDER — NITROGLYCERIN 0.4 MG SL SUBL
SUBLINGUAL_TABLET | SUBLINGUAL | Status: AC
  Administered 2016-09-21: 0.4 mg via SUBLINGUAL
  Filled 2016-09-21: qty 1

## 2016-09-21 MED ORDER — METOPROLOL TARTRATE 5 MG/5ML IV SOLN
5.0000 mg | Freq: Once | INTRAVENOUS | Status: AC
  Administered 2016-09-21: 5 mg via INTRAVENOUS

## 2016-09-21 MED ORDER — NITROGLYCERIN 0.4 MG SL SUBL
0.4000 mg | SUBLINGUAL_TABLET | Freq: Once | SUBLINGUAL | Status: AC
  Administered 2016-09-21: 0.4 mg via SUBLINGUAL

## 2016-09-21 MED ORDER — IOPAMIDOL (ISOVUE-370) INJECTION 76%
INTRAVENOUS | Status: AC
  Administered 2016-09-21: 80 mL
  Filled 2016-09-21: qty 100

## 2016-09-22 ENCOUNTER — Telehealth: Payer: Self-pay | Admitting: Cardiology

## 2016-09-22 ENCOUNTER — Ambulatory Visit (INDEPENDENT_AMBULATORY_CARE_PROVIDER_SITE_OTHER): Payer: Medicare Other | Admitting: Nurse Practitioner

## 2016-09-22 ENCOUNTER — Encounter: Payer: Self-pay | Admitting: Internal Medicine

## 2016-09-22 ENCOUNTER — Encounter: Payer: Self-pay | Admitting: Nurse Practitioner

## 2016-09-22 VITALS — BP 185/78 | HR 65 | Ht 62.0 in | Wt 124.6 lb

## 2016-09-22 DIAGNOSIS — E782 Mixed hyperlipidemia: Secondary | ICD-10-CM

## 2016-09-22 DIAGNOSIS — I10 Essential (primary) hypertension: Secondary | ICD-10-CM | POA: Diagnosis not present

## 2016-09-22 DIAGNOSIS — E119 Type 2 diabetes mellitus without complications: Secondary | ICD-10-CM

## 2016-09-22 DIAGNOSIS — Z01812 Encounter for preprocedural laboratory examination: Secondary | ICD-10-CM

## 2016-09-22 DIAGNOSIS — R931 Abnormal findings on diagnostic imaging of heart and coronary circulation: Secondary | ICD-10-CM | POA: Diagnosis not present

## 2016-09-22 DIAGNOSIS — I2510 Atherosclerotic heart disease of native coronary artery without angina pectoris: Secondary | ICD-10-CM | POA: Diagnosis not present

## 2016-09-22 LAB — BASIC METABOLIC PANEL
BUN: 13 mg/dL (ref 7–25)
CALCIUM: 9.8 mg/dL (ref 8.6–10.4)
CHLORIDE: 103 mmol/L (ref 98–110)
CO2: 25 mmol/L (ref 20–31)
Creat: 0.85 mg/dL (ref 0.60–0.93)
Glucose, Bld: 103 mg/dL — ABNORMAL HIGH (ref 65–99)
POTASSIUM: 4.5 mmol/L (ref 3.5–5.3)
SODIUM: 141 mmol/L (ref 135–146)

## 2016-09-22 LAB — APTT: aPTT: 26 s (ref 22–34)

## 2016-09-22 LAB — CBC
HCT: 36.9 % (ref 35.0–45.0)
Hemoglobin: 12.5 g/dL (ref 11.7–15.5)
MCH: 30.3 pg (ref 27.0–33.0)
MCHC: 33.9 g/dL (ref 32.0–36.0)
MCV: 89.6 fL (ref 80.0–100.0)
MPV: 9.2 fL (ref 7.5–12.5)
PLATELETS: 202 10*3/uL (ref 140–400)
RBC: 4.12 MIL/uL (ref 3.80–5.10)
RDW: 13.7 % (ref 11.0–15.0)
WBC: 4.5 10*3/uL (ref 3.8–10.8)

## 2016-09-22 LAB — TSH: TSH: 2.24 m[IU]/L

## 2016-09-22 LAB — PROTIME-INR
INR: 1
Prothrombin Time: 10.4 s (ref 9.0–11.5)

## 2016-09-22 NOTE — Telephone Encounter (Signed)
New message ° °Pt is returning call  ° °Please call back °

## 2016-09-22 NOTE — Progress Notes (Signed)
Cardiology Clinic Note   Patient Name: Sharon Wilson Date of Encounter: 09/22/2016  Primary Care Provider:  Lottie Dawson, MD Primary Cardiologist:  T. Turner, MD   Patient Profile    75 year old female status post recent stroke with other history including white coat hypertension and diabetes who was noted to have mildly abnormal echo followed by mildly abnormal stress test, and now an abnormal cardiac CT. She presents today to discuss CT results and set up catheterization.  Past Medical History    Past Medical History:  Diagnosis Date  . Abnormal echocardiogram    a.07/2016 Echo: EF 50-55%, basal and mid inferolateral AK.  . Agatston coronary artery calcium score greater than 400    a. 07/2016 Abnl Echo with basal and mid inferolateral AK, EF 50-55%; b. 09/01/2016 Ex MV: EF 46%, hypertensive response, no ST changes, apical thinning with very mild apical ischemia;  c. 09/21/2016 Cardiac CTA: Ca2+ score = 717, 90th percentile. Extensive CAD w/ mod stenosis in distal LM/ostial LAD, 50% RPLV-->rec Cath.  . Anemia    hysterectomy  . CAD (coronary artery disease)    a. 09/01/2016 Ex MV: EF 46%, hypertensive response, no ST changes, apical thinning with very mild apical ischemia;  c. 09/21/2016 Cardiac CTA: Ca2+ score = 717, 90th percentile. Extensive CAD w/ mod stenosis in distal LM/ostial LAD, 50% RPLV-->rec Cath  . Carotid arterial disease w/ left carotid bruit (Sharon Wilson)    a. 07/2016 Carotid U/S: RICA 123456, LICA 123456.  . Diverticulosis   . Hyperlipidemia   . OA (osteoarthritis)   . Stroke (cerebrum) ([place])    a. 07/2016 L facial droop-->Head CT: small area of low attenuation in the right frontoparietal region c/w acute or subacute lacunar infarct; 07/2016 MRI/A brain: acute infarct of right centrum semiovale and possibly posterior limb of right internal capsule. Mild to moderate multifocal narrowing of the MCA arteries w/o occlusion.  . Type II diabetes mellitus ([place])    a.  07/2016 A1c 7.3.  . White coat hypertension    Past Surgical History:  Procedure Laterality Date  . ABDOMINAL HYSTERECTOMY     fibroid bleeding  . carotid dopplers  11/11   [facility]  . CATARACT EXTRACTION  2011    Allergies  Allergies  Allergen Reactions  . Ace Inhibitors Hives, Swelling and Cough  . Lovastatin Other (See Comments)    Leg cramps  Can take pravastatin  . Norvasc [Amlodipine Besylate]     Light headed and vertigo like  Also may have had hives.     History of Present Illness    75 year old female with the above complex past medical history including anemia, white coat hypertension, and diabetes. She says her history of whitecoat hypertension dates back quite a long time and it is not uncommon for her to have blood pressures in the 170s and 180s when either in a provider's office or in the hospital. At home however pressures typically run in the low 100s to 1 teens.  In September of this year, she was admitted to the hospital with left-sided facial droop. Head CT suggested acute versus subacute lacunar infarct while MRI showed an infarct in the right centrum semi-ovale and possibly posterior limb of the right internal capsule. She had mild to moderate multifocal narrowing of the MCA arteries without occlusion. Echocardiogram during hospitalization showed normal LV function however, she was noted to have basal and mid inferolateral akinesis. She was discharged home on Plavix and statin therapy was subsequently referred  to cardiology because of abnormal echo. She was seen by Dr. Radford Wilson and was asymptomatic from a cardiac standpoint. Given abnormal echo however with risk factors of whitecoat hypertension, diabetes, hyperlipidemia, and recent stroke, she underwent stress testing which showed apical thinning with very mild apical ischemia. In that setting, further evaluation was felt to be warranted. Patient preferred to avoid diagnostic catheterization and cardiac CT angiogram  was just performed on November 15, and revealed a coronary calcium score of 717, placing her in the 90th percentile. She also had suggestion of distal left main/ostial LAD plaquing of less than 50% and an area in the RP LV that was approximately 50% as well. Given these findings, recommendation was made for diagnostic catheterization and Sharon Wilson was contacted today with results and placed on my schedule to discuss. She is here with her husband today. He has multiple questions and we discussed the workup up to this point, indications for proceeding with diagnostic catheterization, risks related to diagnostic catheterization, and role of percutaneous intervention versus bypass grafting if she were found to have significant coronary artery disease. After lengthy discussion extending out to 60 minutes, patient and husband have agreed to proceed with outpatient diagnostic catheterization. Interestingly, patient has no prior history of chest pain or dyspnea and has been doing well since her stroke.   Home Medications    Prior to Admission medications   Medication Sig Start Date End Date Taking? Authorizing Provider  Calcium Carbonate-Vitamin D (OSCAL 500/200 D-3 PO) Take 1 tablet by mouth 2 (two) times daily.    Yes Historical Provider, MD  carvedilol (COREG) 12.5 MG tablet Take 1.5 tablets (18.75 mg total) by mouth 2 (two) times daily with a meal. 07/29/16  Yes [name], MD  clopidogrel (PLAVIX) 75 MG tablet Take 1 tablet (75 mg total) by mouth daily. 08/19/16  Yes [name], MD  metFORMIN (GLUCOPHAGE) 500 MG tablet TAKE ONE TABLET BY MOUTH IN THE MORNING AND TWO TABS IN THE EVENING 07/08/16  Yes [name], MD  pravastatin (PRAVACHOL) 40 MG tablet Take 1 tablet (40 mg total) by mouth daily at 6 PM. 08/19/16  Yes [name], MD    Family History    Family History  Problem Relation Age of Onset  . Cancer Mother     breast  . CAD Neg Hx     Social History    Social History    Social History  . Marital status: Married    Spouse name: N/A  . Number of children: N/A  . Years of education: N/A   Occupational History  . Not on file.   Social History Main Topics  . Smoking status: Never Smoker  . Smokeless tobacco: Never Used  . Alcohol use No  . Drug use: No  . Sexual activity: Not on file   Other Topics Concern  . Not on file   Social History Narrative   Lives in [place] with husband.   Retired.   hh of 2   No pets     Review of Systems    General:  No chills, fever, night sweats or weight changes.  Cardiovascular:  No chest pain, dyspnea on exertion, edema, orthopnea, palpitations, paroxysmal nocturnal dyspnea. Dermatological: No rash, lesions/masses Respiratory: No cough, dyspnea Urologic: No hematuria, dysuria Abdominal:   No nausea, vomiting, diarrhea, bright red blood per rectum, melena, or hematemesis Neurologic:  No visual changes, wkns, changes in mental status. All other systems reviewed and are otherwise  negative except as noted above.  Physical Exam    VS:  BP (!) 185/78   Pulse 65   Ht 5\' 2"  (1.575 m)   Wt 124 lb 9.6 oz (56.5 kg)   BMI 22.79 kg/m  , BMI Body mass index is 22.79 kg/m. GEN: Well nourished, well developed, in no acute distress.  HEENT: normal.  Neck: Supple, no JVD, + L carotid bruits, no masses. Cardiac: RRR, no murmurs, rubs, or gallops. No clubbing, cyanosis, edema.  Radials/DP/PT 2+ and equal bilaterally.  Respiratory:  Respirations regular and unlabored, clear to auscultation bilaterally. GI: Soft, nontender, nondistended, BS + x 4. MS: no deformity or atrophy. Skin: warm and dry, no rash. Neuro:  Strength and sensation are intact. Psych: Normal affect.  Accessory Clinical Findings    ECG - Regular sinus rhythm, 65 first-degree AV block, left axis, nonspecific ST changes.   Assessment & Plan   1.  Coronary artery disease/coronary artery calcium score of 719: Patient was admitted with stroke in  September 2017 and as part of workup, echocardiogram was performed and showed normal LV function with evidence of basal and mid inferolateral akinesis. This led to cardiology evaluation and stress testing which showed very mild apical ischemia. This was followed with cardiac CT angiography which showed a markedly elevated coronary artery calcium score of 719, placing the patient in the 90th percentile. There is also suggestion of at least moderate distal left main/ostial LAD and RPLV disease. Ms. Peglow has no prior history of chest pain or dyspnea. Risk factors do include diabetes, white coat hypertension, hyperlipidemia, prior stroke, and carotid vascular disease. I spent an hour with Ms. Stauffer and her husband today discussing her testing up to this point as well as indications for future testing, specifically diagnostic catheterization. All questions were answered. The patient understands that risks include but are not limited to stroke (1 in 1000), death (1 in 56), kidney failure [usually temporary] (1 in 500), bleeding (1 in 200), allergic reaction [possibly serious] (1 in 200), and agrees to proceed. She is scheduled have this performed tomorrow, November 16 with Dr. Saunders Revel.  I will follow-up a CBC, basic metabolic panel, and coagulation studies today.  Chest x-ray was last performed in September with CT of her chest performed yesterday. She remains on Plavix, beta blocker, and statin therapy.   2. Whitecoat hypertension: Blood pressure is elevated today at 185/78. She says this is very common for her. She does check her blood pressure regularly at home and says it typically runs in the low 100s to 1 teens.  I have asked that she continue to follow her blood pressures at home. She is on carvedilol 18.75 mg twice a day. I suspect her pressure runs high more often than she thinks. Pressures elevated at home, I would have a low threshold to plan to add ACE inhibitor therapy given history of diabetes.  3.  Hyperlipidemia: She is on Pravachol therapy. She has not tolerated multiple other statins but is tolerating Pravachol at this dose at this time. LDL was 147 in September. She was not on a statin when that was checked.  4. Recent stroke: She has recovered quite well. She remains on Plavix and statin therapy.  5. Type 2 diabetes mellitus: She will need to hold her metformin in preparation for diagnostic catheterization. This is otherwise managed by primary care.   6. Carotid arterial disease: She does have a left carotid bruit. Carotid ultrasound in September showed 1-39% right internal carotid  artery stenosis with 40-59% left internal carotid artery stenosis. She remains on statin and Plavix therapy.   7. Disposition: Follow-up labs today. Plan diagnostic catheterization tomorrow, November 17. Follow-up with Dr. Radford Wilson in approximately 2-3 weeks.    Murray Hodgkins, NP 09/22/2016, 4:05 PM

## 2016-09-22 NOTE — Telephone Encounter (Signed)
Notes Recorded by Theodoro Parma, RN on 09/22/2016 at 9:45 AM EST Informed patient of results and verbal understanding expressed.  Scheduled patient with Ignacia Bayley to discuss results in further detail and catheterization today at 1400. She would like to further discuss ASA at visit - she was told to stop taking it while in the hospital.  Patient was grateful for call. ------  Notes Recorded by Theodoro Parma, RN on 09/22/2016 at 8:44 AM EST Left message to call back. ------  Notes Recorded by Sueanne Margarita, MD on 09/21/2016 at 4:36 PM EST Abnormal coronary CTA with calcium score in 90th% suggesting high risk for future cardiac events. Extensive coronary disease with 50% large PLV branch off the RCA, at least moderate stenosis in distal LM/ostial LAD but could be worse. Please set patient up for left heart cath and possible PCI. Will need to see extender for pre cath workup. Please set up cath for Friday. Start ASA 81mg  daily. ------  Notes Recorded by Sueanne Margarita, MD on 09/21/2016 at 12:30 PM EST Several very small pulmonary nodules noted in lungs and felt to be benign. Please forward this to PCP for followup.

## 2016-09-22 NOTE — Addendum Note (Signed)
Addended by: Murray Hodgkins R on: 09/22/2016 04:34 PM   Modules accepted: Orders, SmartSet

## 2016-09-22 NOTE — Patient Instructions (Signed)
Your physician has requested that you have a left cardiac catheterization tomorrow with Dr Radford Pax. Cardiac catheterization is used to diagnose and/or treat various heart conditions. Doctors may recommend this procedure for a number of different reasons. The most common reason is to evaluate chest pain. Chest pain can be a symptom of coronary artery disease (CAD), and cardiac catheterization can show whether plaque is narrowing or blocking your heart's arteries. This procedure is also used to evaluate the valves, as well as measure the blood flow and oxygen levels in different parts of your heart. For further information please visit [email or web address].   Following your catheterization, you will not be allowed to drive for 3 days.  No lifting, pushing, or pulling greater that 10 pounds is allowed for 1 week.  You will be required to have the following tests prior to the procedure:  1. Blood work - TODAY. The lab is located downstairs on the first floor. Please use the map provided.   Ignacia Bayley, NP recommends that you schedule a follow-up appointment in 2 weeks.

## 2016-09-23 ENCOUNTER — Other Ambulatory Visit: Payer: Medicare Other

## 2016-09-23 ENCOUNTER — Encounter (HOSPITAL_COMMUNITY)
Admission: RE | Disposition: A | Discharge status: Home / Self Care | Payer: Self-pay | Source: Ambulatory Visit | Attending: Internal Medicine

## 2016-09-23 ENCOUNTER — Ambulatory Visit (HOSPITAL_COMMUNITY)
Admission: RE | Admit: 2016-09-23 | Discharge: 2016-09-24 | Disposition: A | Discharge status: Home / Self Care | Payer: Medicare Other | Source: Ambulatory Visit | Attending: Internal Medicine | Admitting: Internal Medicine

## 2016-09-23 ENCOUNTER — Telehealth: Payer: Self-pay | Admitting: *Deleted

## 2016-09-23 ENCOUNTER — Encounter (HOSPITAL_COMMUNITY): Payer: Self-pay | Admitting: Cardiology

## 2016-09-23 DIAGNOSIS — R9439 Abnormal result of other cardiovascular function study: Secondary | ICD-10-CM | POA: Diagnosis not present

## 2016-09-23 DIAGNOSIS — Y92239 Unspecified place in hospital as the place of occurrence of the external cause: Secondary | ICD-10-CM | POA: Insufficient documentation

## 2016-09-23 DIAGNOSIS — R931 Abnormal findings on diagnostic imaging of heart and coronary circulation: Secondary | ICD-10-CM

## 2016-09-23 DIAGNOSIS — I2510 Atherosclerotic heart disease of native coronary artery without angina pectoris: Secondary | ICD-10-CM

## 2016-09-23 DIAGNOSIS — E785 Hyperlipidemia, unspecified: Secondary | ICD-10-CM | POA: Diagnosis not present

## 2016-09-23 DIAGNOSIS — Z7902 Long term (current) use of antithrombotics/antiplatelets: Secondary | ICD-10-CM | POA: Diagnosis not present

## 2016-09-23 DIAGNOSIS — Z8673 Personal history of transient ischemic attack (TIA), and cerebral infarction without residual deficits: Secondary | ICD-10-CM | POA: Diagnosis not present

## 2016-09-23 DIAGNOSIS — I6523 Occlusion and stenosis of bilateral carotid arteries: Secondary | ICD-10-CM | POA: Diagnosis not present

## 2016-09-23 DIAGNOSIS — E119 Type 2 diabetes mellitus without complications: Secondary | ICD-10-CM | POA: Insufficient documentation

## 2016-09-23 DIAGNOSIS — I97610 Postprocedural hemorrhage and hematoma of a circulatory system organ or structure following a cardiac catheterization: Secondary | ICD-10-CM | POA: Diagnosis not present

## 2016-09-23 DIAGNOSIS — Z7984 Long term (current) use of oral hypoglycemic drugs: Secondary | ICD-10-CM | POA: Insufficient documentation

## 2016-09-23 DIAGNOSIS — Z79899 Other long term (current) drug therapy: Secondary | ICD-10-CM | POA: Insufficient documentation

## 2016-09-23 DIAGNOSIS — I10 Essential (primary) hypertension: Secondary | ICD-10-CM | POA: Diagnosis not present

## 2016-09-23 DIAGNOSIS — T148XXA Other injury of unspecified body region, initial encounter: Secondary | ICD-10-CM

## 2016-09-23 HISTORY — PX: CARDIAC CATHETERIZATION: SHX172

## 2016-09-23 LAB — GLUCOSE, CAPILLARY
GLUCOSE-CAPILLARY: 138 mg/dL — AB (ref 65–99)
GLUCOSE-CAPILLARY: 116 mg/dL — AB (ref 65–99)

## 2016-09-23 LAB — POCT ACTIVATED CLOTTING TIME: Activated Clotting Time: 268 seconds

## 2016-09-23 SURGERY — LEFT HEART CATH AND CORONARY ANGIOGRAPHY
Anesthesia: LOCAL

## 2016-09-23 MED ORDER — HEPARIN SODIUM (PORCINE) 1000 UNIT/ML IJ SOLN
INTRAMUSCULAR | Status: AC
  Filled 2016-09-23: qty 1

## 2016-09-23 MED ORDER — ASPIRIN 81 MG PO CHEW
CHEWABLE_TABLET | ORAL | Status: AC
  Filled 2016-09-23: qty 3

## 2016-09-23 MED ORDER — CARVEDILOL 12.5 MG PO TABS
18.7500 mg | ORAL_TABLET | Freq: Two times a day (BID) | ORAL | Status: DC
  Administered 2016-09-23 – 2016-09-24 (×2): 18.75 mg via ORAL
  Filled 2016-09-23 (×2): qty 2

## 2016-09-23 MED ORDER — LIDOCAINE HCL (PF) 1 % IJ SOLN
INTRAMUSCULAR | Status: DC | PRN
  Administered 2016-09-23: 2 mL

## 2016-09-23 MED ORDER — SODIUM CHLORIDE 0.9% FLUSH: 3.0000 mL | Freq: Two times a day (BID) | INTRAVENOUS | Status: DC

## 2016-09-23 MED ORDER — ISOSORBIDE MONONITRATE ER 30 MG PO TB24: 15.0000 mg | ORAL_TABLET | Freq: Every day | ORAL | 5 refills | Status: DC

## 2016-09-23 MED ORDER — IOPAMIDOL (ISOVUE-370) INJECTION 76%
INTRAVENOUS | Status: DC | PRN
  Administered 2016-09-23: 60 mL

## 2016-09-23 MED ORDER — HEPARIN (PORCINE) IN NACL 2-0.9 UNIT/ML-% IJ SOLN
INTRAMUSCULAR | Status: AC
  Filled 2016-09-23: qty 1000

## 2016-09-23 MED ORDER — PRAVASTATIN SODIUM 40 MG PO TABS
40.0000 mg | ORAL_TABLET | Freq: Every day | ORAL | Status: DC
  Administered 2016-09-23: 23:00:00 40 mg via ORAL
  Filled 2016-09-23: qty 1

## 2016-09-23 MED ORDER — SODIUM CHLORIDE 0.9% FLUSH
3.0000 mL | Freq: Two times a day (BID) | INTRAVENOUS | Status: DC
  Administered 2016-09-23: 3 mL via INTRAVENOUS

## 2016-09-23 MED ORDER — FENTANYL CITRATE (PF) 100 MCG/2ML IJ SOLN
INTRAMUSCULAR | Status: DC | PRN
  Administered 2016-09-23: 50 ug via INTRAVENOUS

## 2016-09-23 MED ORDER — ASPIRIN 81 MG PO CHEW
CHEWABLE_TABLET | ORAL | Status: AC
  Administered 2016-09-23: 324 mg via ORAL
  Filled 2016-09-23: qty 1

## 2016-09-23 MED ORDER — SODIUM CHLORIDE 0.9 % IV SOLN: INTRAVENOUS | Status: AC

## 2016-09-23 MED ORDER — OXYCODONE-ACETAMINOPHEN 5-325 MG PO TABS
1.0000 | ORAL_TABLET | Freq: Once | ORAL | Status: AC
  Administered 2016-09-23: 1 via ORAL

## 2016-09-23 MED ORDER — VERAPAMIL HCL 2.5 MG/ML IV SOLN
INTRAVENOUS | Status: AC
  Filled 2016-09-23: qty 2

## 2016-09-23 MED ORDER — ACETAMINOPHEN 325 MG PO TABS: 650.0000 mg | ORAL_TABLET | ORAL | Status: DC | PRN

## 2016-09-23 MED ORDER — SODIUM CHLORIDE 0.9% FLUSH: 3.0000 mL | INTRAVENOUS | Status: DC | PRN

## 2016-09-23 MED ORDER — SODIUM CHLORIDE 0.9 % IV SOLN: 250.0000 mL | INTRAVENOUS | Status: DC | PRN

## 2016-09-23 MED ORDER — NITROGLYCERIN 0.4 MG SL SUBL: 0.4000 mg | SUBLINGUAL_TABLET | SUBLINGUAL | Status: DC | PRN

## 2016-09-23 MED ORDER — ONDANSETRON HCL 4 MG/2ML IJ SOLN
4.0000 mg | Freq: Four times a day (QID) | INTRAMUSCULAR | Status: DC | PRN
  Administered 2016-09-24 (×2): 4 mg via INTRAVENOUS
  Filled 2016-09-23 (×2): qty 2

## 2016-09-23 MED ORDER — FENTANYL CITRATE (PF) 100 MCG/2ML IJ SOLN
INTRAMUSCULAR | Status: AC
  Filled 2016-09-23: qty 2

## 2016-09-23 MED ORDER — METFORMIN HCL 500 MG PO TABS: ORAL_TABLET | ORAL | 0 refills | Status: DC

## 2016-09-23 MED ORDER — HYDRALAZINE HCL 20 MG/ML IJ SOLN
5.0000 mg | INTRAMUSCULAR | Status: DC | PRN
  Administered 2016-09-23: 5 mg via INTRAVENOUS
  Filled 2016-09-23: qty 1

## 2016-09-23 MED ORDER — ADENOSINE 12 MG/4ML IV SOLN
INTRAVENOUS | Status: AC
  Filled 2016-09-23: qty 16

## 2016-09-23 MED ORDER — ASPIRIN 81 MG PO CHEW
324.0000 mg | CHEWABLE_TABLET | Freq: Once | ORAL | Status: AC
  Administered 2016-09-23: 324 mg via ORAL

## 2016-09-23 MED ORDER — OXYCODONE-ACETAMINOPHEN 5-325 MG PO TABS
ORAL_TABLET | ORAL | Status: AC
  Administered 2016-09-23: 1 via ORAL
  Filled 2016-09-23: qty 1

## 2016-09-23 MED ORDER — ASPIRIN 81 MG PO CHEW: 81.0000 mg | CHEWABLE_TABLET | Freq: Every day | ORAL | Status: DC

## 2016-09-23 MED ORDER — IOPAMIDOL (ISOVUE-370) INJECTION 76%
INTRAVENOUS | Status: AC
  Filled 2016-09-23: qty 100

## 2016-09-23 MED ORDER — MIDAZOLAM HCL 2 MG/2ML IJ SOLN
INTRAMUSCULAR | Status: DC | PRN
  Administered 2016-09-23: 1 mg via INTRAVENOUS

## 2016-09-23 MED ORDER — SODIUM CHLORIDE 0.9 % WEIGHT BASED INFUSION: 1.0000 mL/kg/h | INTRAVENOUS | Status: DC

## 2016-09-23 MED ORDER — LIDOCAINE HCL (PF) 1 % IJ SOLN
INTRAMUSCULAR | Status: AC
  Filled 2016-09-23: qty 30

## 2016-09-23 MED ORDER — SODIUM CHLORIDE 0.9 % WEIGHT BASED INFUSION
3.0000 mL/kg/h | INTRAVENOUS | Status: AC
  Administered 2016-09-23: 3 mL/kg/h via INTRAVENOUS

## 2016-09-23 MED ORDER — MIDAZOLAM HCL 2 MG/2ML IJ SOLN
INTRAMUSCULAR | Status: AC
  Filled 2016-09-23: qty 2

## 2016-09-23 MED ORDER — ADENOSINE 6 MG/2ML IV SOLN
INTRAVENOUS | Status: AC
  Filled 2016-09-23: qty 8

## 2016-09-23 MED ORDER — HEPARIN (PORCINE) IN NACL 2-0.9 UNIT/ML-% IJ SOLN
INTRAMUSCULAR | Status: DC | PRN
  Administered 2016-09-23: 14:00:00

## 2016-09-23 MED ORDER — ADENOSINE (DIAGNOSTIC) 140MCG/KG/MIN
INTRAVENOUS | Status: DC | PRN
  Administered 2016-09-23: 140 ug/kg/min via INTRAVENOUS

## 2016-09-23 MED ORDER — HEPARIN SODIUM (PORCINE) 1000 UNIT/ML IJ SOLN
INTRAMUSCULAR | Status: DC | PRN
  Administered 2016-09-23: 1000 [IU] via INTRAVENOUS
  Administered 2016-09-23 (×2): 2500 [IU] via INTRAVENOUS

## 2016-09-23 MED ORDER — VERAPAMIL HCL 2.5 MG/ML IV SOLN
INTRAVENOUS | Status: DC | PRN
  Administered 2016-09-23: 10 mL via INTRA_ARTERIAL

## 2016-09-23 MED ORDER — ASPIRIN 81 MG PO CHEW: 81.0000 mg | CHEWABLE_TABLET | ORAL | Status: DC

## 2016-09-23 MED ORDER — INSULIN ASPART 100 UNIT/ML ~~LOC~~ SOLN
0.0000 [IU] | Freq: Three times a day (TID) | SUBCUTANEOUS | Status: DC
  Administered 2016-09-24: 08:00:00 1 [IU] via SUBCUTANEOUS
  Administered 2016-09-24: 2 [IU] via SUBCUTANEOUS

## 2016-09-23 SURGICAL SUPPLY — 14 items
CATH INFINITI 5FR ANG PIGTAIL (CATHETERS) ×1 IMPLANT
CATH LAUNCHER 5F EBU3.0 (CATHETERS) IMPLANT
CATH OPTITORQUE TIG 4.0 5F (CATHETERS) ×1 IMPLANT
CATHETER LAUNCHER 5F EBU3.0 (CATHETERS) ×2
DEVICE RAD COMP TR BAND LRG (VASCULAR PRODUCTS) ×1 IMPLANT
GLIDESHEATH SLEND SS 6F .021 (SHEATH) ×1 IMPLANT
GUIDEWIRE INQWIRE 1.5J.035X260 (WIRE) IMPLANT
GUIDEWIRE PRESSURE COMET II (WIRE) ×1 IMPLANT
INQWIRE 1.5J .035X260CM (WIRE) ×2
KIT ESSENTIALS PG (KITS) ×1 IMPLANT
KIT HEART LEFT (KITS) ×2 IMPLANT
PACK CARDIAC CATHETERIZATION (CUSTOM PROCEDURE TRAY) ×2 IMPLANT
TRANSDUCER W/STOPCOCK (MISCELLANEOUS) ×2 IMPLANT
TUBING CIL FLEX 10 FLL-RA (TUBING) ×2 IMPLANT

## 2016-09-23 NOTE — Progress Notes (Signed)
Instructed to leave band at 13cc/ Dr. Saunders Revel to come assess pt shortly

## 2016-09-23 NOTE — Progress Notes (Addendum)
Report received from [facility].RN Care assumed at this time. Pt's right hand is edemastous and red in color with scattered peticia.  No bleeding at site. surronding tissue is soft. Bruising at and under TRB site.  Cap refill is less than 3 sec. Pt's hand is elevated.  I called Lurene Shadow  (cath lab supervisor) to come over and access pt's hand. Will continue to monitor.

## 2016-09-23 NOTE — Progress Notes (Signed)
I'm covering for Trish today. Dr. Saunders Revel requested OP CABG consult for this patient next week. Discussed with Ebony Hail at [place] office - they will call pt with appt info. I asked short stay nurse to pass this along to the patient. Essance Gatti PA-C

## 2016-09-23 NOTE — Interval H&P Note (Signed)
History and Physical Interval Note:  09/23/2016 2:10 PM  Sharon Wilson  has presented today for cardiac catheterization, with the diagnosis of abnormal stress test and coronary CT. The various methods of treatment have been discussed with the patient and family. After consideration of risks, benefits and other options for treatment, the patient has consented to  Procedure(s): Left Heart Cath and Coronary Angiography (N/A) as a surgical intervention .  The patient's history has been reviewed, patient examined, no change in status, stable for surgery.  I have reviewed the patient's chart and labs.  Questions were answered to the patient's satisfaction.    Cath Lab Visit (complete for each Cath Lab visit)  Clinical Evaluation Leading to the Procedure:   ACS: No.  Non-ACS:    Anginal Classification: No Symptoms  Anti-ischemic medical therapy: No Therapy  Non-Invasive Test Results: Low-risk stress test findings: cardiac mortality <1%/year  Prior CABG: No previous CABG   Abhijot Straughter

## 2016-09-23 NOTE — H&P (View-Only) (Signed)
Cardiology Clinic Note   Patient Name: Sharon Wilson Date of Encounter: 09/22/2016  Primary Care Provider:  Lottie Dawson, MD Primary Cardiologist:  T. Turner, MD   Patient Profile    75 year old female status post recent stroke with other history including white coat hypertension and diabetes who was noted to have mildly abnormal echo followed by mildly abnormal stress test, and now an abnormal cardiac CT. She presents today to discuss CT results and set up catheterization.  Past Medical History    Past Medical History:  Diagnosis Date  . Abnormal echocardiogram    a.07/2016 Echo: EF 50-55%, basal and mid inferolateral AK.  . Agatston coronary artery calcium score greater than 400    a. 07/2016 Abnl Echo with basal and mid inferolateral AK, EF 50-55%; b. 09/01/2016 Ex MV: EF 46%, hypertensive response, no ST changes, apical thinning with very mild apical ischemia;  c. 09/21/2016 Cardiac CTA: Ca2+ score = 717, 90th percentile. Extensive CAD w/ mod stenosis in distal LM/ostial LAD, 50% RPLV-->rec Cath.  . Anemia    hysterectomy  . CAD (coronary artery disease)    a. 09/01/2016 Ex MV: EF 46%, hypertensive response, no ST changes, apical thinning with very mild apical ischemia;  c. 09/21/2016 Cardiac CTA: Ca2+ score = 717, 90th percentile. Extensive CAD w/ mod stenosis in distal LM/ostial LAD, 50% RPLV-->rec Cath  . Carotid arterial disease w/ left carotid bruit (Sharon Wilson)    a. 07/2016 Carotid U/S: RICA 123456, LICA 123456.  . Diverticulosis   . Hyperlipidemia   . OA (osteoarthritis)   . Stroke (cerebrum) ([place])    a. 07/2016 L facial droop-->Head CT: small area of low attenuation in the right frontoparietal region c/w acute or subacute lacunar infarct; 07/2016 MRI/A brain: acute infarct of right centrum semiovale and possibly posterior limb of right internal capsule. Mild to moderate multifocal narrowing of the MCA arteries w/o occlusion.  . Type II diabetes mellitus ([place])    a.  07/2016 A1c 7.3.  . White coat hypertension    Past Surgical History:  Procedure Laterality Date  . ABDOMINAL HYSTERECTOMY     fibroid bleeding  . carotid dopplers  11/11   per [facility]  . CATARACT EXTRACTION  2011    Allergies  Allergies  Allergen Reactions  . Ace Inhibitors Hives, Swelling and Cough  . Lovastatin Other (See Comments)    Leg cramps  Can take pravastatin  . Norvasc [Amlodipine Besylate]     Light headed and vertigo like  Also may have had hives.     History of Present Illness    75 year old female with the above complex past medical history including anemia, white coat hypertension, and diabetes. She says her history of whitecoat hypertension dates back quite a long time and it is not uncommon for her to have blood pressures in the 170s and 180s when either in a provider's office or in the hospital. At home however pressures typically run in the low 100s to 1 teens.  In September of this year, she was admitted to the hospital with left-sided facial droop. Head CT suggested acute versus subacute lacunar infarct while MRI showed an infarct in the right centrum semi-ovale and possibly posterior limb of the right internal capsule. She had mild to moderate multifocal narrowing of the MCA arteries without occlusion. Echocardiogram during hospitalization showed normal LV function however, she was noted to have basal and mid inferolateral akinesis. She was discharged home on Plavix and statin therapy was subsequently referred  to cardiology because of abnormal echo. She was seen by Sharon Wilson and was asymptomatic from a cardiac standpoint. Given abnormal echo however with risk factors of whitecoat hypertension, diabetes, hyperlipidemia, and recent stroke, she underwent stress testing which showed apical thinning with very mild apical ischemia. In that setting, further evaluation was felt to be warranted. Patient preferred to avoid diagnostic catheterization and cardiac CT angiogram  was just performed on November 15, and revealed a coronary calcium score of 717, placing her in the 90th percentile. She also had suggestion of distal left main/ostial LAD plaquing of less than 50% and an area in the RP LV that was approximately 50% as well. Given these findings, recommendation was made for diagnostic catheterization and Sharon Wilson was contacted today with results and placed on my schedule to discuss. She is here with her husband today. He has multiple questions and we discussed the workup up to this point, indications for proceeding with diagnostic catheterization, risks related to diagnostic catheterization, and role of percutaneous intervention versus bypass grafting if she were found to have significant coronary artery disease. After lengthy discussion extending out to 60 minutes, patient and husband have agreed to proceed with outpatient diagnostic catheterization. Interestingly, patient has no prior history of chest pain or dyspnea and has been doing well since her stroke.   Home Medications    Prior to Admission medications   Medication Sig Start Date End Date Taking? Authorizing Provider  Calcium Carbonate-Vitamin D (OSCAL 500/200 D-3 PO) Take 1 tablet by mouth 2 (two) times daily.    Yes Historical Provider, MD  carvedilol (COREG) 12.5 MG tablet Take 1.5 tablets (18.75 mg total) by mouth 2 (two) times daily with a meal. 07/29/16  Yes Sharon Medin, MD  clopidogrel (PLAVIX) 75 MG tablet Take 1 tablet (75 mg total) by mouth daily. 08/19/16  Yes Sharon Medin, MD  metFORMIN (GLUCOPHAGE) 500 MG tablet TAKE ONE TABLET BY MOUTH IN THE MORNING AND TWO TABS IN THE EVENING 07/08/16  Yes Sharon Medin, MD  pravastatin (PRAVACHOL) 40 MG tablet Take 1 tablet (40 mg total) by mouth daily at 6 PM. 08/19/16  Yes Sharon Medin, MD    Family History    Family History  Problem Relation Age of Onset  . Cancer Mother     breast  . CAD Neg Hx     Social History    Social History    Social History  . Marital status: Married    Spouse name: N/A  . Number of children: N/A  . Years of education: N/A   Occupational History  . Not on file.   Social History Main Topics  . Smoking status: Never Smoker  . Smokeless tobacco: Never Used  . Alcohol use No  . Drug use: No  . Sexual activity: Not on file   Other Topics Concern  . Not on file   Social History Narrative   Lives in [place] with husband.   Retired.   hh of 2   No pets     Review of Systems    General:  No chills, fever, night sweats or weight changes.  Cardiovascular:  No chest pain, dyspnea on exertion, edema, orthopnea, palpitations, paroxysmal nocturnal dyspnea. Dermatological: No rash, lesions/masses Respiratory: No cough, dyspnea Urologic: No hematuria, dysuria Abdominal:   No nausea, vomiting, diarrhea, bright red blood per rectum, melena, or hematemesis Neurologic:  No visual changes, wkns, changes in mental status. All other systems reviewed and are otherwise  negative except as noted above.  Physical Exam    VS:  BP (!) 185/78   Pulse 65   Ht 5\' 2"  (1.575 m)   Wt 124 lb 9.6 oz (56.5 kg)   BMI 22.79 kg/m  , BMI Body mass index is 22.79 kg/m. GEN: Well nourished, well developed, in no acute distress.  HEENT: normal.  Neck: Supple, no JVD, + L carotid bruits, no masses. Cardiac: RRR, no murmurs, rubs, or gallops. No clubbing, cyanosis, edema.  Radials/DP/PT 2+ and equal bilaterally.  Respiratory:  Respirations regular and unlabored, clear to auscultation bilaterally. GI: Soft, nontender, nondistended, BS + x 4. MS: no deformity or atrophy. Skin: warm and dry, no rash. Neuro:  Strength and sensation are intact. Psych: Normal affect.  Accessory Clinical Findings    ECG - Regular sinus rhythm, 65 first-degree AV block, left axis, nonspecific ST changes.   Assessment & Plan   1.  Coronary artery disease/coronary artery calcium score of 719: Patient was admitted with stroke in  September 2017 and as part of workup, echocardiogram was performed and showed normal LV function with evidence of basal and mid inferolateral akinesis. This led to cardiology evaluation and stress testing which showed very mild apical ischemia. This was followed with cardiac CT angiography which showed a markedly elevated coronary artery calcium score of 719, placing the patient in the 90th percentile. There is also suggestion of at least moderate distal left main/ostial LAD and RPLV disease. Ms. Barkalow has no prior history of chest pain or dyspnea. Risk factors do include diabetes, white coat hypertension, hyperlipidemia, prior stroke, and carotid vascular disease. I spent an hour with Ms. Karnatz and her husband today discussing her testing up to this point as well as indications for future testing, specifically diagnostic catheterization. All questions were answered. The patient understands that risks include but are not limited to stroke (1 in 1000), death (1 in 61), kidney failure [usually temporary] (1 in 500), bleeding (1 in 200), allergic reaction [possibly serious] (1 in 200), and agrees to proceed. She is scheduled have this performed tomorrow, November 16 with Dr. Saunders Revel.  I will follow-up a CBC, basic metabolic panel, and coagulation studies today.  Chest x-ray was last performed in September with CT of her chest performed yesterday. She remains on Plavix, beta blocker, and statin therapy.   2. Whitecoat hypertension: Blood pressure is elevated today at 185/78. She says this is very common for her. She does check her blood pressure regularly at home and says it typically runs in the low 100s to 1 teens.  I have asked that she continue to follow her blood pressures at home. She is on carvedilol 18.75 mg twice a day. I suspect her pressure runs high more often than she thinks. Pressures elevated at home, I would have a low threshold to plan to add ACE inhibitor therapy given history of diabetes.  3.  Hyperlipidemia: She is on Pravachol therapy. She has not tolerated multiple other statins but is tolerating Pravachol at this dose at this time. LDL was 147 in September. She was not on a statin when that was checked.  4. Recent stroke: She has recovered quite well. She remains on Plavix and statin therapy.  5. Type 2 diabetes mellitus: She will need to hold her metformin in preparation for diagnostic catheterization. This is otherwise managed by primary care.   6. Carotid arterial disease: She does have a left carotid bruit. Carotid ultrasound in September showed 1-39% right internal carotid  artery stenosis with 40-59% left internal carotid artery stenosis. She remains on statin and Plavix therapy.   7. Disposition: Follow-up labs today. Plan diagnostic catheterization tomorrow, November 17. Follow-up with Sharon Wilson in approximately 2-3 weeks.    Murray Hodgkins, NP 09/22/2016, 4:05 PM

## 2016-09-23 NOTE — Progress Notes (Signed)
Pt's (R) radial remains soft.  Pain decreasing after meds. Will closely monitor BP.  If it does not decrease after pain in control Dr. Saunders Revel will order PRN med with his admit orders. . Report called to Gwynne Edinger on [facility]. Pt transported to [facility] by Lurene Shadow, [facility] and myself.  (R) radial stable and soft at handoff.

## 2016-09-23 NOTE — Telephone Encounter (Signed)
Spoke with patient @ 9:19 am regarding appointment 10/06/16 @ 11:00 am with Ellen Henri, PA at the [facility] location.  Patient voiced her understanding.

## 2016-09-23 NOTE — Progress Notes (Signed)
Post-Catheterization Progress Note  Subjective: Patient developed hematoma about right radial arteriotomy site after deflation of TR band.  Hematoma is stable but patient notes throbbing in her thumb and wrist.  She denies paresthesia.  Objective: Temp:  [97.8 F (36.6 C)-97.9 F (36.6 C)] 97.8 F (36.6 C) (11/17 1537) Pulse Rate:  [0-295] 71 (11/17 2015) Resp:  [8-18] 16 (11/17 1640) BP: (148-210)/(60-83) 205/77 (11/17 2015) SpO2:  [0 %-100 %] 100 % (11/17 2015) Weight:  [55.3 kg (122 lb)] 55.3 kg (122 lb) (11/17 1238) Gen: Seated comfortably in recliner. Ext: Right wrist with bruising about TR band.  Area is soft without focal hematoma.  Capillary refill is 2+. Petechiae noted over dorsum of right hand.  Assessment/Plan: 75 y/o woman s/p diagnostic catheterization revealing multivessel CAD, who developed right wrist hematoma following TR band deflation.  Bruising is noted without firm hematoma.  There is no evidence of neurovascular compromise.  TR band remains in place with 3 mL of air.  Given pain and the late hour, we will observe the patient overnight and anticipate discharge tomorrow if wrist appears stable. - Percocet 5/325 x 1 for pain - Hydralazine 5 mg IV Q1 hour PRN SBP > 180 mmHg  Nelva Bush, MD [facility] Pager: [phone]

## 2016-09-23 NOTE — Progress Notes (Signed)
Called to check right radial access site. Present when tr band removed. Hematoma started to from immediately lateral to stick site. Manual pressure immediately held. Then TR band reapplied at 13cc air. Right wrist has ecchymosis present proximal to tr band site. Dr. Saunders Revel notified. Spo2 98% as measured in right thumb.

## 2016-09-23 NOTE — Progress Notes (Signed)
Bottom TRB removed without incident. Bruising under band but soft and without bleeding. Attempted to remove TRB from (R) radial site and hematoma started to form instantly at (R) radial lateral site. Pressure held by Hanley Hays and TRB reapplied with 13cc air. Dr. Saunders Revel notified

## 2016-09-24 ENCOUNTER — Encounter (HOSPITAL_COMMUNITY): Payer: Self-pay | Admitting: *Deleted

## 2016-09-24 ENCOUNTER — Emergency Department (HOSPITAL_COMMUNITY)
Admission: EM | Admit: 2016-09-24 | Discharge: 2016-09-24 | Disposition: A | Discharge status: Home / Self Care | Payer: Medicare Other | Attending: Physician Assistant | Admitting: Physician Assistant

## 2016-09-24 ENCOUNTER — Emergency Department (HOSPITAL_COMMUNITY): Payer: Medicare Other

## 2016-09-24 DIAGNOSIS — Y999 Unspecified external cause status: Secondary | ICD-10-CM | POA: Insufficient documentation

## 2016-09-24 DIAGNOSIS — Y939 Activity, unspecified: Secondary | ICD-10-CM | POA: Insufficient documentation

## 2016-09-24 DIAGNOSIS — E119 Type 2 diabetes mellitus without complications: Secondary | ICD-10-CM | POA: Insufficient documentation

## 2016-09-24 DIAGNOSIS — I10 Essential (primary) hypertension: Secondary | ICD-10-CM

## 2016-09-24 DIAGNOSIS — Z8673 Personal history of transient ischemic attack (TIA), and cerebral infarction without residual deficits: Secondary | ICD-10-CM | POA: Insufficient documentation

## 2016-09-24 DIAGNOSIS — W231XXA Caught, crushed, jammed, or pinched between stationary objects, initial encounter: Secondary | ICD-10-CM | POA: Insufficient documentation

## 2016-09-24 DIAGNOSIS — Z7982 Long term (current) use of aspirin: Secondary | ICD-10-CM | POA: Insufficient documentation

## 2016-09-24 DIAGNOSIS — E7800 Pure hypercholesterolemia, unspecified: Secondary | ICD-10-CM | POA: Diagnosis not present

## 2016-09-24 DIAGNOSIS — Y929 Unspecified place or not applicable: Secondary | ICD-10-CM | POA: Insufficient documentation

## 2016-09-24 DIAGNOSIS — I2510 Atherosclerotic heart disease of native coronary artery without angina pectoris: Secondary | ICD-10-CM | POA: Diagnosis not present

## 2016-09-24 DIAGNOSIS — Z7984 Long term (current) use of oral hypoglycemic drugs: Secondary | ICD-10-CM | POA: Insufficient documentation

## 2016-09-24 DIAGNOSIS — T148XXA Other injury of unspecified body region, initial encounter: Secondary | ICD-10-CM

## 2016-09-24 DIAGNOSIS — S61215A Laceration without foreign body of left ring finger without damage to nail, initial encounter: Secondary | ICD-10-CM | POA: Insufficient documentation

## 2016-09-24 LAB — CBC WITH DIFFERENTIAL/PLATELET
BASOS ABS: 0 10*3/uL (ref 0.0–0.1)
Basophils Relative: 0 %
EOS PCT: 1 %
Eosinophils Absolute: 0.1 10*3/uL (ref 0.0–0.7)
HEMATOCRIT: 36.5 % (ref 36.0–46.0)
Hemoglobin: 12 g/dL (ref 12.0–15.0)
LYMPHS ABS: 0.5 10*3/uL — AB (ref 0.7–4.0)
LYMPHS PCT: 8 %
MCH: 29.6 pg (ref 26.0–34.0)
MCHC: 32.9 g/dL (ref 30.0–36.0)
MCV: 90.1 fL (ref 78.0–100.0)
MONO ABS: 0.3 10*3/uL (ref 0.1–1.0)
MONOS PCT: 4 %
NEUTROS ABS: 5.8 10*3/uL (ref 1.7–7.7)
Neutrophils Relative %: 87 %
Platelets: 173 10*3/uL (ref 150–400)
RBC: 4.05 MIL/uL (ref 3.87–5.11)
RDW: 12.7 % (ref 11.5–15.5)
WBC: 6.7 10*3/uL (ref 4.0–10.5)

## 2016-09-24 LAB — BASIC METABOLIC PANEL
ANION GAP: 9 (ref 5–15)
BUN: 10 mg/dL (ref 6–20)
CALCIUM: 8.8 mg/dL — AB (ref 8.9–10.3)
CO2: 24 mmol/L (ref 22–32)
Chloride: 105 mmol/L (ref 101–111)
Creatinine, Ser: 0.64 mg/dL (ref 0.44–1.00)
GFR calc Af Amer: >60 mL/min (ref >60)
GFR calc non Af Amer: >60 mL/min (ref >60)
GLUCOSE: 180 mg/dL — AB (ref 65–99)
POTASSIUM: 3.8 mmol/L (ref 3.5–5.1)
Sodium: 138 mmol/L (ref 135–145)

## 2016-09-24 LAB — GLUCOSE, CAPILLARY
GLUCOSE-CAPILLARY: 168 mg/dL — AB (ref 65–99)
Glucose-Capillary: 140 mg/dL — ABNORMAL HIGH (ref 65–99)

## 2016-09-24 MED ORDER — NITROGLYCERIN 0.4 MG SL SUBL: 0.4000 mg | SUBLINGUAL_TABLET | SUBLINGUAL | 12 refills | Status: AC | PRN

## 2016-09-24 MED ORDER — ACETAMINOPHEN 325 MG PO TABS: 650.0000 mg | ORAL_TABLET | ORAL | Status: DC | PRN

## 2016-09-24 MED ORDER — LIDOCAINE HCL (PF) 1 % IJ SOLN
5.0000 mL | Freq: Once | INTRAMUSCULAR | Status: AC
  Administered 2016-09-24: 5 mL
  Filled 2016-09-24: qty 5

## 2016-09-24 MED ORDER — CEPHALEXIN 500 MG PO CAPS: 500.0000 mg | ORAL_CAPSULE | Freq: Two times a day (BID) | ORAL | 0 refills | Status: DC

## 2016-09-24 MED ORDER — METFORMIN HCL 500 MG PO TABS: ORAL_TABLET | ORAL | 0 refills | Status: DC

## 2016-09-24 MED ORDER — ASPIRIN EC 81 MG PO TBEC: 81.0000 mg | DELAYED_RELEASE_TABLET | Freq: Every day | ORAL | Status: AC

## 2016-09-24 NOTE — ED Triage Notes (Signed)
Pt was being dc from hospital following heart cath yesterday. Pt was going to sit in wheelchair and accidentally pinched left ring finger, laceration noted to finger tip. Pt reports being on blood thinners, bleeding is controlled at triage.

## 2016-09-24 NOTE — Progress Notes (Signed)
Patient Name: Sharon Wilson Date of Encounter: 09/24/2016  Primary Cardiologist: Dr. Dionisio David Problem List     Principal Problem:   Coronary artery disease involving native coronary artery of native heart without angina pectoris Active Problems:   HLD (hyperlipidemia)   Essential hypertension   Hematoma   Subjective   R arm feels better - much less pain this AM. She does feel nauseated this AM. No chest pain, shortness of breath, dizziness.  Inpatient Medications    Scheduled Meds: . carvedilol  18.75 mg Oral BID WC  . insulin aspart  0-9 Units Subcutaneous TID WC  . pravastatin  40 mg Oral QHS  . sodium chloride flush  3 mL Intravenous Q12H   Continuous Infusions: . sodium chloride 1 mL/kg/hr (09/23/16 1441)   PRN Meds: sodium chloride, sodium chloride, acetaminophen, hydrALAZINE, nitroGLYCERIN, ondansetron (ZOFRAN) IV, sodium chloride flush   Vital Signs    Vitals:   09/24/16 0605 09/24/16 0615 09/24/16 0630 09/24/16 0700  BP: (!) 162/59 (!) 149/55 (!) 154/61 (!) 153/51  Pulse: 75 70 78 68  Resp: 19 10 18 11   Temp: 97 F (36.1 C)   97.9 F (36.6 C)  TempSrc: Oral   Oral  SpO2: 99% 98% 100% 98%  Weight: 110 lb 10.7 oz (50.2 kg)     Height:        Intake/Output Summary (Last 24 hours) at 09/24/16 0800 Last data filed at 09/24/16 0000  Gross per 24 hour  Intake                0 ml  Output              300 ml  Net             -300 ml   Filed Weights   09/23/16 1238 09/24/16 0605  Weight: 122 lb (55.3 kg) 110 lb 10.7 oz (50.2 kg)    Physical Exam    GEN: Well nourished, well developed, in no acute distress.  HEENT: Grossly normal.  Neck:   no JVD  Cardiac: RRR, no murmurs R arm: + large ecchymosis R hand; no hematoma; R hand warm with + radial pulse Respiratory:  Respirations regular and unlabored, clear to auscultation bilaterally. GI: Soft, nontender  Skin: warm and dry Neuro:  Strength and sensation are intact. Psych:  AAOx3.  Normal affect.  Labs    CBC  Recent Labs  09/22/16 1537 09/24/16 0341  WBC 4.5 6.7  NEUTROABS  --  5.8  HGB 12.5 12.0  HCT 36.9 36.5  MCV 89.6 90.1  PLT 202 [id number]   Basic Metabolic Panel  Recent Labs  09/22/16 1537 09/24/16 0341  NA 141 138  K 4.5 3.8  CL 103 105  CO2 25 24  GLUCOSE 103* 180*  BUN 13 10  CREATININE 0.85 0.64  CALCIUM 9.8 8.8*   Liver Function Tests No results for input(s): AST, ALT, ALKPHOS, BILITOT, PROT, ALBUMIN in the last 72 hours. No results for input(s): LIPASE, AMYLASE in the last 72 hours. Cardiac Enzymes No results for input(s): CKTOTAL, CKMB, CKMBINDEX, TROPONINI in the last 72 hours. BNP Invalid input(s): POCBNP D-Dimer No results for input(s): DDIMER in the last 72 hours. Hemoglobin A1C No results for input(s): HGBA1C in the last 72 hours. Fasting Lipid Panel No results for input(s): CHOL, HDL, LDLCALC, TRIG, CHOLHDL, LDLDIRECT in the last 72 hours. Thyroid Function Tests  Recent Labs  09/22/16 1537  TSH 2.24  Telemetry    NSR - Personally Reviewed  ECG    Not done today  Radiology    No results found.  Cardiac Studies   LHC 09/23/16 LAD ostial 60 >> FFR 0.72, distal 70; D1 ostial 70, D2 ostial 50 LCx mid 95; OM1 60; OM2 ostial 70 RCA ostial 30, ; RPLB3 70 Conclusions: 1. Significant three-vessel coronary artery disease, as detailed below. Given hemodynamically significant disease involving the ostial/proximal LAD, this represents high risk disease despite lack of patient's symptoms. 2. Normal left ventricular filling pressure. Recommendations: 1. Outpatient cardiac surgery evaluation for CABG. Though the patient is largely asymptomatic and had a low risk stress test, her three-vessel disease including hemodynamically significant ostial/proximal LAD stenosis places her at high risk for cardiovascular events. 2. Start isosorbide mononitrate 15 mg daily, to help with myocardial oxygen demand and blood  pressure. 3. Start aspirin 81 mg daily and discontinue clopidogrel in anticipation for CABG.    Patient Profile     Sharon Wilson is a 75 y.o. female with recent R brain CVA in 9/17, DM, HL, carotid artery disease and white coat HTN. Echo after her CVA demonstrated inferolateral AK prompting an ischemic workup.  Nuclear stress test demonstrated apical thinning and very mild apical ischemia.  Coronary CTA revealed a coronary calcium score of 717, placing her in the 90th percentile. She also had suggestion of distal left main/ostial LAD plaquing of less than 50% and an area in the RP LV that was approximately 50% as well. She was therefore set up for cardiac catheterization. LHC done 11/17 demonstrates significant 3 V CAD.  Dr. Harrell Gave End recommends OP evaluation for CABG.  Plavix DCd and ASA started.  Isosorbide 15 mg QD started for myocardial oxygen demand and BP.  She stayed overnight due to R radial hematoma.    Assessment & Plan    1. CAD - LHC demonstrates 3 v CAD.  Recommendation is to proceed with CABG and since patient is asymptomatic, plan is for OP consultation with [facility].  Her R arm looks better this AM.  She is not having chest pain.  -  Continue beta-blocker (Coreg), statin (Pravachol), ASA, Isosorbide.  -  FU appt with Lyda Jester, PA-C already scheduled for 10/06/16  -  [facility] will contact patient next week with appointment for OP consult.  -  Ambulate with staff and probable DC home if nausea clears (after seen by MD).  2. Hematoma - Large area of ecchymosis on R hand.  Patient notes this looks better and feels better. No obvious hematoma noted this AM.  Hand is warm and + R radial pulse.   3. HTN - BP borderline elevated.  She has better BPs at home. Plan is to start Isosorbide MN 15 mg QD.   4. HL - Continue stain.  Signed, Richardson Dopp, PA-C  09/24/2016, 8:00 AM

## 2016-09-24 NOTE — ED Provider Notes (Signed)
[facility] DEPT Provider Note   By signing my name below, I, Sharon Wilson, attest that this documentation has been prepared under the direction and in the presence of Sharon Ramus, PA-C. Electronically Signed: Bea Wilson, ED Scribe. 09/24/16. 3:31 PM.    History   Chief Complaint Chief Complaint  Patient presents with  . Finger Injury   The history is provided by the patient and medical records. No language interpreter was used.    HPI Comments:  Sharon Wilson is a 75 y.o. female who presents to the Emergency Department complaining of an injury to the fourth digit of her left hand that occurred PTA. Pt was being discharged from the hospital, sat in a wheelchair and pinched the finger in the chair. She reports associated bleeding that has since resolved. She has not taken anything for pain. She denies modifying factors. She denies numbness, tingling or weakness of the left hand or fingers, nausea, vomiting, fever, chills. Tetanus UTD. She reports being on Plavix and taking three ASA 81 mg yesterday.   Past Medical History:  Diagnosis Date  . Abnormal echocardiogram    a.07/2016 Echo: EF 50-55%, basal and mid inferolateral AK.  . Agatston coronary artery calcium score greater than 400    a. 07/2016 Abnl Echo with basal and mid inferolateral AK, EF 50-55%; b. 09/01/2016 Ex MV: EF 46%, hypertensive response, no ST changes, apical thinning with very mild apical ischemia;  c. 09/21/2016 Cardiac CTA: Ca2+ score = 717, 90th percentile. Extensive CAD w/ mod stenosis in distal LM/ostial LAD, 50% RPLV-->rec Cath.  . Anemia    hysterectomy  . CAD (coronary artery disease)    a. 09/01/2016 Ex MV: EF 46%, hypertensive response, no ST changes, apical thinning with very mild apical ischemia;  c. 09/21/2016 Cardiac CTA: Ca2+ score = 717, 90th percentile. Extensive CAD w/ mod stenosis in distal LM/ostial LAD, 50% RPLV-->rec Cath  . Carotid arterial disease w/ left carotid bruit  ([place])    a. 07/2016 Carotid U/S: RICA 123456, LICA 123456.  . Diverticulosis   . Hyperlipidemia   . OA (osteoarthritis)   . Stroke (cerebrum) ([place])    a. 07/2016 L facial droop-->Head CT: small area of low attenuation in the right frontoparietal region c/w acute or subacute lacunar infarct; 07/2016 MRI/A brain: acute infarct of right centrum semiovale and possibly posterior limb of right internal capsule. Mild to moderate multifocal narrowing of the MCA arteries w/o occlusion.  . Type II diabetes mellitus ([place])    a. 07/2016 A1c 7.3.  Marland Kitchen White coat hypertension     Patient Active Problem List   Diagnosis Date Noted  . Hematoma 09/24/2016  . Abnormal stress test 09/23/2016  . Coronary artery disease involving native coronary artery of native heart without angina pectoris   . Type II diabetes mellitus ([place])   . Hyperlipidemia   . Abnormal echocardiogram 08/22/2016  . Acute ischemic stroke ([place]) 07/20/2016  . Dysarthria 07/20/2016  . Stroke (cerebrum) ([place]) 07/20/2016  . Estrogen deficiency 08/06/2014  . Medicare annual wellness visit, initial 08/06/2014  . Statin intolerance 08/06/2014  . Diabetes mellitus type 2, uncontrolled, without complications ([place]) [id number]  . Chest discomfort 03/17/2014  . Body aches 03/17/2014  . Fatigue 03/17/2014  . Abnormal AST  03/17/2014  . HTN, white coat 12/13/2013  . Visit for preventive health examination 02/08/2013  . White coat hypertension  effect. 06/09/2012  . Allergic reaction caused by a drug 05/18/2012  . Allergy to angiotensin-converting enzyme (ACE) inhibitors 05/18/2012  .  Stressful life event affecting family 02/29/2012  . ADVERSE REACTION TO MEDICATION 11/26/2010  . SKIN LESION, UNCERTAIN SIGNIFICANCE 10/25/2010  . SINUS TACHYCARDIA 08/01/2007  . HLD (hyperlipidemia) 05/01/2007  . DIVERTICULOSIS, COLON 05/01/2007  . COLONIC POLYPS, HX OF 05/01/2007  . Non-insulin dependent type 2 diabetes mellitus ([place]) 03/14/2007  . Essential  hypertension 03/14/2007  . OSTEOARTHRITIS 03/14/2007    Past Surgical History:  Procedure Laterality Date  . ABDOMINAL HYSTERECTOMY     fibroid bleeding  . carotid dopplers  11/11   per [facility]  . CATARACT EXTRACTION  2011    OB History    Gravida Para Term Preterm AB Living   4 4           SAB TAB Ectopic Multiple Live Births                   Home Medications    Prior to Admission medications   Medication Sig Start Date End Date Taking? Authorizing Provider  acetaminophen (TYLENOL) 325 MG tablet Take 2 tablets (650 mg total) by mouth every 4 (four) hours as needed for mild pain or moderate pain. 09/24/16   [name], PA-C  aspirin EC 81 MG tablet Take 1 tablet (81 mg total) by mouth daily. 09/24/16   [name], PA-C  Calcium Carbonate-Vitamin D (OSCAL 500/200 D-3 PO) Take 1 tablet by mouth 2 (two) times daily.     Historical Provider, MD  carvedilol (COREG) 12.5 MG tablet Take 1.5 tablets (18.75 mg total) by mouth 2 (two) times daily with a meal. 07/29/16   [name], MD  cephALEXin (KEFLEX) 500 MG capsule Take 1 capsule (500 mg total) by mouth 2 (two) times daily. 09/24/16   Nona Dell, PA-C  isosorbide mononitrate (IMDUR) 30 MG 24 hr tablet Take 0.5 tablets (15 mg total) by mouth daily. 09/23/16   Nelva Bush, MD  metFORMIN (GLUCOPHAGE) 500 MG tablet TAKE ONE TABLET BY MOUTH IN THE MORNING AND TWO TABS IN THE EVENING 09/26/16   [name], PA-C  nitroGLYCERIN (NITROSTAT) 0.4 MG SL tablet Place 1 tablet (0.4 mg total) under the tongue every 5 (five) minutes x 3 doses as needed for chest pain. 09/24/16   [name], PA-C  pravastatin (PRAVACHOL) 40 MG tablet Take 1 tablet (40 mg total) by mouth daily at 6 PM. 08/19/16   [name], MD    Family History Family History  Problem Relation Age of Onset  . Cancer Mother     breast  . CAD Neg Hx     Social History Social History  Substance Use Topics  . Smoking status: Never  Smoker  . Smokeless tobacco: Never Used  . Alcohol use No     Allergies   Ace inhibitors; Lovastatin; and Norvasc [amlodipine besylate]   Review of Systems Review of Systems  Constitutional: Negative for chills and fever.  Gastrointestinal: Negative for nausea and vomiting.  Skin: Positive for wound.  Allergic/Immunologic: Positive for immunocompromised state.  Neurological: Negative for weakness and numbness.  Hematological: Bruises/bleeds easily.     Physical Exam Updated Vital Signs BP 163/58 (BP Location: Left Arm)   Pulse 70   Temp 98.4 F (36.9 C) (Oral)   Resp 18   SpO2 98%   Physical Exam  Constitutional: She is oriented to person, place, and time. She appears well-developed and well-nourished.  HENT:  Head: Normocephalic and atraumatic.  Eyes: Conjunctivae and EOM are normal. Right eye exhibits  no discharge. Left eye exhibits no discharge. No scleral icterus.  Neck: Normal range of motion. Neck supple.  Cardiovascular: Normal rate and intact distal pulses.   Pulmonary/Chest: Effort normal.  Musculoskeletal: Normal range of motion. She exhibits no edema or tenderness.  Full ROM of fingers of left hand with 5/5 strength. Cap refill less than 2 seconds. Left radial pulse 2+. Sensation grossly intact. Nail grossly intact.  Neurological: She is alert and oriented to person, place, and time.  Skin: Skin is warm and dry. Capillary refill takes less than 2 seconds.  1 cm laceration noted to medial aspect of left ring finger distal phalanx. Slow oozing bleed from wound. No pulsatile bleed noted. Bleeding controlled with compression.  Nursing note and vitals reviewed.    ED Treatments / Results  DIAGNOSTIC STUDIES: Oxygen Saturation is 98% on RA, normal by my interpretation.   COORDINATION OF CARE: 3:23 PM- Will X-Ray left fourth finger. Pt verbalizes understanding and agrees to plan.  Medications  lidocaine (PF) (XYLOCAINE) 1 % injection 5 mL (5 mLs Infiltration  Given 09/24/16 1629)    Labs (all labs ordered are listed, but only abnormal results are displayed) Labs Reviewed - No data to display  EKG  EKG Interpretation None       Radiology Dg Finger Ring Left  Result Date: 09/24/2016 CLINICAL DATA:  Laceration to ring finger EXAM: LEFT RING FINGER 2+V COMPARISON:  None. FINDINGS: Soft tissue swelling over the distal phalanx of the left ring finger. No underlying bony abnormality. No fracture, subluxation or dislocation. IMPRESSION: No acute bony abnormality. Electronically Signed   By: Rolm Baptise M.D.   On: 09/24/2016 16:01    Procedures .Marland KitchenLaceration Repair Date/Time: 09/24/2016 4:48 PM Performed by: Nona Dell Authorized by: Nona Dell   Consent:    Consent obtained:  Verbal   Consent given by:  Patient Anesthesia (see MAR for exact dosages):    Anesthesia method:  Local infiltration   Local anesthetic:  Lidocaine 1% w/o epi Laceration details:    Location:  Finger   Finger location:  L ring finger   Length (cm):  1 Repair type:    Repair type:  Simple Pre-procedure details:    Preparation:  Patient was prepped and draped in usual sterile fashion and imaging obtained to evaluate for foreign bodies Exploration:    Wound exploration: wound explored through full range of motion and entire depth of wound probed and visualized     Wound extent: no foreign bodies/material noted, no muscle damage noted, no nerve damage noted, no tendon damage noted, no underlying fracture noted and no vascular damage noted     Contaminated: no   Treatment:    Area cleansed with:  Betadine and saline   Amount of cleaning:  Standard   Irrigation solution:  Sterile saline   Irrigation method:  Syringe   Visualized foreign bodies/material removed: no   Skin repair:    Repair method:  Sutures   Suture size:  5-0   Suture material:  Prolene   Suture technique:  Simple interrupted   Number of sutures:   6 Approximation:    Approximation:  Close   Vermilion border: well-aligned   Post-procedure details:    Dressing:  Non-adherent dressing   Patient tolerance of procedure:  Tolerated well, no immediate complications   (including critical care time)  Medications Ordered in ED Medications  lidocaine (PF) (XYLOCAINE) 1 % injection 5 mL (5 mLs Infiltration Given 09/24/16 1629)  Initial Impression / Assessment and Plan / ED Course  I have reviewed the triage vital signs and the nursing notes.  Pertinent labs & imaging results that were available during my care of the patient were reviewed by me and considered in my medical decision making (see chart for details).  Clinical Course     Pressure irrigation performed. Wound explored and base of wound visualized in a bloodless field without evidence of foreign body.  Laceration occurred < 8 hours prior to repair which was well tolerated. Tdap UTD.  Pt has comorbidities, hx of DM, to effect normal wound healing. Pt discharged with antibiotics.  Discussed suture home care with patient and answered questions. Pt to follow-up for wound check and suture removal in 7 days; they are to return to the ED sooner for signs of infection. Pt is hemodynamically stable with no complaints prior to dc.    I personally performed the services described in this documentation, which was scribed in my presence. The recorded information has been reviewed and is accurate.   Final Clinical Impressions(s) / ED Diagnoses   Final diagnoses:  Laceration of left ring finger without foreign body without damage to nail, initial encounter    New Prescriptions New Prescriptions   CEPHALEXIN (KEFLEX) 500 MG CAPSULE    Take 1 capsule (500 mg total) by mouth 2 (two) times daily.     Chesley Noon [place], [place] 09/24/16 [place], MD 09/24/16 2317

## 2016-09-24 NOTE — Discharge Summary (Signed)
Discharge Summary    Patient ID: Sharon Wilson,  MRN: [id number], DOB/AGE: Oct 23, 1941 75 y.o.  Admit date: 09/23/2016 Discharge date: 09/24/2016  Primary Care Provider: Lottie Dawson Primary Cardiologist: Dr. Fransico Him   Discharge Diagnoses    Principal Problem:   Coronary artery disease involving native coronary artery of native heart without angina pectoris Active Problems:   HLD (hyperlipidemia)   Essential hypertension   Hematoma   Allergies Allergies  Allergen Reactions  . Ace Inhibitors Hives, Swelling and Cough  . Lovastatin Other (See Comments)    Leg cramps  Can take pravastatin  . Norvasc [Amlodipine Besylate]     Light headed and vertigo like  Also may have had hives.     Diagnostic Studies/Procedures    LHC 09/23/16 LAD ostial 60 >> FFR 0.72, distal 70; D1 ostial 70, D2 ostial 50 LCx mid 95; OM1 60; OM2 ostial 70 RCA ostial 30, ; RPLB3 70 Conclusions: 1. Significant three-vessel coronary artery disease, as detailed below. Given hemodynamically significant disease involving the ostial/proximal LAD, this represents high risk disease despite lack of patient's symptoms. 2. Normal left ventricular filling pressure. Recommendations: 1. Outpatient cardiac surgery evaluation for CABG. Though the patient is largely asymptomatic and had a low risk stress test, her three-vessel disease including hemodynamically significant ostial/proximal LAD stenosis places her at high risk for cardiovascular events. 2. Start isosorbide mononitrate 15 mg daily, to help with myocardial oxygen demand and blood pressure. 3. Start aspirin 81 mg daily and discontinue clopidogrel in anticipation for CABG.  _____________   History of Present Illness     Sharon Wilson is a 75 y.o. female with recent R brain CVA in 9/17, DM, HL, carotid artery disease and white coat HTN. Echo after her CVA demonstrated normal LVF but inferolateral AK prompting an ischemic workup.   Nuclear stress test demonstrated apical thinning and very mild apical ischemia.  Coronary CTA revealed a coronary calcium score of 717, placing her in the 90th percentile. She also had suggestion of distal left main/ostial LAD plaquing of less than 50% and an area in the RP LV that was approximately 50% as well. She was therefore set up for cardiac catheterization.   Hospital Course     Consultants: none    She underwent LHC 09/23/16 by Dr. Harrell Gave End that demonstrated significant 3 V CAD.  Dr. Saunders Revel recommended OP evaluation for CABG as the patient is asymptomatic.  Plavix has been DC'd and ASA started giving pending CABG.  Isosorbide 15 mg QD will be started for myocardial oxygen demand and BP.  She developed R radial hematoma with significant pain and was observed overnight. TR band was applied and she was given Percocet x 1 for pain.  This AM, she is doing better with much less pain. Her hematoma is resolved. She does have a large ecchymosis on her R hand.  She has some mild nausea.  But, this resolved with Zofran. She was evaluated by Dr. Fransico Him this AM and felt to be stable for DC to home if her nausea did not return.  The patient stayed in the hospital and had lunch without further nausea.  She is therefore discharged to home in stable condition.  [facility] will contact her next week to schedule OP consult for CABG.    _____________  Discharge Vitals Blood pressure (!) 153/51, pulse 68, temperature 97.9 F (36.6 C), temperature source Oral, resp. rate (!) 22, height 5\' 2"  (1.575 m), weight 110 lb 10.7 oz (  50.2 kg), SpO2 98 %.  Filed Weights   09/23/16 1238 09/24/16 0605  Weight: 122 lb (55.3 kg) 110 lb 10.7 oz (50.2 kg)    Labs & Radiologic Studies    CBC  Recent Labs  09/22/16 1537 09/24/16 0341  WBC 4.5 6.7  NEUTROABS  --  5.8  HGB 12.5 12.0  HCT 36.9 36.5  MCV 89.6 90.1  PLT 202 [id number]   Basic Metabolic Panel  Recent Labs  09/22/16 1537 09/24/16 0341  NA 141 138  K  4.5 3.8  CL 103 105  CO2 25 24  GLUCOSE 103* 180*  BUN 13 10  CREATININE 0.85 0.64  CALCIUM 9.8 8.8*   _____________   Disposition   Pt is being discharged home today in good condition.  Follow-up Plans & Appointments     Discharge Instructions    Diet - low sodium heart healthy    Complete by:  As directed    Discharge wound care:    Complete by:  As directed    Call [phone] if you develop more pain in your right wrist/hand, you develop more swelling or there is numbness or tingling.   Driving Restrictions    Complete by:  As directed    None for 2 days   Increase activity slowly    Complete by:  As directed    Lifting restrictions    Complete by:  As directed    No lifting for 1 week (over 5 lbs)   Sexual Activity Restrictions    Complete by:  As directed    None for now      Discharge Medications   Current Discharge Medication List    START taking these medications   Details  acetaminophen (TYLENOL) 325 MG tablet Take 2 tablets (650 mg total) by mouth every 4 (four) hours as needed for mild pain or moderate pain.    aspirin EC 81 MG tablet Take 1 tablet (81 mg total) by mouth daily.    isosorbide mononitrate (IMDUR) 30 MG 24 hr tablet Take 0.5 tablets (15 mg total) by mouth daily. Qty: 30 tablet, Refills: 5    nitroGLYCERIN (NITROSTAT) 0.4 MG SL tablet Place 1 tablet (0.4 mg total) under the tongue every 5 (five) minutes x 3 doses as needed for chest pain. Qty: 25 tablet, Refills: 12      CONTINUE these medications which have CHANGED   Details  metFORMIN (GLUCOPHAGE) 500 MG tablet TAKE ONE TABLET BY MOUTH IN THE MORNING AND TWO TABS IN THE EVENING Qty: 360 tablet, Refills: 0      CONTINUE these medications which have NOT CHANGED   Details  Calcium Carbonate-Vitamin D (OSCAL 500/200 D-3 PO) Take 1 tablet by mouth 2 (two) times daily.     carvedilol (COREG) 12.5 MG tablet Take 1.5 tablets (18.75 mg total) by mouth 2 (two) times daily with a  meal. Qty: 270 tablet, Refills: 3    pravastatin (PRAVACHOL) 40 MG tablet Take 1 tablet (40 mg total) by mouth daily at 6 PM. Qty: 90 tablet, Refills: 1      STOP taking these medications     clopidogrel (PLAVIX) 75 MG tablet           Outstanding Labs/Studies   None   Duration of Discharge Encounter   Greater than 30 minutes including physician time.  Signed, Richardson Dopp, PA-C  09/24/2016, 12:33 PM  ADDENDUM: This is a late entry. I was paged yesterday as the patient was leaving  the unit after discharge. She injured her left ring finger on the wheelchair. I came to the floor to examine her. She had a fairly large laceration at the tip of her left ring finger with some involvement of the nail bed. There was no apparent compromise to her tendons. I asked for a safety zone portal to be completed by nursing. Given the nature of her injury, I sent her to the emergency room for definitive treatment (i.e. Steri-Strips versus sutures). Richardson Dopp, PA-C   09/25/2016 7:47 AM

## 2016-09-24 NOTE — Discharge Instructions (Signed)
DO NOT TAKE METFORMIN UNTIL Monday 09/26/16.  If you do not hear from the surgeon's office by Monday afternoon (09/26/16), call Dr. Theodosia Blender office - [phone].   Radial Site Care Introduction Refer to this sheet in the next few weeks. These instructions provide you with information about caring for yourself after your procedure. Your health care provider may also give you more specific instructions. Your treatment has been planned according to current medical practices, but problems sometimes occur. Call your health care provider if you have any problems or questions after your procedure. What can I expect after the procedure? After your procedure, it is typical to have the following:  Bruising at the radial site that usually fades within 1-2 weeks.  Blood collecting in the tissue (hematoma) that may be painful to the touch. It should usually decrease in size and tenderness within 1-2 weeks. Follow these instructions at home:  Take medicines only as directed by your health care provider.  You may shower 24-48 hours after the procedure or as directed by your health care provider. Remove the bandage (dressing) and gently wash the site with plain soap and water. Pat the area dry with a clean towel. Do not rub the site, because this may cause bleeding.  Do not take baths, swim, or use a hot tub until your health care provider approves.  Check your insertion site every day for redness, swelling, or drainage.  Do not apply powder or lotion to the site.  Do not flex or bend the affected arm for 24 hours or as directed by your health care provider.  Do not push or pull heavy objects with the affected arm for 24 hours or as directed by your health care provider.  Do not lift over 10 lb (4.5 kg) for 5 days after your procedure or as directed by your health care provider.  Ask your health care provider when it is okay to:  Return to work or school.  Resume usual physical activities or  sports.  Resume sexual activity.  Do not drive home if you are discharged the same day as the procedure. Have someone else drive you.  You may drive 24 hours after the procedure unless otherwise instructed by your health care provider.  Do not operate machinery or power tools for 24 hours after the procedure.  If your procedure was done as an outpatient procedure, which means that you went home the same day as your procedure, a responsible adult should be with you for the first 24 hours after you arrive home.  Keep all follow-up visits as directed by your health care provider. This is important. Contact a health care provider if:  You have a fever.  You have chills.  You have increased bleeding from the radial site. Hold pressure on the site. Get help right away if:  You have unusual pain at the radial site.  You have redness, warmth, or swelling at the radial site.  You have drainage (other than a small amount of blood on the dressing) from the radial site.  The radial site is bleeding, and the bleeding does not stop after 30 minutes of holding steady pressure on the site.  Your arm or hand becomes pale, cool, tingly, or numb. This information is not intended to replace advice given to you by your health care provider. Make sure you discuss any questions you have with your health care provider. Document Released: 11/26/2010 Document Revised: 03/31/2016 Document Reviewed: 05/12/2014  2017 Elsevier

## 2016-09-24 NOTE — Progress Notes (Signed)
Pt is discharged & ready to transport when called by the NT in Rm 6C08 & seen pt in the wheelchair with left ring finger bleeding & laceration  noted. Cleanse  wound with NS & pressure dressing applied  Cold compress applied. Bleeding controlled. As per pt states she put her hand first  & sit on it in the wheelchair. V/S stable. PA Richardson Dopp informed & examined pt & recommended ED MD to evaluate for treatment. [facility] Kris informed. Pt brought  to ED by rapid response.

## 2016-09-24 NOTE — Progress Notes (Signed)
Called by RN on 6 C for assistance with bringing patient to Ed.  Patient has been discharged and while she was getting into wheelchair, got her left ring finger pinched and now has a laceration.  MD was called and recommended that patient go to ED for evaluation.Brought patient to ED to get registered

## 2016-09-24 NOTE — Progress Notes (Signed)
Patient vomited liquid emesis 100cc, still complains of nausea.  Gave Zofran.  Right hand swollen, rewrapped ace wrap to include more of the hand.  Will continue to monitor.

## 2016-09-24 NOTE — Discharge Instructions (Signed)
Keep wound clean using Dial antibacterial supple and water, pat dry. You may apply a small amount of Neosporin ointment to wound daily. You may take Tylenol as prescribed over-the-counter as needed for pain relief. Follow-up with your primary care provider in 7 days for suture removal. Please return to the Emergency Department if symptoms worsen or new onset of fever, redness, swelling, warmth, drainage, numbness, tingling, decreased range of motion.

## 2016-09-24 NOTE — ED Triage Notes (Signed)
PT reports taking blood thinners and had a heart cath on Friday.

## 2016-09-26 ENCOUNTER — Encounter (HOSPITAL_COMMUNITY): Payer: Self-pay | Admitting: Internal Medicine

## 2016-09-30 ENCOUNTER — Encounter: Payer: Medicare Other | Admitting: Internal Medicine

## 2016-10-01 ENCOUNTER — Emergency Department (HOSPITAL_COMMUNITY)
Admission: EM | Admit: 2016-10-01 | Discharge: 2016-10-01 | Disposition: A | Discharge status: Home / Self Care | Payer: Medicare Other | Attending: Emergency Medicine | Admitting: Emergency Medicine

## 2016-10-01 DIAGNOSIS — I2510 Atherosclerotic heart disease of native coronary artery without angina pectoris: Secondary | ICD-10-CM | POA: Insufficient documentation

## 2016-10-01 DIAGNOSIS — Z7984 Long term (current) use of oral hypoglycemic drugs: Secondary | ICD-10-CM | POA: Diagnosis not present

## 2016-10-01 DIAGNOSIS — E119 Type 2 diabetes mellitus without complications: Secondary | ICD-10-CM | POA: Insufficient documentation

## 2016-10-01 DIAGNOSIS — Z8673 Personal history of transient ischemic attack (TIA), and cerebral infarction without residual deficits: Secondary | ICD-10-CM | POA: Insufficient documentation

## 2016-10-01 DIAGNOSIS — Z79899 Other long term (current) drug therapy: Secondary | ICD-10-CM | POA: Diagnosis not present

## 2016-10-01 DIAGNOSIS — Z7982 Long term (current) use of aspirin: Secondary | ICD-10-CM | POA: Insufficient documentation

## 2016-10-01 DIAGNOSIS — Z4802 Encounter for removal of sutures: Secondary | ICD-10-CM | POA: Insufficient documentation

## 2016-10-01 DIAGNOSIS — I10 Essential (primary) hypertension: Secondary | ICD-10-CM | POA: Diagnosis not present

## 2016-10-01 NOTE — Discharge Instructions (Signed)
Contact a health care provider if: °You have increasing redness, swelling, or pain in the wound. °You see pus coming from the wound. °You have a fever. °You notice a bad smell coming from the wound or dressing. °Your wound breaks open (edges not staying together). °

## 2016-10-01 NOTE — ED Triage Notes (Signed)
States her Left ring finger is due for suture removal but still very sore/tender. No drainage noted; denies redness.

## 2016-10-01 NOTE — ED Provider Notes (Signed)
[facility] DEPT Provider Note   CSN: [id number] Arrival date & time: 10/01/16  [id number]   By signing my name below, I, Sharon Wilson, attest that this documentation has been prepared under the direction and in the presence of Margarita Mail, PA-C. Electronically Signed: Eunice Wilson, Scribe. 10/01/16. 10:57 AM.   History   Chief Complaint Chief Complaint  Patient presents with  . Wound Check   The history is provided by the patient. No language interpreter was used.   HPI Comments: Sharon Wilson is a 75 y.o. female who presents to the Emergency Department complaining of left ring finger pain x 1 week. The pt states that she sustained a laceration to it after discharge from the [facility] ED. The pt further reports that she has been applying neosporin to the affected area. She notes mild soreness to the affected finger currently.     Past Medical History:  Diagnosis Date  . Abnormal echocardiogram    a.07/2016 Echo: EF 50-55%, basal and mid inferolateral AK.  . [place] coronary artery calcium score greater than 400    a. 07/2016 Abnl Echo with basal and mid inferolateral AK, EF 50-55%; b. 09/01/2016 Ex MV: EF 46%, hypertensive response, no ST changes, apical thinning with very mild apical ischemia;  c. 09/21/2016 Cardiac CTA: Ca2+ score = 717, 90th percentile. Extensive CAD w/ mod stenosis in distal LM/ostial LAD, 50% RPLV-->rec Cath.  . Anemia    hysterectomy  . CAD (coronary artery disease)    a. 09/01/2016 Ex MV: EF 46%, hypertensive response, no ST changes, apical thinning with very mild apical ischemia;  c. 09/21/2016 Cardiac CTA: Ca2+ score = 717, 90th percentile. Extensive CAD w/ mod stenosis in distal LM/ostial LAD, 50% RPLV-->rec Cath  . Carotid arterial disease w/ left carotid bruit ([name])    a. 07/2016 Carotid U/S: RICA 123456, LICA 123456.  . Diverticulosis   . Hyperlipidemia   . OA (osteoarthritis)   . Stroke (cerebrum) ([name])    a. 07/2016 L facial droop-->Head CT:  small area of low attenuation in the right frontoparietal region c/w acute or subacute lacunar infarct; 07/2016 MRI/A brain: acute infarct of right centrum semiovale and possibly posterior limb of right internal capsule. Mild to moderate multifocal narrowing of the MCA arteries w/o occlusion.  . Type II diabetes mellitus ([name])    a. 07/2016 A1c 7.3.  Marland Kitchen White coat hypertension     Patient Active Problem List   Diagnosis Date Noted  . Hematoma 09/24/2016  . Abnormal stress test 09/23/2016  . Coronary artery disease involving native coronary artery of native heart without angina pectoris   . Type II diabetes mellitus ([place])   . Hyperlipidemia   . Abnormal echocardiogram 08/22/2016  . Acute ischemic stroke ([name]) 07/20/2016  . Dysarthria 07/20/2016  . Stroke (cerebrum) ([name]) 07/20/2016  . Estrogen deficiency 08/06/2014  . Medicare annual wellness visit, initial 08/06/2014  . Statin intolerance 08/06/2014  . Diabetes mellitus type 2, uncontrolled, without complications ([place]) [id number]  . Chest discomfort 03/17/2014  . Body aches 03/17/2014  . Fatigue 03/17/2014  . Abnormal AST  03/17/2014  . HTN, white coat 12/13/2013  . Visit for preventive health examination 02/08/2013  . White coat hypertension  effect. 06/09/2012  . Allergic reaction caused by a drug 05/18/2012  . Allergy to angiotensin-converting enzyme (ACE) inhibitors 05/18/2012  . Stressful life event affecting family 02/29/2012  . ADVERSE REACTION TO MEDICATION 11/26/2010  . SKIN LESION, UNCERTAIN SIGNIFICANCE 10/25/2010  . SINUS TACHYCARDIA 08/01/2007  .  HLD (hyperlipidemia) 05/01/2007  . DIVERTICULOSIS, COLON 05/01/2007  . COLONIC POLYPS, HX OF 05/01/2007  . Non-insulin dependent type 2 diabetes mellitus ([name]) 03/14/2007  . Essential hypertension 03/14/2007  . OSTEOARTHRITIS 03/14/2007    Past Surgical History:  Procedure Laterality Date  . ABDOMINAL HYSTERECTOMY     fibroid bleeding  . CARDIAC CATHETERIZATION N/A  09/23/2016   Procedure: Left Heart Cath and Coronary Angiography;  Surgeon: Nelva Bush, MD;  Location: [facility];  Service: Cardiovascular;  Laterality: N/A;  . CARDIAC CATHETERIZATION N/A 09/23/2016   Procedure: Intravascular Pressure Wire/FFR Study;  Surgeon: Nelva Bush, MD;  Location: [facility];  Service: Cardiovascular;  Laterality: N/A;  . carotid dopplers  11/11   per [facility] clinic  . CATARACT EXTRACTION  2011    OB History    Gravida Para Term Preterm AB Living   4 4           SAB TAB Ectopic Multiple Live Births                   Home Medications    Prior to Admission medications   Medication Sig Start Date End Date Taking? Authorizing Provider  acetaminophen (TYLENOL) 325 MG tablet Take 2 tablets (650 mg total) by mouth every 4 (four) hours as needed for mild pain or moderate pain. 09/24/16   [name], PA-C  aspirin EC 81 MG tablet Take 1 tablet (81 mg total) by mouth daily. 09/24/16   [name], PA-C  Calcium Carbonate-Vitamin D (OSCAL 500/200 D-3 PO) Take 1 tablet by mouth 2 (two) times daily.     Historical Provider, MD  carvedilol (COREG) 12.5 MG tablet Take 1.5 tablets (18.75 mg total) by mouth 2 (two) times daily with a meal. 07/29/16   [name], MD  cephALEXin (KEFLEX) 500 MG capsule Take 1 capsule (500 mg total) by mouth 2 (two) times daily. 09/24/16   Nona Dell, PA-C  isosorbide mononitrate (IMDUR) 30 MG 24 hr tablet Take 0.5 tablets (15 mg total) by mouth daily. 09/23/16   Nelva Bush, MD  metFORMIN (GLUCOPHAGE) 500 MG tablet TAKE ONE TABLET BY MOUTH IN THE MORNING AND TWO TABS IN THE EVENING 09/26/16   [name], PA-C  nitroGLYCERIN (NITROSTAT) 0.4 MG SL tablet Place 1 tablet (0.4 mg total) under the tongue every 5 (five) minutes x 3 doses as needed for chest pain. 09/24/16   [name], PA-C  pravastatin (PRAVACHOL) 40 MG tablet Take 1 tablet (40 mg total) by mouth daily at 6 PM. 08/19/16   [name], MD    Family History Family History  Problem Relation Age of Onset  . Cancer Mother     breast  . CAD Neg Hx     Social History Social History  Substance Use Topics  . Smoking status: Never Smoker  . Smokeless tobacco: Never Used  . Alcohol use No     Allergies   Ace inhibitors; Lovastatin; and Norvasc [amlodipine besylate]   Review of Systems Review of Systems  Musculoskeletal: Positive for myalgias.  Skin: Positive for wound. Negative for color change.     Physical Exam Updated Vital Signs BP 153/57 (BP Location: Right Arm)   Pulse 66   Temp 97.9 F (36.6 C) (Oral)   Resp 18   Ht 5\' 3"  (1.6 m)   Wt 119 lb (54 kg)   SpO2 100%   BMI 21.08 kg/m   Physical Exam  Constitutional: She is oriented to person, place, and time. She appears well-developed and well-nourished.  HENT:  Head: Normocephalic.  Eyes: EOM are normal.  Neck: Normal range of motion.  Pulmonary/Chest: Effort normal.  Abdominal: She exhibits no distension.  Musculoskeletal: Normal range of motion.       Hands: Neurological: She is alert and oriented to person, place, and time.  Psychiatric: She has a normal mood and affect.  Nursing note and vitals reviewed.    ED Treatments / Results  DIAGNOSTIC STUDIES: Oxygen Saturation is 100% on RA, normal by my interpretation.    COORDINATION OF CARE: 10:48 AM Discussed treatment plan with pt at bedside and pt agreed to plan.   Labs (all labs ordered are listed, but only abnormal results are displayed) Labs Reviewed - No data to display  EKG  EKG Interpretation None       Radiology No results found.  Procedures .Suture Removal Date/Time: 10/01/2016 12:38 PM Performed by: Margarita Mail Authorized by: Margarita Mail   Consent:    Consent obtained:  Verbal   Consent given by:  Patient   Risks discussed:  Bleeding, pain and wound separation Location:    Location:  Upper extremity   Upper extremity location:  Hand    Hand location:  L ring finger Procedure details:    Wound appearance:  No signs of infection   Number of sutures removed:  6 Post-procedure details:    Post-removal:  Dressing applied   Patient tolerance of procedure:  Tolerated well, no immediate complications   (including critical care time)  Medications Ordered in ED Medications - No data to display   Initial Impression / Assessment and Plan / ED Course  I have reviewed the triage vital signs and the nursing notes.  Pertinent labs & imaging results that were available during my care of the patient were reviewed by me and considered in my medical decision making (see chart for details).  Clinical Course     Patient returns for check of suture. The region appears to be well-healing and infection appears to be resolving. Patient symptoms improved from prior visit. Afebrile and hemodynamically stable. Pt is instructed to continue with home care or antibiotics. Pt has a good understanding of return precautions and is safe for discharge at this time.   Final Clinical Impressions(s) / ED Diagnoses   Final diagnoses:  Visit for suture removal    New Prescriptions New Prescriptions   No medications on file  I personally performed the services described in this documentation, which was scribed in my presence. The recorded information has been reviewed and is accurate.        Margarita Mail, PA-C 10/01/16 [place], MD 10/02/16 [phone]

## 2016-10-03 ENCOUNTER — Institutional Professional Consult (permissible substitution) (INDEPENDENT_AMBULATORY_CARE_PROVIDER_SITE_OTHER): Payer: Medicare Other | Admitting: Cardiothoracic Surgery

## 2016-10-03 ENCOUNTER — Telehealth: Payer: Self-pay | Admitting: Internal Medicine

## 2016-10-03 ENCOUNTER — Encounter: Payer: Self-pay | Admitting: Cardiothoracic Surgery

## 2016-10-03 VITALS — BP 157/77 | HR 69 | Resp 20 | Ht 63.0 in | Wt 119.0 lb

## 2016-10-03 DIAGNOSIS — I2510 Atherosclerotic heart disease of native coronary artery without angina pectoris: Secondary | ICD-10-CM | POA: Diagnosis not present

## 2016-10-03 MED ORDER — CARVEDILOL 12.5 MG PO TABS: 18.7500 mg | ORAL_TABLET | Freq: Two times a day (BID) | ORAL | 0 refills | Status: DC

## 2016-10-03 NOTE — Telephone Encounter (Signed)
Pt need new Rx for carvedilol  Pharm:  [facility]

## 2016-10-03 NOTE — Progress Notes (Addendum)
[address]       ,[place] [address]             [phone]                    Arnecia W Amberg [place] Medical Record [id number] Date of Birth: 1941/06/24  Referring: Nelva Bush, MD Primary Care: Lottie Dawson, MD  Chief Complaint:    Chief Complaint  Patient presents with  . Coronary Artery Disease    Surgical eval, Cardiac Cath 09/23/16, ECHO 07/22/16    History of Present Illness:    Sharon Wilson 75 y.o. female is seen in the office  today for consideration of coronary artery bypass grafting. 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 She noted several day progression of weakness and difficulty with her speech and vision in her left eye and weakness in her left hand. She was ultimately admitted approximately 3 days to , and has been having home physical therapy to assist in her stroke recovery. During her evaluation and echocardiogram was done in September with suggested an area of inferior hypokinesis. Also leading to follow-up with cardiology after discharge with a CT of the chest and then a cardiac catheterization on 11/17. Stress test was done which showed no EKG changes and was rated as "low probability". She did have trouble after the heart catheterization with bleeding into the right forearm, then lacerated her left finger requiring sutures on the wheelchair she was being discharged.  The patient has slowly increased her physical activity as she recovers from her stroke, she denies any shortness of breath, she denies angina, she denies PND.      Current Activity/ Functional Status:  Patient is independent with mobility/ambulation, transfers, ADL's, IADL's.   Zubrod Score: At the time of surgery this patient's most appropriate activity status/level should be described as: []     0    Normal activity, no symptoms [x]     1    Restricted in physical strenuous activity but  ambulatory, able to do out light work []     2    Ambulatory and capable of self care, unable to do work activities, up and about               >50 % of waking hours                              []     3    Only limited self care, in bed greater than 50% of waking hours []     4    Completely disabled, no self care, confined to bed or chair []     5    Moribund   Past Medical History:  Diagnosis Date  . Abnormal echocardiogram    a.07/2016 Echo: EF 50-55%, basal and mid inferolateral AK.  . Agatston coronary artery calcium score greater than 400    a. 07/2016 Abnl Echo with basal and mid inferolateral AK, EF 50-55%; b. 09/01/2016 Ex MV: EF 46%, hypertensive response, no ST changes, apical thinning with very mild apical ischemia;  c. 09/21/2016 Cardiac CTA: Ca2+ score = 717, 90th percentile. Extensive CAD w/ mod stenosis in distal LM/ostial LAD, 50% RPLV-->rec Cath.  . Anemia    hysterectomy  . CAD (coronary artery disease)    a. 09/01/2016 Ex MV: EF 46%, hypertensive response, no ST changes,  apical thinning with very mild apical ischemia;  c. 09/21/2016 Cardiac CTA: Ca2+ score = 717, 90th percentile. Extensive CAD w/ mod stenosis in distal LM/ostial LAD, 50% RPLV-->rec Cath  . Carotid arterial disease w/ left carotid bruit ([place])    a. 07/2016 Carotid U/S: RICA 123456, LICA 123456.  . Diverticulosis   . Hyperlipidemia   . OA (osteoarthritis)   . Stroke (cerebrum) ([place])    a. 07/2016 L facial droop-->Head CT: small area of low attenuation in the right frontoparietal region c/w acute or subacute lacunar infarct; 07/2016 MRI/A brain: acute infarct of right centrum semiovale and possibly posterior limb of right internal capsule. Mild to moderate multifocal narrowing of the MCA arteries w/o occlusion.  . Type II diabetes mellitus ([place])    a. 07/2016 A1c 7.3.  . White coat hypertension     Past Surgical History:  Procedure Laterality Date  . ABDOMINAL HYSTERECTOMY     fibroid bleeding  . CARDIAC  CATHETERIZATION N/A 09/23/2016   Procedure: Left Heart Cath and Coronary Angiography;  Surgeon: Nelva Bush, MD;  Location: [facility];  Service: Cardiovascular;  Laterality: N/A;  . CARDIAC CATHETERIZATION N/A 09/23/2016   Procedure: Intravascular Pressure Wire/FFR Study;  Surgeon: Nelva Bush, MD;  Location: [facility];  Service: Cardiovascular;  Laterality: N/A;  . carotid dopplers  11/11   per [facility] clinic  . CATARACT EXTRACTION  2011    Family History  Problem Relation Age of Onset  . Cancer Mother     breast  . CAD Neg Hx     Social History   Social History  . Marital status: Married    Spouse name: N/A  . Number of children: N/A  . Years of education: N/A   Occupational History  . Not on file.   Social History Main Topics  . Smoking status: Never Smoker  . Smokeless tobacco: Never Used  . Alcohol use No  . Drug use: No  . Sexual activity: Not on file   Other Topics Concern  . Not on file   Social History Narrative   Lives in [place] with husband.   Retired.   hh of 2   No pets    History  Smoking Status  . Never Smoker  Smokeless Tobacco  . Never Used    History  Alcohol Use No     Allergies  Allergen Reactions  . Ace Inhibitors Hives, Swelling and Cough  . Lovastatin Other (See Comments)    Leg cramps  Can take pravastatin  . Norvasc [Amlodipine Besylate]     Light headed and vertigo like  Also may have had hives.     Current Outpatient Prescriptions  Medication Sig Dispense Refill  . acetaminophen (TYLENOL) 325 MG tablet Take 2 tablets (650 mg total) by mouth every 4 (four) hours as needed for mild pain or moderate pain.    Marland Kitchen aspirin EC 81 MG tablet Take 1 tablet (81 mg total) by mouth daily.    . Calcium Carbonate-Vitamin D (OSCAL 500/200 D-3 PO) Take 1 tablet by mouth 2 (two) times daily.     . carvedilol (COREG) 12.5 MG tablet Take 1.5 tablets (18.75 mg total) by mouth 2 (two) times daily with a meal. 270 tablet 0  .  isosorbide mononitrate (IMDUR) 30 MG 24 hr tablet Take 0.5 tablets (15 mg total) by mouth daily. 30 tablet 5  . metFORMIN (GLUCOPHAGE) 500 MG tablet TAKE ONE TABLET BY MOUTH IN THE MORNING AND TWO  TABS IN THE EVENING 360 tablet 0  . nitroGLYCERIN (NITROSTAT) 0.4 MG SL tablet Place 1 tablet (0.4 mg total) under the tongue every 5 (five) minutes x 3 doses as needed for chest pain. 25 tablet 12  . pravastatin (PRAVACHOL) 40 MG tablet Take 1 tablet (40 mg total) by mouth daily at 6 PM. 90 tablet 1   No current facility-administered medications for this visit.       Review of Systems:     Cardiac Review of Systems: Y or N  Chest Pain [  N  ]  Resting SOB [ N  ] Exertional SOB  [ N ]  Orthopnea [ N ]   Pedal Edema [ N  ]    Palpitations [ N ] Syncope  Aqua.Slicker  ]   Presyncope [ N  ]  General Review of Systems: [Y] = yes [  ]=no Constitional: recent weight change [ N ];  Wt loss over the last 3 months [   ] anorexia [  ]; fatigue [ Y ]; nausea Aqua.Slicker  ]; night sweats [  ]; fever [  ]; or chills [  ];          Dental: poor dentition[  ]; Last Dentist visit:   Eye : blurred vision [ N ]; diplopia [   ]; vision changes [  ];  Amaurosis fugax[  ]; Resp: cough [  ];  wheezing[N  ];  hemoptysis[N  ]; shortness of breath[N  ]; paroxysmal nocturnal dyspnea[ N ]; dyspnea on exertion[  ]; or orthopnea[  ];  GI:  gallstones[  ], vomiting[ N ];  dysphagia[  ]; melena[  ];  hematochezia [  ]; heartburn[  ];   Hx of  Colonoscopy[  ]; GU: kidney stones [  ]; hematuria[ N ];   dysuria [  ];  nocturia[  ];  history of     obstruction [  ]; urinary frequency [  ]             Skin: rash, swelling[  ];, hair loss[  ];  peripheral edema[  ];  or itching[  ]; Musculosketetal: myalgias[  N];  joint swelling[ N ];  joint erythema[ N ];  joint pain[  ];  back pain[  ];  Heme/Lymph: bruising[  ];  bleeding[  ];  anemia[  ];  Neuro: TIA[  ];  headaches[  ];  stroke[Y  ];  vertigo[ N ];  seizures[N];   paresthesias[  N];  difficulty  walking[ N ];  Psych:depression[  ]; anxiety[  ];  Endocrine: diabetes[ Y ];  thyroid dysfunction[N  ];  Immunizations: Flu up to date Jazmín.Cullens  ]; Pneumococcal up to date [ Y ];  Other:  Physical Exam: BP (!) 157/77   Pulse 69   Resp 20   Ht 5\' 3"  (1.6 m)   Wt 119 lb (54 kg)   SpO2 99% Comment: RA  BMI 21.08 kg/m   PHYSICAL EXAMINATION: General appearance: alert, cooperative, appears older than stated age and no distress Head: Normocephalic, without obvious abnormality, atraumatic Neck: no adenopathy, no right carotid bruit carotid bruit, patient does have a left carotid bruit, no JVD, supple, symmetrical, trachea midline and thyroid not enlarged, symmetric, no tenderness/mass/nodules Lymph nodes: Cervical, supraclavicular, and axillary nodes normal. Resp: clear to auscultation bilaterally Back: symmetric, no curvature. ROM normal. No CVA tenderness. Cardio: regular rate and rhythm, S1, S2 normal, no murmur, click, rub or gallop GI: soft, non-tender;  bowel sounds normal; no masses,  no organomegaly Extremities: extremities normal, atraumatic, no cyanosis or edema and Bruising right forearm, no false aneurysm noted at the radial stick site, left ring finger bandaged from previous laceration  With 6 sutures Neurologic: Alert and oriented X 3, normal strength and tone. Normal symmetric reflexes. Normal coordination and gait     Diagnostic Studies & Laboratory data:     Recent Radiology Findings:   Ct Coronary Morph W/cta Cor W/score W/ca W/cm &/or Wo/cm  Addendum Date: 09/21/2016   ADDENDUM REPORT: 09/21/2016 13:42 CLINICAL DATA:  Chest pain EXAM: Cardiac CTA MEDICATIONS: Sub lingual nitro. 4mg  and lopressor 5mg  IV TECHNIQUE: The patient was scanned on a Philips [id number] slice scanner. Gantry rotation speed was 270 msecs. Collimation was .47mm. A 100 kV prospective scan was triggered in the descending thoracic aorta at 111 HU's with 5% padding centered around 78% of the R-R interval. Average  HR during the scan was 65 bpm. The 3D data set was interpreted on a dedicated work station using MPR, MIP and VRT modes. A total of 80cc of contrast was used. FINDINGS: Non-cardiac: See separate report from [facility]. Calcium Score:  717 Agatston units. Coronary Arteries: Right dominant with no anomalies LM:  Short left main with early bifurcation of LAD and LCx. LAD system: There appears to be at least moderate distal left main/ostial LAD stenosis with mixed plaque. There was diffuse calcified plaque through the proximal and mid LAD with probably < 50% stenosis. The distal LAD appeared small in caliber. Circumflex system: Small OM1 and OM2 without significant disease. Calcified plaque in proximal LCx at OM1, mild stenosis. The distal LCx is small in caliber, cannot rule out significant disease. RCA: Mixed plaque in the proximal RCA with mild stenosis. There was a large PLV branch with a long area of up to 50% stenosis, mixed plaque. Smaller PDA without significant disease. IMPRESSION: 1. Coronary artery calcium score 717 Agatston units, placing the patient in the 90th percentile for age and gender. This suggests high risk for future cardiac events. 2. There is extensive coronary artery disease. The RCA is large and the LCx relatively small. There appears to be 50% stenosis in a large PLV branch off the RCA. Most concerning is the appearance of at least moderate stenosis in the distal left main/ostial LAD. It is possible that this is an overestimate due to blooming artifact from a large amount of calcified plaque, but I think this finding warrants cardiac catheterization. Dalton Mclean Electronically Signed   By: Loralie Champagne M.D.   On: 09/21/2016 13:42   Result Date: 09/21/2016 EXAM: OVER-READ INTERPRETATION  CT CHEST The following report is an over-read performed by radiologist Dr. Rebekah Chesterfield [facility] on 09/21/2016. This over-read does not include interpretation of cardiac or  coronary anatomy or pathology. The coronary calcium score/coronary CTA interpretation by the cardiologist is attached. COMPARISON:  None. FINDINGS: A few scattered tiny 1-4 mm pulmonary nodules are seen in the lungs bilaterally, highly nonspecific but statistically likely benign. Within the visualized portions of the thorax there are no larger more suspicious appearing pulmonary nodules or masses, there is no acute consolidative airspace disease, no pleural effusions, no pneumothorax and no lymphadenopathy. Extensive abdominal aortic atherosclerosis. Remaining visualized portions of the upper abdomen are otherwise unremarkable. There are no aggressive appearing lytic or blastic lesions noted in the visualized portions of the skeleton. IMPRESSION: 1. Several tiny 1-4 mm pulmonary nodules scattered throughout the lungs bilaterally are nonspecific but  favored to be benign. No follow-up needed if patient is low-risk (and has no known or suspected primary neoplasm). Non-contrast chest CT can be considered in 12 months if patient is high-risk. This recommendation follows the consensus statement: Guidelines for Management of Incidental Pulmonary Nodules Detected on CT Images: From the Fleischner Society 2017; Radiology 2017; 284:228-243. Electronically Signed: By: Vinnie Langton M.D. On: 09/21/2016 10:32   Dg Finger Ring Left  Result Date: 09/24/2016 CLINICAL DATA:  Laceration to ring finger EXAM: LEFT RING FINGER 2+V COMPARISON:  None. FINDINGS: Soft tissue swelling over the distal phalanx of the left ring finger. No underlying bony abnormality. No fracture, subluxation or dislocation. IMPRESSION: No acute bony abnormality. Electronically Signed   By: Rolm Baptise M.D.   On: 09/24/2016 16:01     I have independently reviewed the above radiologic studies.  Recent Lab Findings: Lab Results  Component Value Date   WBC 6.7 09/24/2016   HGB 12.0 09/24/2016   HCT 36.5 09/24/2016   PLT 173 09/24/2016   GLUCOSE  180 (H) 09/24/2016   CHOL 238 (H) 07/21/2016   TRIG 294 (H) 07/21/2016   HDL 32 (L) 07/21/2016   LDLDIRECT 136.0 05/05/2016   LDLCALC 147 (H) 07/21/2016   ALT 12 (L) 07/23/2016   AST 19 07/23/2016   NA 138 09/24/2016   K 3.8 09/24/2016   CL 105 09/24/2016   CREATININE 0.64 09/24/2016   BUN 10 09/24/2016   CO2 24 09/24/2016   TSH 2.24 09/22/2016   INR 1.0 09/22/2016   HGBA1C 7.3 (H) 07/21/2016   Stress Test: Study Highlights     Nuclear stress EF: 46%.  Blood pressure demonstrated a hypertensive response to exercise.  There was no ST segment deviation noted during stress.  This is a low risk study.  The left ventricular ejection fraction is mildly decreased (45-54%).   Low risk stress nuclear study with apical thinning and very mild apical ischemia; EF 46 but visually appears normal.   Stress Findings   ECG Baseline ECG exhibits normal sinus rhythm..    Stress Findings The patient exercised following the Bruce protocol.  The patient reported no symptoms during the stress test. The patient experienced no angina during the stress test.   The test was stopped because the patient complained of fatigue.   Heart rate demonstrated a normal response to exercise. Blood pressure demonstrated a hypertensive response to exercise. Overall, the patient's exercise capacity was normal.   Recovery time: 5 minutes. The patient's response to exercise was adequate for diagnosis. Changed to manual. Increased grade and decreased speed. Patient had difficulty keeping up with speed of treadmill.    Response to Stress There was no ST segment deviation noted during stress.  Arrhythmias during stress: rare PVCs.  Arrhythmias during recovery: none.  Arrhythmias were not significant.  ECG was interpretable and there was no significant change from baseline.    Marland Kitchen  CATH: Procedures   Intravascular Pressure Wire/FFR Study  Left Heart Cath and Coronary Angiography  Conclusion    Conclusions: 1. Significant three-vessel coronary artery disease, as detailed below. Given hemodynamically significant disease involving the ostial/proximal LAD, this represents high risk disease despite lack of patient's symptoms. 2. Normal left ventricular filling pressure.  Recommendations: 1. Outpatient cardiac surgery evaluation for CABG. Though the patient is largely asymptomatic and had a low risk stress test, her three-vessel disease including hemodynamically significant ostial/proximal LAD stenosis places her at high risk for cardiovascular events. 2. Start isosorbide mononitrate 15 mg daily,  to help with myocardial oxygen demand and blood pressure. 3. Start aspirin 81 mg daily and discontinue clopidogrel in anticipation for CABG.  Nelva Bush, MD [facility] Pager: [phone]   Coronary Findings   Dominance: Right  Left Main  Vessel is moderate in size. Short LMCA.  Left Anterior Descending  The vessel is moderately tortuous.  Ost LAD lesion, 60% stenosed. The lesion is calcified. Pressure wire/FFR was performed on the lesion. FFR: 0.72.  Dist LAD lesion, 70% stenosed.  First Diagonal Branch  Vessel is moderate in size.  Ost 1st Diag lesion, 70% stenosed.  Second Diagonal Branch  Vessel is moderate in size.  Ost 2nd Diag to 2nd Diag lesion, 50% stenosed.  Left Circumflex  Vessel is moderate in size.  Mid Cx lesion, 95% stenosed.  First Obtuse Marginal Branch  Vessel is small in size.  1st Mrg lesion, 60% stenosed.  Second Obtuse Marginal Branch  Vessel is small in size.  Ost 2nd Mrg lesion, 70% stenosed.  Third Obtuse Marginal Branch  Vessel is small in size.  Right Coronary Artery  Vessel is large.  Ost RCA lesion, 30% stenosed. The lesion is calcified.  Right Posterior Descending Artery  Vessel is small in size.  Ost RPDA lesion, 50% stenosed.  Right Posterior Atrioventricular Branch  Post Atrio lesion, 30% stenosed.  First Right  Posterolateral  Vessel is small in size.  Second Right Posterolateral  Vessel is small in size.  Third Right Posterolateral  Vessel is large in size.  3rd RPLB lesion, 70% stenosed.  Left Heart   Left Ventricle LV end diastolic pressure is normal.    Coronary Diagrams   Diagnostic Diagram      Hemo Data   Flowsheet Row Most Recent Value  AO Systolic Pressure 99991111 mmHg  AO Diastolic Pressure 56 mmHg  AO Mean 95 mmHg  LV Systolic Pressure 123XX123 mmHg  LV Diastolic Pressure 4 mmHg  LV EDP 10 mmHg  Arterial Occlusion Pressure Extended Systolic Pressure 123XX123 mmHg  Arterial Occlusion Pressure Extended Diastolic Pressure 61 mmHg  Arterial Occlusion Pressure Extended Mean Pressure 101 mmHg  Left Ventricular Apex Extended Systolic Pressure 123456 mmHg  Left Ventricular Apex Extended Diastolic Pressure 4 mmHg  Left Ventricular Apex Extended EDP Pressure 12 mmHg      ECHO 07/2016 Transthoracic Echocardiography  Patient:    Sharon Wilson, Sharon Wilson MR #:       [id number] Study Date: 07/22/2016 Gender:     F Age:        35 Height:     160 cm Weight:     58.1 kg BSA:        1.61 m^2 Pt. Status: Room:       5M13C   SONOGRAPHER  Diamond Nickel  ATTENDING    Elgergawy, Silver Huguenin  PERFORMING   [facility], Inpatient  ADMITTING    Opyd, Timothy S  ORDERING     Opyd, Timothy S  REFERRING    Opyd, Timothy S  cc:  ------------------------------------------------------------------- LV EF: 50% -   55%  ------------------------------------------------------------------- Indications:      CVA 4.  ------------------------------------------------------------------- History:   PMH:  White coat hypertension.  Risk factors:  Diabetes mellitus. Dyslipidemia.  ------------------------------------------------------------------- Study Conclusions  - Left ventricle: The cavity size was normal. Systolic function was   normal. The estimated ejection fraction was in the range of 50%   to 55%.  There is akinesis of the basal-midinferolateral   myocardium. Doppler parameters are consistent with abnormal  left   ventricular relaxation (grade 1 diastolic dysfunction). Doppler   parameters are consistent with high ventricular filling pressure. - Left atrium: The atrium was mildly dilated. Volume/bsa, ES   (1-plane Simpson&'s, A4C): 38.3 ml/m^2.  Impressions:  - No cardiac source of emboli was indentified.  ------------------------------------------------------------------- Study data:   Study status:  Routine.  Procedure:  Transthoracic echocardiography. Image quality was adequate.  Study completion: There were no complications.          Transthoracic echocardiography.  M-mode, complete 2D, spectral Doppler, and color Doppler.  Birthdate:  Patient birthdate: [birth year].  Age:  Patient is 74 yr old.  Sex:  Gender: female.    BMI: 22.7 kg/m^2.  Blood pressure:     183/71  Patient status:  Inpatient.  Study date: Study date: 07/22/2016. Study time: 11:22 AM.  Location:  Echo laboratory.  -------------------------------------------------------------------  ------------------------------------------------------------------- Left ventricle:  The cavity size was normal. Systolic function was normal. The estimated ejection fraction was in the range of 50% to 55%.  Regional wall motion abnormalities:   There is akinesis of the basal-midinferolateral myocardium. Doppler parameters are consistent with abnormal left ventricular relaxation (grade 1 diastolic dysfunction). Doppler parameters are consistent with high ventricular filling pressure.  ------------------------------------------------------------------- Aortic valve:   Trileaflet; normal thickness leaflets. Mobility was not restricted.  Doppler:  Transvalvular velocity was within the normal range. There was no stenosis. There was no regurgitation.    ------------------------------------------------------------------- Aorta:  Aortic root: The aortic root was normal in size.  ------------------------------------------------------------------- Mitral valve:   Mildly thickened leaflets . Mobility was not restricted.  Doppler:  Transvalvular velocity was within the normal range. There was no evidence for stenosis. There was no regurgitation.    Peak gradient (D): 3 mm Hg.  ------------------------------------------------------------------- Left atrium:  The atrium was mildly dilated.  ------------------------------------------------------------------- Right ventricle:  The cavity size was normal. Wall thickness was normal. Systolic function was normal.  ------------------------------------------------------------------- Pulmonic valve:   Poorly visualized.  Structurally normal valve. Cusp separation was normal.  Doppler:  Transvalvular velocity was within the normal range. There was no evidence for stenosis. There was no regurgitation.  ------------------------------------------------------------------- Tricuspid valve:   Structurally normal valve.    Doppler: Transvalvular velocity was within the normal range. There was no regurgitation.  ------------------------------------------------------------------- Pulmonary artery:   The main pulmonary artery was normal-sized. Systolic pressure was within the normal range.  ------------------------------------------------------------------- Right atrium:  The atrium was normal in size.  ------------------------------------------------------------------- Pericardium:  There was no pericardial effusion.  ------------------------------------------------------------------- Systemic veins: Inferior vena cava: The vessel was normal in size.  ------------------------------------------------------------------- Measurements   Left ventricle                           Value         Reference  LV ID, ED, PLAX chordal        (L)       39.7  mm     43 - 52  LV ID, ES, PLAX chordal                  29.1  mm     23 - 38  LV fx shortening, PLAX chordal (L)       27    %      >=29  LV PW thickness, ED                      10.8  mm     ---------  IVS/LV PW ratio, ED            (H)       1.41         <=1.3  LV e&', lateral                           6.42  cm/s   ---------  LV E/e&', lateral                         13.6         ---------  LV e&', medial                            4.35  cm/s   ---------  LV E/e&', medial                          20.07        ---------  LV e&', average                           5.39  cm/s   ---------  LV E/e&', average                         16.21        ---------    Ventricular septum                       Value        Reference  IVS thickness, ED                        15.2  mm     ---------    LVOT                                     Value        Reference  LVOT ID, S                               20    mm     ---------  LVOT area                                3.14  cm^2   ---------    Aorta                                    Value        Reference  Aortic root ID, ED                       25    mm     ---------    Left atrium                              Value        Reference  LA ID, A-P, ES  25    mm     ---------  LA ID/bsa, A-P                           1.55  cm/m^2 <=2.2  LA volume, S                             58.9  ml     ---------  LA volume/bsa, S                         36.5  ml/m^2 ---------  LA volume, ES, 1-p A4C                   61.7  ml     ---------  LA volume/bsa, ES, 1-p A4C               38.3  ml/m^2 ---------  LA volume, ES, 1-p A2C                   52.3  ml     ---------  LA volume/bsa, ES, 1-p A2C               32.4  ml/m^2 ---------    Mitral valve                             Value        Reference  Mitral E-wave peak velocity              87.3  cm/s   ---------  Mitral A-wave peak  velocity              114   cm/s   ---------  Mitral deceleration time                 187   ms     150 - 230  Mitral peak gradient, D                  3     mm Hg  ---------  Mitral E/A ratio, peak                   0.8          ---------    Right ventricle                          Value        Reference  RV s&', lateral, S                        9.36  cm/s   ---------  Legend: (L)  and  (H)  mark values outside specified reference range.  ------------------------------------------------------------------- Prepared and Electronically Authenticated by  Candee Furbish, M.D. 2017-09-15T12:12:21   Assessment / Plan:   RECENT STROKE RIGHT HEMISPHERIC unknown etiology, patient recovering currently on aspirin History of diabetes- on metformin approximate 4 years Abnormal echocardiogram leading to cardiac catheterization- the patient has significant disease in the mid to distal circumflex however the distal vessel is probably not bypass was it arises from the AV groove it is very small, she does have disease in her LAD 60-70%, small first obtuse marginal, and 50-60% in the PDA and PL branches of the right-with  the patient's relatively recent stroke, lack of symptoms, negative stress test unless think declined to move quickly to proceed with coronary artery bypass grafting as her risk of stroke would be significantly greater.  I made her appointment to see me back in 3 weeks, the meantime we'll review the films with Dr. Radford Pax and Dr. Saunders Revel to make a final decision about long-term treatment     I  spent 40 minutes counseling the patient face to face and 50% or more the  time was spent in counseling and coordination of care. The total time spent in the appointment was 60 minutes.  Grace Isaac MD      [address] [place] [address] Office [phone]   Beeper [phone]  10/03/2016 3:15 PM

## 2016-10-03 NOTE — Telephone Encounter (Signed)
Sent to the pharmacy by e-scribe for 3 months.  Pt has cpx on 10/11/16.

## 2016-10-06 ENCOUNTER — Other Ambulatory Visit (INDEPENDENT_AMBULATORY_CARE_PROVIDER_SITE_OTHER): Payer: Medicare Other

## 2016-10-06 ENCOUNTER — Ambulatory Visit (INDEPENDENT_AMBULATORY_CARE_PROVIDER_SITE_OTHER): Payer: Medicare Other | Admitting: Cardiology

## 2016-10-06 ENCOUNTER — Encounter: Payer: Self-pay | Admitting: Cardiology

## 2016-10-06 VITALS — BP 142/88 | HR 66 | Ht 63.0 in | Wt 120.5 lb

## 2016-10-06 DIAGNOSIS — E1165 Type 2 diabetes mellitus with hyperglycemia: Secondary | ICD-10-CM | POA: Diagnosis not present

## 2016-10-06 DIAGNOSIS — I10 Essential (primary) hypertension: Secondary | ICD-10-CM | POA: Diagnosis not present

## 2016-10-06 DIAGNOSIS — I2510 Atherosclerotic heart disease of native coronary artery without angina pectoris: Secondary | ICD-10-CM | POA: Diagnosis not present

## 2016-10-06 DIAGNOSIS — IMO0001 Reserved for inherently not codable concepts without codable children: Secondary | ICD-10-CM

## 2016-10-06 DIAGNOSIS — Z0000 Encounter for general adult medical examination without abnormal findings: Secondary | ICD-10-CM | POA: Diagnosis not present

## 2016-10-06 LAB — CBC WITH DIFFERENTIAL/PLATELET
Basophils Absolute: 0 10*3/uL (ref 0.0–0.1)
Basophils Relative: 0.4 % (ref 0.0–3.0)
Eosinophils Absolute: 0.3 10*3/uL (ref 0.0–0.7)
Eosinophils Relative: 7.1 % — ABNORMAL HIGH (ref 0.0–5.0)
HCT: 34.6 % — ABNORMAL LOW (ref 36.0–46.0)
Hemoglobin: 11.7 g/dL — ABNORMAL LOW (ref 12.0–15.0)
Lymphocytes Relative: 20 % (ref 12.0–46.0)
Lymphs Abs: 1 10*3/uL (ref 0.7–4.0)
MCHC: 33.8 g/dL (ref 30.0–36.0)
MCV: 90.1 fl (ref 78.0–100.0)
Monocytes Absolute: 0.4 10*3/uL (ref 0.1–1.0)
Monocytes Relative: 7.6 % (ref 3.0–12.0)
Neutro Abs: 3.1 10*3/uL (ref 1.4–7.7)
Neutrophils Relative %: 64.9 % (ref 43.0–77.0)
Platelets: 224 10*3/uL (ref 150.0–400.0)
RBC: 3.84 Mil/uL — ABNORMAL LOW (ref 3.87–5.11)
RDW: 13.4 % (ref 11.5–15.5)
WBC: 4.9 10*3/uL (ref 4.0–10.5)

## 2016-10-06 LAB — BASIC METABOLIC PANEL
BUN: 13 mg/dL (ref 6–23)
CHLORIDE: 103 meq/L (ref 96–112)
CO2: 32 mEq/L (ref 19–32)
Calcium: 9.3 mg/dL (ref 8.4–10.5)
Creatinine, Ser: 0.92 mg/dL (ref 0.40–1.20)
GFR: 63 mL/min (ref >60.00)
Glucose, Bld: 115 mg/dL — ABNORMAL HIGH (ref 70–99)
POTASSIUM: 4.3 meq/L (ref 3.5–5.1)
SODIUM: 140 meq/L (ref 135–145)

## 2016-10-06 LAB — LIPID PANEL
CHOL/HDL RATIO: 4
CHOLESTEROL: 148 mg/dL (ref 0–200)
HDL: 34.5 mg/dL — ABNORMAL LOW (ref >39.00)
LDL CALC: 87 mg/dL (ref 0–99)
NonHDL: 113.79
TRIGLYCERIDES: 135 mg/dL (ref 0.0–149.0)
VLDL: 27 mg/dL (ref 0.0–40.0)

## 2016-10-06 LAB — HEMOGLOBIN A1C: Hgb A1c MFr Bld: 6.3 % (ref 4.6–6.5)

## 2016-10-06 LAB — HEPATIC FUNCTION PANEL
ALT: 9 U/L (ref 0–35)
AST: 14 U/L (ref 0–37)
Albumin: 4 g/dL (ref 3.5–5.2)
Alkaline Phosphatase: 51 U/L (ref 39–117)
BILIRUBIN DIRECT: 0.1 mg/dL (ref 0.0–0.3)
TOTAL PROTEIN: 6 g/dL (ref 6.0–8.3)
Total Bilirubin: 0.6 mg/dL (ref 0.2–1.2)

## 2016-10-06 LAB — MICROALBUMIN / CREATININE URINE RATIO
Creatinine,U: 202.4 mg/dL
MICROALB UR: 4.3 mg/dL — AB (ref 0.0–1.9)
Microalb Creat Ratio: 2.1 mg/g (ref 0.0–30.0)

## 2016-10-06 LAB — TSH: TSH: 4.15 u[IU]/mL (ref 0.35–4.50)

## 2016-10-06 NOTE — Progress Notes (Signed)
10/06/2016 Sharon Wilson   02/25/1941  [id number]  Primary Physician Lottie Dawson, MD Primary Cardiologist: Dr. Radford Pax    Reason for Visit/CC: F/u for CAD   HPI:  Sharon Wilson presents to clinic today for post hospital f/u. She is a  75 y.o. female with recent R brain CVA in 9/17, DM, HL, carotid artery disease and white coat HTN. Echo after her CVA demonstrated normal LVF but inferolateral AK prompting an ischemic workup.  Nuclear stress test demonstrated apical thinning and very mild apical ischemia.  Coronary CTA revealed a coronary calcium score of 717, placing her in the 90th percentile. She also had suggestion of distal left main/ostial LAD plaquing of less than 50% and an area in the RP LV that was approximately 50% as well. She was therefore set up for cardiac catheterization.   She underwent LHC 09/23/16 by Dr. Harrell Gave End that demonstrated significant 3 V CAD.  Dr. Saunders Revel recommended OP evaluation for CABG as the patient is asymptomatic.  Plavix was DC'd and ASA started giving pending CABG.  Isosorbide 15 mg QD also added for myocardial oxygen demand and BP.  She also developed R radial hematoma post cath but this is improving. She has a 2+ right radial pulse. Her right forearm is stable but no significant pain.   She was seen by Dr. Servando Snare, in his outpatient clinic, on 10/03/16. He reviewed her cath films. Given the patient's relatively recent stroke, lack of symptoms and negative stress test, he has opted to hold off on surgery, for now, as her risk of recurrent stroke would be significantly greater. He has arranged for her to f/u with him in 3 more weeks to reassess. In the meantime, he outlined that he would also further discuss options with Dr. Radford Pax an Dr. Saunders Revel regarding decision about long-term treatment.   She reports that she has done well since discharge. She continues to deny anginal symptoms. No CP, dyspnea, dizziness, syncope/ near syncope. No exertional  symptoms. Her right forearm is improved.   Current Meds  Medication Sig  . acetaminophen (TYLENOL) 325 MG tablet Take 2 tablets (650 mg total) by mouth every 4 (four) hours as needed for mild pain or moderate pain.  Marland Kitchen aspirin EC 81 MG tablet Take 1 tablet (81 mg total) by mouth daily.  . Calcium Carbonate-Vitamin D (OSCAL 500/200 D-3 PO) Take 1 tablet by mouth 2 (two) times daily.   . carvedilol (COREG) 12.5 MG tablet Take 1.5 tablets (18.75 mg total) by mouth 2 (two) times daily with a meal.  . isosorbide mononitrate (IMDUR) 30 MG 24 hr tablet Take 0.5 tablets (15 mg total) by mouth daily.  . metFORMIN (GLUCOPHAGE) 500 MG tablet TAKE ONE TABLET BY MOUTH IN THE MORNING AND TWO TABS IN THE EVENING  . nitroGLYCERIN (NITROSTAT) 0.4 MG SL tablet Place 1 tablet (0.4 mg total) under the tongue every 5 (five) minutes x 3 doses as needed for chest pain.  . pravastatin (PRAVACHOL) 40 MG tablet Take 1 tablet (40 mg total) by mouth daily at 6 PM.   Allergies  Allergen Reactions  . Ace Inhibitors Hives, Swelling and Cough  . Lovastatin Other (See Comments)    Leg cramps  Can take pravastatin  . Norvasc [Amlodipine Besylate]     Light headed and vertigo like  Also may have had hives.    Past Medical History:  Diagnosis Date  . Abnormal echocardiogram    a.07/2016 Echo: EF 50-55%, basal and mid inferolateral AK.  Marland Kitchen  Agatston coronary artery calcium score greater than 400    a. 07/2016 Abnl Echo with basal and mid inferolateral AK, EF 50-55%; b. 09/01/2016 Ex MV: EF 46%, hypertensive response, no ST changes, apical thinning with very mild apical ischemia;  c. 09/21/2016 Cardiac CTA: Ca2+ score = 717, 90th percentile. Extensive CAD w/ mod stenosis in distal LM/ostial LAD, 50% RPLV-->rec Cath.  . Anemia    hysterectomy  . CAD (coronary artery disease)    a. 09/01/2016 Ex MV: EF 46%, hypertensive response, no ST changes, apical thinning with very mild apical ischemia;  c. 09/21/2016 Cardiac CTA: Ca2+ score  = 717, 90th percentile. Extensive CAD w/ mod stenosis in distal LM/ostial LAD, 50% RPLV-->rec Cath  . Carotid arterial disease w/ left carotid bruit ([place])    a. 07/2016 Carotid U/S: RICA 123456, LICA 123456.  . Diverticulosis   . Hyperlipidemia   . OA (osteoarthritis)   . Stroke (cerebrum) ([place])    a. 07/2016 L facial droop-->Head CT: small area of low attenuation in the right frontoparietal region c/w acute or subacute lacunar infarct; 07/2016 MRI/A brain: acute infarct of right centrum semiovale and possibly posterior limb of right internal capsule. Mild to moderate multifocal narrowing of the MCA arteries w/o occlusion.  . Type II diabetes mellitus ([place])    a. 07/2016 A1c 7.3.  . White coat hypertension    Family History  Problem Relation Age of Onset  . Cancer Mother     breast  . CAD Neg Hx    Past Surgical History:  Procedure Laterality Date  . ABDOMINAL HYSTERECTOMY     fibroid bleeding  . CARDIAC CATHETERIZATION N/A 09/23/2016   Procedure: Left Heart Cath and Coronary Angiography;  Surgeon: Nelva Bush, MD;  Location: [facility];  Service: Cardiovascular;  Laterality: N/A;  . CARDIAC CATHETERIZATION N/A 09/23/2016   Procedure: Intravascular Pressure Wire/FFR Study;  Surgeon: Nelva Bush, MD;  Location: [facility];  Service: Cardiovascular;  Laterality: N/A;  . carotid dopplers  11/11   per [facility] clinic  . CATARACT EXTRACTION  2011   Social History   Social History  . Marital status: Married    Spouse name: N/A  . Number of children: N/A  . Years of education: N/A   Occupational History  . Not on file.   Social History Main Topics  . Smoking status: Never Smoker  . Smokeless tobacco: Never Used  . Alcohol use No  . Drug use: No  . Sexual activity: Not on file   Other Topics Concern  . Not on file   Social History Narrative   Lives in [place] with husband.   Retired.   hh of 2   No pets     Review of Systems: General: negative for chills,  fever, night sweats or weight changes.  Cardiovascular: negative for chest pain, dyspnea on exertion, edema, orthopnea, palpitations, paroxysmal nocturnal dyspnea or shortness of breath Dermatological: negative for rash Respiratory: negative for cough or wheezing Urologic: negative for hematuria Abdominal: negative for nausea, vomiting, diarrhea, bright red blood per rectum, melena, or hematemesis Neurologic: negative for visual changes, syncope, or dizziness All other systems reviewed and are otherwise negative except as noted above.   Physical Exam:  Blood pressure (!) 142/88, pulse 66, height 5\' 3"  (1.6 m), weight 120 lb 8 oz (54.7 kg).  General appearance: alert, cooperative and no distress Neck: no carotid bruit and no JVD Lungs: clear to auscultation bilaterally Heart: regular rate and rhythm, S1,  S2 normal, no murmur, click, rub or gallop Extremities: extremities normal, atraumatic, no cyanosis or edema Pulses: 2+ and symmetric Skin: Skin color, texture, turgor normal. No rashes or lesions Neurologic: Grossly normal  EKG NSR. 66 bpm   ASSESSMENT AND PLAN:   1. CAD: 3V CAD noted on recent cath, however patient remains asymptomatic. Patient seen in outpatient consult with Dr. Servando Snare, given her relatively recent stroke, lack of symptoms and negative stress test, he has opted to hold off on surgery, for now, as her risk of recurrent stroke would be significantly greater. He has arranged for her to f/u with him in 3 more weeks to reassess. In the meantime, he outlined that he would also further discuss options with Dr. Radford Pax an Dr. Saunders Revel regarding decision about long-term treatment. Continue medical therapy with ASA, statin and BB.   PLAN  F/u after Dr. Everrett Coombe F/u Visit in 4 weeks.   Lyda Jester PA-C 10/06/2016 12:44 PM

## 2016-10-06 NOTE — Patient Instructions (Signed)
Medication Instructions:  Your physician recommends that you continue on your current medications as directed. Please refer to the Current Medication list given to you today.   Labwork: None   Testing/Procedures: None   Follow-Up: Keep your appointment already scheduled with Dr Servando Snare.        If you need a refill on your cardiac medications before your next appointment, please call your pharmacy.

## 2016-10-10 NOTE — Progress Notes (Signed)
Pre visit review using our clinic review tool, if applicable. No additional management support is needed unless otherwise documented below in the visit note.  Chief Complaint  Patient presents with  . Medicare Wellness    fu meds cva cad  dm ht     HPI: Sharon Wilson 75 y.o. comes in today for Preventive Medicare wellness visit .Since last visit. She ihad full evaluation for her CVA and is found to have coronary artery disease asymptomatic but significant. She has had her medications adjusted including trying to intensify her lipid therapy. She has seen cardiology and neurology. And Cv surgery   .follow up planned to decide elective cabg or other  DiabetesIs really attended to diet and cut out sugars and carbs.  BP 100 115  120   141 when  knows coming . Her readings go up but she has no chest pain shortness of breath. Is having a problem with diarrhea that she considers loose without blood and urgency usually in the evening around bedtime. Otherwise no fever abdominal cramps. Wonders if it could be a medicine. She is now on daily pravastatin and it isn't bothering her legs with aches at this time. Ask cardiology they said to as primary care about what to take. General he is feeling better speech is still a bit impaired. Has an injury to her ring finger and in some bleeding and bruising after her right wrist catheterization on the right. But she is doing physical therapy getting stronger. Has occasional knee pain asks what she should take Tylenol. She is back on aspirin once a day instead of Plavix.  Health Maintenance  Topic Date Due  . FOOT EXAM  10/05/2016  . HEMOGLOBIN A1C  04/05/2017  . OPHTHALMOLOGY EXAM  04/27/2017  . URINE MICROALBUMIN  10/06/2017  . TETANUS/TDAP  08/22/2019  . INFLUENZA VACCINE  Completed  . DEXA SCAN  Completed  . ZOSTAVAX  Completed  . PNA vac Low Risk Adult  Completed   Health Maintenance Review LIFESTYLE:  TAD n Sugar beverages:no Sleep:   Usually well  hh of 2     MEDICARE DOCUMENT QUESTIONS  TO SCAN   Hearing:  ok  Vision:  No limitations at present . Last eye check UTD maybe worse after stroke   Safety:  Has smoke detector and wears seat belts.  No firearms. No excess sun exposure. Sees dentist regularly.  Falls:  no  Advance directive :  Reviewed  Doesn't have a formal 1 at this time but has the paperwork.  Memory: Felt to be good  , no concern from her or her family.  Depression: No anhedonia unusual crying or depressive symptoms  Nutrition: Eats well balanced diet; adequate calcium and vitamin D. No swallowing chewing problems. Speech getting better   Injury: no major injuries in the last six months.  Other healthcare providers:  Reviewed today .  Social:  Lives with spouse married. No pets.   Preventive parameters: up-to-date  Reviewed   ADLS:   There are no problems or need for assistance  driving, feeding, obtaining food, dressing, toileting and bathing, managing money using phone.  But  Limited recnetly casue of  Cath  Stroke  Knee pain and  Left finger laceration  Mild weakness  Left hand  Hard to blow nose from stroke sx  She is independent. Otherwise      ROS:  GEN/ HEENT: No fever, significant weight changes sweats headaches newvision problems hearing changes, CV/ PULM; No chest  pain shortness of breath cough, syncope,edema  change in exercise tolerance. GI /GU: No adominal pain, vomiting, diarrhea as above  No blood in the stool. No significant GU symptoms. SKIN/HEME: ,no acute skin rashes suspicious lesions or bleeding. No lymphadenopathy, nodules, masses.  NEURO/ PSYCH:  Nonew  neurologic signs such as weakness numbness. No depression anxiety. IMM/ Allergy: No unusual infections.  Allergy .   REST of 12 system review negative except as per HPI   Past Medical History:  Diagnosis Date  . Abnormal echocardiogram    a.07/2016 Echo: EF 50-55%, basal and mid inferolateral AK.  . Agatston  coronary artery calcium score greater than 400    a. 07/2016 Abnl Echo with basal and mid inferolateral AK, EF 50-55%; b. 09/01/2016 Ex MV: EF 46%, hypertensive response, no ST changes, apical thinning with very mild apical ischemia;  c. 09/21/2016 Cardiac CTA: Ca2+ score = 717, 90th percentile. Extensive CAD w/ mod stenosis in distal LM/ostial LAD, 50% RPLV-->rec Cath.  . Anemia    hysterectomy  . CAD (coronary artery disease)    a. 09/01/2016 Ex MV: EF 46%, hypertensive response, no ST changes, apical thinning with very mild apical ischemia;  c. 09/21/2016 Cardiac CTA: Ca2+ score = 717, 90th percentile. Extensive CAD w/ mod stenosis in distal LM/ostial LAD, 50% RPLV-->rec Cath  . Carotid arterial disease w/ left carotid bruit ([place])    a. 07/2016 Carotid U/S: RICA 123456, LICA 123456.  . Diverticulosis   . Hyperlipidemia   . OA (osteoarthritis)   . Stroke (cerebrum) ([place])    a. 07/2016 L facial droop-->Head CT: small area of low attenuation in the right frontoparietal region c/w acute or subacute lacunar infarct; 07/2016 MRI/A brain: acute infarct of right centrum semiovale and possibly posterior limb of right internal capsule. Mild to moderate multifocal narrowing of the MCA arteries w/o occlusion.  . Type II diabetes mellitus ([place])    a. 07/2016 A1c 7.3.  . White coat hypertension     Family History  Problem Relation Age of Onset  . Cancer Mother     breast  . CAD Neg Hx     Social History   Social History  . Marital status: Married    Spouse name: N/A  . Number of children: N/A  . Years of education: N/A   Social History Main Topics  . Smoking status: Never Smoker  . Smokeless tobacco: Never Used  . Alcohol use No  . Drug use: No  . Sexual activity: Not Asked   Other Topics Concern  . None   Social History Narrative   Lives in [place] with husband.   Retired.   hh of 2   No pets    Outpatient Encounter Prescriptions as of 10/11/2016  Medication Sig  . acetaminophen  (TYLENOL) 325 MG tablet Take 2 tablets (650 mg total) by mouth every 4 (four) hours as needed for mild pain or moderate pain.  Marland Kitchen aspirin EC 81 MG tablet Take 1 tablet (81 mg total) by mouth daily.  . Calcium Carbonate-Vitamin D (OSCAL 500/200 D-3 PO) Take 1 tablet by mouth 2 (two) times daily.   . carvedilol (COREG) 12.5 MG tablet Take 1.5 tablets (18.75 mg total) by mouth 2 (two) times daily with a meal.  . isosorbide mononitrate (IMDUR) 30 MG 24 hr tablet Take 0.5 tablets (15 mg total) by mouth daily.  . metFORMIN (GLUCOPHAGE) 500 MG tablet TAKE ONE TABLET BY MOUTH IN THE MORNING AND TWO TABS IN THE EVENING  .  nitroGLYCERIN (NITROSTAT) 0.4 MG SL tablet Place 1 tablet (0.4 mg total) under the tongue every 5 (five) minutes x 3 doses as needed for chest pain.  . pravastatin (PRAVACHOL) 40 MG tablet Take 1 tablet (40 mg total) by mouth daily at 6 PM.   No facility-administered encounter medications on file as of 10/11/2016.     EXAM:  BP 138/60 (BP Location: Right Arm, Patient Position: Sitting, Cuff Size: Normal)   Temp 97.9 F (36.6 C) (Oral)   Ht 5' 1.75" (1.568 m)   Wt 120 lb 6.4 oz (54.6 kg)   BMI 22.20 kg/m   Body mass index is 22.2 kg/m.  Physical Exam: Vital signs reviewed [id number] is a well-developed well-nourished alert cooperative   who appears stated age in no acute distress. Very mild dysarthria no drooling cognition appears intact. Band-Aid on the left ring finger healed right calf area right wrist. HEENT: normocephalic atraumatic , Eyes: PERRL EOM's full, conjunctiva clear, wearing glasses. Nares: paten,t no deformity discharge or tenderness., Ears: no deformity EAC's clear TMs with normal landmarks. Mouth: clear OP, no lesions, edema.  Moist mucous membranes. Dentition in adequate repair. NECK: supple without masses, thyromegaly or bruits. CHEST/PULM:  Clear to auscultation and percussion breath sounds equal no wheeze , rales or rhonchi. No chest wall deformities or  tenderness.Breast: normal by inspection . No dimpling, discharge, masses, tenderness or discharge . CV: PMI is nondisplaced, S1 S2 no gallops, murmurs, rubs. Peripheral pulses are full without delay.No JVD .  ABDOMEN: Bowel sounds normal nontender  No guard or rebound, no hepato splenomegal no CVA tenderness.  There is an abdominal bruit midline. Extremtities:  No clubbing cyanosis or edema, no acute joint swelling or redness no focal atrophy NEURO:  Oriented x3, mild dysarthria  ue strength 4-5/5 not tested  Finger injury SKIN: No acute rashes normal turgor, color, no bruising or petechiae. PSYCH: Oriented, good eye contact, no obvious depression anxiety, cognition and judgment appear normal. LN: no cervical axillary inguinal adenopathy No noted deficits in memory, attention,  Diabetic Foot Exam - Simple   Simple Foot Form Diabetic Foot exam was performed with the following findings:  Yes 10/11/2016  2:21 PM  Visual Inspection No deformities, no ulcerations, no other skin breakdown bilaterally:  Yes Sensation Testing Intact to touch and monofilament testing bilaterally:  Yes Pulse Check Posterior Tibialis and Dorsalis pulse intact bilaterally:  Yes Comments      Lab Results  Component Value Date   WBC 4.9 10/06/2016   HGB 11.7 (L) 10/06/2016   HCT 34.6 (L) 10/06/2016   PLT 224.0 10/06/2016   GLUCOSE 115 (H) 10/06/2016   CHOL 148 10/06/2016   TRIG 135.0 10/06/2016   HDL 34.50 (L) 10/06/2016   LDLDIRECT 136.0 05/05/2016   LDLCALC 87 10/06/2016   ALT 9 10/06/2016   AST 14 10/06/2016   NA 140 10/06/2016   K 4.3 10/06/2016   CL 103 10/06/2016   CREATININE 0.92 10/06/2016   BUN 13 10/06/2016   CO2 32 10/06/2016   TSH 4.15 10/06/2016   INR 1.0 09/22/2016   HGBA1C 6.3 10/06/2016   MICROALBUR 4.3 (H) 10/06/2016   BP Readings from Last 3 Encounters:  10/11/16 138/60  10/06/16 (!) 142/88  10/03/16 (!) 157/77   Wt Readings from Last 3 Encounters:  10/11/16 120 lb 6.4 oz (54.6  kg)  10/06/16 120 lb 8 oz (54.7 kg)  10/03/16 119 lb (54 kg)    ASSESSMENT AND PLAN:  Discussed the following assessment and  plan:  Visit for preventive health examination  Non-insulin dependent type 2 diabetes mellitus ([place]) - Improved control.  Late effects of CVA (cerebrovascular accident)  Essential hypertension - w white coat phenom  says 110 120 at home   Medication management  Diarrhea, unspecified type new problem - See text and instructions for plan. Possibly medicine related.  Medicare annual wellness visit, subsequent - advance ddirective HCPOA disc advised  Anemia, mild - new poss from procedures and  asa plavix follw no active bleeding  Patient Care Team: [name], MD as PCP - General Marygrace Drought, MD as Attending Physician (Ophthalmology) Milus Height, MD as Referring Physician (Ophthalmology) Nelva Bush, MD as Consulting Physician (Cardiology) Sueanne Margarita, MD as Consulting Physician (Cardiology)  Patient Instructions  Diarrhea can be from many things often could be from it given medication. The most likely suspicion would be metformin. And not necessarily the Pravachol.  Try taking  Metformin  3 x per day instead of  2 at night .    If diarrhea   Not better then try dec to metformin twice a day .   Can try immodium for diarrhea but could get constipation with this  If taking regular  If still not helpful get back with [place].  Mild anemia  prob from being on the blood thinners    Will follow up in a few months . Ok to take tylenol if needed for knee pain  Blood sugar control is excellent continue healthy diet activity as possible. Plan are OV in 3-4 months or as needed before then.  Glad YOU are doing better   Please  Assign  HCPA    Advanced directive .  Yearly flu vaccine    Standley Brooking. Panosh M.D.

## 2016-10-11 ENCOUNTER — Ambulatory Visit (INDEPENDENT_AMBULATORY_CARE_PROVIDER_SITE_OTHER): Payer: Medicare Other | Admitting: Internal Medicine

## 2016-10-11 ENCOUNTER — Encounter: Payer: Self-pay | Admitting: Internal Medicine

## 2016-10-11 VITALS — BP 138/60 | Temp 97.9°F | Ht 61.75 in | Wt 120.4 lb

## 2016-10-11 DIAGNOSIS — E119 Type 2 diabetes mellitus without complications: Secondary | ICD-10-CM | POA: Diagnosis not present

## 2016-10-11 DIAGNOSIS — R197 Diarrhea, unspecified: Secondary | ICD-10-CM

## 2016-10-11 DIAGNOSIS — Z79899 Other long term (current) drug therapy: Secondary | ICD-10-CM

## 2016-10-11 DIAGNOSIS — D649 Anemia, unspecified: Secondary | ICD-10-CM

## 2016-10-11 DIAGNOSIS — I10 Essential (primary) hypertension: Secondary | ICD-10-CM

## 2016-10-11 DIAGNOSIS — Z0000 Encounter for general adult medical examination without abnormal findings: Secondary | ICD-10-CM

## 2016-10-11 DIAGNOSIS — I6990 Unspecified sequelae of unspecified cerebrovascular disease: Secondary | ICD-10-CM | POA: Diagnosis not present

## 2016-10-11 NOTE — Patient Instructions (Addendum)
Diarrhea can be from many things often could be from it given medication. The most likely suspicion would be metformin. And not necessarily the Pravachol.  Try taking  Metformin  3 x per day instead of  2 at night .    If diarrhea   Not better then try dec to metformin twice a day .   Can try immodium for diarrhea but could get constipation with this  If taking regular  If still not helpful get back with [place].  Mild anemia  prob from being on the blood thinners    Will follow up in a few months . Ok to take tylenol if needed for knee pain  Blood sugar control is excellent continue healthy diet activity as possible. Plan are OV in 3-4 months or as needed before then.  Glad YOU are doing better   Please  Assign  HCPA    Advanced directive .  Yearly flu vaccine

## 2016-10-26 ENCOUNTER — Telehealth: Payer: Self-pay

## 2016-10-26 DIAGNOSIS — E785 Hyperlipidemia, unspecified: Secondary | ICD-10-CM

## 2016-10-26 NOTE — Telephone Encounter (Signed)
Patient is taking Pravastatin 40 mg daily. She started this in September at hospital discharge. She states she has been compliant with therapy. Confirmed her appointment with Dr. Servando Snare tomorrow and scheduled her for assessment with B. [place], [place] 11/11/16. She agrees with treatment plan.

## 2016-10-26 NOTE — Telephone Encounter (Signed)
-----   Message from Sueanne Margarita, MD sent at 10/21/2016  8:55 AM EST ----- Please have patient followup with extender in the next 2-3 weeks to make sure she is doing all right.  Also please find out why she is not on a statin   ----- Message ----- From: Grace Isaac, MD Sent: 10/21/2016   7:27 AM To: Sueanne Margarita, MD  Traci, I do not think this got to your office. I am seeing her next week Ed ----- Message ----- From: Sueanne Margarita, MD Sent: 10/20/2016  10:04 PM To: Grace Isaac, MD  Please have patient followup with extender in the next 2-3 weeks to make sure she is doing all right.  Also please find out why she is not on a statin  Traci ----- Message ----- From: Grace Isaac, MD Sent: 10/20/2016   8:39 AM To: Sueanne Margarita, MD, Nelva Bush, MD  OK thanks, any time we can place between recent stoke and CABG may help. Ed ----- Message ----- From: Nelva Bush, MD Sent: 10/20/2016   8:33 AM To: Sueanne Margarita, MD, Grace Isaac, MD, #  [place],  I wanted to let you know that we reviewed Ms. Allbright's case at cath conference yesterday.  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  If symptoms or concerning stress test features develop, she would likely benefit most from CABG over PCI.  Please let me know if any questions or concerns arise.  Thanks for your help.  Gerald Stabs  ----- Message ----- From: Sueanne Margarita, MD Sent: 10/17/2016  11:27 PM To: Nelva Bush, MD  Thanks [facility]!  Traci ----- Message ----- From: Nelva Bush, MD Sent: 10/17/2016   9:13 AM To: Sueanne Margarita, MD, Grace Isaac, MD  Good morning,  I will try to present Ms. Belknap's films and history at our cath conference on Wednesday to get input from some of our partners.  If either of you  are able to attend, that would be great.  Please let me know if you have any questions or concerns.  Thanks.  Gerald Stabs  ----- Message ----- From: Grace Isaac, MD Sent: 10/03/2016   5:12 PM To: Sueanne Margarita, MD, Nelva Bush, MD  Please see this recent information about our joint patient. Gerald Stabs and Tressia Miners will need to review history and studies with you  before proceeding with CABG  Edward B.  Gerhardt MD [facility] [address]  [place] [address]        [phone] office        [phone] beeper

## 2016-10-27 ENCOUNTER — Ambulatory Visit (INDEPENDENT_AMBULATORY_CARE_PROVIDER_SITE_OTHER): Payer: Medicare Other | Admitting: Cardiothoracic Surgery

## 2016-10-27 ENCOUNTER — Encounter: Payer: Self-pay | Admitting: Cardiothoracic Surgery

## 2016-10-27 VITALS — BP 157/75 | HR 72 | Resp 20 | Ht 62.0 in | Wt 120.0 lb

## 2016-10-27 DIAGNOSIS — I2510 Atherosclerotic heart disease of native coronary artery without angina pectoris: Secondary | ICD-10-CM | POA: Diagnosis not present

## 2016-10-27 DIAGNOSIS — I639 Cerebral infarction, unspecified: Secondary | ICD-10-CM

## 2016-10-27 NOTE — Progress Notes (Signed)
[address]       [place] [address]             [phone]                    [name] [facility] Medical Record [id number] Date of Birth: 11/22/1940  Referring: Nelva Bush, MD Primary Care: Lottie Dawson, MD  Chief Complaint:    Chief Complaint  Patient presents with  . Coronary Artery Disease    Further discuss possible surgery and treatment    History of Present Illness:    Sharon Wilson 75 y.o. female is seen in the office  today for consideration of coronary artery bypass grafting. 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 She noted several day progression of weakness and difficulty with her speech and vision in her left eye and weakness in her left hand. She was ultimately admitted approximately 3 days to , and has been having home physical therapy to assist in her stroke recovery. During her evaluation and echocardiogram was done in September with suggested an area of inferior hypokinesis. Also leading to follow-up with cardiology after discharge with a CT of the chest and then a cardiac catheterization on 11/17. Stress test was done which showed no EKG changes and was rated as "low probability". She did have trouble after the heart catheterization with bleeding into the right forearm, then lacerated her left finger requiring sutures on the wheelchair she was being discharged.  The patient has slowly increased her physical activity as she recovers from her stroke, she denies any shortness of breath, she denies angina, she denies PND. Since last seen 3 weeks ago she is continued to increase her activity, she notes she's been cleaning the house without difficulty.   She has been on a statin Pravachol, she is not on Plavix but is on aspirin 81 mg.   Since I last saw her her films and history were presented at the monthly cardiac cath conference and there was agreement to continue close medical therapy  rather than proceeding with coronary artery bypass grafting immediately.   Current Activity/ Functional Status:  Patient is independent with mobility/ambulation, transfers, ADL's, IADL's.   Zubrod Score: At the time of surgery this patient's most appropriate activity status/level should be described as: []     0    Normal activity, no symptoms [x]     1    Restricted in physical strenuous activity but ambulatory, able to do out light work []     2    Ambulatory and capable of self care, unable to do work activities, up and about               >50 % of waking hours                              []     3    Only limited self care, in bed greater than 50% of waking hours []     4    Completely disabled, no self care, confined to bed or chair []     5    Moribund   Past Medical History:  Diagnosis Date  . Abnormal echocardiogram    a.07/2016 Echo: EF 50-55%, basal and mid inferolateral AK.  . Agatston coronary artery calcium score greater than 400    a. 07/2016 Abnl Echo with basal and mid inferolateral AK,  EF 50-55%; b. 09/01/2016 Ex MV: EF 46%, hypertensive response, no ST changes, apical thinning with very mild apical ischemia;  c. 09/21/2016 Cardiac CTA: Ca2+ score = 717, 90th percentile. Extensive CAD w/ mod stenosis in distal LM/ostial LAD, 50% RPLV-->rec Cath.  . Anemia    hysterectomy  . CAD (coronary artery disease)    a. 09/01/2016 Ex MV: EF 46%, hypertensive response, no ST changes, apical thinning with very mild apical ischemia;  c. 09/21/2016 Cardiac CTA: Ca2+ score = 717, 90th percentile. Extensive CAD w/ mod stenosis in distal LM/ostial LAD, 50% RPLV-->rec Cath  . Carotid arterial disease w/ left carotid bruit ([place])    a. 07/2016 Carotid U/S: RICA 123456, LICA 123456.  . Diverticulosis   . Hyperlipidemia   . OA (osteoarthritis)   . Stroke (cerebrum) ([place])    a. 07/2016 L facial droop-->Head CT: small area of low attenuation in the right frontoparietal region c/w acute or subacute  lacunar infarct; 07/2016 MRI/A brain: acute infarct of right centrum semiovale and possibly posterior limb of right internal capsule. Mild to moderate multifocal narrowing of the MCA arteries w/o occlusion.  . Type II diabetes mellitus ([place])    a. 07/2016 A1c 7.3.  . White coat hypertension     Past Surgical History:  Procedure Laterality Date  . ABDOMINAL HYSTERECTOMY     fibroid bleeding  . CARDIAC CATHETERIZATION N/A 09/23/2016   Procedure: Left Heart Cath and Coronary Angiography;  Surgeon: Nelva Bush, MD;  Location: [facility];  Service: Cardiovascular;  Laterality: N/A;  . CARDIAC CATHETERIZATION N/A 09/23/2016   Procedure: Intravascular Pressure Wire/FFR Study;  Surgeon: Nelva Bush, MD;  Location: [facility];  Service: Cardiovascular;  Laterality: N/A;  . carotid dopplers  11/11   per [facility] clinic  . CATARACT EXTRACTION  2011    Family History  Problem Relation Age of Onset  . Cancer Mother     breast  . CAD Neg Hx     Social History   Social History  . Marital status: Married    Spouse name: N/A  . Number of children: N/A  . Years of education: N/A   Occupational History  . Not on file.   Social History Main Topics  . Smoking status: Never Smoker  . Smokeless tobacco: Never Used  . Alcohol use No  . Drug use: No  . Sexual activity: Not on file   Other Topics Concern  . Not on file   Social History Narrative   Lives in [place] with husband.   Retired.   hh of 2   No pets    History  Smoking Status  . Never Smoker  Smokeless Tobacco  . Never Used    History  Alcohol Use No     Allergies  Allergen Reactions  . Ace Inhibitors Hives, Swelling and Cough  . Lovastatin Other (See Comments)    Leg cramps  Can take pravastatin  . Norvasc [Amlodipine Besylate]     Light headed and vertigo like  Also may have had hives.     Current Outpatient Prescriptions  Medication Sig Dispense Refill  . acetaminophen (TYLENOL) 325 MG tablet  Take 2 tablets (650 mg total) by mouth every 4 (four) hours as needed for mild pain or moderate pain.    Marland Kitchen aspirin EC 81 MG tablet Take 1 tablet (81 mg total) by mouth daily.    . Calcium Carbonate-Vitamin D (OSCAL 500/200 D-3 PO) Take 1 tablet by mouth 2 (  two) times daily.     . carvedilol (COREG) 12.5 MG tablet Take 1.5 tablets (18.75 mg total) by mouth 2 (two) times daily with a meal. 270 tablet 0  . isosorbide mononitrate (IMDUR) 30 MG 24 hr tablet Take 0.5 tablets (15 mg total) by mouth daily. 30 tablet 5  . metFORMIN (GLUCOPHAGE) 500 MG tablet TAKE ONE TABLET BY MOUTH IN THE MORNING AND TWO TABS IN THE EVENING (Patient taking differently: Take 500 mg by mouth 3 (three) times daily. TAKE ONE TABLET BY MOUTH IN THE MORNING AND TWO TABS IN THE EVENING) 360 tablet 0  . nitroGLYCERIN (NITROSTAT) 0.4 MG SL tablet Place 1 tablet (0.4 mg total) under the tongue every 5 (five) minutes x 3 doses as needed for chest pain. 25 tablet 12  . pravastatin (PRAVACHOL) 40 MG tablet Take 1 tablet (40 mg total) by mouth daily at 6 PM. 90 tablet 1   No current facility-administered medications for this visit.       Review of Systems:     Cardiac Review of Systems: Y or N  Chest Pain [  N  ]  Resting SOB [ N  ] Exertional SOB  [ N ]  Orthopnea [ N ]   Pedal Edema [ N  ]    Palpitations [ N ] Syncope  Aqua.Slicker  ]   Presyncope [ N  ]  General Review of Systems: [Y] = yes [  ]=no Constitional: recent weight change [ N ];  Wt loss over the last 3 months [   ] anorexia [  ]; fatigue [ Y ]; nausea Aqua.Slicker  ]; night sweats [  ]; fever [  ]; or chills [  ];          Dental: poor dentition[  ]; Last Dentist visit:   Eye : blurred vision [ N ]; diplopia [   ]; vision changes [  ];  Amaurosis fugax[  ]; Resp: cough [  ];  wheezing[N  ];  hemoptysis[N  ]; shortness of breath[N  ]; paroxysmal nocturnal dyspnea[ N ]; dyspnea on exertion[  ]; or orthopnea[  ];  GI:  gallstones[  ], vomiting[ N ];  dysphagia[  ]; melena[  ];   hematochezia [  ]; heartburn[  ];   Hx of  Colonoscopy[  ]; GU: kidney stones [  ]; hematuria[ N ];   dysuria [  ];  nocturia[  ];  history of     obstruction [  ]; urinary frequency [  ]             Skin: rash, swelling[  ];, hair loss[  ];  peripheral edema[  ];  or itching[  ]; Musculosketetal: myalgias[  N];  joint swelling[ N ];  joint erythema[ N ];  joint pain[  ];  back pain[  ];  Heme/Lymph: bruising[  ];  bleeding[  ];  anemia[  ];  Neuro: TIA[  ];  headaches[  ];  stroke[Y  ];  vertigo[ N ];  seizures[N];   paresthesias[  N];  difficulty walking[ N ];  Psych:depression[  ]; anxiety[  ];  Endocrine: diabetes[ Y ];  thyroid dysfunction[N  ];  Immunizations: Flu up to date Jazmín.Cullens  ]; Pneumococcal up to date [ Y ];  Other:  Physical Exam: BP (!) 157/75   Pulse 72   Resp 20   Ht 5\' 2"  (1.575 m)   Wt 120 lb (54.4 kg)   SpO2 98%  Comment: RA  BMI 21.95 kg/m   PHYSICAL EXAMINATION: General appearance: alert, cooperative, appears older than stated age and no distress Head: Normocephalic, without obvious abnormality, atraumatic Neck: no adenopathy, no right carotid bruit carotid bruit, patient does have a left carotid bruit, no JVD, supple, symmetrical, trachea midline and thyroid not enlarged, symmetric, no tenderness/mass/nodules Lymph nodes: Cervical, supraclavicular, and axillary nodes normal. Resp: clear to auscultation bilaterally Back: symmetric, no curvature. ROM normal. No CVA tenderness. Cardio: regular rate and rhythm, S1, S2 normal, no murmur, click, rub or gallop GI: soft, non-tender; bowel sounds normal; no masses,  no organomegaly Extremities: extremities normal, atraumatic, no cyanosis or edema and Bruising right forearm, no false aneurysm noted at the radial stick site, Neurologic: Alert and oriented X 3, normal strength and tone. Normal symmetric reflexes. Normal coordination and gait Right arm is still bruised but improved    Diagnostic Studies & Laboratory data:      Recent Radiology Findings:   No results found.   I have independently reviewed the above radiologic studies.  Recent Lab Findings: Lab Results  Component Value Date   WBC 4.9 10/06/2016   HGB 11.7 (L) 10/06/2016   HCT 34.6 (L) 10/06/2016   PLT 224.0 10/06/2016   GLUCOSE 115 (H) 10/06/2016   CHOL 148 10/06/2016   TRIG 135.0 10/06/2016   HDL 34.50 (L) 10/06/2016   LDLDIRECT 136.0 05/05/2016   LDLCALC 87 10/06/2016   ALT 9 10/06/2016   AST 14 10/06/2016   NA 140 10/06/2016   K 4.3 10/06/2016   CL 103 10/06/2016   CREATININE 0.92 10/06/2016   BUN 13 10/06/2016   CO2 32 10/06/2016   TSH 4.15 10/06/2016   INR 1.0 09/22/2016   HGBA1C 6.3 10/06/2016   Stress Test: Study Highlights     Nuclear stress EF: 46%.  Blood pressure demonstrated a hypertensive response to exercise.  There was no ST segment deviation noted during stress.  This is a low risk study.  The left ventricular ejection fraction is mildly decreased (45-54%).   Low risk stress nuclear study with apical thinning and very mild apical ischemia; EF 46 but visually appears normal.   Stress Findings   ECG Baseline ECG exhibits normal sinus rhythm..    Stress Findings The patient exercised following the Bruce protocol.  The patient reported no symptoms during the stress test. The patient experienced no angina during the stress test.   The test was stopped because the patient complained of fatigue.   Heart rate demonstrated a normal response to exercise. Blood pressure demonstrated a hypertensive response to exercise. Overall, the patient's exercise capacity was normal.   Recovery time: 5 minutes. The patient's response to exercise was adequate for diagnosis. Changed to manual. Increased grade and decreased speed. Patient had difficulty keeping up with speed of treadmill.    Response to Stress There was no ST segment deviation noted during stress.  Arrhythmias during stress: rare PVCs.  Arrhythmias  during recovery: none.  Arrhythmias were not significant.  ECG was interpretable and there was no significant change from baseline.    Marland Kitchen  CATH: Procedures   Intravascular Pressure Wire/FFR Study  Left Heart Cath and Coronary Angiography  Conclusion   Conclusions: 1. Significant three-vessel coronary artery disease, as detailed below. Given hemodynamically significant disease involving the ostial/proximal LAD, this represents high risk disease despite lack of patient's symptoms. 2. Normal left ventricular filling pressure.  Recommendations: 1. Outpatient cardiac surgery evaluation for CABG. Though the patient is largely asymptomatic  and had a low risk stress test, her three-vessel disease including hemodynamically significant ostial/proximal LAD stenosis places her at high risk for cardiovascular events. 2. Start isosorbide mononitrate 15 mg daily, to help with myocardial oxygen demand and blood pressure. 3. Start aspirin 81 mg daily and discontinue clopidogrel in anticipation for CABG.  Nelva Bush, MD [facility] Pager: [phone]   Coronary Findings   Dominance: Right  Left Main  Vessel is moderate in size. Short LMCA.  Left Anterior Descending  The vessel is moderately tortuous.  Ost LAD lesion, 60% stenosed. The lesion is calcified. Pressure wire/FFR was performed on the lesion. FFR: 0.72.  Dist LAD lesion, 70% stenosed.  First Diagonal Branch  Vessel is moderate in size.  Ost 1st Diag lesion, 70% stenosed.  Second Diagonal Branch  Vessel is moderate in size.  Ost 2nd Diag to 2nd Diag lesion, 50% stenosed.  Left Circumflex  Vessel is moderate in size.  Mid Cx lesion, 95% stenosed.  First Obtuse Marginal Branch  Vessel is small in size.  1st Mrg lesion, 60% stenosed.  Second Obtuse Marginal Branch  Vessel is small in size.  Ost 2nd Mrg lesion, 70% stenosed.  Third Obtuse Marginal Branch  Vessel is small in size.  Right Coronary Artery  Vessel is  large.  Ost RCA lesion, 30% stenosed. The lesion is calcified.  Right Posterior Descending Artery  Vessel is small in size.  Ost RPDA lesion, 50% stenosed.  Right Posterior Atrioventricular Branch  Post Atrio lesion, 30% stenosed.  First Right Posterolateral  Vessel is small in size.  Second Right Posterolateral  Vessel is small in size.  Third Right Posterolateral  Vessel is large in size.  3rd RPLB lesion, 70% stenosed.  Left Heart   Left Ventricle LV end diastolic pressure is normal.    Coronary Diagrams   Diagnostic Diagram      Hemo Data   Flowsheet Row Most Recent Value  AO Systolic Pressure 99991111 mmHg  AO Diastolic Pressure 56 mmHg  AO Mean 95 mmHg  LV Systolic Pressure 123XX123 mmHg  LV Diastolic Pressure 4 mmHg  LV EDP 10 mmHg  Arterial Occlusion Pressure Extended Systolic Pressure 123XX123 mmHg  Arterial Occlusion Pressure Extended Diastolic Pressure 61 mmHg  Arterial Occlusion Pressure Extended Mean Pressure 101 mmHg  Left Ventricular Apex Extended Systolic Pressure 123456 mmHg  Left Ventricular Apex Extended Diastolic Pressure 4 mmHg  Left Ventricular Apex Extended EDP Pressure 12 mmHg      ECHO 07/2016 Transthoracic Echocardiography  Patient:    Sanyiah, Florian MR #:       [id number] Study Date: 07/22/2016 Gender:     F Age:        75 Height:     160 cm Weight:     58.1 kg BSA:        1.61 m^2 Pt. Status: Room:       5M13C   SONOGRAPHER  Diamond Nickel  ATTENDING    Elgergawy, Silver Huguenin  PERFORMING   [facility], Inpatient  ADMITTING    Opyd, Timothy S  ORDERING     Opyd, Timothy S  REFERRING    Opyd, Timothy S  cc:  ------------------------------------------------------------------- LV EF: 50% -   55%  ------------------------------------------------------------------- Indications:      CVA 48.  ------------------------------------------------------------------- History:   PMH:  White coat hypertension.  Risk factors:  Diabetes mellitus.  Dyslipidemia.  ------------------------------------------------------------------- Study Conclusions  - Left ventricle: The cavity size was normal. Systolic function was  normal. The estimated ejection fraction was in the range of 50%   to 55%. There is akinesis of the basal-midinferolateral   myocardium. Doppler parameters are consistent with abnormal left   ventricular relaxation (grade 1 diastolic dysfunction). Doppler   parameters are consistent with high ventricular filling pressure. - Left atrium: The atrium was mildly dilated. Volume/bsa, ES   (1-plane Simpson&'s, A4C): 38.3 ml/m^2.  Impressions:  - No cardiac source of emboli was indentified.  ------------------------------------------------------------------- Study data:   Study status:  Routine.  Procedure:  Transthoracic echocardiography. Image quality was adequate.  Study completion: There were no complications.          Transthoracic echocardiography.  M-mode, complete 2D, spectral Doppler, and color Doppler.  Birthdate:  Patient birthdate: 09-Sep-1941.  Age:  Patient is 75 yr old.  Sex:  Gender: female.    BMI: 22.7 kg/m^2.  Blood pressure:     183/71  Patient status:  Inpatient.  Study date: Study date: 07/22/2016. Study time: 11:22 AM.  Location:  Echo laboratory.  -------------------------------------------------------------------  ------------------------------------------------------------------- Left ventricle:  The cavity size was normal. Systolic function was normal. The estimated ejection fraction was in the range of 50% to 55%.  Regional wall motion abnormalities:   There is akinesis of the basal-midinferolateral myocardium. Doppler parameters are consistent with abnormal left ventricular relaxation (grade 1 diastolic dysfunction). Doppler parameters are consistent with high ventricular filling pressure.  ------------------------------------------------------------------- Aortic valve:    Trileaflet; normal thickness leaflets. Mobility was not restricted.  Doppler:  Transvalvular velocity was within the normal range. There was no stenosis. There was no regurgitation.   ------------------------------------------------------------------- Aorta:  Aortic root: The aortic root was normal in size.  ------------------------------------------------------------------- Mitral valve:   Mildly thickened leaflets . Mobility was not restricted.  Doppler:  Transvalvular velocity was within the normal range. There was no evidence for stenosis. There was no regurgitation.    Peak gradient (D): 3 mm Hg.  ------------------------------------------------------------------- Left atrium:  The atrium was mildly dilated.  ------------------------------------------------------------------- Right ventricle:  The cavity size was normal. Wall thickness was normal. Systolic function was normal.  ------------------------------------------------------------------- Pulmonic valve:   Poorly visualized.  Structurally normal valve. Cusp separation was normal.  Doppler:  Transvalvular velocity was within the normal range. There was no evidence for stenosis. There was no regurgitation.  ------------------------------------------------------------------- Tricuspid valve:   Structurally normal valve.    Doppler: Transvalvular velocity was within the normal range. There was no regurgitation.  ------------------------------------------------------------------- Pulmonary artery:   The main pulmonary artery was normal-sized. Systolic pressure was within the normal range.  ------------------------------------------------------------------- Right atrium:  The atrium was normal in size.  ------------------------------------------------------------------- Pericardium:  There was no pericardial effusion.  ------------------------------------------------------------------- Systemic veins: Inferior  vena cava: The vessel was normal in size.  ------------------------------------------------------------------- Measurements   Left ventricle                           Value        Reference  LV ID, ED, PLAX chordal        (L)       39.7  mm     43 - 52  LV ID, ES, PLAX chordal                  29.1  mm     23 - 38  LV fx shortening, PLAX chordal (L)       27    %      >=  29  LV PW thickness, ED                      10.8  mm     ---------  IVS/LV PW ratio, ED            (H)       1.41         <=1.3  LV e&', lateral                           6.42  cm/s   ---------  LV E/e&', lateral                         13.6         ---------  LV e&', medial                            4.35  cm/s   ---------  LV E/e&', medial                          20.07        ---------  LV e&', average                           5.39  cm/s   ---------  LV E/e&', average                         16.21        ---------    Ventricular septum                       Value        Reference  IVS thickness, ED                        15.2  mm     ---------    LVOT                                     Value        Reference  LVOT ID, S                               20    mm     ---------  LVOT area                                3.14  cm^2   ---------    Aorta                                    Value        Reference  Aortic root ID, ED                       25    mm     ---------    Left atrium  Value        Reference  LA ID, A-P, ES                           25    mm     ---------  LA ID/bsa, A-P                           1.55  cm/m^2 <=2.2  LA volume, S                             58.9  ml     ---------  LA volume/bsa, S                         36.5  ml/m^2 ---------  LA volume, ES, 1-p A4C                   61.7  ml     ---------  LA volume/bsa, ES, 1-p A4C               38.3  ml/m^2 ---------  LA volume, ES, 1-p A2C                   52.3  ml     ---------  LA volume/bsa, ES, 1-p A2C                32.4  ml/m^2 ---------    Mitral valve                             Value        Reference  Mitral E-wave peak velocity              87.3  cm/s   ---------  Mitral A-wave peak velocity              114   cm/s   ---------  Mitral deceleration time                 187   ms     150 - 230  Mitral peak gradient, D                  3     mm Hg  ---------  Mitral E/A ratio, peak                   0.8          ---------    Right ventricle                          Value        Reference  RV s&', lateral, S                        9.36  cm/s   ---------  Legend: (L)  and  (H)  mark values outside specified reference range.  ------------------------------------------------------------------- Prepared and Electronically Authenticated by  Candee Furbish, M.D. 2017-09-15T12:12:21   Assessment / Plan:   RECENT STROKE RIGHT HEMISPHERIC unknown etiology, patient recovering currently on aspirin, not currently on Plavix. She was initially discharged home on Plavix but was subsequently stopped she's unclear who stopped. History of diabetes- on metformin approximate 4 years  Abnormal echocardiogram leading to cardiac catheterization- the patient has significant disease in the mid to distal circumflex however the distal vessel is probably not bypass was it arises from the AV groove it is very small, she does have disease in her LAD 60-70%, small first obtuse marginal, and 50-60% in the PDA and PL branches of the right-with the patient's relatively recent stroke, lack of symptoms, negative stress test unless think declined to move quickly to proceed with coronary artery bypass grafting as her risk of stroke would be significantly greater.  Patient's films and history were reviewed by Dr. END and Dr. Burt Knack at the recent cardiac cath conference, there was consensus to continue with medical therapy currently. On her visit to the office today she remains asymptomatic.   She has a appointment in several weeks  with Dr. Radford Pax and will address Plavix. She been told not to drive she's also discussed this with Dr. Radford Pax. I be glad to see her at any time as needed.   Grace Isaac MD      [place] ,[place] [address] Office [phone]   Beeper [phone]  10/27/2016 9:17 AM

## 2016-10-27 NOTE — Telephone Encounter (Signed)
LDL was not at goal in November - increase Pravastatin to 80mg  daily and repeat FLP and ALT in 6 weeks

## 2016-11-02 NOTE — Telephone Encounter (Signed)
Left message to call back regarding medications.

## 2016-11-02 NOTE — Telephone Encounter (Addendum)
Instructed patient to INCREASE PRAVASTATIN to 80 mg daily. FLP and ALT scheduled 12/14/16. Instructed patient to RESTART PLAVIX 75 mg daily.  She was not on ASA when she was taking Plavix before, and wants to know from Dr. Radford Pax if she should stop ASA now.  When asked about driving restrictions, she states Neurology did not stop her driving. She states she was advised at the hospital not to drive for a couple weeks until she feels better. She states Dr. Servando Snare said "it should be fine, but to check with primary doctor first."  To Dr. Radford Pax for ASA and driving instructions.

## 2016-11-02 NOTE — Telephone Encounter (Signed)
-----   Message from Sueanne Margarita, MD sent at 10/27/2016  9:17 PM EST ----- Please instruct patient to resume her plavix that Neuro put her on.  Also, please find out if driving restriction was by Neuro for her recent [place] ----- Message ----- From: Grace Isaac, MD Sent: 10/27/2016   9:45 AM To: Sueanne Margarita, MD  Please see this recent information about our joint patient.  Traci, This patient remains asymptomatic and increasing activity from her stroke, she is on statin , not on Plavix and wants to know when she can drive.  Lilia Argue  Gerhardt MD [facility] [address]  [place] [address]        [phone] office        [phone] beeper

## 2016-11-03 MED ORDER — CLOPIDOGREL BISULFATE 75 MG PO TABS: 75.0000 mg | ORAL_TABLET | Freq: Every day | ORAL | 3 refills | Status: DC

## 2016-11-03 MED ORDER — PRAVASTATIN SODIUM 40 MG PO TABS: 80.0000 mg | ORAL_TABLET | Freq: Every day | ORAL | 3 refills | Status: DC

## 2016-11-03 NOTE — Telephone Encounter (Signed)
Confirmed with patient she INCREASED PRAVASTATIN to 80 mg daily starting yesterday. Verified lab appointment 2/7. Patient understands to START PLAVIX 75 mg daily in addition to ASA 81 mg daily. Informed her that per Dr. Radford Pax, it is OK to resume driving. She was grateful for call.

## 2016-11-03 NOTE — Addendum Note (Signed)
Addended by: Harland German A on: 11/03/2016 02:03 PM   Modules accepted: Orders

## 2016-11-03 NOTE — Telephone Encounter (Addendum)
Her pravastatin was supposed to be increased to 80mg  daily and repeat FLP and ALT in 6 weeks.  Ok to resume driving. She should also be on ASA 81mg  daily in addition to Plavix.

## 2016-11-11 ENCOUNTER — Encounter: Payer: Self-pay | Admitting: Cardiology

## 2016-11-11 ENCOUNTER — Ambulatory Visit (INDEPENDENT_AMBULATORY_CARE_PROVIDER_SITE_OTHER): Payer: Medicare Other | Admitting: Cardiology

## 2016-11-11 VITALS — BP 126/64 | HR 72 | Ht 62.0 in | Wt 123.0 lb

## 2016-11-11 DIAGNOSIS — I2510 Atherosclerotic heart disease of native coronary artery without angina pectoris: Secondary | ICD-10-CM

## 2016-11-11 NOTE — Progress Notes (Signed)
11/11/2016 Sharon Wilson   [birth year]  [id number]  Primary Physician Lottie Dawson, MD Primary Cardiologist: Dr. Radford Pax    Reason for Visit/CC: CAD   HPI:  [name] presents to clinic today for post hospital f/u. She is a  76 y.o. female with recent R brain CVA in 9/17, DM, HL, carotid artery disease and white coat HTN. Echo after her CVA demonstrated normal LVF butinferolateral AK prompting an ischemic workup. Nuclear stress test demonstrated apical thinning and very mild apical ischemia. Coronary CTA revealed a coronary calcium score of 717, placing her in the 90th percentile. She also had suggestion of distal left main/ostial LAD plaquing of less than 50% and an area in the RP LV that was approximately 50% as well. She was therefore set up for cardiac catheterization.   She underwent LHC 09/23/16 by Dr. Harrell Gave End that demonstratedsignificant 3 V CAD. Dr. Saunders Revel recommendedOP evaluation for CABG as the patient is asymptomatic. Plavix was DC'd and ASA started giving pending CABG. Isosorbide 15 mg QD also added for myocardial oxygen demand and BP. She also developed R radial hematoma post cath but this is improving. She has a 2+ right radial pulse. Her right forearm is stable but no significant pain.   She was seen by Dr. Servando Snare, in his outpatient clinic, on 10/03/16. He reviewed her cath films. Given the patient's relatively recent stroke, lack of symptoms and negative stress test, he has opted to hold off on surgery, for now, as her risk of recurrent stroke would be significantly greater. She was seen again in f/u by Dr. Servando Snare on 10/27/16 and was still doing well w/u symptoms. The overall consensus is to continue to treat medically, for now, given her lack of symptoms.  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  If symptoms or concerning stress test features  develop, she would likely benefit most from CABG over PCI.  Today in clinic she is CP free. She denies any exertional symptoms. No dyspnea. She reports full medication compliance. She is on ASA, Plavix, Coreg, Imdur and Pravastatin, which was recently increased to 80 mg nightly. Recent LDL 09/2016 was not at goal, at 87 mg/dL. She is scheduled to get a repeat FLP in 4 more weeks.   Current Meds  Medication Sig  . acetaminophen (TYLENOL) 325 MG tablet Take 2 tablets (650 mg total) by mouth every 4 (four) hours as needed for mild pain or moderate pain.  Marland Kitchen aspirin EC 81 MG tablet Take 1 tablet (81 mg total) by mouth daily.  . Calcium Carbonate-Vitamin D (OSCAL 500/200 D-3 PO) Take 1 tablet by mouth 2 (two) times daily.   . carvedilol (COREG) 12.5 MG tablet Take 1.5 tablets (18.75 mg total) by mouth 2 (two) times daily with a meal.  . clopidogrel (PLAVIX) 75 MG tablet Take 1 tablet (75 mg total) by mouth daily.  . isosorbide mononitrate (IMDUR) 30 MG 24 hr tablet Take 0.5 tablets (15 mg total) by mouth daily.  . metFORMIN (GLUCOPHAGE) 500 MG tablet TAKE ONE TABLET BY MOUTH IN THE MORNING AND TWO TABS IN THE EVENING (Patient taking differently: Take 500 mg by mouth 3 (three) times daily. TAKE ONE TABLET BY MOUTH IN THE MORNING AND TWO TABS IN THE EVENING)  . nitroGLYCERIN (NITROSTAT) 0.4 MG SL tablet Place 1 tablet (0.4 mg total) under the tongue every 5 (five) minutes x 3 doses  as needed for chest pain.  . pravastatin (PRAVACHOL) 40 MG tablet Take 2 tablets (80 mg total) by mouth daily.   Allergies  Allergen Reactions  . Ace Inhibitors Hives, Swelling and Cough  . Lovastatin Other (See Comments)    Leg cramps  Can take pravastatin  . Norvasc [Amlodipine Besylate]     Light headed and vertigo like  Also may have had hives.    Past Medical History:  Diagnosis Date  . Abnormal echocardiogram    a.07/2016 Echo: EF 50-55%, basal and mid inferolateral AK.  . [place] coronary artery calcium score  greater than 400    a. 07/2016 Abnl Echo with basal and mid inferolateral AK, EF 50-55%; b. 09/01/2016 Ex MV: EF 46%, hypertensive response, no ST changes, apical thinning with very mild apical ischemia;  c. 09/21/2016 Cardiac CTA: Ca2+ score = 717, 90th percentile. Extensive CAD w/ mod stenosis in distal LM/ostial LAD, 50% RPLV-->rec Cath.  . Anemia    hysterectomy  . CAD (coronary artery disease)    a. 09/01/2016 Ex MV: EF 46%, hypertensive response, no ST changes, apical thinning with very mild apical ischemia;  c. 09/21/2016 Cardiac CTA: Ca2+ score = 717, 90th percentile. Extensive CAD w/ mod stenosis in distal LM/ostial LAD, 50% RPLV-->rec Cath  . Carotid arterial disease w/ left carotid bruit ([place])    a. 07/2016 Carotid U/S: RICA 123456, LICA 123456.  . Diverticulosis   . Hyperlipidemia   . OA (osteoarthritis)   . Stroke (cerebrum) ([place])    a. 07/2016 L facial droop-->Head CT: small area of low attenuation in the right frontoparietal region c/w acute or subacute lacunar infarct; 07/2016 MRI/A brain: acute infarct of right centrum semiovale and possibly posterior limb of right internal capsule. Mild to moderate multifocal narrowing of the MCA arteries w/o occlusion.  . Type II diabetes mellitus ([place])    a. 07/2016 A1c 7.3.  . White coat hypertension    Family History  Problem Relation Age of Onset  . Cancer Mother     breast  . CAD Neg Hx    Past Surgical History:  Procedure Laterality Date  . ABDOMINAL HYSTERECTOMY     fibroid bleeding  . CARDIAC CATHETERIZATION N/A 09/23/2016   Procedure: Left Heart Cath and Coronary Angiography;  Surgeon: Nelva Bush, MD;  Location: [facility];  Service: Cardiovascular;  Laterality: N/A;  . CARDIAC CATHETERIZATION N/A 09/23/2016   Procedure: Intravascular Pressure Wire/FFR Study;  Surgeon: Nelva Bush, MD;  Location: [facility];  Service: Cardiovascular;  Laterality: N/A;  . carotid dopplers  11/11   per [facility]  .  CATARACT EXTRACTION  2011   Social History   Social History  . Marital status: Married    Spouse name: N/A  . Number of children: N/A  . Years of education: N/A   Occupational History  . Not on file.   Social History Main Topics  . Smoking status: Never Smoker  . Smokeless tobacco: Never Used  . Alcohol use No  . Drug use: No  . Sexual activity: Not on file   Other Topics Concern  . Not on file   Social History Narrative   Lives in [place] with husband.   Retired.   hh of 2   No pets     Review of Systems: General: negative for chills, fever, night sweats or weight changes.  Cardiovascular: negative for chest pain, dyspnea on exertion, edema, orthopnea, palpitations, paroxysmal nocturnal dyspnea or shortness of breath  Dermatological: negative for rash Respiratory: negative for cough or wheezing Urologic: negative for hematuria Abdominal: negative for nausea, vomiting, diarrhea, bright red blood per rectum, melena, or hematemesis Neurologic: negative for visual changes, syncope, or dizziness All other systems reviewed and are otherwise negative except as noted above.   Physical Exam:  Blood pressure 126/64, pulse 72, height 5\' 2"  (1.575 m), weight 123 lb (55.8 kg).  General appearance: alert, cooperative and no distress Neck: no carotid bruit and no JVD Lungs: clear to auscultation bilaterally Heart: regular rate and rhythm, S1, S2 normal, no murmur, click, rub or gallop Extremities: extremities normal, atraumatic, no cyanosis or edema Pulses: 2+ and symmetric Skin: Skin color, texture, turgor normal. No rashes or lesions Neurologic: Grossly normal  EKG not performed.   ASSESSMENT AND PLAN:    1. CAD: 3V CAD noted on recent cath, however patient remains asymptomatic. Patient seen in outpatient consult with Dr. Servando Snare. Given her relatively recent stroke, lack of symptoms and negative stress test, he has opted to hold off on surgery, for now, as her risk of  recurrent stroke would be significantly greater. The overall consensus is to continue to treat medically, for now, given her lack of symptoms.  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  If symptoms or concerning stress test features develop, she would likely benefit most from CABG over PCI. She is on ASA, Plavix, Coreg, Imdur and Pravastatin, which was recently increased to 80 mg nightly. Recent LDL 09/2016 was not at goal, at 87 mg/dL. She is scheduled to get a repeat FLP in 4 more weeks. If not at goal <70, will need to consider addition of Zetia, change to a different statin vs PCSK9 inhibitors. Her BP is well controlled. F/u with Dr. Radford Pax in 2-3 months for repeat assessment.     Lyda Jester PA-C 11/11/2016 10:54 AM

## 2016-11-11 NOTE — Patient Instructions (Signed)
Medication Instructions:  None  Labwork: None  Testing/Procedures: None  Follow-Up: Your physician recommends that you schedule a follow-up appointment in: 2-3 months with Dr. Radford Pax or with an APP on a day that Dr. Radford Pax is in the office.    Any Other Special Instructions Will Be Listed Below (If Applicable).     If you need a refill on your cardiac medications before your next appointment, please call your pharmacy.

## 2016-12-04 ENCOUNTER — Encounter: Payer: Self-pay | Admitting: Internal Medicine

## 2016-12-07 ENCOUNTER — Other Ambulatory Visit: Payer: Self-pay | Admitting: Internal Medicine

## 2016-12-07 NOTE — Telephone Encounter (Signed)
Received a fax from the pharmacy requesting #[phone] (4 months).  Sent to the pharmacy. Pt has upcoming follow up on 02/08/17

## 2016-12-14 ENCOUNTER — Other Ambulatory Visit: Payer: Medicare Other | Admitting: *Deleted

## 2016-12-14 DIAGNOSIS — I10 Essential (primary) hypertension: Secondary | ICD-10-CM

## 2016-12-14 DIAGNOSIS — E7800 Pure hypercholesterolemia, unspecified: Secondary | ICD-10-CM

## 2016-12-14 LAB — LIPID PANEL
CHOL/HDL RATIO: 3.1 ratio (ref 0.0–4.4)
Cholesterol, Total: 132 mg/dL (ref 100–199)
HDL: 43 mg/dL (ref >39)
LDL Calculated: 71 mg/dL (ref 0–99)
Triglycerides: 92 mg/dL (ref 0–149)
VLDL CHOLESTEROL CAL: 18 mg/dL (ref 5–40)

## 2016-12-14 LAB — ALT: ALT: 32 IU/L (ref 0–32)

## 2016-12-29 ENCOUNTER — Ambulatory Visit: Payer: Medicare Other | Admitting: Nurse Practitioner

## 2017-01-01 ENCOUNTER — Other Ambulatory Visit: Payer: Self-pay | Admitting: Internal Medicine

## 2017-01-03 NOTE — Telephone Encounter (Signed)
Filled for 9 months.  Pt has follow up on 02/08/17.  Due for yearly 12/18.

## 2017-01-11 ENCOUNTER — Other Ambulatory Visit: Payer: Self-pay | Admitting: Internal Medicine

## 2017-01-11 DIAGNOSIS — Z1231 Encounter for screening mammogram for malignant neoplasm of breast: Secondary | ICD-10-CM

## 2017-01-20 ENCOUNTER — Encounter: Payer: Self-pay | Admitting: Nurse Practitioner

## 2017-01-20 ENCOUNTER — Ambulatory Visit (INDEPENDENT_AMBULATORY_CARE_PROVIDER_SITE_OTHER): Payer: Medicare Other | Admitting: Nurse Practitioner

## 2017-01-20 VITALS — BP 130/82 | HR 70 | Ht 62.0 in | Wt 128.8 lb

## 2017-01-20 DIAGNOSIS — E782 Mixed hyperlipidemia: Secondary | ICD-10-CM | POA: Diagnosis not present

## 2017-01-20 DIAGNOSIS — I10 Essential (primary) hypertension: Secondary | ICD-10-CM

## 2017-01-20 DIAGNOSIS — I639 Cerebral infarction, unspecified: Secondary | ICD-10-CM | POA: Diagnosis not present

## 2017-01-20 NOTE — Patient Instructions (Signed)
Stressed the importance of continued management of risk factors to prevent further stroke Continue Plavix for secondary stroke prevention Maintain strict control of hypertension with blood pressure goal below 130/90, today's reading 130/82 continue antihypertensive medications Control of diabetes with hemoglobin A1c below 6.5 followed by primary care most recent hemoglobin A1c7.3  continue diabetic medications Cholesterol with LDL cholesterol less than 70, followed by Dr. Radford Pax continue Pravastatin  May need to consider PCSK 9 therapy with multiple failed oral meds Exercise by walking, slowly increase , eat healthy diet with whole grains,  fresh fruits and vegetables Follow-up in 6 months Repeat carotid Doppler at that time

## 2017-01-20 NOTE — Progress Notes (Signed)
I have read the note, and I agree with the clinical assessment and plan.  Sharon Wilson,Sharon Wilson   

## 2017-01-20 NOTE — Progress Notes (Signed)
[facility]  PATIENT: Sharon Wilson DOB: [birth year]   REASON FOR VISIT:  follow-up for stroke HISTORY FROM: Patient    HISTORY OF PRESENT ILLNESS:UPDATE 03/16/2018CM Sharon Wilson, 76 year old female returns for follow-up with a history of stroke MRI of the brain with right BG/CR infarct. Stroke occurred in September 2017. She is currently on Plavix and aspirin for secondary stroke prevention without further stroke or TIA symptoms. She has minimal bruising she is also on pravastatin for her cholesterol, she tells me she has failed several statins in the past and is having some myalgias with this. Dr. Radford Pax follows her lipid panel and she is due for exam week. May need to consider PCSK 9 therapy with multiple failed oral meds. Blood pressure in the office today 130/82. She is back to exercising some and will increase that once the weather gets better.She returns for reevaluation     HISTORY 10/17/17CMPatricia W Robinsonis an 76 y.o.femalewith medical history significant for type 2 diabetes mellitus, hypertension, and hyperlipidemia who presents to the emergency department 2 days after symptoms occurred for evaluation of dysarthria and transient loss of coordination involving the left hand. Patient was going to bed on the morning of 07/18/2016 at 12:30 AM when she noticed that her left hand was clumsy while flossing her teeth. She went to bed and noted that her speech was odd but when asking her husband he did not feel anything was wrong.. The next day she was talking to her kids and they noted that her speech was off. She presented to her PCP who quickly sent her to the emergency department as he felt she was likely having a stroke. At that time the only symptoms she had was dysarthria. Patient endorses that she takes aspirin 81 mg daily and has not missed any doses. Patient states that she does not smoke. MRI of the brain with right BG/CR infarct. MRA with diffuse  narrowing of both middle cerebral arteries without occlusion. Carotid ultrasound with 40-59% left ICA stenosis. 2-D echo EF 50-55%. LDL 147 hemoglobin A1c 7.3. She was placed on Plavix for secondary stroke prevention. She returns to the stroke clinic today for follow-up without further stroke or TIA symptoms. She remains on Plavix with minimal bruising , no  bleeding. Blood pressure elevated today 156/61, patient claims she has whitecoat hypertension. She is currently not exercising she returns for reevaluation    REVIEW OF SYSTEMS: Full 14 system review of systems performed and notable only for those listed, all others are neg:  Constitutional: neg  Cardiovascular: neg Ear/Nose/Throat: neg  Skin: neg Eyes: neg Respiratory: neg Gastroitestinal: neg  Hematology/Lymphatic: neg  Endocrine: neg Musculoskeletal:muscle aches on statin drugs Allergy/Immunology: neg Neurological:neg Psychiatric: neg Sleep : neg   ALLERGIES: Allergies  Allergen Reactions  . Ace Inhibitors Hives, Swelling and Cough  . Lovastatin Other (See Comments)    Leg cramps  Can take pravastatin  . Norvasc [Amlodipine Besylate]     Light headed and vertigo like  Also may have had hives.     HOME MEDICATIONS: Outpatient Medications Prior to Visit  Medication Sig Dispense Refill  . acetaminophen (TYLENOL) 325 MG tablet Take 2 tablets (650 mg total) by mouth every 4 (four) hours as needed for mild pain or moderate pain.    Marland Kitchen aspirin EC 81 MG tablet Take 1 tablet (81 mg total) by mouth daily.    . Calcium Carbonate-Vitamin D (OSCAL 500/200 D-3 PO) Take 1 tablet by mouth 2 (two) times daily.     Marland Kitchen  carvedilol (COREG) 12.5 MG tablet TAKE ONE & ONE-HALF TABLETS BY MOUTH TWICE DAILY WITH A MEAL 270 tablet 2  . clopidogrel (PLAVIX) 75 MG tablet Take 1 tablet (75 mg total) by mouth daily. 90 tablet 3  . isosorbide mononitrate (IMDUR) 30 MG 24 hr tablet Take 0.5 tablets (15 mg total) by mouth daily. 30 tablet 5  . metFORMIN  (GLUCOPHAGE) 500 MG tablet TAKE ONE TABLET BY MOUTH IN THE MORNING AND TWO TABS IN THE EVENING (Patient taking differently: Take 500 mg by mouth 3 (three) times daily. TAKE ONE TABLET BY MOUTH IN THE MORNING AND TWO TABS IN THE EVENING) 360 tablet 0  . nitroGLYCERIN (NITROSTAT) 0.4 MG SL tablet Place 1 tablet (0.4 mg total) under the tongue every 5 (five) minutes x 3 doses as needed for chest pain. 25 tablet 12  . pravastatin (PRAVACHOL) 40 MG tablet Take 2 tablets (80 mg total) by mouth daily. 180 tablet 3  . metFORMIN (GLUCOPHAGE) 500 MG tablet TAKE 1 TABLET BY MOUTH IN THE MORNING AND 2 TABLETS IN THE EVENING 360 tablet 0   No facility-administered medications prior to visit.     PAST MEDICAL HISTORY: Past Medical History:  Diagnosis Date  . Abnormal echocardiogram    a.07/2016 Echo: EF 50-55%, basal and mid inferolateral AK.  . Agatston coronary artery calcium score greater than 400    a. 07/2016 Abnl Echo with basal and mid inferolateral AK, EF 50-55%; b. 09/01/2016 Ex MV: EF 46%, hypertensive response, no ST changes, apical thinning with very mild apical ischemia;  c. 09/21/2016 Cardiac CTA: Ca2+ score = 717, 90th percentile. Extensive CAD w/ mod stenosis in distal LM/ostial LAD, 50% RPLV-->rec Cath.  . Anemia    hysterectomy  . CAD (coronary artery disease)    a. 09/01/2016 Ex MV: EF 46%, hypertensive response, no ST changes, apical thinning with very mild apical ischemia;  c. 09/21/2016 Cardiac CTA: Ca2+ score = 717, 90th percentile. Extensive CAD w/ mod stenosis in distal LM/ostial LAD, 50% RPLV-->rec Cath  . Carotid arterial disease w/ left carotid bruit ([place])    a. 07/2016 Carotid U/S: RICA 0-30%, LICA 09-23%.  . Diverticulosis   . Hyperlipidemia   . OA (osteoarthritis)   . Stroke (cerebrum) ([place])    a. 07/2016 L facial droop-->Head CT: small area of low attenuation in the right frontoparietal region c/w acute or subacute lacunar infarct; 07/2016 MRI/A brain: acute infarct of right  centrum semiovale and possibly posterior limb of right internal capsule. Mild to moderate multifocal narrowing of the MCA arteries w/o occlusion.  . Type II diabetes mellitus ([place])    a. 07/2016 A1c 7.3.  Marland Kitchen White coat hypertension     PAST SURGICAL HISTORY: Past Surgical History:  Procedure Laterality Date  . ABDOMINAL HYSTERECTOMY     fibroid bleeding  . CARDIAC CATHETERIZATION N/A 09/23/2016   Procedure: Left Heart Cath and Coronary Angiography;  Surgeon: Nelva Bush, MD;  Location: [place] [facility];  Service: Cardiovascular;  Laterality: N/A;  . CARDIAC CATHETERIZATION N/A 09/23/2016   Procedure: Intravascular Pressure Wire/FFR Study;  Surgeon: Nelva Bush, MD;  Location: [place] [facility];  Service: Cardiovascular;  Laterality: N/A;  . carotid dopplers  11/11   per HA clinic  . CATARACT EXTRACTION  2011    FAMILY HISTORY: Family History  Problem Relation Age of Onset  . Cancer Mother     breast  . CAD Neg Hx     SOCIAL HISTORY: Social History   Social  History  . Marital status: Married    Spouse name: N/A  . Number of children: N/A  . Years of education: N/A   Occupational History  . Not on file.   Social History Main Topics  . Smoking status: Never Smoker  . Smokeless tobacco: Never Used  . Alcohol use No  . Drug use: No  . Sexual activity: Not on file   Other Topics Concern  . Not on file   Social History Narrative   Lives in [place] with husband.   Retired.   hh of 2   No pets     PHYSICAL EXAM  Vitals:   01/20/17 0945  BP: (!) 130/82   Pulse: 70  Weight: 128 lb 12.8 oz (58.4 kg)  Height: 5\' 2"  (1.575 m)   Body mass index is 23.56 kg/m.  Generalized: Well developed, in no acute distress  Head: normocephalic and atraumatic,. Oropharynx benign  Neck: Supple, no carotid bruits  Cardiac: Regular rate rhythm, no murmur  Musculoskeletal: No deformity   Neurological examination   Mentation: Alert oriented to time, place, history  taking. Attention span and concentration appropriate. Recent and remote memory intact.  Follows all commands speech and language fluent.   Cranial nerve II-XII: Pupils were equal round reactive to light extraocular movements were full, visual field were full on confrontational test. Facial sensation and strength were normal. hearing was intact to finger rubbing bilaterally. Uvula tongue midline. head turning and shoulder shrug were normal and symmetric.Tongue protrusion into cheek strength was normal. Motor: normal bulk and tone, full strength in the BUE, BLE, fine finger movements normal, no pronator drift. No focal weakness Sensory: normal and symmetric to light touch, pinprick, and  Vibration, in the upper and lower extremities Coordination: finger-nose-finger, heel-to-shin bilaterally, no dysmetria, no tremor Reflexes: Brachioradialis 2/2, biceps 2/2, triceps 2/2, patellar 2/2, Achilles 2/2, plantar responses were flexor bilaterally. Gait and Station: Rising up from seated position without assistance, normal stance,  moderate stride, good arm swing, smooth turning, able to perform tiptoe, and heel walking without difficulty. Tandem gait is  mildly unsteady. No assistive device  DIAGNOSTIC DATA (LABS, IMAGING, TESTING) - I reviewed patient records, labs, notes, testing and imaging myself where available.  Lab Results  Component Value Date   WBC 4.9 10/06/2016   HGB 11.7 (L) 10/06/2016   HCT 34.6 (L) 10/06/2016   MCV 90.1 10/06/2016   PLT 224.0 10/06/2016      Component Value Date/Time   NA 140 10/06/2016 0826   K 4.3 10/06/2016 0826   CL 103 10/06/2016 0826   CO2 32 10/06/2016 0826   GLUCOSE 115 (H) 10/06/2016 0826   BUN 13 10/06/2016 0826   CREATININE 0.92 10/06/2016 0826   CREATININE 0.85 09/22/2016 1537   CALCIUM 9.3 10/06/2016 0826   PROT 6.0 10/06/2016 0826   ALBUMIN 4.0 10/06/2016 0826   AST 14 10/06/2016 0826   ALT 32 12/14/2016 0754   ALKPHOS 51 10/06/2016 0826    BILITOT 0.6 10/06/2016 0826   GFRNONAA >60 09/24/2016 0341   GFRAA >60 09/24/2016 0341   Lab Results  Component Value Date   CHOL 132 12/14/2016   HDL 43 12/14/2016   LDLCALC 71 12/14/2016   LDLDIRECT 136.0 05/05/2016   TRIG 92 12/14/2016   CHOLHDL 3.1 12/14/2016   Lab Results  Component Value Date   HGBA1C 6.3 10/06/2016       ASSESSMENT AND PLAN  76 y.o. year old female  has a past medical history  of  Hyperlipidemia;  hypertension. And  follow-up for  stroke MRI of the brain with right BG/CR infarct. MRA with diffuse narrowing of both middle cerebral arteries without occlusion. Carotid ultrasound with 40-59% left ICA stenosis. The patient is a current patient of Dr. Erlinda Hong  who is out of the office today . This note is sent to the work in doctor.      PLAN: Stressed the importance of continued management of risk factors to prevent further stroke Continue Plavix for secondary stroke prevention Maintain strict control of hypertension with blood pressure goal below 130/90, today's reading 130/82 continue antihypertensive medications Control of diabetes with hemoglobin A1c below 6.5 followed by primary care most recent hemoglobin A1c7.3  continue diabetic medications Cholesterol with LDL cholesterol less than 70, followed by Dr. Radford Pax continue Pravastatin  May need to consider PCSK 9 therapy with multiple failed oral meds Exercise by walking, slowly increase , eat healthy diet with whole grains,  fresh fruits and vegetables Follow-up in 6 months Repeat carotid Doppler at that time to follow up for 40-59% left ICA stenosis I spent 37min  in total face to face time with the patient more than 50% of which was spent counseling and coordination of care, reviewing test results reviewing medications and discussing and reviewing the diagnosis of stroke and  risk factor modification. In addition we discussed PCSK 9  Therapy when oral statins have caused side effects. Dennie Bible, [facility],  [facility], APRN  [facility] [address] [place], [place] [address] [phone]

## 2017-01-24 ENCOUNTER — Encounter: Payer: Self-pay | Admitting: Cardiology

## 2017-02-01 ENCOUNTER — Ambulatory Visit
Admission: RE | Admit: 2017-02-01 | Discharge: 2017-02-01 | Disposition: A | Discharge status: Home / Self Care | Payer: Medicare Other | Source: Ambulatory Visit | Attending: Internal Medicine | Admitting: Internal Medicine

## 2017-02-01 DIAGNOSIS — Z1231 Encounter for screening mammogram for malignant neoplasm of breast: Secondary | ICD-10-CM

## 2017-02-02 ENCOUNTER — Ambulatory Visit (INDEPENDENT_AMBULATORY_CARE_PROVIDER_SITE_OTHER): Payer: Medicare Other | Admitting: Cardiology

## 2017-02-02 ENCOUNTER — Encounter: Payer: Self-pay | Admitting: Cardiology

## 2017-02-02 VITALS — BP 138/78 | HR 72 | Ht 62.0 in | Wt 127.0 lb

## 2017-02-02 DIAGNOSIS — IMO0001 Reserved for inherently not codable concepts without codable children: Secondary | ICD-10-CM

## 2017-02-02 DIAGNOSIS — I10 Essential (primary) hypertension: Secondary | ICD-10-CM | POA: Diagnosis not present

## 2017-02-02 DIAGNOSIS — I25118 Atherosclerotic heart disease of native coronary artery with other forms of angina pectoris: Secondary | ICD-10-CM | POA: Diagnosis not present

## 2017-02-02 DIAGNOSIS — I2510 Atherosclerotic heart disease of native coronary artery without angina pectoris: Secondary | ICD-10-CM | POA: Insufficient documentation

## 2017-02-02 DIAGNOSIS — E7800 Pure hypercholesterolemia, unspecified: Secondary | ICD-10-CM

## 2017-02-02 DIAGNOSIS — E1165 Type 2 diabetes mellitus with hyperglycemia: Secondary | ICD-10-CM | POA: Diagnosis not present

## 2017-02-02 DIAGNOSIS — I739 Peripheral vascular disease, unspecified: Secondary | ICD-10-CM

## 2017-02-02 DIAGNOSIS — I779 Disorder of arteries and arterioles, unspecified: Secondary | ICD-10-CM | POA: Diagnosis not present

## 2017-02-02 NOTE — Progress Notes (Signed)
Cardiology Office Note    Date:  02/02/2017   ID:  Sharon Wilson, Sharon Wilson 05-Aug-1941, MRN [id number]  PCP:  Lottie Dawson, MD  Cardiologist:  Fransico Him, MD   Chief Complaint  Patient presents with  . Coronary Artery Disease  . Hypertension  . Hyperlipidemia    History of Present Illness:  Sharon Wilson is a 76 y.o. female with recent R brain CVA in 9/17, DM, HL, carotid artery disease and white coat HTN. Echo after her CVA demonstrated normal LVF butinferolateral AK prompting an ischemic workup. Nuclear stress test demonstrated apical thinning and very mild apical ischemia. Coronary CTA revealed a coronary calcium score of 717, placing her in the 90th percentile. She also had suggestion of distal left main/ostial LAD plaquing of less than 50% and an area in the RP LV that was approximately 50% as well. She underwent cath showing severe 3 vessel ASCAD.  She was seen by Dr. Servando Snare on 10/03/16 and giventhe patient's relatively recent stroke, lack of symptoms and negative stress test, he has opted to hold off on surgery, as her risk of recurrent stroke would be significantly greater. She was seen again in f/u by Dr. Servando Snare on 10/27/16 and  overall consensus was to continue to treat medically, for now, given her lack of symptoms.  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 If symptoms or concerning stress test features develop, she would likely benefit most from CABG over PCI.  She is now here for followup.  She is doing well.  She denies any anginal chest pain, SOB, DOE, PND, orthopnea, LE edema, dizziness, palpitations or syncope.  Her only complaint today is that she is having sever myalgias on pravastatin.  She has tried lipitor and simvastatin in the past and did not tolerate these.  She had to stop walking on her treadmill due to her muscle aches.    Past Medical  History:  Diagnosis Date  . Abnormal echocardiogram    a.07/2016 Echo: EF 50-55%, basal and mid inferolateral AK.  . Agatston coronary artery calcium score greater than 400    a. 07/2016 Abnl Echo with basal and mid inferolateral AK, EF 50-55%; b. 09/01/2016 Ex MV: EF 46%, hypertensive response, no ST changes, apical thinning with very mild apical ischemia;  c. 09/21/2016 Cardiac CTA: Ca2+ score = 717, 90th percentile. Extensive CAD w/ mod stenosis in distal LM/ostial LAD, 50% RPLV-->rec Cath.  . Anemia    hysterectomy  . CAD (coronary artery disease)    a. 09/01/2016 Ex MV: EF 46%, hypertensive response, no ST changes, apical thinning with very mild apical ischemia;  c. 09/21/2016 Cardiac CTA: Ca2+ score = 717, 90th percentile. Extensive CAD w/ mod stenosis in distal LM/ostial LAD, 50% RPLV-->rec Cath  . Carotid arterial disease w/ left carotid bruit ([place])    a. 07/2016 Carotid U/S: RICA 5-05%, LICA 39-76%.  . Diverticulosis   . Hyperlipidemia   . OA (osteoarthritis)   . Stroke (cerebrum) ([place])    a. 07/2016 L facial droop-->Head CT: small area of low attenuation in the right frontoparietal region c/w acute or subacute lacunar infarct; 07/2016 MRI/A brain: acute infarct of right centrum semiovale and possibly posterior limb of right internal capsule. Mild to moderate multifocal narrowing of the MCA arteries w/o occlusion.  . Type II diabetes mellitus ([place])    a. 07/2016 A1c 7.3.  . White coat  hypertension     Past Surgical History:  Procedure Laterality Date  . ABDOMINAL HYSTERECTOMY     fibroid bleeding  . CARDIAC CATHETERIZATION N/A 09/23/2016   Procedure: Left Heart Cath and Coronary Angiography;  Surgeon: Nelva Bush, MD;  Location: [place] [facility];  Service: Cardiovascular;  Laterality: N/A;  . CARDIAC CATHETERIZATION N/A 09/23/2016   Procedure: Intravascular Pressure Wire/FFR Study;  Surgeon: Nelva Bush, MD;  Location: [place] [facility];  Service: Cardiovascular;   Laterality: N/A;  . carotid dopplers  11/11   per HA clinic  . CATARACT EXTRACTION  2011    Current Medications: Current Meds  Medication Sig  . acetaminophen (TYLENOL) 325 MG tablet Take 2 tablets (650 mg total) by mouth every 4 (four) hours as needed for mild pain or moderate pain.  Marland Kitchen aspirin EC 81 MG tablet Take 1 tablet (81 mg total) by mouth daily.  . Calcium Carbonate-Vitamin D (OSCAL 500/200 D-3 PO) Take 1 tablet by mouth 2 (two) times daily.   . carvedilol (COREG) 12.5 MG tablet TAKE ONE & ONE-HALF TABLETS BY MOUTH TWICE DAILY WITH A MEAL  . clopidogrel (PLAVIX) 75 MG tablet Take 1 tablet (75 mg total) by mouth daily.  . isosorbide mononitrate (IMDUR) 30 MG 24 hr tablet Take 0.5 tablets (15 mg total) by mouth daily.  . metFORMIN (GLUCOPHAGE) 500 MG tablet TAKE ONE TABLET BY MOUTH IN THE MORNING AND TWO TABS IN THE EVENING (Patient taking differently: Take 500 mg by mouth 3 (three) times daily. TAKE ONE TABLET BY MOUTH IN THE MORNING AND TWO TABS IN THE EVENING)  . nitroGLYCERIN (NITROSTAT) 0.4 MG SL tablet Place 1 tablet (0.4 mg total) under the tongue every 5 (five) minutes x 3 doses as needed for chest pain.  . pravastatin (PRAVACHOL) 40 MG tablet Take 2 tablets (80 mg total) by mouth daily. (Patient taking differently: Take 40 mg by mouth daily. )    Allergies:   Ace inhibitors; Lovastatin; and Norvasc [amlodipine besylate]   Social History   Social History  . Marital status: Married    Spouse name: N/A  . Number of children: N/A  . Years of education: N/A   Social History Main Topics  . Smoking status: Never Smoker  . Smokeless tobacco: Never Used  . Alcohol use No  . Drug use: No  . Sexual activity: Not on file   Other Topics Concern  . Not on file   Social History Narrative   Lives in [place] with husband.   Retired.   hh of 2   No pets     Family History:  The patient's family history includes Breast cancer in her mother; Cancer in her mother.   ROS:     Please see the history of present illness.    ROS All other systems reviewed and are negative.  PAD Screen 08/22/2016  Previous PAD dx? No  Previous surgical procedure? No  Pain with walking? No  Feet/toe relief with dangling? No  Painful, non-healing ulcers? No  Extremities discolored? No       PHYSICAL EXAM:   VS:  BP 138/78   Pulse 72   Ht 5\' 2"  (1.575 m)   Wt 127 lb (57.6 kg)   SpO2 97%   BMI 23.23 kg/m    GEN: Well nourished, well developed, in no acute distress  HEENT: normal  Neck: no JVD or masses.  Bilateral carotid bruits Cardiac: RRR; no murmurs, rubs, or gallops,no edema.  Intact distal  pulses bilaterally.  Respiratory:  clear to auscultation bilaterally, normal work of breathing GI: soft, nontender, nondistended, + BS MS: no deformity or atrophy  Skin: warm and dry, no rash Neuro:  Alert and Oriented x 3, Strength and sensation are intact Psych: euthymic mood, full affect  Wt Readings from Last 3 Encounters:  02/02/17 127 lb (57.6 kg)  01/20/17 128 lb 12.8 oz (58.4 kg)  11/11/16 123 lb (55.8 kg)      Studies/Labs Reviewed:   EKG:  EKG is not ordered today.   Recent Labs: 10/06/2016: BUN 13; Creatinine, Ser 0.92; Hemoglobin 11.7; Platelets 224.0; Potassium 4.3; Sodium 140; TSH 4.15 12/14/2016: ALT 32   Lipid Panel    Component Value Date/Time   CHOL 132 12/14/2016 0754   TRIG 92 12/14/2016 0754   HDL 43 12/14/2016 0754   CHOLHDL 3.1 12/14/2016 0754   CHOLHDL 4 10/06/2016 0826   VLDL 27.0 10/06/2016 0826   LDLCALC 71 12/14/2016 0754   LDLDIRECT 136.0 05/05/2016 0816    Additional studies/ records that were reviewed today include:  none    ASSESSMENT:    1. Coronary artery disease of native artery of native heart with stable angina pectoris ([place])   2. Essential hypertension   3. Pure hypercholesterolemia   4. Uncontrolled type 2 diabetes mellitus without complication, without long-term current use of insulin ([place])      PLAN:  In  order of problems listed above:  1. ASCAD with stable angina.  She has severe 3 vessel CAD but no CABG done due to relatively recent stroke, lack of symptoms and negative stress test with risk of recurrent stroke significantly greater if we proceeded at this time.   She has been asymptomatic and it was recommended by CVTS surgery that we continue to treat medically and if she develops angina then proceed with CABG. If symptoms or concerning stress test features develop, she would likely benefit most from CABG over PCI I will repeat a nuclear stress test next fall to make sure she has not developed worsening ischemia from prior scan. She will continue on ASA/BB/Plavix/long acting nitrate.  She had to drop her pravastatin dose down due to severe body aches.  I have told her she can go ahead and stop it since she cannot exercise due to severe body aches.  2. HTN - BP controlled on current meds. She will continue on Carvedilol.  3. Hyperlipidemia with LDL goal < 70  Her LDL was 71 last month.  She is intolerant to lipitor and simvastatin and has had severe body aches on pravastatin.  I will refer her to lipid clinic to consider the PCSK 9 inhibitor.    4. Type 2 DM - PCP is following.  Her last HbA1C was 6.3.  5.   Carotid artery stenosis bilaterally -  07/2016 Carotid U/S: RICA 0-30%, LICA 09-23%.  I will repeat this 07/2017    Medication Adjustments/Labs and Tests Ordered: Current medicines are reviewed at length with the patient today.  Concerns regarding medicines are outlined above.  Medication changes, Labs and Tests ordered today are listed in the Patient Instructions below.  There are no Patient Instructions on file for this visit.   Signed, Fransico Him, MD  02/02/2017 10:08 AM    [facility] [address], [place], [place]  [address] Phone: [phone]; Fax: [phone]

## 2017-02-02 NOTE — Patient Instructions (Addendum)
Medication Instructions:  Your physician recommends that you continue on your current medications as directed. Please refer to the Current Medication list given to you today.   Labwork: None  Testing/Procedures: Your physician has requested that you have a carotid duplex in September, 2018. This test is an ultrasound of the carotid arteries in your neck. It looks at blood flow through these arteries that supply the brain with blood. Allow one hour for this exam. There are no restrictions or special instructions.  Dr. Radford Pax recommends you have a NUCLEAR STRESS TEST in October, 2018.  Follow-Up: You have been referred to LIPID CLINIC.  Your physician wants you to follow-up in: 6 months with Dr. Radford Pax. You will receive a reminder letter in the mail two months in advance. If you don't receive a letter, please call our office to schedule the follow-up appointment.   Any Other Special Instructions Will Be Listed Below (If Applicable).     If you need a refill on your cardiac medications before your next appointment, please call your pharmacy.

## 2017-02-04 NOTE — Progress Notes (Signed)
Chief Complaint  Patient presents with  . Follow-up    HPI: Sharon Wilson 76 y.o. come in for Chronic disease management Dm sp cva cad  Since last visit   Just saw Dr  Radford Pax and referred to lipid clinic because of statin intolerance   Co juperr arma ches and legg fatigue with activity and interrupts sleep  No fever falling   Dm   Metformin diarrhea on 3 pills     So alternates and takes at different times    Diet not as good since toree   bp at home 126 at home  .  This am .  No new neuro sx.  Speech not back to nl good days and baad  Left arm sx better  Heart cath set her back     Less muscle  Pain off pravastatin. Speech still off.  Right arm  Issues   From radial cath.   Not as good sleep.   No se of carvediolol  No bleeding No tingling numbness feet    . Coronary artery disease of native artery of native heart with stable angina pectoris ([place])   2. Essential hypertension   3. Pure hypercholesterolemia   4. Uncontrolled type 2 diabetes mellitus without complication, without long-term current use of insulin ([place])      PLAN:  In order of problems listed above:  1. ASCAD with stable angina.  She has severe 3 vessel CAD but no CABG done due to relatively recent stroke, lack of symptoms and negative stress test with risk of recurrent stroke significantly greater if we proceeded at this time.   She has been asymptomatic and it was recommended by CVTS surgery that we continue to treat medically and if she develops angina then proceed with CABG. If symptoms or concerning stress test features develop, she would likely benefit most from CABG over PCI I will repeat a nuclear stress test next fall to make sure she has not developed worsening ischemia from prior scan. She will continue on ASA/BB/Plavix/long acting nitrate.  She had to drop her pravastatin dose down due to severe body aches.  I have told her she can go ahead and stop it since she cannot exercise due to severe  body aches.  2. HTN - BP controlled on current meds. She will continue on Carvedilol.  3. Hyperlipidemia with LDL goal < 70  Her LDL was 71 last month.  She is intolerant to lipitor and simvastatin and has had severe body aches on pravastatin.  I will refer her to lipid clinic to consider the PCSK 9 inhibitor.    4. Type 2 DM - PCP is following.  Her last HbA1C was 6.3.  5.   Carotid artery stenosis bilaterally -  07/2016 Carotid U/S: RICA 7-25%, LICA 36-64%.  I will repeat this 07/2017    Medication Adjustments/Labs and Tests Ordered: Current medicines are reviewed at length with the patient today.  Concerns regarding medicines are outlined above.  Medication changes, Labs and Tests ordered today are listed in the Patient Instructions below.  There are no Patient Instructions on file for this visit.   Signed, Fransico Him, MD  02/02/2017 10:08 AM    [facility] [address], [place], [place]  [address] Phone: [phone]; Fax: [phone]   ROS: See pertinent positives and negatives per HPI.  Past Medical History:  Diagnosis Date  . Abnormal echocardiogram    a.07/2016 Echo: EF 50-55%, basal and  mid inferolateral AK.  . Agatston coronary artery calcium score greater than 400    a. 07/2016 Abnl Echo with basal and mid inferolateral AK, EF 50-55%; b. 09/01/2016 Ex MV: EF 46%, hypertensive response, no ST changes, apical thinning with very mild apical ischemia;  c. 09/21/2016 Cardiac CTA: Ca2+ score = 717, 90th percentile. Extensive CAD w/ mod stenosis in distal LM/ostial LAD, 50% RPLV-->rec Cath.  . Anemia    hysterectomy  . CAD (coronary artery disease)    a. 09/01/2016 Ex MV: EF 46%, hypertensive response, no ST changes, apical thinning with very mild apical ischemia;  c. 09/21/2016 Cardiac CTA: Ca2+ score = 717, 90th percentile. Extensive CAD w/ mod stenosis in distal LM/ostial LAD, 50% RPLV-->rec Cath  . Carotid arterial disease w/ left carotid  bruit ([place])    a. 07/2016 Carotid U/S: RICA 4-13%, LICA 24-40%.  . Diverticulosis   . Hyperlipidemia   . OA (osteoarthritis)   . Stroke (cerebrum) ([place])    a. 07/2016 L facial droop-->Head CT: small area of low attenuation in the right frontoparietal region c/w acute or subacute lacunar infarct; 07/2016 MRI/A brain: acute infarct of right centrum semiovale and possibly posterior limb of right internal capsule. Mild to moderate multifocal narrowing of the MCA arteries w/o occlusion.  . Type II diabetes mellitus ([place])    a. 07/2016 A1c 7.3.  . White coat hypertension     Family History  Problem Relation Age of Onset  . Cancer Mother     breast  . Breast cancer Mother   . CAD Neg Hx     Social History   Social History  . Marital status: Married    Spouse name: N/A  . Number of children: N/A  . Years of education: N/A   Social History Main Topics  . Smoking status: Never Smoker  . Smokeless tobacco: Never Used  . Alcohol use No  . Drug use: No  . Sexual activity: Not Asked   Other Topics Concern  . None   Social History Narrative   Lives in [place] with husband.   Retired.   hh of 2   No pets    Outpatient Medications Prior to Visit  Medication Sig Dispense Refill  . acetaminophen (TYLENOL) 325 MG tablet Take 2 tablets (650 mg total) by mouth every 4 (four) hours as needed for mild pain or moderate pain.    Marland Kitchen aspirin EC 81 MG tablet Take 1 tablet (81 mg total) by mouth daily.    . Calcium Carbonate-Vitamin D (OSCAL 500/200 D-3 PO) Take 1 tablet by mouth 2 (two) times daily.     . carvedilol (COREG) 12.5 MG tablet TAKE ONE & ONE-HALF TABLETS BY MOUTH TWICE DAILY WITH A MEAL 270 tablet 2  . clopidogrel (PLAVIX) 75 MG tablet Take 1 tablet (75 mg total) by mouth daily. 90 tablet 3  . isosorbide mononitrate (IMDUR) 30 MG 24 hr tablet Take 0.5 tablets (15 mg total) by mouth daily. 30 tablet 5  . metFORMIN (GLUCOPHAGE) 500 MG tablet TAKE ONE TABLET BY MOUTH IN THE MORNING AND TWO  TABS IN THE EVENING (Patient taking differently: Take 500 mg by mouth 3 (three) times daily. TAKE ONE TABLET BY MOUTH IN THE MORNING AND TWO TABS IN THE EVENING) 360 tablet 0  . nitroGLYCERIN (NITROSTAT) 0.4 MG SL tablet Place 1 tablet (0.4 mg total) under the tongue every 5 (five) minutes x 3 doses as needed for chest pain. (Patient not taking: Reported on 02/08/2017) 25  tablet 12  . pravastatin (PRAVACHOL) 40 MG tablet Take 2 tablets (80 mg total) by mouth daily. (Patient not taking: Reported on 02/08/2017) 180 tablet 3   No facility-administered medications prior to visit.      EXAM:  BP 140/62 (BP Location: Right Arm)   Pulse 68   Temp 97.9 F (36.6 C) (Oral)   Ht '5\' 2"'  (1.575 m)   Wt 128 lb 3.2 oz (58.2 kg)   BMI 23.45 kg/m   Body mass index is 23.45 kg/m.  GENERAL: vitals reviewed and listed above, alert, oriented, appears well hydrated and in no acute distress HEENT: atraumatic, conjunctiva  clear, no obvious abnormalities on inspection of external nose and earsNECK: no obvious masses on inspection palpation  LUNGS: clear to auscultation bilaterally, no wheezes, rales or rhonchi,  CV: HRRR, no clubbing cyanosis or  peripheral edema nl cap refill  MS: moves all extremities  Strength not testted can raise arms over head  sppech almost normal subtle  dysarthria  Gait appear normal  PSYCH: pleasant and cooperative, no obvious depression or anxiety Lab Results  Component Value Date   WBC 4.9 10/06/2016   HGB 11.7 (L) 10/06/2016   HCT 34.6 (L) 10/06/2016   PLT 224.0 10/06/2016   GLUCOSE 115 (H) 10/06/2016   CHOL 132 12/14/2016   TRIG 92 12/14/2016   HDL 43 12/14/2016   LDLDIRECT 136.0 05/05/2016   LDLCALC 71 12/14/2016   ALT 32 12/14/2016   AST 14 10/06/2016   NA 140 10/06/2016   K 4.3 10/06/2016   CL 103 10/06/2016   CREATININE 0.92 10/06/2016   BUN 13 10/06/2016   CO2 32 10/06/2016   TSH 4.15 10/06/2016   INR 1.0 09/22/2016   HGBA1C 6.3 10/06/2016   MICROALBUR 4.3  (H) 10/06/2016   BP Readings from Last 3 Encounters:  02/08/17 140/62  02/02/17 138/78  01/20/17 130/82   Wt Readings from Last 3 Encounters:  02/08/17 128 lb 3.2 oz (58.2 kg)  02/02/17 127 lb (57.6 kg)  01/20/17 128 lb 12.8 oz (58.4 kg)      ASSESSMENT AND PLAN:  Discussed the following assessment and plan:  Anemia, mild - Plan: Iron, TIBC and Ferritin Panel, CBC with Differential/Platelet, Hemoglobin A1c, Vitamin B12, Sedimentation rate  Medication management - Plan: Iron, TIBC and Ferritin Panel, CBC with Differential/Platelet, Hemoglobin A1c, Vitamin B12  Essential hypertension - Plan: Iron, TIBC and Ferritin Panel, CBC with Differential/Platelet, Hemoglobin A1c, Vitamin B12  Non-insulin dependent type 2 diabetes mellitus (HCC) - Plan: Iron, TIBC and Ferritin Panel, CBC with Differential/Platelet, Hemoglobin A1c, Vitamin B12  Statin intolerance - Plan: Iron, TIBC and Ferritin Panel, CBC with Differential/Platelet, Hemoglobin A1c, Vitamin B12, CK  Myalgia - Plan: Iron, TIBC and Ferritin Panel, CBC with Differential/Platelet, Hemoglobin A1c, Vitamin B12, CK, Sedimentation rate  side effects for higher pravastatin   Most problematic for her  And  Hopefully with subside when off  But encouraged  To follow through with lipid clinic  Advice , Risk benefit of medication discussed. benefit . doubt pmr  Rheum disease but checking esr also and follow .  -Patient advised to return or notify health care team  if  new concerns arise.  Patient Instructions   Your Bp   Is up today  In office  But  Better  140/64  Continue to make sure in range.  I agree with exploring other optinos for cholesterol  Artery  Health  With lipid clinic . I suspect the muscle aches  will get better  In the next few weeks  .   Checking anemia  Mild and a1c Make sure taking your vit d .   Plan fu depending on results  And 4-6 months     Fermin Yan K. Kaeya Schiffer M.D.

## 2017-02-08 ENCOUNTER — Encounter: Payer: Self-pay | Admitting: Internal Medicine

## 2017-02-08 ENCOUNTER — Ambulatory Visit (INDEPENDENT_AMBULATORY_CARE_PROVIDER_SITE_OTHER): Payer: Medicare Other | Admitting: Internal Medicine

## 2017-02-08 VITALS — BP 140/62 | HR 68 | Temp 97.9°F | Ht 62.0 in | Wt 128.2 lb

## 2017-02-08 DIAGNOSIS — I10 Essential (primary) hypertension: Secondary | ICD-10-CM

## 2017-02-08 DIAGNOSIS — Z789 Other specified health status: Secondary | ICD-10-CM

## 2017-02-08 DIAGNOSIS — M791 Myalgia, unspecified site: Secondary | ICD-10-CM

## 2017-02-08 DIAGNOSIS — E119 Type 2 diabetes mellitus without complications: Secondary | ICD-10-CM | POA: Diagnosis not present

## 2017-02-08 DIAGNOSIS — Z79899 Other long term (current) drug therapy: Secondary | ICD-10-CM | POA: Diagnosis not present

## 2017-02-08 DIAGNOSIS — D649 Anemia, unspecified: Secondary | ICD-10-CM | POA: Diagnosis not present

## 2017-02-08 LAB — SEDIMENTATION RATE: Sed Rate: 13 mm/hr (ref 0–30)

## 2017-02-08 LAB — VITAMIN B12: Vitamin B-12: 307 pg/mL (ref 211–911)

## 2017-02-08 LAB — CBC WITH DIFFERENTIAL/PLATELET
BASOS PCT: 0.6 % (ref 0.0–3.0)
Basophils Absolute: 0 10*3/uL (ref 0.0–0.1)
EOS ABS: 0.4 10*3/uL (ref 0.0–0.7)
Eosinophils Relative: 7.7 % — ABNORMAL HIGH (ref 0.0–5.0)
HCT: 37.3 % (ref 36.0–46.0)
Hemoglobin: 12.6 g/dL (ref 12.0–15.0)
Lymphocytes Relative: 13.9 % (ref 12.0–46.0)
Lymphs Abs: 0.6 10*3/uL — ABNORMAL LOW (ref 0.7–4.0)
MCHC: 33.9 g/dL (ref 30.0–36.0)
MCV: 88.6 fl (ref 78.0–100.0)
MONO ABS: 0.3 10*3/uL (ref 0.1–1.0)
Monocytes Relative: 7 % (ref 3.0–12.0)
NEUTROS ABS: 3.3 10*3/uL (ref 1.4–7.7)
NEUTROS PCT: 70.8 % (ref 43.0–77.0)
PLATELETS: 207 10*3/uL (ref 150.0–400.0)
RBC: 4.2 Mil/uL (ref 3.87–5.11)
RDW: 13 % (ref 11.5–15.5)
WBC: 4.7 10*3/uL (ref 4.0–10.5)

## 2017-02-08 LAB — HEMOGLOBIN A1C: HEMOGLOBIN A1C: 7.4 % — AB (ref 4.6–6.5)

## 2017-02-08 LAB — CK: Total CK: 57 U/L (ref 7–177)

## 2017-02-08 NOTE — Patient Instructions (Addendum)
  Your Bp   Is up today  In office  But  Better  140/64  Continue to make sure in range.  I agree with exploring other optinos for cholesterol  Artery  Health  With lipid clinic . I suspect the muscle aches will get better  In the next few weeks  .   Checking anemia  Mild and a1c Make sure taking your vit d .   Plan fu depending on results  And 4-6 months

## 2017-02-09 LAB — IRON,TIBC AND FERRITIN PANEL
%SAT: 18 % (ref 11–50)
FERRITIN: 49 ng/mL (ref 20–288)
IRON: 59 ug/dL (ref 45–160)
TIBC: 321 ug/dL (ref 250–450)

## 2017-02-14 ENCOUNTER — Ambulatory Visit (INDEPENDENT_AMBULATORY_CARE_PROVIDER_SITE_OTHER): Payer: Medicare Other | Admitting: Pharmacist

## 2017-02-14 ENCOUNTER — Encounter: Payer: Self-pay | Admitting: Pharmacist

## 2017-02-14 DIAGNOSIS — E7800 Pure hypercholesterolemia, unspecified: Secondary | ICD-10-CM

## 2017-02-14 DIAGNOSIS — I10 Essential (primary) hypertension: Secondary | ICD-10-CM | POA: Diagnosis not present

## 2017-02-14 MED ORDER — ROSUVASTATIN CALCIUM 5 MG PO TABS: ORAL_TABLET | ORAL | 3 refills | Status: DC

## 2017-02-14 NOTE — Progress Notes (Signed)
Patient ID: Sharon Wilson                 DOB: [birth year]                    MRN: [id number]     HPI: AVON MOLOCK is a 76 y.o. female patient of Dr. Radford Pax that presents today for lipid evaluation. PMH includes recent R brain CVA in 9/17, DM, HL, carotid artery disease and white coat HTN.Coronary CTA revealed a coronary calcium score of 717, placing her in the 90th percentile. She also had suggestion of distal left main/ostial LAD plaquing of less than 50% and an area in the RP LV that was approximately 50% as well. She underwent cath showing severe 3 vessel ASCAD. She has been intolerant to several statins in the past due to muscle symptoms that keep her from exercising.   She reports she stopped her pravastatin a few days prior to her visit with Dr. Radford Pax. She states that she has tried several statin medications and all caused muscle symptoms. She reports that her symptoms have been slow to improve after stopping pravastatin.   She does not recall trying Zetia or Crestor.  She states she is aware of the injectable medications and very hesitant about starting those. She would prefer to pursue other treatment options if at all possible.   LDL Goal: <70  Current Medications: none Intolerances: lipitor, lovastatin 20mg , simvastatin, pravastatin 80 and 40mg  daily - muscle symptoms that improved after discontinuation of medication      Diet: Most meals prepared from home. Eats mostly chicken and fish. She sauteed most of her meats. Not always eating vegetables the way she should.   Exercise: Has been limited secondary to stroke and heart cath. She is also limited due to pains from statins.   Family History:   Social History: Denies tobacco products and extremely rare alcohol   Labs: 12/14/16:  TC 132, TG 92, HDL 43, LDL 71 - pravastatin   Past Medical History:  Diagnosis Date  . Abnormal echocardiogram    a.07/2016 Echo: EF 50-55%, basal and mid inferolateral AK.  .  Agatston coronary artery calcium score greater than 400    a. 07/2016 Abnl Echo with basal and mid inferolateral AK, EF 50-55%; b. 09/01/2016 Ex MV: EF 46%, hypertensive response, no ST changes, apical thinning with very mild apical ischemia;  c. 09/21/2016 Cardiac CTA: Ca2+ score = 717, 90th percentile. Extensive CAD w/ mod stenosis in distal LM/ostial LAD, 50% RPLV-->rec Cath.  . Anemia    hysterectomy  . CAD (coronary artery disease)    a. 09/01/2016 Ex MV: EF 46%, hypertensive response, no ST changes, apical thinning with very mild apical ischemia;  c. 09/21/2016 Cardiac CTA: Ca2+ score = 717, 90th percentile. Extensive CAD w/ mod stenosis in distal LM/ostial LAD, 50% RPLV-->rec Cath  . Carotid arterial disease w/ left carotid bruit ([place])    a. 07/2016 Carotid U/S: RICA 3-30%, LICA 07-62%.  . Diverticulosis   . Hyperlipidemia   . OA (osteoarthritis)   . Stroke (cerebrum) ([place])    a. 07/2016 L facial droop-->Head CT: small area of low attenuation in the right frontoparietal region c/w acute or subacute lacunar infarct; 07/2016 MRI/A brain: acute infarct of right centrum semiovale and possibly posterior limb of right internal capsule. Mild to moderate multifocal narrowing of the MCA arteries w/o occlusion.  . Type II diabetes mellitus ([place])    a. 07/2016 A1c 7.3.  Marland Kitchen  White coat hypertension     Current Outpatient Prescriptions on File Prior to Visit  Medication Sig Dispense Refill  . acetaminophen (TYLENOL) 325 MG tablet Take 2 tablets (650 mg total) by mouth every 4 (four) hours as needed for mild pain or moderate pain.    Marland Kitchen aspirin EC 81 MG tablet Take 1 tablet (81 mg total) by mouth daily.    . Calcium Carbonate-Vitamin D (OSCAL 500/200 D-3 PO) Take 1 tablet by mouth 2 (two) times daily.     . carvedilol (COREG) 12.5 MG tablet TAKE ONE & ONE-HALF TABLETS BY MOUTH TWICE DAILY WITH A MEAL 270 tablet 2  . clopidogrel (PLAVIX) 75 MG tablet Take 1 tablet (75 mg total) by mouth daily. 90 tablet 3    . isosorbide mononitrate (IMDUR) 30 MG 24 hr tablet Take 0.5 tablets (15 mg total) by mouth daily. 30 tablet 5  . metFORMIN (GLUCOPHAGE) 500 MG tablet TAKE ONE TABLET BY MOUTH IN THE MORNING AND TWO TABS IN THE EVENING (Patient taking differently: Take 500 mg by mouth 3 (three) times daily. TAKE ONE TABLET BY MOUTH IN THE MORNING AND TWO TABS IN THE EVENING) 360 tablet 0  . nitroGLYCERIN (NITROSTAT) 0.4 MG SL tablet Place 1 tablet (0.4 mg total) under the tongue every 5 (five) minutes x 3 doses as needed for chest pain. (Patient not taking: Reported on 02/08/2017) 25 tablet 12  . pravastatin (PRAVACHOL) 40 MG tablet Take 2 tablets (80 mg total) by mouth daily. (Patient not taking: Reported on 02/08/2017) 180 tablet 3   No current facility-administered medications on file prior to visit.     Allergies  Allergen Reactions  . Ace Inhibitors Hives, Swelling and Cough  . Lovastatin Other (See Comments)    Leg cramps  Can take pravastatin  . Norvasc [Amlodipine Besylate]     Light headed and vertigo like  Also may have had hives.     Assessment/Plan: Hyperlipidemia: LDL not at goal <70 and likely will trend up since pt has stopped pravastatin. She is willing to do trial on Crestor 5mg  2 times a week, but would like to wait until muscle symptoms clear from pravastatin so that she can give Crestor a proper trial. She will call when she begins the Crestor to schedule follow up labs and visit. We discussed adding zetia if not at goal and pursuing PCSK9i if unable to tolerate statin. She is in agreement with this plan.   Thank you,  Lelan Pons. Patterson Hammersmith, [facility]  02/14/2017 7:19 AM

## 2017-02-14 NOTE — Patient Instructions (Addendum)
We will plan to start (Crestor) rosuvastatin 5mg  twice weekly (Mondays and Fridays)  Please call [phone] when you decide to start the medication   Cholesterol Cholesterol is a fat. Your body needs a small amount of cholesterol. Cholesterol (plaque) may build up in your blood vessels (arteries). That makes you more likely to have a heart attack or stroke. You cannot feel your cholesterol level. Having a blood test is the only way to find out if your level is high. Keep your test results. Work with your doctor to keep your cholesterol at a good level. What do the results mean?  Total cholesterol is how much cholesterol is in your blood.  LDL is bad cholesterol. This is the type that can build up. Try to have low LDL.  HDL is good cholesterol. It cleans your blood vessels and carries LDL away. Try to have high HDL.  Triglycerides are fat that the body can store or burn for energy. What are good levels of cholesterol?  Total cholesterol below 200.  LDL below 100 is good for people who have health risks. LDL below 70 is good for people who have very high risks.  HDL above 40 is good. It is best to have HDL of 60 or higher.  Triglycerides below 150. How can I lower my cholesterol? Diet  Follow your diet program as told by your doctor.  Choose fish, white meat chicken, or [place] that is roasted or baked. Try not to eat red meat, fried foods, sausage, or lunch meats.  Eat lots of fresh fruits and vegetables.  Choose whole grains, beans, pasta, potatoes, and cereals.  Choose olive oil, corn oil, or canola oil. Only use small amounts.  Try not to eat butter, mayonnaise, shortening, or palm kernel oils.  Try not to eat foods with trans fats.  Choose low-fat or nonfat dairy foods.  Drink skim or nonfat milk.  Eat low-fat or nonfat yogurt and cheeses.  Try not to drink whole milk or cream.  Try not to eat ice cream, egg yolks, or full-fat cheeses.  Healthy desserts include  angel food cake, ginger snaps, animal crackers, hard candy, popsicles, and low-fat or nonfat frozen yogurt. Try not to eat pastries, cakes, pies, and cookies. Exercise  Follow your exercise program as told by your doctor.  Be more active. Try gardening, walking, and taking the stairs.  Ask your doctor about ways that you can be more active. Medicine  Take over-the-counter and prescription medicines only as told by your doctor. This information is not intended to replace advice given to you by your health care provider. Make sure you discuss any questions you have with your health care provider. Document Released: 01/20/2009 Document Revised: 05/25/2016 Document Reviewed: 05/05/2016 Elsevier Interactive Patient Education  2017 [employer].

## 2017-03-30 ENCOUNTER — Other Ambulatory Visit: Payer: Self-pay | Admitting: Internal Medicine

## 2017-03-30 MED ORDER — METFORMIN HCL 500 MG PO TABS: ORAL_TABLET | ORAL | 0 refills | Status: DC

## 2017-03-31 ENCOUNTER — Other Ambulatory Visit: Payer: Self-pay | Admitting: Emergency Medicine

## 2017-03-31 MED ORDER — METFORMIN HCL 500 MG PO TABS: ORAL_TABLET | ORAL | 0 refills | Status: DC

## 2017-06-19 ENCOUNTER — Ambulatory Visit (INDEPENDENT_AMBULATORY_CARE_PROVIDER_SITE_OTHER): Payer: Medicare Other | Admitting: Family Medicine

## 2017-06-19 ENCOUNTER — Encounter: Payer: Self-pay | Admitting: Family Medicine

## 2017-06-19 VITALS — BP 160/74 | HR 76 | Temp 98.4°F | Wt 128.0 lb

## 2017-06-19 DIAGNOSIS — S59911A Unspecified injury of right forearm, initial encounter: Secondary | ICD-10-CM | POA: Diagnosis not present

## 2017-06-19 NOTE — Progress Notes (Signed)
Subjective:    Patient ID: Sharon Wilson, female    DOB: 06/18/1941, 76 y.o.   MRN: [id number]  HPI  Sharon Wilson is a 76 year old female who presents today with an injury to her right arm that occurred one week ago. This occurred when she was catching a bowling ball that was falling off of the rack. She bumped her arm on the rack while catching the ball. Associated swelling and bruising occurred with mild tenderness.  Area has improved with decrease in swelling and bruising. Associated tenderness has also improved. No treatments have been tried at home. She is taking plavix and ASA daily. No aggravating or alleviating factors noted.   Review of Systems  Constitutional: Negative for chills, fatigue and fever.  Respiratory: Negative for cough, shortness of breath and wheezing.   Cardiovascular: Negative for chest pain and palpitations.  Musculoskeletal:       Injury to right forearm  Skin: Negative for rash.  Neurological: Negative for dizziness, weakness, light-headedness and headaches.   Past Medical History:  Diagnosis Date  . Abnormal echocardiogram    a.07/2016 Echo: EF 50-55%, basal and mid inferolateral AK.  . Agatston coronary artery calcium score greater than 400    a. 07/2016 Abnl Echo with basal and mid inferolateral AK, EF 50-55%; b. 09/01/2016 Ex MV: EF 46%, hypertensive response, no ST changes, apical thinning with very mild apical ischemia;  c. 09/21/2016 Cardiac CTA: Ca2+ score = 717, 90th percentile. Extensive CAD w/ mod stenosis in distal LM/ostial LAD, 50% RPLV-->rec Cath.  . Anemia    hysterectomy  . CAD (coronary artery disease)    a. 09/01/2016 Ex MV: EF 46%, hypertensive response, no ST changes, apical thinning with very mild apical ischemia;  c. 09/21/2016 Cardiac CTA: Ca2+ score = 717, 90th percentile. Extensive CAD w/ mod stenosis in distal LM/ostial LAD, 50% RPLV-->rec Cath  . Carotid arterial disease w/ left carotid bruit ([place])    a. 07/2016 Carotid  U/S: RICA 3-47%, LICA 42-59%.  . Diverticulosis   . Hyperlipidemia   . OA (osteoarthritis)   . Stroke (cerebrum) ([place])    a. 07/2016 L facial droop-->Head CT: small area of low attenuation in the right frontoparietal region c/w acute or subacute lacunar infarct; 07/2016 MRI/A brain: acute infarct of right centrum semiovale and possibly posterior limb of right internal capsule. Mild to moderate multifocal narrowing of the MCA arteries w/o occlusion.  . Type II diabetes mellitus ([place])    a. 07/2016 A1c 7.3.  . White coat hypertension      Social History   Social History  . Marital status: Married    Spouse name: N/A  . Number of children: N/A  . Years of education: N/A   Occupational History  . Not on file.   Social History Main Topics  . Smoking status: Never Smoker  . Smokeless tobacco: Never Used  . Alcohol use No  . Drug use: No  . Sexual activity: Not on file   Other Topics Concern  . Not on file   Social History Narrative   Lives in [place] with husband.   Retired.   hh of 2   No pets    Past Surgical History:  Procedure Laterality Date  . ABDOMINAL HYSTERECTOMY     fibroid bleeding  . CARDIAC CATHETERIZATION N/A 09/23/2016   Procedure: Left Heart Cath and Coronary Angiography;  Surgeon: Nelva Bush, MD;  Location: [facility];  Service: Cardiovascular;  Laterality: N/A;  .  CARDIAC CATHETERIZATION N/A 09/23/2016   Procedure: Intravascular Pressure Wire/FFR Study;  Surgeon: Nelva Bush, MD;  Location: [facility];  Service: Cardiovascular;  Laterality: N/A;  . carotid dopplers  11/11   per [facility] clinic  . CATARACT EXTRACTION  2011    Family History  Problem Relation Age of Onset  . Cancer Mother        breast  . Breast cancer Mother   . CAD Neg Hx     Allergies  Allergen Reactions  . Ace Inhibitors Hives, Swelling and Cough  . Lovastatin Other (See Comments)    Leg cramps  Can take pravastatin  . Norvasc [Amlodipine Besylate]     Light  headed and vertigo like  Also may have had hives.     Current Outpatient Prescriptions on File Prior to Visit  Medication Sig Dispense Refill  . acetaminophen (TYLENOL) 325 MG tablet Take 2 tablets (650 mg total) by mouth every 4 (four) hours as needed for mild pain or moderate pain.    Marland Kitchen aspirin EC 81 MG tablet Take 1 tablet (81 mg total) by mouth daily.    . Calcium Carbonate-Vitamin D (OSCAL 500/200 D-3 PO) Take 1 tablet by mouth 2 (two) times daily.     . carvedilol (COREG) 12.5 MG tablet TAKE ONE & ONE-HALF TABLETS BY MOUTH TWICE DAILY WITH A MEAL 270 tablet 2  . clopidogrel (PLAVIX) 75 MG tablet Take 1 tablet (75 mg total) by mouth daily. 90 tablet 3  . isosorbide mononitrate (IMDUR) 30 MG 24 hr tablet Take 0.5 tablets (15 mg total) by mouth daily. 30 tablet 5  . metFORMIN (GLUCOPHAGE) 500 MG tablet TAKE ONE TABLET BY MOUTH IN THE MORNING AND TWO TABS IN THE EVENING 360 tablet 0  . nitroGLYCERIN (NITROSTAT) 0.4 MG SL tablet Place 1 tablet (0.4 mg total) under the tongue every 5 (five) minutes x 3 doses as needed for chest pain. 25 tablet 12  . rosuvastatin (CRESTOR) 5 MG tablet Take 1 tablet twice a week and increase as tolerated (Patient not taking: Reported on 06/19/2017) 30 tablet 3   No current facility-administered medications on file prior to visit.     BP (!) 160/74 (BP Location: Left Arm, Patient Position: Sitting, Cuff Size: Normal)   Pulse 76   Temp 98.4 F (36.9 C) (Oral)   Wt 128 lb (58.1 kg)   SpO2 98%   BMI 23.41 kg/m       Objective:   Physical Exam  Constitutional: She is oriented to person, place, and time. She appears well-developed and well-nourished.  Eyes: Pupils are equal, round, and reactive to light. No scleral icterus.  Cardiovascular: Normal rate and regular rhythm.   Pulmonary/Chest: Effort normal and breath sounds normal. She has no wheezes. She has no rales.  Abdominal: Soft. Bowel sounds are normal. There is no tenderness.  Musculoskeletal:    Ecchymosis and hematoma present on lateral aspect of right forearm. Area is healing and no tenderness to palpation is present. Full ROM with right hand and arm  Neurological: She is alert and oriented to person, place, and time.  Skin: Skin is warm and dry. No rash noted.  Psychiatric: She has a normal mood and affect. Her behavior is normal. Judgment and thought content normal.        Assessment & Plan:  1. Injury of right forearm, initial encounter Exam is reassuring; symptoms are improving; no limitations with ADLs; low suspicion for fracture; suspect hematoma that is healing;  we discussed imaging and patient would like to proceed as she is concerned that this has not resolved over the past week. Imaging ordered and further evaluation and treatment will be determined based upon results.  - DG Forearm Right; Future  Delano Metz, FNP-C

## 2017-06-19 NOTE — Patient Instructions (Signed)
Please go to [facility] - located [address] across the [address] from [place] - in the basement - Hours: 8:30-5:30 PM M-F. Do not need appointment.   I am glad to hear that you are improving! We will obtain an x-ray to determine if a fracture has occurred and further treatment will be determined based upon results of X-ray.

## 2017-06-20 ENCOUNTER — Ambulatory Visit (INDEPENDENT_AMBULATORY_CARE_PROVIDER_SITE_OTHER)
Admission: RE | Admit: 2017-06-20 | Discharge: 2017-06-20 | Disposition: A | Discharge status: Home / Self Care | Payer: Medicare Other | Source: Ambulatory Visit | Attending: Family Medicine | Admitting: Family Medicine

## 2017-06-20 DIAGNOSIS — S59911A Unspecified injury of right forearm, initial encounter: Secondary | ICD-10-CM | POA: Diagnosis not present

## 2017-06-26 ENCOUNTER — Other Ambulatory Visit: Payer: Self-pay | Admitting: Internal Medicine

## 2017-07-12 ENCOUNTER — Ambulatory Visit (HOSPITAL_COMMUNITY)
Admission: RE | Admit: 2017-07-12 | Discharge: 2017-07-12 | Disposition: A | Discharge status: Home / Self Care | Payer: Medicare Other | Source: Ambulatory Visit | Attending: Cardiology | Admitting: Cardiology

## 2017-07-12 DIAGNOSIS — I6523 Occlusion and stenosis of bilateral carotid arteries: Secondary | ICD-10-CM | POA: Insufficient documentation

## 2017-07-12 DIAGNOSIS — I739 Peripheral vascular disease, unspecified: Secondary | ICD-10-CM

## 2017-07-12 DIAGNOSIS — E119 Type 2 diabetes mellitus without complications: Secondary | ICD-10-CM | POA: Insufficient documentation

## 2017-07-12 DIAGNOSIS — Z8673 Personal history of transient ischemic attack (TIA), and cerebral infarction without residual deficits: Secondary | ICD-10-CM | POA: Insufficient documentation

## 2017-07-12 DIAGNOSIS — I2510 Atherosclerotic heart disease of native coronary artery without angina pectoris: Secondary | ICD-10-CM | POA: Insufficient documentation

## 2017-07-12 DIAGNOSIS — I10 Essential (primary) hypertension: Secondary | ICD-10-CM | POA: Diagnosis not present

## 2017-07-12 DIAGNOSIS — I779 Disorder of arteries and arterioles, unspecified: Secondary | ICD-10-CM

## 2017-07-14 ENCOUNTER — Encounter: Payer: Self-pay | Admitting: Cardiology

## 2017-07-14 ENCOUNTER — Telehealth: Payer: Self-pay

## 2017-07-14 DIAGNOSIS — I6523 Occlusion and stenosis of bilateral carotid arteries: Secondary | ICD-10-CM

## 2017-07-14 NOTE — Telephone Encounter (Signed)
Informed patient of results and verbal understanding expressed.  Repeat carotids ordered to be scheduled in 1 year. Patient agrees with treatment plan. 

## 2017-07-14 NOTE — Telephone Encounter (Signed)
-----   Message from Sueanne Margarita, MD sent at 07/14/2017  6:22 AM EDT ----- 40-59% bilateral carotid stenosis - the right has progressed some but still moderate range. Repeat dopplers in 1 year.

## 2017-07-18 ENCOUNTER — Telehealth: Payer: Self-pay

## 2017-07-18 NOTE — Telephone Encounter (Addendum)
**Note De-Identified Pierre Dellarocco Obfuscation** I received a PA request for Clopidogrel for the pt. I called [facility] and s/w Ke[place]. Per Annetta Maw the pt does not have Part D insurance so Clopidogrel is not covered.  I called the pt and she is aware that her insurance does not cover Clopidogrel and that she has no Part D. She states that she has always paid out of pocket for this medication and will continue to. She thanked me for my help.

## 2017-07-24 NOTE — Progress Notes (Signed)
[facility]  PATIENT: Sharon Wilson DOB: [birth year]   REASON FOR VISIT:  follow-up for stroke HISTORY FROM: Patient    HISTORY OF PRESENT ILLNESS:UPDATE 09/18/2018CM Sharon Wilson, 76 year old female returns for follow-up with history of stroke in September 2017. She is currently on Plavix and aspirin for secondary stroke prevention without further stroke TIA symptoms. She has minimal bruising and no bleeding. She has failed pravastatin Lipitor and simvastatin. She was sent to the lipid clinic by cardiology Dr. Radford Pax, she is to start Crestor. Most recent carotid Doppler 07/12/2017 shows R ICA stenosis at 40-59%. Stable L ICA stenosis 40-59%. Greater than 50% ECA stenosis bilaterally. Will be  repeated in one year. Cardiac Catheterization shows severe three-vessel ASCAD. Has been seen by Dr.Gerhardt who is holding off on surgery due to lack of symptoms and negative stress test. She is going to get back to her exercise routine as soon as her myalgias abate. Diabetes is in good control. CBG this morning 118. Most recent hemoglobin A1c 7.4. She returns for reevaluation UPDATE 03/16/2018CM Sharon Wilson, 76 year old female returns for follow-up with a history of stroke MRI of the brain with right BG/CR infarct. Stroke occurred in September 2017. She is currently on Plavix and aspirin for secondary stroke prevention without further stroke or TIA symptoms. She has minimal bruising she is also on pravastatin for her cholesterol, she tells me she has failed several statins in the past and is having some myalgias with this. Dr. Radford Pax follows her lipid panel and she is due for exam week. May need to consider PCSK 9 therapy with multiple failed oral meds. Blood pressure in the office today 130/82. She is back to exercising some and will increase that once the weather gets better.She returns for reevaluation     HISTORY 10/17/17CMPatricia W Robinsonis an 76 y.o.femalewith medical  history significant for type 2 diabetes mellitus, hypertension, and hyperlipidemia who presents to the emergency department 2 days after symptoms occurred for evaluation of dysarthria and transient loss of coordination involving the left hand. Patient was going to bed on the morning of 07/18/2016 at 12:30 AM when she noticed that her left hand was clumsy while flossing her teeth. She went to bed and noted that her speech was odd but when asking her husband he did not feel anything was wrong.. The next day she was talking to her kids and they noted that her speech was off. She presented to her PCP who quickly sent her to the emergency department as he felt she was likely having a stroke. At that time the only symptoms she had was dysarthria. Patient endorses that she takes aspirin 81 mg daily and has not missed any doses. Patient states that she does not smoke. MRI of the brain with right BG/CR infarct. MRA with diffuse narrowing of both middle cerebral arteries without occlusion. Carotid ultrasound with 40-59% left ICA stenosis. 2-D echo EF 50-55%. LDL 147 hemoglobin A1c 7.3. She was placed on Plavix for secondary stroke prevention. She returns to the stroke clinic today for follow-up without further stroke or TIA symptoms. She remains on Plavix with minimal bruising , no  bleeding. Blood pressure elevated today 156/61, patient claims she has whitecoat hypertension. She is currently not exercising she returns for reevaluation    REVIEW OF SYSTEMS: Full 14 system review of systems performed and notable only for those listed, all others are neg:  Constitutional: neg  Cardiovascular: neg Ear/Nose/Throat: neg  Skin: neg Eyes: neg Respiratory: neg Gastroitestinal:  neg  Hematology/Lymphatic: neg  Endocrine: neg Musculoskeletal:muscle aches on statin drugs Allergy/Immunology: neg Neurological:neg Psychiatric: neg Sleep : neg   ALLERGIES: Allergies  Allergen Reactions  . Ace Inhibitors Hives, Swelling  and Cough  . Lovastatin Other (See Comments)    Leg cramps,  NO statins  . Norvasc [Amlodipine Besylate]     Light headed and vertigo like  Also may have had hives.     HOME MEDICATIONS: Outpatient Medications Prior to Visit  Medication Sig Dispense Refill  . acetaminophen (TYLENOL) 325 MG tablet Take 2 tablets (650 mg total) by mouth every 4 (four) hours as needed for mild pain or moderate pain.    Marland Kitchen aspirin EC 81 MG tablet Take 1 tablet (81 mg total) by mouth daily.    . Calcium Carbonate-Vitamin D (OSCAL 500/200 D-3 PO) Take 1 tablet by mouth 2 (two) times daily.     . carvedilol (COREG) 12.5 MG tablet TAKE ONE & ONE-HALF TABLETS BY MOUTH TWICE DAILY WITH A MEAL 270 tablet 2  . clopidogrel (PLAVIX) 75 MG tablet Take 1 tablet (75 mg total) by mouth daily. 90 tablet 3  . isosorbide mononitrate (IMDUR) 30 MG 24 hr tablet Take 0.5 tablets (15 mg total) by mouth daily. 30 tablet 5  . metFORMIN (GLUCOPHAGE) 500 MG tablet TAKE ONE TABLET BY MOUTH IN THE MORNING AND TWO TABS IN THE EVENING 360 tablet 0  . nitroGLYCERIN (NITROSTAT) 0.4 MG SL tablet Place 1 tablet (0.4 mg total) under the tongue every 5 (five) minutes x 3 doses as needed for chest pain. 25 tablet 12  . rosuvastatin (CRESTOR) 5 MG tablet Take 1 tablet twice a week and increase as tolerated (Patient not taking: Reported on 06/19/2017) 30 tablet 3  . metFORMIN (GLUCOPHAGE) 500 MG tablet TAKE ONE TABLET BY MOUTH IN THE MORNING AND TWO TABS IN THE EVENING 360 tablet 0   No facility-administered medications prior to visit.     PAST MEDICAL HISTORY: Past Medical History:  Diagnosis Date  . Abnormal echocardiogram    a.07/2016 Echo: EF 50-55%, basal and mid inferolateral AK.  . Agatston coronary artery calcium score greater than 400    a. 07/2016 Abnl Echo with basal and mid inferolateral AK, EF 50-55%; b. 09/01/2016 Ex MV: EF 46%, hypertensive response, no ST changes, apical thinning with very mild apical ischemia;  c. 09/21/2016  Cardiac CTA: Ca2+ score = 717, 90th percentile. Extensive CAD w/ mod stenosis in distal LM/ostial LAD, 50% RPLV-->rec Cath.  . Anemia    hysterectomy  . CAD (coronary artery disease)    a. 09/01/2016 Ex MV: EF 46%, hypertensive response, no ST changes, apical thinning with very mild apical ischemia;  c. 09/21/2016 Cardiac CTA: Ca2+ score = 717, 90th percentile. Extensive CAD w/ mod stenosis in distal LM/ostial LAD, 50% RPLV-->rec Cath  . 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 Diverticulosis   . Hyperlipidemia   . OA (osteoarthritis)   . Stroke (cerebrum) ([place])    a. 07/2016 L facial droop-->Head CT: small area of low attenuation in the right frontoparietal region c/w acute or subacute lacunar infarct; 07/2016 MRI/A brain: acute infarct of right centrum semiovale and possibly posterior limb of right internal capsule. Mild to moderate multifocal narrowing of the MCA arteries w/o occlusion.  . Type II diabetes mellitus ([place])    a. 07/2016 A1c 7.3.  . White coat hypertension     PAST SURGICAL HISTORY:  Past Surgical History:  Procedure Laterality Date  . ABDOMINAL HYSTERECTOMY     fibroid bleeding  . CARDIAC CATHETERIZATION N/A 09/23/2016   Procedure: Left Heart Cath and Coronary Angiography;  Surgeon: Nelva Bush, MD;  Location: [facility];  Service: Cardiovascular;  Laterality: N/A;  . CARDIAC CATHETERIZATION N/A 09/23/2016   Procedure: Intravascular Pressure Wire/FFR Study;  Surgeon: Nelva Bush, MD;  Location: [facility];  Service: Cardiovascular;  Laterality: N/A;  . carotid dopplers  11/11   per [facility] clinic  . CATARACT EXTRACTION  2011    FAMILY HISTORY: Family History  Problem Relation Age of Onset  . Cancer Mother        breast  . Breast cancer Mother   . CAD Neg Hx     SOCIAL HISTORY: Social History   Social History  . Marital status: Married    Spouse name: N/A  . Number of children:  N/A  . Years of education: N/A   Occupational History  . Not on file.   Social History Main Topics  . Smoking status: Never Smoker  . Smokeless tobacco: Never Used  . Alcohol use No  . Drug use: No  . Sexual activity: Not on file   Other Topics Concern  . Not on file   Social History Narrative   Lives in [place] with husband.   Retired.   hh of 2   No pets     PHYSICAL EXAM  Vitals:   01/20/17 0945  BP: (!) 141/70    Pulse: 68  Weight: 128 lb 12.8 oz (58.4 kg)  Height: 5\' 2"  (1.575 m)   Body mass index is 23.23 kg/m.  Generalized: Well developed, in no acute distress  Head: normocephalic and atraumatic,. Oropharynx benign  Neck: Supple, bil carotid bruits  Cardiac: Regular rate rhythm, no murmur  Musculoskeletal: No deformity   Neurological examination   Mentation: Alert oriented to time, place, history taking. Attention span and concentration appropriate. Recent and remote memory intact.  Follows all commands speech and language fluent.   Cranial nerve II-XII: Pupils were equal round reactive to light extraocular movements were full, visual field were full on confrontational test. Facial sensation and strength were normal. hearing was intact to finger rubbing bilaterally. Uvula tongue midline. head turning and shoulder shrug were normal and symmetric.Tongue protrusion into cheek strength was normal. Motor: normal bulk and tone, full strength in the BUE, BLE, fine finger movements normal, no pronator drift. No focal weakness Sensory: normal and symmetric to light touch, pinprick, and  Vibration, in the upper and lower extremities Coordination: finger-nose-finger, heel-to-shin bilaterally, no dysmetria, no tremor Reflexes: 1+ upper lower and symmetric, plantar responses were flexor bilaterally. Gait and Station: Rising up from seated position without assistance, normal stance,  moderate stride, good arm swing, smooth turning, able to perform tiptoe, and heel walking  without difficulty. Tandem gait is  mildly unsteady. No assistive device  DIAGNOSTIC DATA (LABS, IMAGING, TESTING) - I reviewed patient records, labs, notes, testing and imaging myself where available.  Lab Results  Component Value Date   WBC 4.7 02/08/2017   HGB 12.6 02/08/2017   HCT 37.3 02/08/2017   MCV 88.6 02/08/2017   PLT 207.0 02/08/2017      Component Value Date/Time   NA 140 10/06/2016 0826   K 4.3 10/06/2016 0826   CL 103 10/06/2016 0826   CO2 32 10/06/2016 0826   GLUCOSE 115 (H) 10/06/2016 0826   BUN 13 10/06/2016 0826  CREATININE 0.92 10/06/2016 0826   CREATININE 0.85 09/22/2016 1537   CALCIUM 9.3 10/06/2016 0826   PROT 6.0 10/06/2016 0826   ALBUMIN 4.0 10/06/2016 0826   AST 14 10/06/2016 0826   ALT 32 12/14/2016 0754   ALKPHOS 51 10/06/2016 0826   BILITOT 0.6 10/06/2016 0826   GFRNONAA >60 09/24/2016 0341   GFRAA >60 09/24/2016 0341   Lab Results  Component Value Date   CHOL 132 12/14/2016   HDL 43 12/14/2016   LDLCALC 71 12/14/2016   LDLDIRECT 136.0 05/05/2016   TRIG 92 12/14/2016   CHOLHDL 3.1 12/14/2016   Lab Results  Component Value Date   HGBA1C 7.4 (H) 02/08/2017       ASSESSMENT AND PLAN  76 y.o. year old female  has a past medical history of  Hyperlipidemia;  hypertension. And  follow-up for  stroke MRI of the brain with right BG/CR infarct. MRA with diffuse narrowing of both middle cerebral arteries without occlusion. Most recent carotid Doppler 07/12/2017 shows R ICA stenosis at 40-59%. Stable L ICA stenosis 40-59%. Greater than 50% ECA stenosis bilaterally.      PLAN: Stressed the importance of continued management of risk factors to prevent further stroke Continue Plavix and aspirin for secondary stroke prevention Maintain strict control of hypertension with blood pressure goal below 130/90, today's reading 141/70 continue antihypertensive medications Control of diabetes with hemoglobin A1c below 6.5 followed by primary care most  recent hemoglobin A1c7.4 in April 2018  continue diabetic medications Cholesterol with LDL cholesterol less than 70, followed by Dr. Radford Pax ,  Seen by lipid clinic and to start Crestor.  Exercise by walking,   eat healthy diet with whole grains,  fresh fruits and vegetables Discharge from stroke clinic I spent 80min  in total face to face time with the patient more than 50% of which was spent counseling and coordination of care, reviewing test results reviewing medications and discussing and reviewing the diagnosis of stroke and  risk factor modification. In addition we discussed Crestor and PCSK 9  Therapy. Dennie Bible, [facility], [facility], APRN  [facility] [address], [place], [place] [address] [phone]

## 2017-07-25 ENCOUNTER — Ambulatory Visit (INDEPENDENT_AMBULATORY_CARE_PROVIDER_SITE_OTHER): Payer: Medicare Other | Admitting: Nurse Practitioner

## 2017-07-25 ENCOUNTER — Encounter: Payer: Self-pay | Admitting: Nurse Practitioner

## 2017-07-25 VITALS — BP 141/70 | HR 68 | Wt 127.0 lb

## 2017-07-25 DIAGNOSIS — Z8673 Personal history of transient ischemic attack (TIA), and cerebral infarction without residual deficits: Secondary | ICD-10-CM | POA: Diagnosis not present

## 2017-07-25 DIAGNOSIS — I10 Essential (primary) hypertension: Secondary | ICD-10-CM | POA: Diagnosis not present

## 2017-07-25 DIAGNOSIS — E782 Mixed hyperlipidemia: Secondary | ICD-10-CM | POA: Diagnosis not present

## 2017-07-25 DIAGNOSIS — Z789 Other specified health status: Secondary | ICD-10-CM

## 2017-07-25 NOTE — Patient Instructions (Addendum)
Stressed the importance of continued management of risk factors to prevent further stroke Continue Plavix and aspirin for secondary stroke prevention Maintain strict control of hypertension with blood pressure goal below 130/90, today's reading 141/70 continue antihypertensive medications Control of diabetes with hemoglobin A1c below 6.5 followed by primary care most recent hemoglobin A1c7.4 in April 2018  continue diabetic medications Cholesterol with LDL cholesterol less than 70, followed by Dr. Radford Pax ,  Seen by lipid clinic and to start Crestor.  Exercise by walking,   eat healthy diet with whole grains,  fresh fruits and vegetables Discharge from stroke clinic Stroke Prevention Some medical conditions and behaviors are associated with an increased chance of having a stroke. You may prevent a stroke by making healthy choices and managing medical conditions. How can I reduce my risk of having a stroke?  Stay physically active. Get at least 30 minutes of activity on most or all days.  Do not smoke. It may also be helpful to avoid exposure to secondhand smoke.  Limit alcohol use. Moderate alcohol use is considered to be: ? No more than 2 drinks per day for men. ? No more than 1 drink per day for nonpregnant women.  Eat healthy foods. This involves: ? Eating 5 or more servings of fruits and vegetables a day. ? Making dietary changes that address high blood pressure (hypertension), high cholesterol, diabetes, or obesity.  Manage your cholesterol levels. ? Making food choices that are high in fiber and low in saturated fat, trans fat, and cholesterol may control cholesterol levels. ? Take any prescribed medicines to control cholesterol as directed by your health care provider.  Manage your diabetes. ? Controlling your carbohydrate and sugar intake is recommended to manage diabetes. ? Take any prescribed medicines to control diabetes as directed by your health care provider.  Control  your hypertension. ? Making food choices that are low in salt (sodium), saturated fat, trans fat, and cholesterol is recommended to manage hypertension. ? Ask your health care provider if you need treatment to lower your blood pressure. Take any prescribed medicines to control hypertension as directed by your health care provider. ? If you are 73-66 years of age, have your blood pressure checked every 3-5 years. If you are 56 years of age or older, have your blood pressure checked every year.  Maintain a healthy weight. ? Reducing calorie intake and making food choices that are low in sodium, saturated fat, trans fat, and cholesterol are recommended to manage weight.  Stop drug abuse.  Avoid taking birth control pills. ? Talk to your health care provider about the risks of taking birth control pills if you are over 56 years old, smoke, get migraines, or have ever had a blood clot.  Get evaluated for sleep disorders (sleep apnea). ? Talk to your health care provider about getting a sleep evaluation if you snore a lot or have excessive sleepiness.  Take medicines only as directed by your health care provider. ? For some people, aspirin or blood thinners (anticoagulants) are helpful in reducing the risk of forming abnormal blood clots that can lead to stroke. If you have the irregular heart rhythm of atrial fibrillation, you should be on a blood thinner unless there is a good reason you cannot take them. ? Understand all your medicine instructions.  Make sure that other conditions (such as anemia or atherosclerosis) are addressed. Get help right away if:  You have sudden weakness or numbness of the face, arm, or leg, especially  on one side of the body.  Your face or eyelid droops to one side.  You have sudden confusion.  You have trouble speaking (aphasia) or understanding.  You have sudden trouble seeing in one or both eyes.  You have sudden trouble walking.  You have  dizziness.  You have a loss of balance or coordination.  You have a sudden, severe headache with no known cause.  You have new chest pain or an irregular heartbeat. Any of these symptoms may represent a serious problem that is an emergency. Do not wait to see if the symptoms will go away. Get medical help at once. Call your local emergency services (911 in [place] Do not drive yourself to the hospital. This information is not intended to replace advice given to you by your health care provider. Make sure you discuss any questions you have with your health care provider. Document Released: 12/01/2004 Document Revised: 03/31/2016 Document Reviewed: 04/26/2013 Elsevier Interactive Patient Education  2017 [employer].

## 2017-07-27 NOTE — Progress Notes (Signed)
I reviewed above note and agree with the assessment and plan.  Rosalin Hawking, MD PhD Stroke Neurology 07/27/2017 4:34 PM

## 2017-07-28 ENCOUNTER — Encounter: Payer: Self-pay | Admitting: Internal Medicine

## 2017-08-07 ENCOUNTER — Telehealth (HOSPITAL_COMMUNITY): Payer: Self-pay | Admitting: *Deleted

## 2017-08-07 NOTE — Telephone Encounter (Signed)
Patient given detailed instructions per Myocardial Perfusion Study Information Sheet for the test on 08/09/17. Patient notified to arrive 15 minutes early and that it is imperative to arrive on time for appointment to keep from having the test rescheduled.  If you need to cancel or reschedule your appointment, please call the office within 24 hours of your appointment. . Patient verbalized understanding. Kirstie Peri

## 2017-08-09 ENCOUNTER — Encounter (HOSPITAL_COMMUNITY): Payer: Medicare Other

## 2017-08-14 ENCOUNTER — Telehealth (HOSPITAL_COMMUNITY): Payer: Self-pay | Admitting: *Deleted

## 2017-08-14 NOTE — Telephone Encounter (Signed)
Left message on voicemail per DPR in reference to upcoming appointment scheduled on 08/16/17 at [facility] with detailed instructions given per Myocardial Perfusion Study Information Sheet for the test. LM to arrive 15 minutes early, and that it is imperative to arrive on time for appointment to keep from having the test rescheduled. If you need to cancel or reschedule your appointment, please call the office within 24 hours of your appointment. Failure to do so may result in a cancellation of your appointment, and a $50 no show fee. Phone number given for call back for any questions.

## 2017-08-14 NOTE — Progress Notes (Signed)
Chief Complaint  Patient presents with  . Follow-up    No new complaints. A1c check today    HPI: LORILYNN LEHR 76 y.o. come in for Chronic disease management  LPIPIDS   Went off pravastatin cause of leg aching but  Plan is to try crestor  hasnt started   At this time not wanting to look into  Injectables  Ac not checking bg at this time Anemia mp b;eedomg  Stress test  for tomorrow.  " Doesn't    think going to go well." because of  leg sx she got with   Pravastatin   .  BP  At home ok   113/77 and 140   At other office   Has qwc ht and  Also ht  Seeing  neurology  Almost out of imdur   Given by Dr endcards  No bleeding falling  ROS: See pertinent positives and negatives per HPI.  Past Medical History:  Diagnosis Date  . Abnormal echocardiogram    a.07/2016 Echo: EF 50-55%, basal and mid inferolateral AK.  . Agatston coronary artery calcium score greater than 400    a. 07/2016 Abnl Echo with basal and mid inferolateral AK, EF 50-55%; b. 09/01/2016 Ex MV: EF 46%, hypertensive response, no ST changes, apical thinning with very mild apical ischemia;  c. 09/21/2016 Cardiac CTA: Ca2+ score = 717, 90th percentile. Extensive CAD w/ mod stenosis in distal LM/ostial LAD, 50% RPLV-->rec Cath.  . Anemia    hysterectomy  . CAD (coronary artery disease)    a. 09/01/2016 Ex MV: EF 46%, hypertensive response, no ST changes, apical thinning with very mild apical ischemia;  c. 09/21/2016 Cardiac CTA: Ca2+ score = 717, 90th percentile. Extensive CAD w/ mod stenosis in distal LM/ostial LAD, 50% RPLV-->rec Cath  . 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 Diverticulosis   . Hyperlipidemia   . OA (osteoarthritis)   . Stroke (cerebrum) ([place])    a. 07/2016 L facial droop-->Head CT: small area of low attenuation in the right frontoparietal region c/w acute or subacute lacunar infarct; 07/2016 MRI/A brain: acute infarct of right centrum  semiovale and possibly posterior limb of right internal capsule. Mild to moderate multifocal narrowing of the MCA arteries w/o occlusion.  . Type II diabetes mellitus ([place])    a. 07/2016 A1c 7.3.  . White coat hypertension     Family History  Problem Relation Age of Onset  . Cancer Mother        breast  . Breast cancer Mother   . CAD Neg Hx     Social History   Social History  . Marital status: Married    Spouse name: N/A  . Number of children: N/A  . Years of education: N/A   Social History Main Topics  . Smoking status: Never Smoker  . Smokeless tobacco: Never Used  . Alcohol use No  . Drug use: No  . Sexual activity: Not Asked   Other Topics Concern  . None   Social History Narrative   Lives in [place] with husband.   Retired.   hh of 2   No pets    Outpatient Medications Prior to Visit  Medication Sig Dispense Refill  . acetaminophen (TYLENOL) 325 MG tablet Take 2 tablets (650 mg total) by mouth every 4 (four) hours as needed for mild pain or moderate pain.    Marland Kitchen aspirin EC 81 MG  tablet Take 1 tablet (81 mg total) by mouth daily.    . Calcium Carbonate-Vitamin D (OSCAL 500/200 D-3 PO) Take 1 tablet by mouth 2 (two) times daily.     . carvedilol (COREG) 12.5 MG tablet TAKE ONE & ONE-HALF TABLETS BY MOUTH TWICE DAILY WITH A MEAL 270 tablet 2  . clopidogrel (PLAVIX) 75 MG tablet Take 1 tablet (75 mg total) by mouth daily. 90 tablet 3  . isosorbide mononitrate (IMDUR) 30 MG 24 hr tablet Take 0.5 tablets (15 mg total) by mouth daily. 30 tablet 5  . metFORMIN (GLUCOPHAGE) 500 MG tablet TAKE ONE TABLET BY MOUTH IN THE MORNING AND TWO TABS IN THE EVENING 360 tablet 0  . nitroGLYCERIN (NITROSTAT) 0.4 MG SL tablet Place 1 tablet (0.4 mg total) under the tongue every 5 (five) minutes x 3 doses as needed for chest pain. 25 tablet 12  . rosuvastatin (CRESTOR) 5 MG tablet Take 1 tablet twice a week and increase as tolerated (Patient not taking: Reported on 06/19/2017) 30 tablet 3    No facility-administered medications prior to visit.      EXAM:  BP (!) 162/80 (BP Location: Left Arm, Patient Position: Sitting, Cuff Size: Normal)   Pulse 74   Temp 97.7 F (36.5 C) (Oral)   Wt 125 lb 9.6 oz (57 kg)   BMI 22.97 kg/m   Body mass index is 22.97 kg/m.  GENERAL: vitals reviewed and listed above, alert, oriented, appears well hydrated and in no acute distress HEENT: atraumatic, conjunctiva  clear, no obvious abnormalities on inspection of external nose and ears OP : no lesion edema or exudate  NECK: no obvious masses on inspection palpation  LUNGS: clear to auscultation bilaterally, no wheezes, rales or rhonchi, good air movement CV: HRRR, no clubbing cyanosis or  peripheral edema nl cap refill  MS: moves all extremities without noticeable focal  abnormality PSYCH: pleasant and cooperative, no obvious depression or anxiety Lab Results  Component Value Date   WBC 4.7 02/08/2017   HGB 12.6 02/08/2017   HCT 37.3 02/08/2017   PLT 207.0 02/08/2017   GLUCOSE 142 (H) 08/15/2017   CHOL 132 12/14/2016   TRIG 92 12/14/2016   HDL 43 12/14/2016   LDLDIRECT 136.0 05/05/2016   LDLCALC 71 12/14/2016   ALT 8 08/15/2017   AST 13 08/15/2017   NA 139 08/15/2017   K 4.1 08/15/2017   CL 100 08/15/2017   CREATININE 0.81 08/15/2017   BUN 13 08/15/2017   CO2 32 08/15/2017   TSH 4.15 10/06/2016   INR 1.0 09/22/2016   HGBA1C 7.1 (H) 08/15/2017   MICROALBUR 4.3 (H) 10/06/2016   BP Readings from Last 3 Encounters:  08/15/17 (!) 162/80  07/25/17 (!) 141/70  06/19/17 (!) 160/74    ASSESSMENT AND PLAN:  Discussed the following assessment and plan:  Non-insulin dependent type 2 diabetes mellitus (HCC) - Plan: CMP, Hemoglobin A1c  Essential hypertension - Plan: CMP, Hemoglobin A1c  Medication management - Plan: CMP, Hemoglobin A1c  Statin intolerance  Coronary artery disease of native artery of native heart with stable angina pectoris ([place])  History of CVA in  adulthood  Vascular disease Blood pressure is labile and she reports it normal at home however.Can consider intensification of medicine but she does have a significant white coat component.States that used to goes up to 200 when she's in the office. reviewed importance of statin benefit  .  consider adding vit d extra  To see if helps myalgias -Patient advised  to return or notify health care team  if  new concerns arise.  Patient Instructions  Continue lifestyle intervention healthy eating and exercise .  Will notify you  of labs when available.  Plan fu  Depending on labs .    Or 4-6 months        Standley Brooking. Panosh M.D.  76 y.o. year old female  has a past medical history of  Hyperlipidemia;  hypertension. And  follow-up for  stroke MRI of the brain with right BG/CR infarct. MRA with diffuse narrowing of both middle cerebral arteries without occlusion. Most recent carotid Doppler 07/12/2017 shows R ICA stenosis at 40-59%. Stable L ICA stenosis 40-59%. Greater than 50% ECA stenosis bilaterally.      PLAN: Stressed the importance of continued management of risk factors to prevent further stroke Continue Plavix and aspirin for secondary stroke prevention Maintain strict control of hypertension with blood pressure goal below 130/90, today's reading 141/70 continue antihypertensive medications Control of diabetes with hemoglobin A1c below 6.5 followed by primary care most recent hemoglobin A1c7.4 in April 2018  continue diabetic medications Cholesterol with LDL cholesterol less than 70, followed by Dr. Radford Pax ,  Seen by lipid clinic and to start Crestor.  Exercise by walking,   eat healthy diet with whole grains,  fresh fruits and vegetables Discharge from stroke clinic I spent 74min  in total face to face time with the patient more than 50% of which was spent counseling and coordination of care, reviewing test results reviewing medications and discussing and reviewing the diagnosis of  stroke and  risk factor modification. In addition we discussed Crestor and PCSK 9  Therapy. Dennie Bible, [facility], [facility], APRN  [facility] [address] [place], [place] [address] [phone]

## 2017-08-15 ENCOUNTER — Ambulatory Visit (INDEPENDENT_AMBULATORY_CARE_PROVIDER_SITE_OTHER): Payer: Medicare Other | Admitting: Internal Medicine

## 2017-08-15 ENCOUNTER — Encounter: Payer: Self-pay | Admitting: Internal Medicine

## 2017-08-15 VITALS — BP 162/80 | HR 74 | Temp 97.7°F | Wt 125.6 lb

## 2017-08-15 DIAGNOSIS — I25118 Atherosclerotic heart disease of native coronary artery with other forms of angina pectoris: Secondary | ICD-10-CM | POA: Diagnosis not present

## 2017-08-15 DIAGNOSIS — Z79899 Other long term (current) drug therapy: Secondary | ICD-10-CM

## 2017-08-15 DIAGNOSIS — I999 Unspecified disorder of circulatory system: Secondary | ICD-10-CM | POA: Diagnosis not present

## 2017-08-15 DIAGNOSIS — E119 Type 2 diabetes mellitus without complications: Secondary | ICD-10-CM

## 2017-08-15 DIAGNOSIS — I10 Essential (primary) hypertension: Secondary | ICD-10-CM

## 2017-08-15 DIAGNOSIS — Z8673 Personal history of transient ischemic attack (TIA), and cerebral infarction without residual deficits: Secondary | ICD-10-CM | POA: Diagnosis not present

## 2017-08-15 DIAGNOSIS — Z789 Other specified health status: Secondary | ICD-10-CM

## 2017-08-15 LAB — COMPREHENSIVE METABOLIC PANEL
ALBUMIN: 4.1 g/dL (ref 3.5–5.2)
ALT: 8 U/L (ref 0–35)
AST: 13 U/L (ref 0–37)
Alkaline Phosphatase: 53 U/L (ref 39–117)
BUN: 13 mg/dL (ref 6–23)
CALCIUM: 9.5 mg/dL (ref 8.4–10.5)
CHLORIDE: 100 meq/L (ref 96–112)
CO2: 32 mEq/L (ref 19–32)
CREATININE: 0.81 mg/dL (ref 0.40–1.20)
GFR: 72.81 mL/min (ref >60.00)
Glucose, Bld: 142 mg/dL — ABNORMAL HIGH (ref 70–99)
POTASSIUM: 4.1 meq/L (ref 3.5–5.1)
Sodium: 139 mEq/L (ref 135–145)
Total Bilirubin: 0.7 mg/dL (ref 0.2–1.2)
Total Protein: 6.3 g/dL (ref 6.0–8.3)

## 2017-08-15 LAB — HEMOGLOBIN A1C: HEMOGLOBIN A1C: 7.1 % — AB (ref 4.6–6.5)

## 2017-08-15 NOTE — Patient Instructions (Addendum)
Continue lifestyle intervention healthy eating and exercise .  Will notify you  of labs when available.  Plan fu  Depending on labs .    Or 4-6 months

## 2017-08-16 ENCOUNTER — Telehealth: Payer: Self-pay

## 2017-08-16 ENCOUNTER — Ambulatory Visit (HOSPITAL_COMMUNITY): Payer: Medicare Other

## 2017-08-16 MED ORDER — ISOSORBIDE MONONITRATE ER 30 MG PO TB24: 15.0000 mg | ORAL_TABLET | Freq: Every day | ORAL | 11 refills | Status: DC

## 2017-08-16 NOTE — Telephone Encounter (Signed)
Patient understands that her insurance refuses to pay for yearly stress test. She reports no CP or SOB. She understands she will have a stress test done in a year when her insurance will cover it again.  Per recall report, scheduled patient 10/22 for 6 mo follow-up with Dr. Radford Pax. Per patient request, sent in refill for Imdur. She was grateful for call and agrees with treatment plan.

## 2017-08-16 NOTE — Telephone Encounter (Signed)
-----   Message from Theodoro Parma, RN sent at 08/16/2017  7:30 AM EDT ----- Regarding: FW: Denial for Tomorrow MPI   ----- Message ----- From: Sueanne Margarita, MD Sent: 08/15/2017   3:10 PM To: Lissa Morales, RN Subject: RE: Denial for Tomorrow MPI                    Please let patient know that her insurance refuses to pay for a yearly stress test.  Please find out if she has had any chest discomfort or SOB.  If not then will plan to repeat in 1 year  Traci ----- Message ----- From: Lillia Pauls Sent: 08/15/2017  10:52 AM To: Sueanne Margarita, MD Subject: FW: Denial for Tomorrow MPI                    Dr. Radford Pax  Case denied.  Your entire 02-02-17 OV note was faxed to [facility] when auth was initiated. Note contained CCTA and 09/2016 MPI results.  Hx silent ischemia. No current sxs.  Please advise.  Thanks  Charmaine  ----- Message ----- From: Theodoro Parma, RN Sent: 08/11/2017   3:52 PM To: Rebeca Alert [facility] Pre Cert/Auth, [facility] Subject: FW: Denial for Tomorrow MPI                      ----- Message ----- From: Sueanne Margarita, MD Sent: 08/11/2017   3:24 PM To: Theodoro Parma, RN Subject: RE: Denial for Tomorrow MPI                    Please let them know that she has severe 3 vessel CAD and DM so may have silent ischemia - need to follow with nuclear stress test yearly to rule out silent ischemia  Traci ----- Message ----- From: Theodoro Parma, RN Sent: 08/08/2017   3:31 PM To: Sueanne Margarita, MD Subject: Melton Alar: Denial for Tomorrow MPI                      ----- Message ----- From: Lillia Pauls Sent: 08/08/2017   3:15 PM To: Theodoro Parma, RN Subject: FW: Denial for Tomorrow MPI                     Valetta Fuller  I know you are busy w/DOD.  Can Dr. Radford Pax let me know if she wants this canceled etc?  Thanks  Charmaine  ----- Message ----- From: Lillia Pauls Sent: 08/08/2017   2:06 PM To: Sueanne Margarita, MD Subject: Denial for  Tomorrow MPI                        Dr. Radford Pax  Case is pending for medical review denial.  Your office notes and CCTA were uploaded to [facility]    Pt can have test every 2 years if not having any sxs.  Do you want to cancel test until on or after 09-01-18? Unless pt develops sxs?  If not you will need to do an MD review today@1 [phone] and follow prompts for MD review.    [id number]  Thanks  Charmaine

## 2017-08-19 IMAGING — CT CT HEAD CODE STROKE
3 of 4 series · 17 of 47 positions shown, 20 images · non-contrast
Comparison: 07/20/2016 CT head and 07/20/2016 MR brain

CLINICAL DATA: Code stroke. Recent stroke, tonight at 7744 hours
noticed mouth drooping.

EXAM:
CT HEAD WITHOUT CONTRAST
TECHNIQUE: Contiguous axial images were obtained from the base of the skull
through the vertex without intravenous contrast.

[Series 201: head w/o, idose (1) · axial · non-contrast · 0.41mm/px · z∈[+884,+1014]mm · 11 of 32 slices shown, 14 images]
[im 3/32  brain]
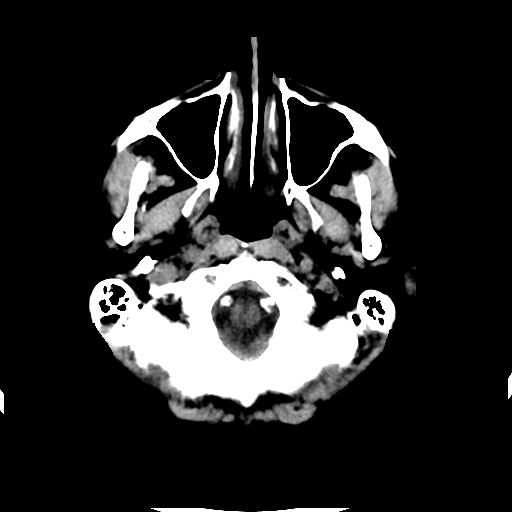
[im 3/32  bone]
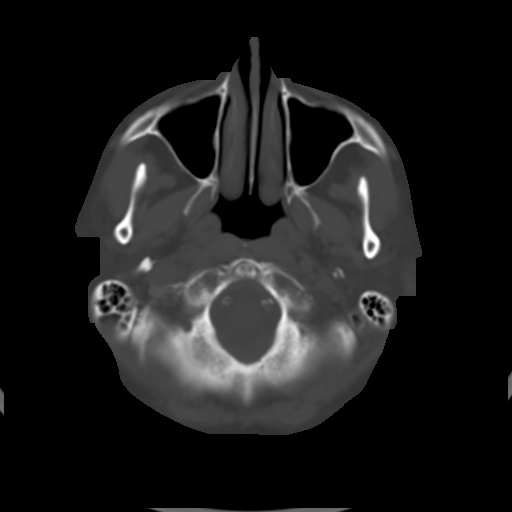
[im 5/32  brain]
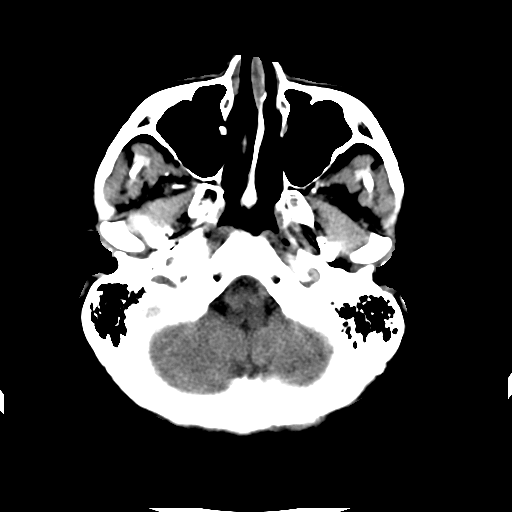
[im 7/32  brain]
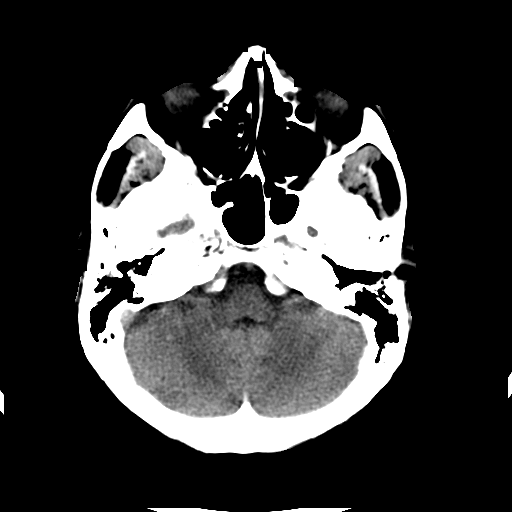
[im 12/32  brain]
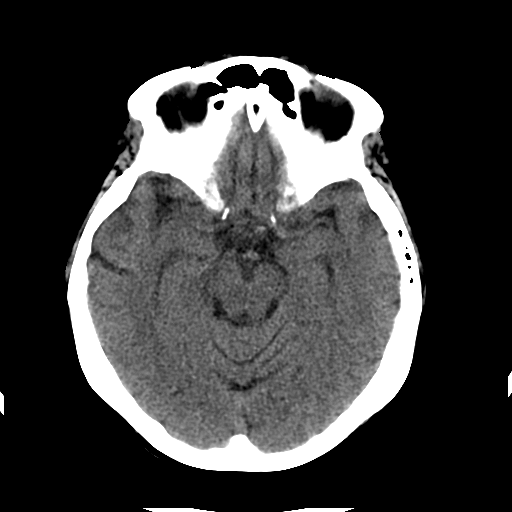
[im 14/32  brain]
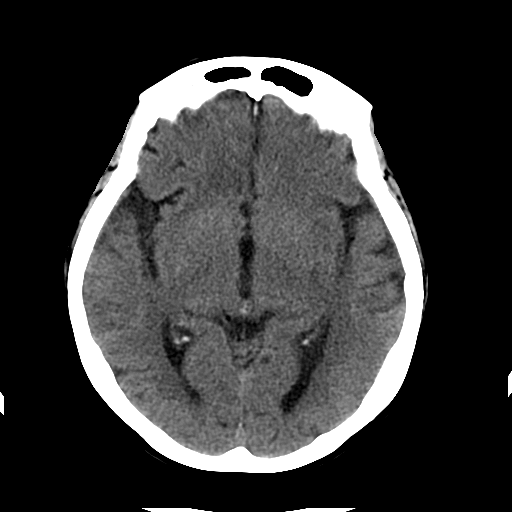
[im 14/32  bone]
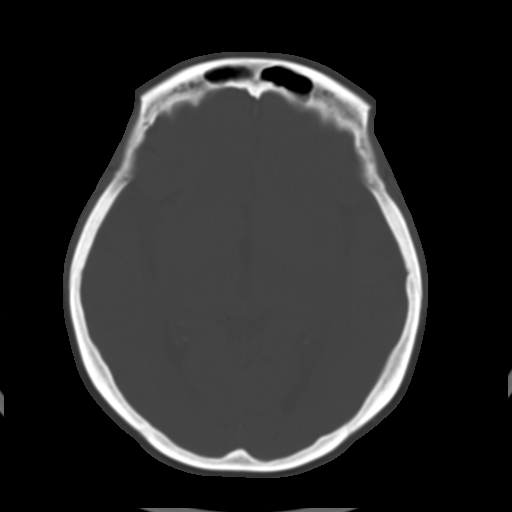
[im 16/32  brain]
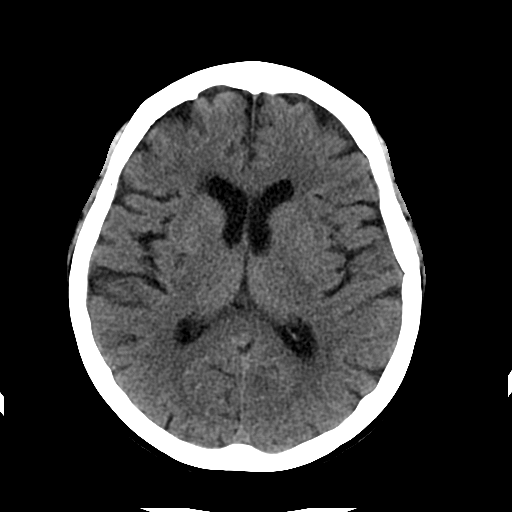
[im 18/32  brain]
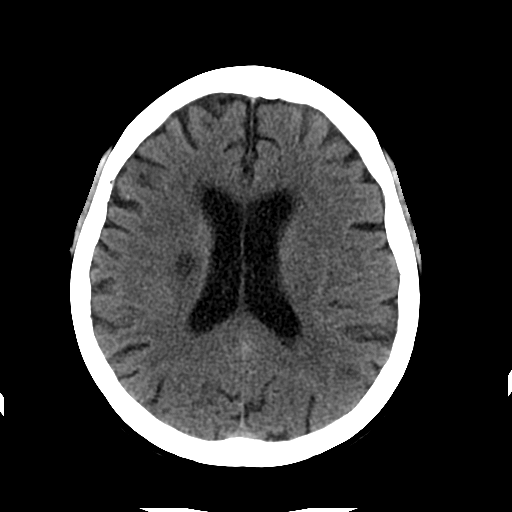
[im 20/32  brain]
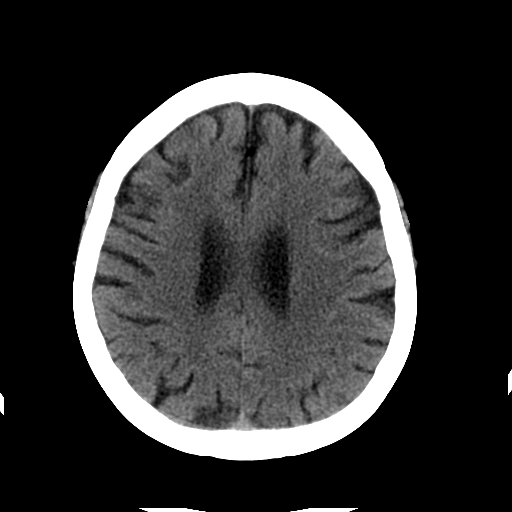
[im 25/32  brain]
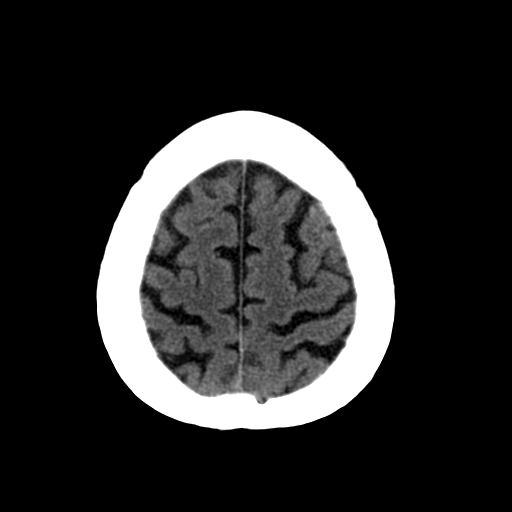
[im 25/32  bone]
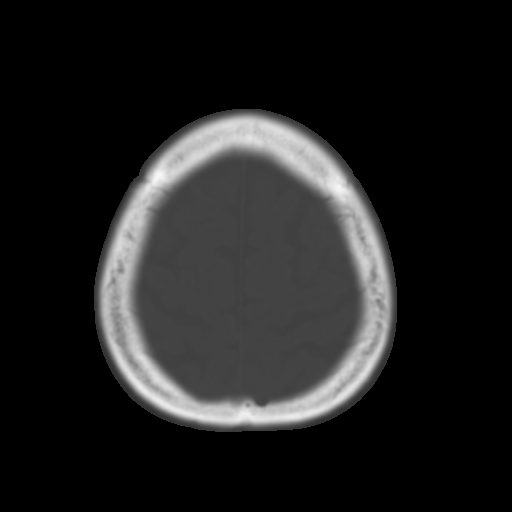
[im 27/32  brain]
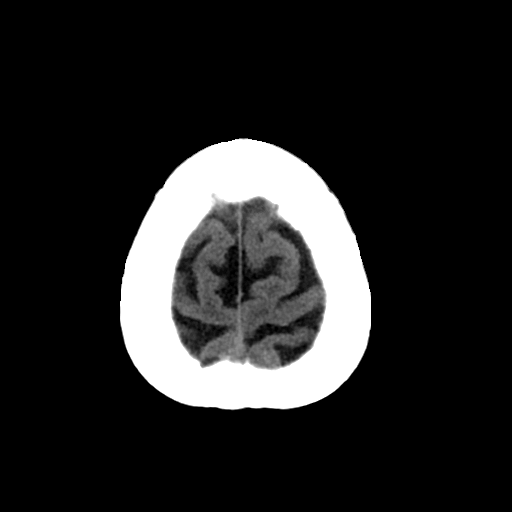
[im 29/32  brain]
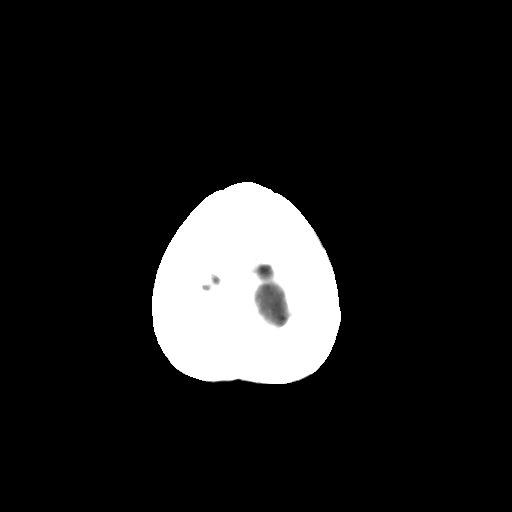

[Series 203: coronal st, idose (1) · coronal · 0.40mm/px · 3 of 69 slices shown]
[im 23/69  brain]
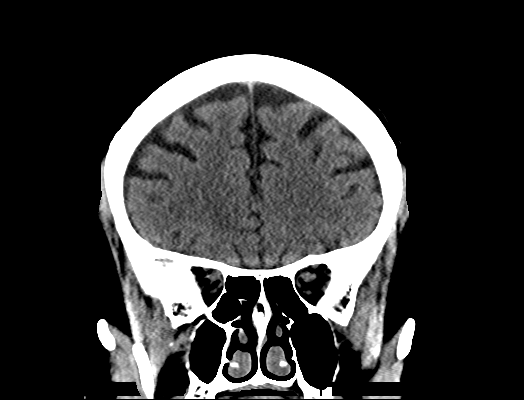
[im 31/69  brain]
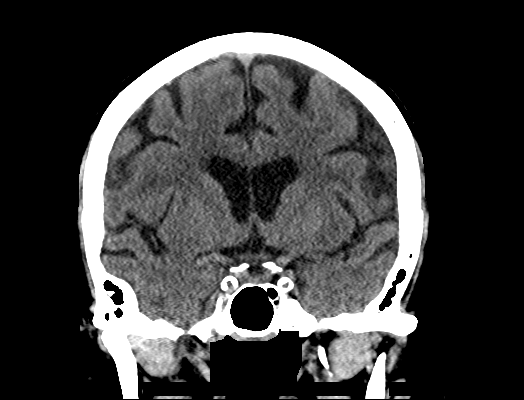
[im 38/69  brain]
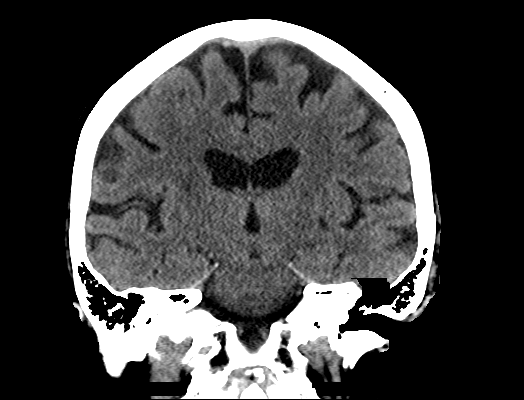

[Series 204: sagittal st, idose (1) · sagittal · 0.40mm/px · 3 of 70 slices shown]
[im 24/70  brain]
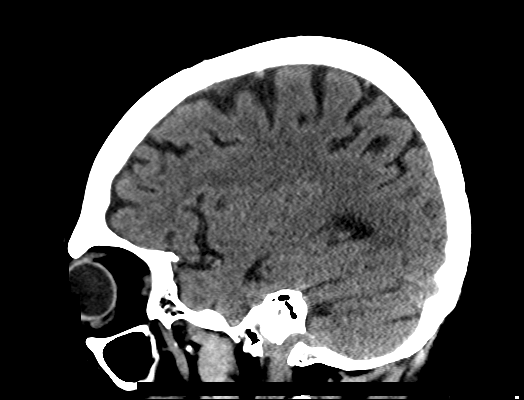
[im 35/70  brain]
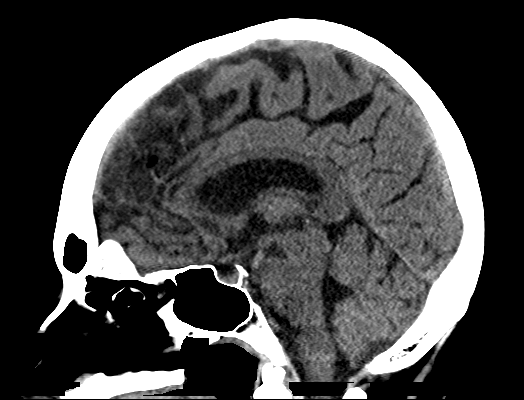
[im 47/70  brain]
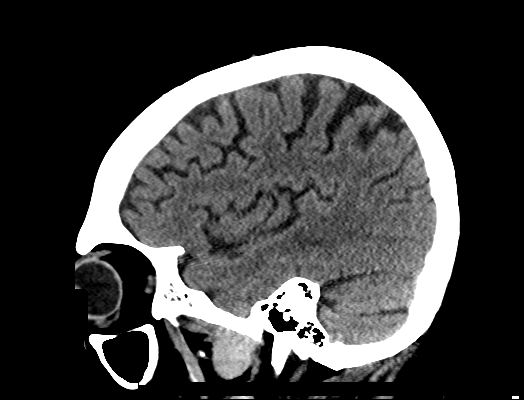

[17 of 47 positions shown; findings below may reference images not displayed]

FINDINGS: The lucency within the right lentiform nucleus extending into corona
radiata and caudate body is increased in conspicuity consistent with
a evolving infarct. No new infarct, intracranial hemorrhage, focal
mass effect, or hydrocephalus is identified. No hyperdense vessel.

Stable lucency in the left lentiform nucleus compatible with chronic
lacunar infarct. Stable mild parenchymal volume loss and chronic
microvascular ischemic changes. The calvarium is unremarkable.
Paranasal sinuses and mastoid air cells are clear. Right
intra-ocular lens replacement. Extensive calcific atherosclerosis of
the internal carotid arteries.

ASPECTS (Alberta Stroke Program Early CT Score)

- Ganglionic level infarction (caudate, lentiform nuclei, internal
capsule, insula, M1-M3 cortex): 6

- Supraganglionic infarction (M4-M6 cortex): 3

Total score (0-10 with 10 being normal): 9
IMPRESSION: 1. Evolving infarct within the right lentiform nucleus extending
into corona radiata/caudate body is unchanged in size in comparison
with diffusion abnormality on MRI. No new infarct or hemorrhage is
identified.
2. ASPECTS is 9
These results were called by telephone at the time of interpretation
on 07/23/2016 at [DATE] to Dr. Miragaia, who verbally acknowledged
these results.

By: Mattiullah Fekadu M.D.

## 2017-08-23 ENCOUNTER — Telehealth: Payer: Self-pay | Admitting: Internal Medicine

## 2017-08-23 NOTE — Telephone Encounter (Signed)
° ° °  Heather with [employer] call to ask for pt last bp and a1c reading    Would like a call back     [phone]   Ext [phone]

## 2017-08-23 NOTE — Telephone Encounter (Signed)
LM for Sharon Wilson x 1

## 2017-08-23 NOTE — Telephone Encounter (Signed)
Spoke with [facility] @ [employer] and gave readings. Nothing further needed at this time.

## 2017-08-25 ENCOUNTER — Encounter: Payer: Self-pay | Admitting: Cardiology

## 2017-08-28 ENCOUNTER — Encounter: Payer: Self-pay | Admitting: Cardiology

## 2017-08-28 ENCOUNTER — Ambulatory Visit (INDEPENDENT_AMBULATORY_CARE_PROVIDER_SITE_OTHER): Payer: Medicare Other | Admitting: Cardiology

## 2017-08-28 VITALS — BP 182/90 | HR 69 | Ht 62.0 in | Wt 127.8 lb

## 2017-08-28 DIAGNOSIS — E7800 Pure hypercholesterolemia, unspecified: Secondary | ICD-10-CM | POA: Diagnosis not present

## 2017-08-28 DIAGNOSIS — I10 Essential (primary) hypertension: Secondary | ICD-10-CM | POA: Diagnosis not present

## 2017-08-28 DIAGNOSIS — I2510 Atherosclerotic heart disease of native coronary artery without angina pectoris: Secondary | ICD-10-CM

## 2017-08-28 NOTE — Patient Instructions (Signed)
Medication Instructions:  Your physician recommends that you continue on your current medications as directed. Please refer to the Current Medication list given to you today.   Labwork: None ordered   Testing/Procedures: Your physician has requested that you have an exercise tolerance test. For further information please visit [email or web address]. Please also follow instruction sheet, as given.    Follow-Up: Your physician wants you to follow-up in: 6 months with Dr. Radford Pax. You will receive a reminder letter in the mail two months in advance. If you don't receive a letter, please call our office to schedule the follow-up appointment.  Your physician recommends that you schedule a follow-up appointment in: lipid clinic with pharmD    Any Other Special Instructions Will Be Listed Below (If Applicable).     If you need a refill on your cardiac medications before your next appointment, please call your pharmacy.

## 2017-08-28 NOTE — Progress Notes (Signed)
Cardiology Office Note:    Date:  08/28/2017   ID:  Sharon Wilson, DOB 1941-03-24, MRN [id number]  PCP:  [name], MD  Cardiologist:  Fransico Him, MD   Referring MD: [name], MD   Chief Complaint  Patient presents with  . Hypertension  . Hyperlipidemia    History of Present Illness:    Sharon Wilson is a 76 y.o. female with a hx of R brain CVA in 9/17, DM, HL, carotid artery disease and white coat HTN. Echo after her CVA demonstrated normal LVF butinferolateral AK prompting an ischemic workup. Nuclear stress test demonstrated apical thinning and very mild apical ischemia. Coronary CTA revealed a coronary calcium score of 717, placing her in the 90th percentile. She also had suggestion of distal left main/ostial LAD plaquing of less than 50% and an area in the RP LV that was approximately 50% as well. She underwent cath showing severe 3 vessel ASCAD.  She was seen by Dr. Servando Snare on 10/03/16 and giventhe patient's relatively recent stroke, lack of symptoms and negative stress test, he has opted to hold off on surgery, as her risk of recurrent stroke would be significantly greater. She was seen again in f/u by Dr. Servando Snare on 10/27/16 and  overall consensus was to continue to treat medically, for now, given her lack of symptoms.   She is here today for followup and is doing well.  She denies any chest pain or pressure, SOB, DOE, PND, orthopnea, LE edema, dizziness, palpitations or syncope. She is compliant with her meds and is tolerating meds with no SE.  She did not tolerate the pravastatin and it was recommended that she go on rosuvastatin but she cannot afford it. Her exercise is limited due to musculoskeletal pain. She has no problems walking across a parking lot but then her ankles start to hurt.  Past Medical History:  Diagnosis Date  . Abnormal echocardiogram    a.07/2016 Echo: EF 50-55%, basal and mid inferolateral AK.  . Agatston coronary artery  calcium score greater than 400    a. 07/2016 Abnl Echo with basal and mid inferolateral AK, EF 50-55%; b. 09/01/2016 Ex MV: EF 46%, hypertensive response, no ST changes, apical thinning with very mild apical ischemia;  c. 09/21/2016 Cardiac CTA: Ca2+ score = 717, 90th percentile. Extensive CAD w/ mod stenosis in distal LM/ostial LAD, 50% RPLV-->rec Cath.  . Anemia    hysterectomy  . CAD (coronary artery disease)    a. 09/01/2016 Ex MV: EF 46%, hypertensive response, no ST changes, apical thinning with very mild apical ischemia;  c. 09/21/2016 Cardiac CTA: Ca2+ score = 717, 90th percentile. Extensive CAD w/ mod stenosis in distal LM/ostial LAD, 50% RPLV-->rec Cath  . 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 Diverticulosis   . Hyperlipidemia   . OA (osteoarthritis)   . Stroke (cerebrum) ([place])    a. 07/2016 L facial droop-->Head CT: small area of low attenuation in the right frontoparietal region c/w acute or subacute lacunar infarct; 07/2016 MRI/A brain: acute infarct of right centrum semiovale and possibly posterior limb of right internal capsule. Mild to moderate multifocal narrowing of the MCA arteries w/o occlusion.  . Type II diabetes mellitus ([place])    a. 07/2016 A1c 7.3.  . White coat hypertension     Past Surgical History:  Procedure Laterality Date  . ABDOMINAL HYSTERECTOMY     fibroid bleeding  .  CARDIAC CATHETERIZATION N/A 09/23/2016   Procedure: Left Heart Cath and Coronary Angiography;  Surgeon: Nelva Bush, MD;  Location: [facility];  Service: Cardiovascular;  Laterality: N/A;  . CARDIAC CATHETERIZATION N/A 09/23/2016   Procedure: Intravascular Pressure Wire/FFR Study;  Surgeon: Nelva Bush, MD;  Location: [facility];  Service: Cardiovascular;  Laterality: N/A;  . carotid dopplers  11/11   per [facility]  . CATARACT EXTRACTION  2011    Current Medications: Current Meds  Medication Sig  .  acetaminophen (TYLENOL) 325 MG tablet Take 2 tablets (650 mg total) by mouth every 4 (four) hours as needed for mild pain or moderate pain.  Marland Kitchen aspirin EC 81 MG tablet Take 1 tablet (81 mg total) by mouth daily.  . Calcium Carbonate-Vitamin D (OSCAL 500/200 D-3 PO) Take 1 tablet by mouth 2 (two) times daily.   . carvedilol (COREG) 12.5 MG tablet TAKE ONE & ONE-HALF TABLETS BY MOUTH TWICE DAILY WITH A MEAL  . clopidogrel (PLAVIX) 75 MG tablet Take 1 tablet (75 mg total) by mouth daily.  . isosorbide mononitrate (IMDUR) 30 MG 24 hr tablet Take 0.5 tablets (15 mg total) by mouth daily.  . metFORMIN (GLUCOPHAGE) 500 MG tablet TAKE ONE TABLET BY MOUTH IN THE MORNING AND TWO TABS IN THE EVENING  . nitroGLYCERIN (NITROSTAT) 0.4 MG SL tablet Place 1 tablet (0.4 mg total) under the tongue every 5 (five) minutes x 3 doses as needed for chest pain.     Allergies:   Ace inhibitors; Lovastatin; and Norvasc [amlodipine besylate]   Social History   Social History  . Marital status: Married    Spouse name: N/A  . Number of children: N/A  . Years of education: N/A   Social History Main Topics  . Smoking status: Never Smoker  . Smokeless tobacco: Never Used  . Alcohol use No  . Drug use: No  . Sexual activity: Not Asked   Other Topics Concern  . None   Social History Narrative   Lives in [place] with husband.   Retired.   hh of 2   No pets     Family History: The patient's family history includes Breast cancer in her mother; Cancer in her mother. There is no history of CAD.  ROS:   Please see the history of present illness.    ROS  All other systems reviewed and negative.   EKGs/Labs/Other Studies Reviewed:    The following studies were reviewed today: none  EKG:  EKG is ordered today and showed NSR at 69bpm with LAD and no ST changes  Recent Labs: 10/06/2016: TSH 4.15 02/08/2017: Hemoglobin 12.6; Platelets 207.0 08/15/2017: ALT 8; BUN 13; Creatinine, Ser 0.81; Potassium 4.1; Sodium 139     Recent Lipid Panel    Component Value Date/Time   CHOL 132 12/14/2016 0754   TRIG 92 12/14/2016 0754   HDL 43 12/14/2016 0754   CHOLHDL 3.1 12/14/2016 0754   CHOLHDL 4 10/06/2016 0826   VLDL 27.0 10/06/2016 0826   LDLCALC 71 12/14/2016 0754   LDLDIRECT 136.0 05/05/2016 0816    Physical Exam:    VS:  BP (!) 182/90   Pulse 69   Ht 5\' 2"  (1.575 m)   Wt 127 lb 12.8 oz (58 kg)   BMI 23.37 kg/m     Wt Readings from Last 3 Encounters:  08/28/17 127 lb 12.8 oz (58 kg)  08/15/17 125 lb 9.6 oz (57 kg)  07/25/17 127 lb (57.6 kg)  GEN:  Well nourished, well developed in no acute distress HEENT: Normal NECK: No JVD; No carotid bruits LYMPHATICS: No lymphadenopathy CARDIAC: RRR, no murmurs, rubs, gallops RESPIRATORY:  Clear to auscultation without rales, wheezing or rhonchi  ABDOMEN: Soft, non-tender, non-distended MUSCULOSKELETAL:  No edema; No deformity  SKIN: Warm and dry NEUROLOGIC:  Alert and oriented x 3 PSYCHIATRIC:  Normal affect   ASSESSMENT:    1. Coronary artery disease involving native coronary artery of native heart without angina pectoris   2. Essential hypertension   3. Pure hypercholesterolemia    PLAN:    In order of problems listed above:  1.  ASCAD - severe 3 vessel ASCAD on medical management due to CVA earlier this year.  She has no anginal symptoms.  She was evaluated by Dr. Servando Snare a year ago and recommended continued medical management unless she has symptoms.  She will continue on ASA 81mg  daily, Plavix 75mg  daily, Imdur 30mg  daily and BB.  Her insurance refused a regular stress myoview because she had no symptoms but she has a borderline distal LM lesion and we need to assess for exertional symptoms.  I have recommended an ETT as her EKG is norma.   2.  HTN - BP is poorly controlled on exam today.  At home it runs 115-125/59mmHg.  She will continue on Carvedilol 12.5mg  BID.  3.  Hyperlipidemia - LDL goal < 70.  She did not tolerate pravastatin  and cannot tolerate rosuvastatin.  I will refer her to lipid clinic for further recommendations.    Medication Adjustments/Labs and Tests Ordered: Current medicines are reviewed at length with the patient today.  Concerns regarding medicines are outlined above.  No orders of the defined types were placed in this encounter.  No orders of the defined types were placed in this encounter.   Signed, Fransico Him, MD  08/28/2017 2:11 PM    [place]

## 2017-08-29 ENCOUNTER — Telehealth: Payer: Self-pay | Admitting: Internal Medicine

## 2017-08-29 NOTE — Telephone Encounter (Signed)
      Blood sugar stable  a1c is 7.1  If BP not at goal at home Get advice from cardiology as to medication adjustment.  Fu with med in 4-6 mos as planned    Results reviewed with patient, expressed understanding. Nothing further needed.

## 2017-08-29 NOTE — Telephone Encounter (Signed)
Pt is returning  Sharon Wilson

## 2017-09-18 NOTE — Progress Notes (Deleted)
Patient ID: Sharon Wilson                 DOB: Mar 25, 1941                    MRN: [id number]     HPI: Sharon Wilson is a 76 y.o. female patient referred to lipid clinic by Dr. Radford Pax. PMH is significant for recent R brain CVA in 9/17, DM, HL, carotid artery disease and white coat HTN.Coronary CTA revealed a coronary calcium score of 717, placing her in the 90th percentile. She also had suggestion of distal left main/ostial LAD plaquing of less than 50% and an area in the RP LV that was approximately 50% as well. She underwent cath showing severe 3 vessel ASCAD. She has been intolerant to several statins in the past due to muscle symptoms that keep her from exercising.   Current Medications:  Intolerances:  Risk Factors:  LDL goal: < 70 mg/dL  Diet:   Exercise:   Family History:   Social History:   Labs:  Past Medical History:  Diagnosis Date  . Abnormal echocardiogram    a.07/2016 Echo: EF 50-55%, basal and mid inferolateral AK.  . Agatston coronary artery calcium score greater than 400    a. 07/2016 Abnl Echo with basal and mid inferolateral AK, EF 50-55%; b. 09/01/2016 Ex MV: EF 46%, hypertensive response, no ST changes, apical thinning with very mild apical ischemia;  c. 09/21/2016 Cardiac CTA: Ca2+ score = 717, 90th percentile. Extensive CAD w/ mod stenosis in distal LM/ostial LAD, 50% RPLV-->rec Cath.  . Anemia    hysterectomy  . CAD (coronary artery disease)    a. 09/01/2016 Ex MV: EF 46%, hypertensive response, no ST changes, apical thinning with very mild apical ischemia;  c. 09/21/2016 Cardiac CTA: Ca2+ score = 717, 90th percentile. Extensive CAD w/ mod stenosis in distal LM/ostial LAD, 50% RPLV-->rec Cath  . 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 Diverticulosis   . Hyperlipidemia   . OA (osteoarthritis)   . Stroke (cerebrum) ([place])    a. 07/2016 L facial droop-->Head CT: small area of low attenuation  in the right frontoparietal region c/w acute or subacute lacunar infarct; 07/2016 MRI/A brain: acute infarct of right centrum semiovale and possibly posterior limb of right internal capsule. Mild to moderate multifocal narrowing of the MCA arteries w/o occlusion.  . Type II diabetes mellitus ([place])    a. 07/2016 A1c 7.3.  Marland Kitchen White coat hypertension     Current Outpatient Medications on File Prior to Visit  Medication Sig Dispense Refill  . acetaminophen (TYLENOL) 325 MG tablet Take 2 tablets (650 mg total) by mouth every 4 (four) hours as needed for mild pain or moderate pain.    Marland Kitchen aspirin EC 81 MG tablet Take 1 tablet (81 mg total) by mouth daily.    . Calcium Carbonate-Vitamin D (OSCAL 500/200 D-3 PO) Take 1 tablet by mouth 2 (two) times daily.     . carvedilol (COREG) 12.5 MG tablet TAKE ONE & ONE-HALF TABLETS BY MOUTH TWICE DAILY WITH A MEAL 270 tablet 2  . clopidogrel (PLAVIX) 75 MG tablet Take 1 tablet (75 mg total) by mouth daily. 90 tablet 3  . isosorbide mononitrate (IMDUR) 30 MG 24 hr tablet Take 0.5 tablets (15 mg total) by mouth daily. 15 tablet 11  . metFORMIN (GLUCOPHAGE) 500 MG tablet TAKE ONE TABLET BY MOUTH IN  THE MORNING AND TWO TABS IN THE EVENING 360 tablet 0  . nitroGLYCERIN (NITROSTAT) 0.4 MG SL tablet Place 1 tablet (0.4 mg total) under the tongue every 5 (five) minutes x 3 doses as needed for chest pain. 25 tablet 12   No current facility-administered medications on file prior to visit.     Allergies  Allergen Reactions  . Ace Inhibitors Hives, Swelling and Cough  . Lovastatin Other (See Comments)    Leg cramps,  NO statins  . Norvasc [Amlodipine Besylate]     Light headed and vertigo like  Also may have had hives.     Assessment/Plan:  1. Hyperlipidemia -

## 2017-09-19 ENCOUNTER — Ambulatory Visit (INDEPENDENT_AMBULATORY_CARE_PROVIDER_SITE_OTHER): Payer: Medicare Other | Admitting: Pharmacist

## 2017-09-19 VITALS — BP 154/78

## 2017-09-19 DIAGNOSIS — I10 Essential (primary) hypertension: Secondary | ICD-10-CM | POA: Diagnosis not present

## 2017-09-19 NOTE — Patient Instructions (Signed)
It was nice to meet you today  I will send a request to your insurance to see if they will cover rosuvastatin (Crestor) for your cholesterol. If they do, I would like you to start taking Crestor 5mg  3 days a week on Monday, Wednesday, and Friday  If they do not cover the Crestor, we can try ezetimibe (Zetia) 10mg  once daily

## 2017-09-19 NOTE — Progress Notes (Signed)
Patient ID: Sharon Wilson                 DOB: [birth year]                    MRN: [id number]     HPI: Sharon Wilson is a 76 y.o. female patient referred to lipid clinic by Dr. Radford Pax. PMH is significant for recent R brain CVA in 9/17, DM, HLD, carotid artery disease and white coat HTN. Coronary CTA revealed a coronary calcium score of 717, placing her in the 90th percentile. She also had suggestion of distal left main/ostial LAD plaquing of less than 50% and an area in the RP LV that was approximately 50% as well. She underwent cath showing severe 3 vessel ASCAD.She has a history of statin intolerance and presents to lipid clinic for further management.  Pt reports trying multiple statins in the past, including pravastatin, lovastatin, and atorvastatin. She would experience severe myalgias in her legs that affected her ability to walk. Most recently, she took pravastatin 40mg  and 80mg , both of which caused her significant leg pain. The 80mg  dose also caused aches in her shoulders, however this symptom resolved upon statin discontinuation. She stopped taking pravastatin 6 months ago and continues to have residual aches in her thighs, shins, and ankles particularly when she walks. She has noticed mild improvement with statin discontinuation.  She has Zetia and Welchol listed under previous medications, however she does not recall trying either of these recently. Most recently, she was started on rosuvastatin 5mg  however her monthly copay is $300 - confirmed that rosuvastatin is somehow not on her formulary ([employer] - [employer] plan). She does not want to take injectable medication for her cholesterol since she already has to check her blood sugar every day.  Current Medications: none Intolerances: pravastatin 40mg  and 80mg  daily, lovastatin 20mg  daily, atorvastatin - myalgias in legs, rosuvastatin 5mg  2x per week, Zetia 10mg  daily, Welchol 1250mg  BID Risk Factors: CVA, 3 vessel  CAD, DM, HTN, age LDL goal: 70mg /dL  Diet: Cereal with skim milk. Salads with chicken. Drinks water. Avoids a lot of fruit due to diabetes. Makes her own soup. Avoids red meat.   Exercise: Limited due to musculoskeletal pain  Family History: The patient's family history includes Breast cancer in her mother; Cancer in her mother. There is no history of CAD.  Social History: Denies tobacco, alcohol, and illicit drug use  Labs: 12/14/16: TC 132, TG 92, HDL 43, LDL 71 (taking pravastatin 80mg  daily)  10/06/16: TC 148, TG 135, HDL 34.5, LDL 87 (on pravastatin 40mg  daily)  Past Medical History:  Diagnosis Date  . Abnormal echocardiogram    a.07/2016 Echo: EF 50-55%, basal and mid inferolateral AK.  . Agatston coronary artery calcium score greater than 400    a. 07/2016 Abnl Echo with basal and mid inferolateral AK, EF 50-55%; b. 09/01/2016 Ex MV: EF 46%, hypertensive response, no ST changes, apical thinning with very mild apical ischemia;  c. 09/21/2016 Cardiac CTA: Ca2+ score = 717, 90th percentile. Extensive CAD w/ mod stenosis in distal LM/ostial LAD, 50% RPLV-->rec Cath.  . Anemia    hysterectomy  . CAD (coronary artery disease)    a. 09/01/2016 Ex MV: EF 46%, hypertensive response, no ST changes, apical thinning with very mild apical ischemia;  c. 09/21/2016 Cardiac CTA: Ca2+ score = 717, 90th percentile. Extensive CAD w/ mod stenosis in distal LM/ostial LAD, 50% RPLV-->rec Cath  . Carotid arterial disease  w/ left carotid bruit (HCC)    bilateral 40-59% stenosis by dopplers 2018  . Diverticulosis   . Hyperlipidemia   . OA (osteoarthritis)   . Stroke (cerebrum) ([place])    a. 07/2016 L facial droop-->Head CT: small area of low attenuation in the right frontoparietal region c/w acute or subacute lacunar infarct; 07/2016 MRI/A brain: acute infarct of right centrum semiovale and possibly posterior limb of right internal capsule. Mild to moderate multifocal narrowing of the MCA arteries w/o  occlusion.  . Type II diabetes mellitus ([place])    a. 07/2016 A1c 7.3.  Marland Kitchen White coat hypertension     Current Outpatient Medications on File Prior to Visit  Medication Sig Dispense Refill  . acetaminophen (TYLENOL) 325 MG tablet Take 2 tablets (650 mg total) by mouth every 4 (four) hours as needed for mild pain or moderate pain.    Marland Kitchen aspirin EC 81 MG tablet Take 1 tablet (81 mg total) by mouth daily.    . Calcium Carbonate-Vitamin D (OSCAL 500/200 D-3 PO) Take 1 tablet by mouth 2 (two) times daily.     . carvedilol (COREG) 12.5 MG tablet TAKE ONE & ONE-HALF TABLETS BY MOUTH TWICE DAILY WITH A MEAL 270 tablet 2  . clopidogrel (PLAVIX) 75 MG tablet Take 1 tablet (75 mg total) by mouth daily. 90 tablet 3  . isosorbide mononitrate (IMDUR) 30 MG 24 hr tablet Take 0.5 tablets (15 mg total) by mouth daily. 15 tablet 11  . metFORMIN (GLUCOPHAGE) 500 MG tablet TAKE ONE TABLET BY MOUTH IN THE MORNING AND TWO TABS IN THE EVENING 360 tablet 0  . nitroGLYCERIN (NITROSTAT) 0.4 MG SL tablet Place 1 tablet (0.4 mg total) under the tongue every 5 (five) minutes x 3 doses as needed for chest pain. 25 tablet 12   No current facility-administered medications on file prior to visit.     Allergies  Allergen Reactions  . Ace Inhibitors Hives, Swelling and Cough  . Lovastatin Other (See Comments)    Leg cramps,  NO statins  . Norvasc [Amlodipine Besylate]     Light headed and vertigo like  Also may have had hives.     Assessment/Plan:  1. Hyperlipidemia - Most recent lipid panel in February 2018 shows LDL of 71 at goal <70 due to hx of ASCVD, however pt was taking pravastatin at that time. She is intolerant to pravastatin, lovastatin, and atorvastatin. She does not want to take any injectable medication for her cholesterol. Confirmed that rosuvastatin is not on her insurance formulary (copay $300 per month), however given previous intolerances, this is the best option to bring her cholesterol to goal. Will  submit a formulary exception request for rosuvastatin and plan to start 5mg  on MWF if approved. If not, will start Zetia 10mg  daily. Will call pt with update once insurance coverage is available.   Megan E. Supple, PharmD, CPP, [facility] [address], [place], [place] [address] Phone: [phone]; Fax: [phone] 09/19/2017 2:54 PM

## 2017-09-20 ENCOUNTER — Telehealth: Payer: Self-pay | Admitting: Pharmacist

## 2017-09-20 MED ORDER — ROSUVASTATIN CALCIUM 5 MG PO TABS: ORAL_TABLET | ORAL | 11 refills | Status: DC

## 2017-09-20 NOTE — Telephone Encounter (Signed)
Called insurance to do a formulary exception on generic rosuvastatin and was told by insurance that it's covered. Pharmacy stated it wasn't on formulary and was $300 per month. Insurance stated that they had no prescription medications in their claim history and her $405 deductible had not been touched for the year. Called pharmacy again because the only way this would make sense is if they were running all of her medications through an incorrect insurance. They did not have patient's correct insurance on file - she has been paying cash the entire year and was not aware. Called pt to let her know - rosuvastatin is currently $80/month (usually cheaper but still needs to pay towards her deductible). She is aware and will start rosuvastatin. All other medications should be cheaper now as well.

## 2017-09-21 ENCOUNTER — Other Ambulatory Visit: Payer: Self-pay | Admitting: Internal Medicine

## 2017-09-21 ENCOUNTER — Ambulatory Visit (INDEPENDENT_AMBULATORY_CARE_PROVIDER_SITE_OTHER): Payer: Medicare Other

## 2017-09-21 DIAGNOSIS — E7800 Pure hypercholesterolemia, unspecified: Secondary | ICD-10-CM | POA: Diagnosis not present

## 2017-09-21 DIAGNOSIS — I10 Essential (primary) hypertension: Secondary | ICD-10-CM

## 2017-09-21 DIAGNOSIS — I2510 Atherosclerotic heart disease of native coronary artery without angina pectoris: Secondary | ICD-10-CM

## 2017-09-21 LAB — EXERCISE TOLERANCE TEST
CHL CUP STRESS STAGE 1 GRADE: 0 %
CHL CUP STRESS STAGE 1 HR: 84 {beats}/min
CHL CUP STRESS STAGE 2 DBP: 84 mmHg
CHL CUP STRESS STAGE 2 GRADE: 0 %
CHL CUP STRESS STAGE 2 HR: 74 {beats}/min
CHL CUP STRESS STAGE 3 GRADE: 0 %
CHL CUP STRESS STAGE 4 SPEED: 1 mph
CHL CUP STRESS STAGE 5 DBP: 76 mmHg
CHL CUP STRESS STAGE 5 GRADE: 10 %
CHL CUP STRESS STAGE 5 SPEED: 1.7 mph
CHL CUP STRESS STAGE 6 GRADE: 12 %
CHL CUP STRESS STAGE 6 HR: 115 {beats}/min
CHL CUP STRESS STAGE 6 SPEED: 2.5 mph
CHL CUP STRESS STAGE 7 DBP: 69 mmHg
CHL CUP STRESS STAGE 7 SBP: 211 mmHg
CSEPED: 5 min
CSEPEW: 7 METS
CSEPPHR: 115 {beats}/min
Exercise duration (sec): 17 s
MPHR: 144 {beats}/min
Percent HR: 80 %
Percent of predicted max HR: 80 %
RPE: 16
Rest HR: 72 {beats}/min
Stage 1 Speed: 0 mph
Stage 2 SBP: 191 mmHg
Stage 2 Speed: 0 mph
Stage 3 HR: 76 {beats}/min
Stage 3 Speed: 1 mph
Stage 4 Grade: 0.1 %
Stage 4 HR: 76 {beats}/min
Stage 5 HR: 100 {beats}/min
Stage 5 SBP: 201 mmHg
Stage 7 Grade: 0 %
Stage 7 HR: 100 {beats}/min
Stage 7 Speed: 0 mph
Stage 8 DBP: 80 mmHg
Stage 8 Grade: 0 %
Stage 8 HR: 76 {beats}/min
Stage 8 SBP: 205 mmHg
Stage 8 Speed: 0 mph

## 2017-09-21 NOTE — Telephone Encounter (Signed)
Medication filled to pharmacy as requested.   

## 2017-09-27 ENCOUNTER — Telehealth: Payer: Self-pay

## 2017-09-27 DIAGNOSIS — I2510 Atherosclerotic heart disease of native coronary artery without angina pectoris: Secondary | ICD-10-CM

## 2017-09-27 NOTE — Telephone Encounter (Signed)
Notes recorded by Teressa Senter, RN on 09/27/2017 at 12:59 PM EST Called patient to informed patient of the high risk of MI associated with not having a lexiscan. Patient is aware of the risk and is very apprehensive to move forward with test due to past experience with a cardiac cath. Educated the patient on the test and she stated she would move forward with having the test and if she feels uncomfortable with having it she would call back to cancel. Informed her that she would receive a call from our schedulers to make that appointment. Patient in agreement with plan and thanked me for the call. ------  Notes recorded by Sueanne Margarita, MD on 09/26/2017 at 8:39 PM EST Please let her know that the stress test is not accurate since she did not reach her target heart rate and we cannot assess whether that LM lesion has worsened which could put her at risk of a fatal MI ------  Notes recorded by Teressa Senter, RN on 09/26/2017 at 2:11 PM EST Patient made aware of results. Patient is refusing lexiscan myoview. Patient verbalizes understanding and thanked me for the call.

## 2017-10-01 ENCOUNTER — Other Ambulatory Visit: Payer: Self-pay | Admitting: Internal Medicine

## 2017-10-03 ENCOUNTER — Telehealth (HOSPITAL_COMMUNITY): Payer: Self-pay | Admitting: Cardiology

## 2017-10-03 NOTE — Telephone Encounter (Signed)
I called pt and spoke with her to schedule Lexiscan that was requested by Dr. Radford Pax. Patient stated that this test is not covered by her insurance along with voicing that did not want this test now due to the holidays and not having time. I expressed understanding.

## 2017-10-06 ENCOUNTER — Telehealth: Payer: Self-pay | Admitting: Cardiology

## 2017-10-06 NOTE — Telephone Encounter (Signed)
Megan RN Case M[facility]) is calling to get Sharon Wilson last Blood Pressure Reading . Please call

## 2017-10-09 NOTE — Telephone Encounter (Signed)
Left message, Per DPR on file unable to share health information without patient's approval. Informed her to call back with any questions or concerns.

## 2017-10-10 ENCOUNTER — Encounter: Payer: Self-pay | Admitting: Cardiology

## 2017-10-21 IMAGING — DX DG FINGER RING 2+V*L*
3 series · 3 of 3 positions shown · non-contrast
Comparison: None.

CLINICAL DATA: Laceration to ring finger

EXAM:
LEFT RING FINGER 2+V

[x finger pa left]
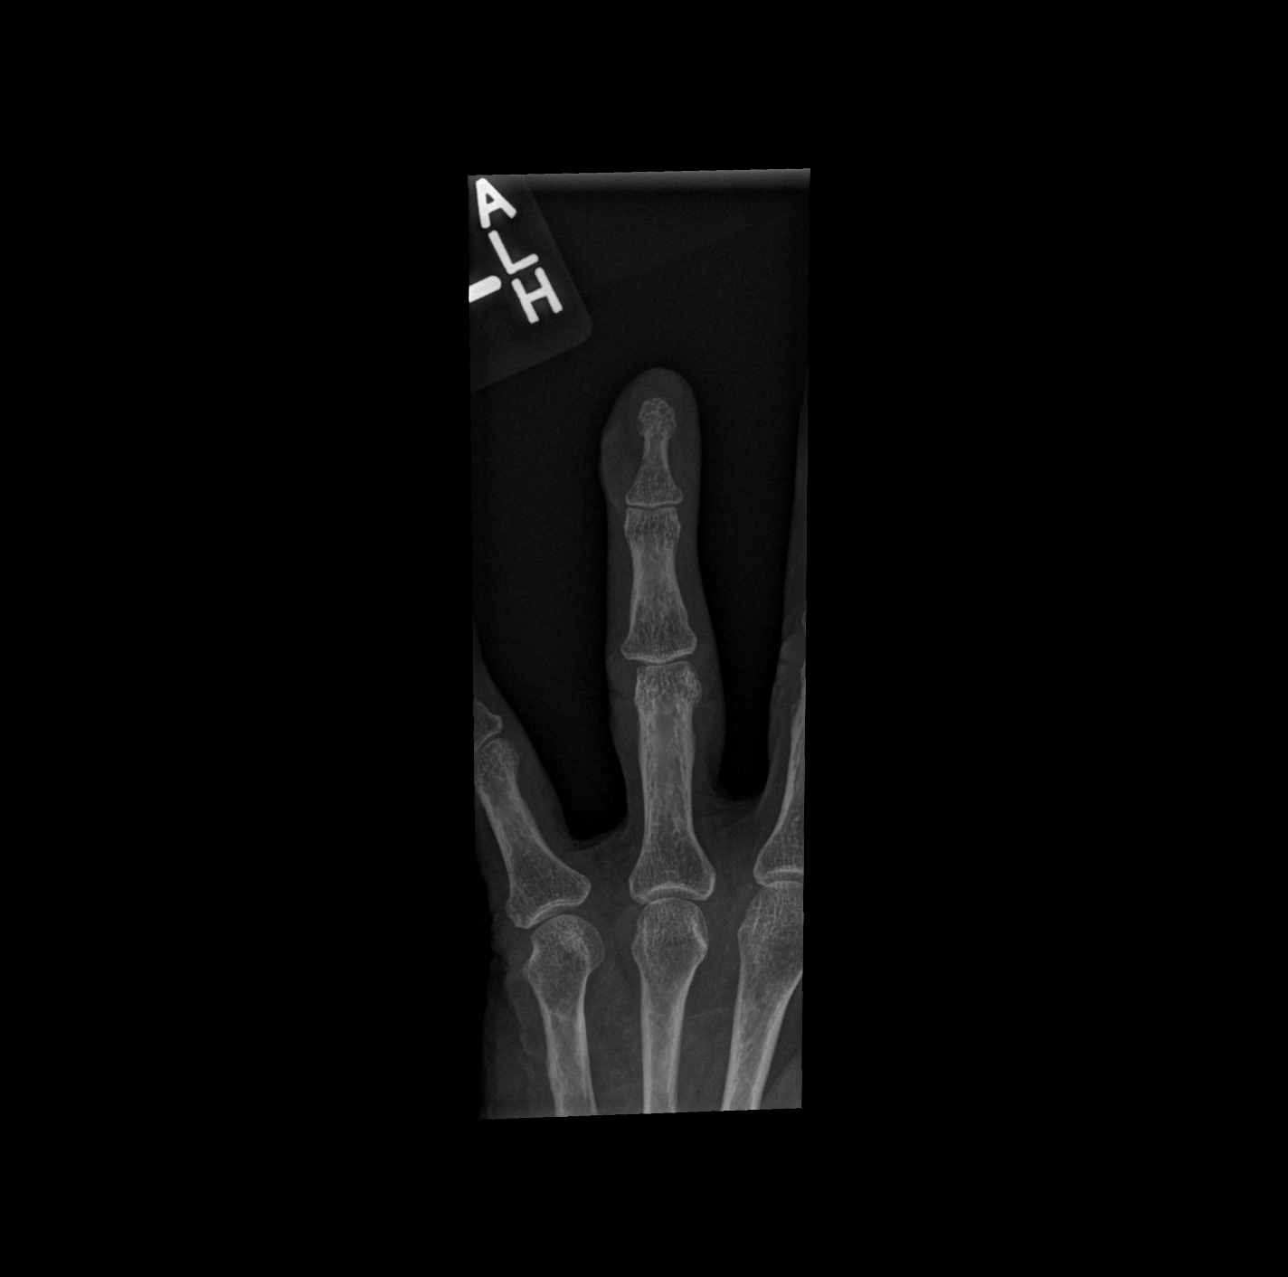

[x finger obl left]
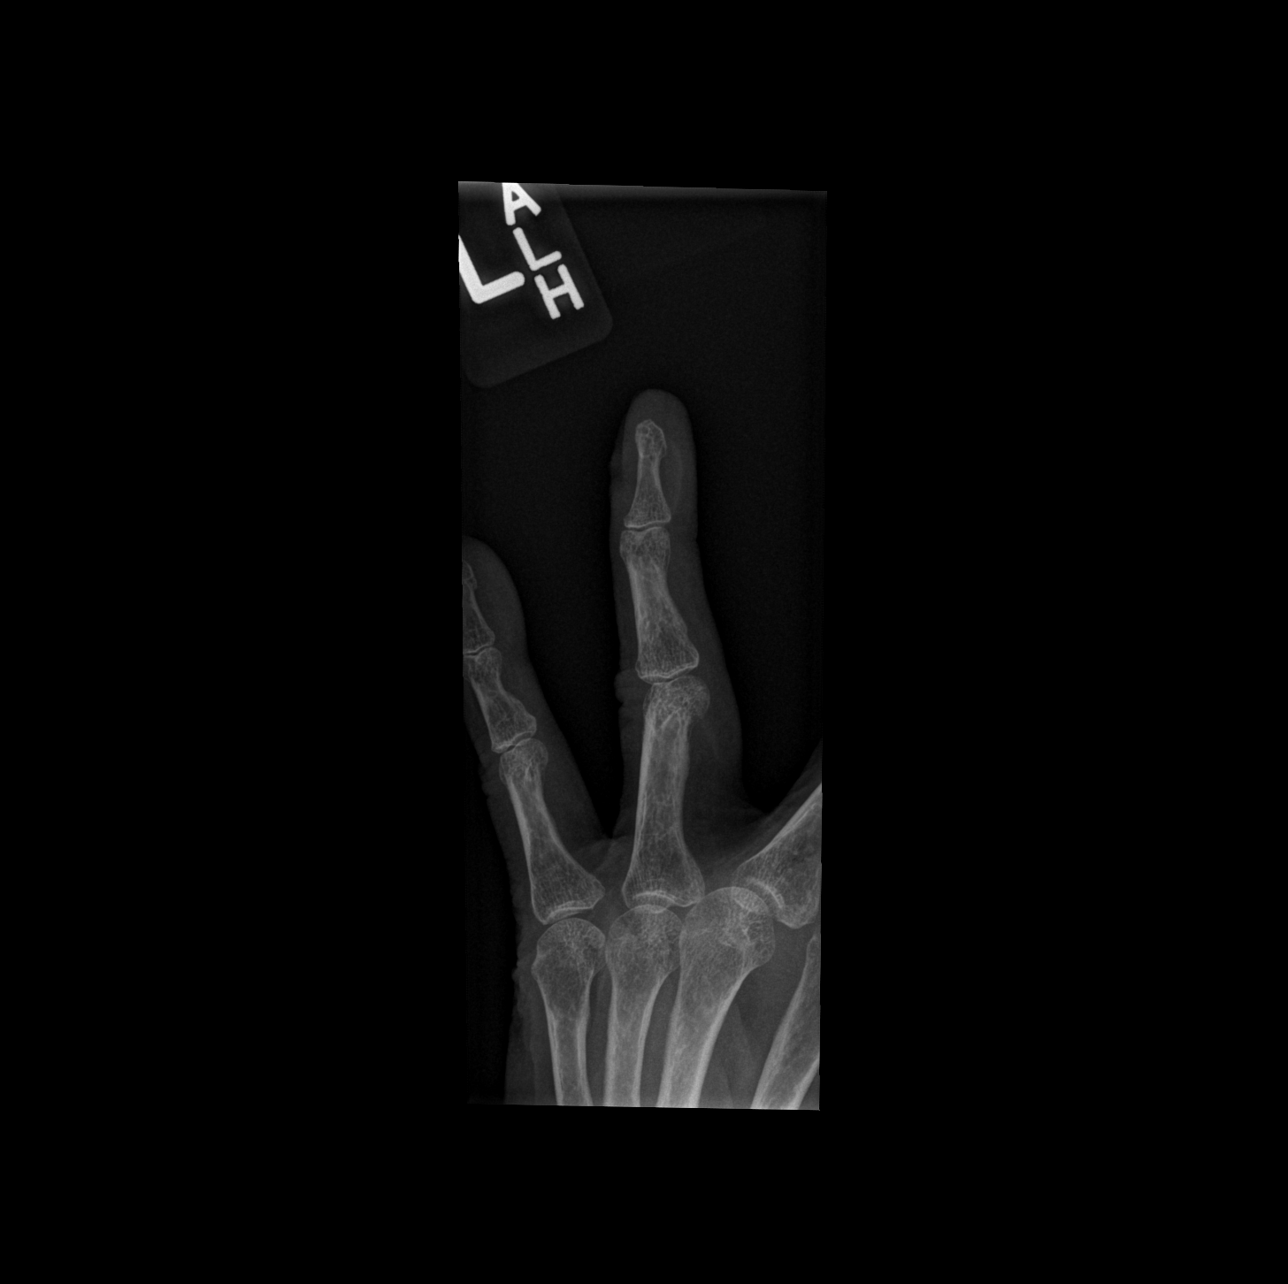

[x finger lat left]
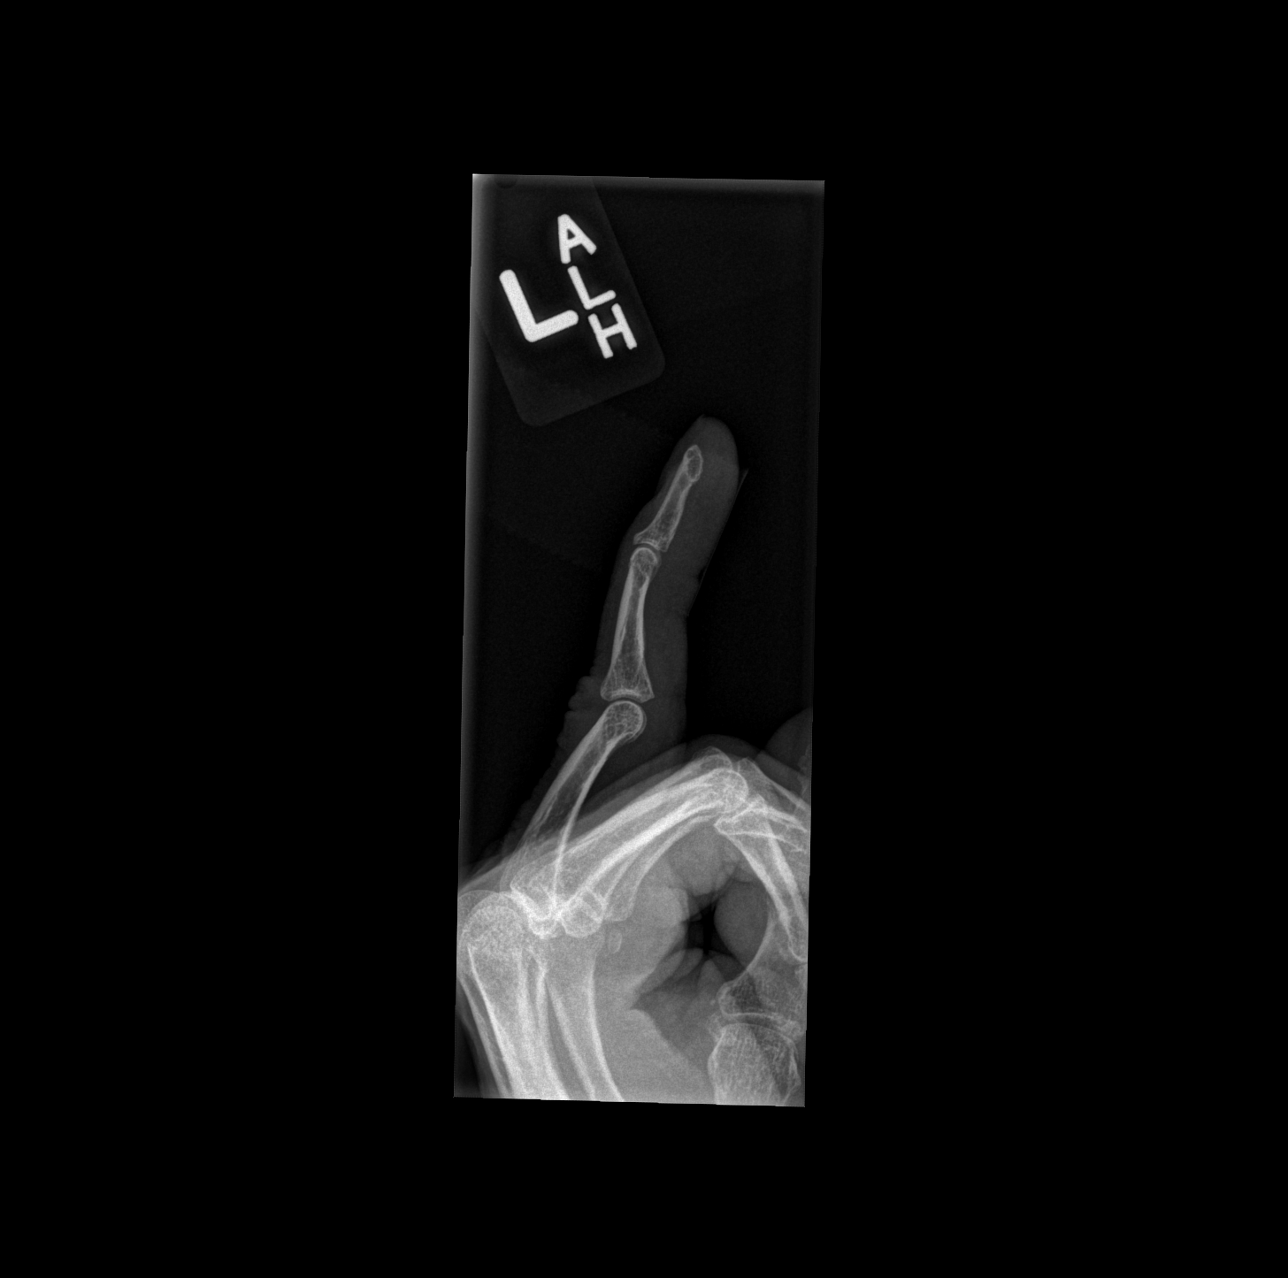

[3 of 3 positions shown; findings below may reference images not displayed]

FINDINGS: Soft tissue swelling over the distal phalanx of the left ring
finger. No underlying bony abnormality. No fracture, subluxation or
dislocation.
IMPRESSION: No acute bony abnormality.

## 2018-01-09 ENCOUNTER — Telehealth: Payer: Self-pay | Admitting: Internal Medicine

## 2018-01-09 NOTE — Telephone Encounter (Signed)
Copied from [place] [id number]. Topic: Quick Communication - Rx Refill/Question >> Jan 09, 2018  3:37 PM Oliver Pila B wrote: Medication:  metFORMIN (GLUCOPHAGE) 500 MG tablet [[id number]]  Pt called about this medication b/c pt feels the quantity is wrong, pt states she should get a 90 day supply which would amount to 270 tablets b/c pt takes the Rx 3x's a day, contact pt to advise

## 2018-01-10 ENCOUNTER — Other Ambulatory Visit: Payer: Self-pay

## 2018-01-10 ENCOUNTER — Other Ambulatory Visit: Payer: Self-pay | Admitting: *Deleted

## 2018-01-10 MED ORDER — CLOPIDOGREL BISULFATE 75 MG PO TABS: 75.0000 mg | ORAL_TABLET | Freq: Every day | ORAL | 2 refills | Status: DC

## 2018-01-10 MED ORDER — METFORMIN HCL 500 MG PO TABS: ORAL_TABLET | ORAL | 0 refills | Status: DC

## 2018-01-10 NOTE — Telephone Encounter (Signed)
Reviewed chart- Rx was for 4 month supply- patient to follow up in 4 months- she has appointment scheduled beginning of April. Refill granted per protocol.Pharmacy changed- patient will call back if she has any problems.

## 2018-02-05 NOTE — Progress Notes (Signed)
Chief Complaint  Patient presents with  . Follow-up    no new concerns, questions about her blood thinners    HPI: Sharon Wilson 77 y.o. come in for Chronic disease management    Dm  bg 121  Up and down.   No lows  No vision or sores or ulcers noted   BP   This am  135  ... Checking once a day.  Generally about 120 - 130  And 60    Goes up in medical setting   LIPIDS  Pravastatin  Couldn't tolerate  Now on crestor.    Trying to ramp up   Hx cva   No new sx.   Unable to complete the   Stress tests cause"legs give oput" describes as aching     w exercise test.  Right is worse .  ( knee issue) though could be fromt the statins   But not sure   Legs are limiting    Hard to do treadmill.    Ever since  Event and meds and cath  Feels more  Cold outside   Nose bleeds  Last months    sonders if related to the blood thinner  .  Otherwise ok     ROS: See pertinent positives and negatives per HPI. No cp sob   Past Medical History:  Diagnosis Date  . Abnormal echocardiogram    a.07/2016 Echo: EF 50-55%, basal and mid inferolateral AK.  . Agatston coronary artery calcium score greater than 400    a. 07/2016 Abnl Echo with basal and mid inferolateral AK, EF 50-55%; b. 09/01/2016 Ex MV: EF 46%, hypertensive response, no ST changes, apical thinning with very mild apical ischemia;  c. 09/21/2016 Cardiac CTA: Ca2+ score = 717, 90th percentile. Extensive CAD w/ mod stenosis in distal LM/ostial LAD, 50% RPLV-->rec Cath.  . Anemia    hysterectomy  . CAD (coronary artery disease)    a. 09/01/2016 Ex MV: EF 46%, hypertensive response, no ST changes, apical thinning with very mild apical ischemia;  c. 09/21/2016 Cardiac CTA: Ca2+ score = 717, 90th percentile. Extensive CAD w/ mod stenosis in distal LM/ostial LAD, 50% RPLV-->rec Cath  . 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 Diverticulosis   . Hyperlipidemia   . OA  (osteoarthritis)   . Stroke (cerebrum) ([place])    a. 07/2016 L facial droop-->Head CT: small area of low attenuation in the right frontoparietal region c/w acute or subacute lacunar infarct; 07/2016 MRI/A brain: acute infarct of right centrum semiovale and possibly posterior limb of right internal capsule. Mild to moderate multifocal narrowing of the MCA arteries w/o occlusion.  . Type II diabetes mellitus ([place])    a. 07/2016 A1c 7.3.  . White coat hypertension     Family History  Problem Relation Age of Onset  . Cancer Mother        breast  . Breast cancer Mother   . CAD Neg Hx     Social History   Socioeconomic History  . Marital status: Married    Spouse name: Not on file  . Number of children: Not on file  . Years of education: Not on file  . Highest education level: Not on file  Occupational History  . Not on file  Social Needs  . Financial resource strain: Not on file  . Food insecurity:    Worry: Not on file  Inability: Not on file  . Transportation needs:    Medical: Not on file    Non-medical: Not on file  Tobacco Use  . Smoking status: Never Smoker  . Smokeless tobacco: Never Used  Substance and Sexual Activity  . Alcohol use: No    Alcohol/week: 0.0 oz  . Drug use: No  . Sexual activity: Not on file  Lifestyle  . Physical activity:    Days per week: Not on file    Minutes per session: Not on file  . Stress: Not on file  Relationships  . Social connections:    Talks on phone: Not on file    Gets together: Not on file    Attends religious service: Not on file    Active member of club or organization: Not on file    Attends meetings of clubs or organizations: Not on file    Relationship status: Not on file  Other Topics Concern  . Not on file  Social History Narrative   Lives in [place] with husband.   Retired.   hh of 2   No pets    Outpatient Medications Prior to Visit  Medication Sig Dispense Refill  . acetaminophen (TYLENOL) 325 MG tablet Take 2  tablets (650 mg total) by mouth every 4 (four) hours as needed for mild pain or moderate pain.    Marland Kitchen aspirin EC 81 MG tablet Take 1 tablet (81 mg total) by mouth daily.    . Calcium Carbonate-Vitamin D (OSCAL 500/200 D-3 PO) Take 1 tablet by mouth 2 (two) times daily.     . carvedilol (COREG) 12.5 MG tablet TAKE 1 & 1/2 (ONE & ONE-HALF) TABLETS BY MOUTH TWICE DAILY WITH A MEAL 270 tablet 2  . clopidogrel (PLAVIX) 75 MG tablet Take 1 tablet (75 mg total) by mouth daily. 90 tablet 2  . isosorbide mononitrate (IMDUR) 30 MG 24 hr tablet Take 0.5 tablets (15 mg total) by mouth daily. 15 tablet 11  . metFORMIN (GLUCOPHAGE) 500 MG tablet TAKE ONE TABLET BY MOUTH IN THE MORNING AND TWO TABS IN THE EVENING 360 tablet 0  . metFORMIN (GLUCOPHAGE) 500 MG tablet TAKE ONE TABLET BY MOUTH IN THE MORNING AND TWO TABS IN THE EVENING 360 tablet 0  . nitroGLYCERIN (NITROSTAT) 0.4 MG SL tablet Place 1 tablet (0.4 mg total) under the tongue every 5 (five) minutes x 3 doses as needed for chest pain. 25 tablet 12  . rosuvastatin (CRESTOR) 5 MG tablet Take 1 tablet by mouth daily as tolerated. 30 tablet 11   No facility-administered medications prior to visit.      EXAM:  BP (!) 188/78 (BP Location: Left Arm, Patient Position: Sitting, Cuff Size: Normal)   Pulse 70   Temp 97.6 F (36.4 C) (Oral)   Wt 127 lb 9.6 oz (57.9 kg)   BMI 23.34 kg/m   Body mass index is 23.34 kg/m.  GENERAL: vitals reviewed and listed above, alert, oriented, appears well hydrated and in no acute distress HEENT: atraumatic, conjunctiva  clear, no obvious abnormalities on inspection of external nose and ears  NECK: no obvious masses on inspection palpation   Thyrouid/ no nodules  LUNGS: clear to auscultation bilaterally, no wheezes, rales or rhonchi, good air movement CV: HRRR, no clubbing cyanosis or  peripheral edema nl cap refill   Le pulses present but dec compared to UE   No asymmetric atrophy?  Nl gait MS: moves all extremities  without noticeable focal  abnormality PSYCH: pleasant  and cooperative,  Mildly anxious baseline  Lab Results  Component Value Date   WBC 5.6 02/06/2018   HGB 12.8 02/06/2018   HCT 37.5 02/06/2018   PLT 206.0 02/06/2018   GLUCOSE 134 (H) 02/06/2018   CHOL 132 12/14/2016   TRIG 92 12/14/2016   HDL 43 12/14/2016   LDLDIRECT 136.0 05/05/2016   LDLCALC 71 12/14/2016   ALT 8 08/15/2017   AST 13 08/15/2017   NA 139 02/06/2018   K 4.6 02/06/2018   CL 100 02/06/2018   CREATININE 0.81 02/06/2018   BUN 12 02/06/2018   CO2 32 02/06/2018   TSH 3.11 02/06/2018   INR 1.0 09/22/2016   HGBA1C 7.5 (H) 02/06/2018   MICROALBUR 4.3 (H) 10/06/2016   BP Readings from Last 3 Encounters:  02/06/18 (!) 188/78  09/19/17 (!) 154/78  08/28/17 (!) 182/90    ASSESSMENT AND PLAN:  Discussed the following assessment and plan:  Diabetes mellitus without complication ([place]) - Plan: CBC with Differential/Platelet, TSH, Basic metabolic panel, Hemoglobin A1c, T4, free, VAS [place] LOWER EXTREMITY ARTERIAL DUPLEX  Medication management - Plan: CBC with Differential/Platelet, TSH, Basic metabolic panel, Hemoglobin A1c, T4, free  Pure hypercholesterolemia - Plan: CBC with Differential/Platelet, TSH, Basic metabolic panel, Hemoglobin A1c, T4, free  Vascular disease - Plan: CBC with Differential/Platelet, TSH, Basic metabolic panel, Hemoglobin A1c, T4, free, VAS [place] LOWER EXTREMITY ARTERIAL DUPLEX  Essential hypertension - Plan: CBC with Differential/Platelet, TSH, Basic metabolic panel, Hemoglobin A1c, T4, free  Statin intolerance - Plan: CBC with Differential/Platelet, TSH, Basic metabolic panel, Hemoglobin A1c, T4, free  Cold intolerance - Plan: CBC with Differential/Platelet, TSH, Basic metabolic panel, Hemoglobin A1c, T4, free  Pain in both lower extremities - Plan: CBC with Differential/Platelet, TSH, Basic metabolic panel, Hemoglobin A1c, T4, free, VAS [place] LOWER EXTREMITY ARTERIAL DUPLEX consideration of     Vascular claudication poss  LE sx in addition to other as not able to complete  stress tests   Would not nec   Be from statin medication .   Disc   ?s risk benefit of any testing or   Med  interventions  To help her anxiety and stress,.  -Patient advised to return or notify health care team  if  new concerns arise. Blood monitoring Patient Instructions  Will notify you  of labs when available. Will  Get   Lower extremity   Abi  Ultrasound to check circulation    In legs  As discussed .   Continue monitoring bpo to make sure at goal when  Not in a medical office.   Plan fu depending on labs and  Artery circulation test.   OR 4 months        Standley Brooking. Evon Dejarnett M.D.

## 2018-02-06 ENCOUNTER — Telehealth: Payer: Self-pay

## 2018-02-06 ENCOUNTER — Ambulatory Visit: Payer: Medicare Other | Admitting: Internal Medicine

## 2018-02-06 ENCOUNTER — Encounter: Payer: Self-pay | Admitting: Internal Medicine

## 2018-02-06 VITALS — BP 188/78 | HR 70 | Temp 97.6°F | Wt 127.6 lb

## 2018-02-06 DIAGNOSIS — M79605 Pain in left leg: Secondary | ICD-10-CM | POA: Diagnosis not present

## 2018-02-06 DIAGNOSIS — E7800 Pure hypercholesterolemia, unspecified: Secondary | ICD-10-CM | POA: Diagnosis not present

## 2018-02-06 DIAGNOSIS — E119 Type 2 diabetes mellitus without complications: Secondary | ICD-10-CM

## 2018-02-06 DIAGNOSIS — I10 Essential (primary) hypertension: Secondary | ICD-10-CM

## 2018-02-06 DIAGNOSIS — R6889 Other general symptoms and signs: Secondary | ICD-10-CM

## 2018-02-06 DIAGNOSIS — Z789 Other specified health status: Secondary | ICD-10-CM

## 2018-02-06 DIAGNOSIS — I999 Unspecified disorder of circulatory system: Secondary | ICD-10-CM

## 2018-02-06 DIAGNOSIS — M79604 Pain in right leg: Secondary | ICD-10-CM

## 2018-02-06 DIAGNOSIS — Z79899 Other long term (current) drug therapy: Secondary | ICD-10-CM

## 2018-02-06 LAB — CBC WITH DIFFERENTIAL/PLATELET
BASOS ABS: 0 10*3/uL (ref 0.0–0.1)
Basophils Relative: 0.7 % (ref 0.0–3.0)
EOS ABS: 0.8 10*3/uL — AB (ref 0.0–0.7)
Eosinophils Relative: 13.7 % — ABNORMAL HIGH (ref 0.0–5.0)
HCT: 37.5 % (ref 36.0–46.0)
Hemoglobin: 12.8 g/dL (ref 12.0–15.0)
Lymphocytes Relative: 12.6 % (ref 12.0–46.0)
Lymphs Abs: 0.7 10*3/uL (ref 0.7–4.0)
MCHC: 34.2 g/dL (ref 30.0–36.0)
MCV: 88.7 fl (ref 78.0–100.0)
MONO ABS: 0.5 10*3/uL (ref 0.1–1.0)
Monocytes Relative: 8.4 % (ref 3.0–12.0)
NEUTROS ABS: 3.6 10*3/uL (ref 1.4–7.7)
Neutrophils Relative %: 64.6 % (ref 43.0–77.0)
PLATELETS: 206 10*3/uL (ref 150.0–400.0)
RBC: 4.22 Mil/uL (ref 3.87–5.11)
RDW: 12.9 % (ref 11.5–15.5)
WBC: 5.6 10*3/uL (ref 4.0–10.5)

## 2018-02-06 LAB — BASIC METABOLIC PANEL
BUN: 12 mg/dL (ref 6–23)
CALCIUM: 9.7 mg/dL (ref 8.4–10.5)
CO2: 32 mEq/L (ref 19–32)
Chloride: 100 mEq/L (ref 96–112)
Creatinine, Ser: 0.81 mg/dL (ref 0.40–1.20)
GFR: 72.72 mL/min (ref >60.00)
Glucose, Bld: 134 mg/dL — ABNORMAL HIGH (ref 70–99)
POTASSIUM: 4.6 meq/L (ref 3.5–5.1)
SODIUM: 139 meq/L (ref 135–145)

## 2018-02-06 LAB — TSH: TSH: 3.11 u[IU]/mL (ref 0.35–4.50)

## 2018-02-06 LAB — T4, FREE: FREE T4: 1 ng/dL (ref 0.60–1.60)

## 2018-02-06 LAB — HEMOGLOBIN A1C: HEMOGLOBIN A1C: 7.5 % — AB (ref 4.6–6.5)

## 2018-02-06 NOTE — Telephone Encounter (Signed)
Copied from [place] [id number]. Topic: General - Other >> Feb 06, 2018 11:32 AM Yvette Rack wrote: Reason for CRM: Rip Harbour from [facility] [phone] Imaging calling because they have an order for pt to have her lower extremity arterial duplex and they need an order for an ABI also

## 2018-02-06 NOTE — Patient Instructions (Addendum)
Will notify you  of labs when available. Will  Get   Lower extremity   Abi  Ultrasound to check circulation    In legs  As discussed .   Continue monitoring bpo to make sure at goal when  Not in a medical office.   Plan fu depending on labs and  Artery circulation test.   OR 4 months

## 2018-02-06 NOTE — Telephone Encounter (Signed)
Order placed per Dr Regis Bill for [name] and made aware.  Pt is going to be called now and will hopefully be able to be scheduled tomorrow 02/07/18. Nothing further needed.   Will send to Dr Regis Bill as Juluis Rainier

## 2018-02-06 NOTE — Telephone Encounter (Signed)
Spoke with Rip Harbour at [facility] -- states that their location does not do arterial duplexes without abn ABI Unable to do the lower extremity arterial duplex without positive ABIs (Ankle brachial Index) If ABIs come back normal then the duplex will not be performed.  Are you okay with ordering the ABI test? Image code - [id number] Not scheduled as of yet, waiting on orders. Imaging states that if orders are placed ASAP then they may be able to get her scheduled for tomorrow but if not put in soon her appt amy be delayed to late this month.   Please advise Dr Regis Bill, thanks.

## 2018-02-09 ENCOUNTER — Ambulatory Visit (HOSPITAL_COMMUNITY)
Admission: RE | Admit: 2018-02-09 | Discharge: 2018-02-09 | Disposition: A | Discharge status: Home / Self Care | Payer: Medicare Other | Source: Ambulatory Visit | Attending: Vascular Surgery | Admitting: Vascular Surgery

## 2018-02-09 ENCOUNTER — Ambulatory Visit (INDEPENDENT_AMBULATORY_CARE_PROVIDER_SITE_OTHER)
Admission: RE | Admit: 2018-02-09 | Discharge: 2018-02-09 | Disposition: A | Discharge status: Home / Self Care | Payer: Medicare Other | Source: Ambulatory Visit | Attending: Vascular Surgery | Admitting: Vascular Surgery

## 2018-02-09 DIAGNOSIS — E119 Type 2 diabetes mellitus without complications: Secondary | ICD-10-CM

## 2018-02-09 DIAGNOSIS — E1151 Type 2 diabetes mellitus with diabetic peripheral angiopathy without gangrene: Secondary | ICD-10-CM | POA: Insufficient documentation

## 2018-02-09 DIAGNOSIS — E785 Hyperlipidemia, unspecified: Secondary | ICD-10-CM | POA: Insufficient documentation

## 2018-02-09 DIAGNOSIS — M79605 Pain in left leg: Secondary | ICD-10-CM | POA: Diagnosis not present

## 2018-02-09 DIAGNOSIS — Z8673 Personal history of transient ischemic attack (TIA), and cerebral infarction without residual deficits: Secondary | ICD-10-CM | POA: Diagnosis not present

## 2018-02-09 DIAGNOSIS — M79604 Pain in right leg: Secondary | ICD-10-CM | POA: Diagnosis not present

## 2018-02-09 DIAGNOSIS — I10 Essential (primary) hypertension: Secondary | ICD-10-CM | POA: Insufficient documentation

## 2018-02-09 DIAGNOSIS — I999 Unspecified disorder of circulatory system: Secondary | ICD-10-CM | POA: Diagnosis not present

## 2018-02-09 DIAGNOSIS — I2510 Atherosclerotic heart disease of native coronary artery without angina pectoris: Secondary | ICD-10-CM | POA: Insufficient documentation

## 2018-02-13 ENCOUNTER — Encounter: Payer: Self-pay | Admitting: Internal Medicine

## 2018-02-13 DIAGNOSIS — I739 Peripheral vascular disease, unspecified: Secondary | ICD-10-CM

## 2018-02-13 DIAGNOSIS — M79606 Pain in leg, unspecified: Secondary | ICD-10-CM

## 2018-02-13 NOTE — Telephone Encounter (Signed)
See result note, already taken care of and spoke with patient. Nothing further needed.

## 2018-02-15 NOTE — Telephone Encounter (Signed)
Order placed for vascular consult  Notes recorded by [name], MD on 02/14/2018 at 1:55 PM EDT Pleas put in referral for [facility] For dx Peripheral artery disease  And leg pain

## 2018-02-15 NOTE — Telephone Encounter (Signed)
Order placed.  Pt notified.  Nothing further needed.  

## 2018-03-21 ENCOUNTER — Other Ambulatory Visit: Payer: Self-pay | Admitting: Internal Medicine

## 2018-03-21 DIAGNOSIS — Z1231 Encounter for screening mammogram for malignant neoplasm of breast: Secondary | ICD-10-CM

## 2018-04-11 ENCOUNTER — Encounter: Payer: Self-pay | Admitting: Vascular Surgery

## 2018-04-11 ENCOUNTER — Ambulatory Visit (INDEPENDENT_AMBULATORY_CARE_PROVIDER_SITE_OTHER): Payer: Medicare Other | Admitting: Vascular Surgery

## 2018-04-11 DIAGNOSIS — I70219 Atherosclerosis of native arteries of extremities with intermittent claudication, unspecified extremity: Secondary | ICD-10-CM | POA: Insufficient documentation

## 2018-04-11 DIAGNOSIS — I70213 Atherosclerosis of native arteries of extremities with intermittent claudication, bilateral legs: Secondary | ICD-10-CM

## 2018-04-11 NOTE — Progress Notes (Signed)
Requested by:  [name], MD [place],  [id number]  Reason for consultation: bilateral leg pain   History of Present Illness   Sharon Wilson is a 77 y.o. (06-02-1941) female who presents with chief complaint: bilateral leg pain.  Patient previously could tolerate 5-10 minutes of exercise on a treadmill.  She notes increasing bilateral muscle aches >6 months ago after increasing her dose of a statin.  She had to be switched to Crestor 5 mg due to the severity of her response to the prior statin.  The patient had cardiac catheterization which suggest significant CAD, hence, the Crestor and double anti-platelet, ASA+Plavix.    Over the last 3-4 months, the patient feels here bilateral leg pain has worsened with short distances.  She sx are c/w intermittent claudication now 3 minutes on the treadmill.  She denies any wounds or gangrene.  Atherosclerotic risk factors include: HLD, DM, HTN.  Pt has known CAD, prior CVA and B carotid stenoses  Past Medical History:  Diagnosis Date  . Abnormal echocardiogram    a.07/2016 Echo: EF 50-55%, basal and mid inferolateral AK.  . Agatston coronary artery calcium score greater than 400    a. 07/2016 Abnl Echo with basal and mid inferolateral AK, EF 50-55%; b. 09/01/2016 Ex MV: EF 46%, hypertensive response, no ST changes, apical thinning with very mild apical ischemia;  c. 09/21/2016 Cardiac CTA: Ca2+ score = 717, 90th percentile. Extensive CAD w/ mod stenosis in distal LM/ostial LAD, 50% RPLV-->rec Cath.  . Anemia    hysterectomy  . CAD (coronary artery disease)    a. 09/01/2016 Ex MV: EF 46%, hypertensive response, no ST changes, apical thinning with very mild apical ischemia;  c. 09/21/2016 Cardiac CTA: Ca2+ score = 717, 90th percentile. Extensive CAD w/ mod stenosis in distal LM/ostial LAD, 50% RPLV-->rec Cath  . 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  Diverticulosis   . Hyperlipidemia   . OA (osteoarthritis)   . Stroke (cerebrum) ([place])    a. 07/2016 L facial droop-->Head CT: small area of low attenuation in the right frontoparietal region c/w acute or subacute lacunar infarct; 07/2016 MRI/A brain: acute infarct of right centrum semiovale and possibly posterior limb of right internal capsule. Mild to moderate multifocal narrowing of the MCA arteries w/o occlusion.  . Type II diabetes mellitus ([place])    a. 07/2016 A1c 7.3.  . White coat hypertension     Past Surgical History:  Procedure Laterality Date  . ABDOMINAL HYSTERECTOMY     fibroid bleeding  . CARDIAC CATHETERIZATION N/A 09/23/2016   Procedure: Left Heart Cath and Coronary Angiography;  Surgeon: Nelva Bush, MD;  Location: [facility];  Service: Cardiovascular;  Laterality: N/A;  . CARDIAC CATHETERIZATION N/A 09/23/2016   Procedure: Intravascular Pressure Wire/FFR Study;  Surgeon: Nelva Bush, MD;  Location: [facility];  Service: Cardiovascular;  Laterality: N/A;  . carotid dopplers  11/11   per [facility]  . CATARACT EXTRACTION  2011    Social History   Socioeconomic History  . Marital status: Married    Spouse name: Not on file  . Number of children: Not on file  . Years of education: Not on file  . Highest education level: Not on file  Occupational History  . Not on file  Social Needs  . Financial resource strain: Not on file  . Food insecurity:  Worry: Not on file    Inability: Not on file  . Transportation needs:    Medical: Not on file    Non-medical: Not on file  Tobacco Use  . Smoking status: Never Smoker  . Smokeless tobacco: Never Used  Substance and Sexual Activity  . Alcohol use: No    Alcohol/week: 0.0 oz  . Drug use: No  . Sexual activity: Not on file  Lifestyle  . Physical activity:    Days per week: Not on file    Minutes per session: Not on file  . Stress: Not on file  Relationships  . Social connections:    Talks  on phone: Not on file    Gets together: Not on file    Attends religious service: Not on file    Active member of club or organization: Not on file    Attends meetings of clubs or organizations: Not on file    Relationship status: Not on file  . Intimate partner violence:    Fear of current or ex partner: Not on file    Emotionally abused: Not on file    Physically abused: Not on file    Forced sexual activity: Not on file  Other Topics Concern  . Not on file  Social History Narrative   Lives in [place] with husband.   Retired.   hh of 2   No pets    Family History  Problem Relation Age of Onset  . Cancer Mother        breast  . Breast cancer Mother   . CAD Neg Hx     Current Outpatient Medications  Medication Sig Dispense Refill  . acetaminophen (TYLENOL) 325 MG tablet Take 2 tablets (650 mg total) by mouth every 4 (four) hours as needed for mild pain or moderate pain.    Marland Kitchen aspirin EC 81 MG tablet Take 1 tablet (81 mg total) by mouth daily.    . Calcium Carbonate-Vitamin D (OSCAL 500/200 D-3 PO) Take 1 tablet by mouth 2 (two) times daily.     . carvedilol (COREG) 12.5 MG tablet TAKE 1 & 1/2 (ONE & ONE-HALF) TABLETS BY MOUTH TWICE DAILY WITH A MEAL 270 tablet 2  . clopidogrel (PLAVIX) 75 MG tablet Take 1 tablet (75 mg total) by mouth daily. 90 tablet 2  . isosorbide mononitrate (IMDUR) 30 MG 24 hr tablet Take 0.5 tablets (15 mg total) by mouth daily. 15 tablet 11  . metFORMIN (GLUCOPHAGE) 500 MG tablet TAKE ONE TABLET BY MOUTH IN THE MORNING AND TWO TABS IN THE EVENING 360 tablet 0  . nitroGLYCERIN (NITROSTAT) 0.4 MG SL tablet Place 1 tablet (0.4 mg total) under the tongue every 5 (five) minutes x 3 doses as needed for chest pain. 25 tablet 12  . rosuvastatin (CRESTOR) 5 MG tablet Take 1 tablet by mouth daily as tolerated. 30 tablet 11   No current facility-administered medications for this visit.     Allergies  Allergen Reactions  . Ace Inhibitors Hives, Swelling and Cough    . Lovastatin Other (See Comments)    Leg cramps,  NO statins  . Norvasc [Amlodipine Besylate]     Light headed and vertigo like  Also may have had hives.     REVIEW OF SYSTEMS (negative unless checked):   Cardiac:  []  Chest pain or chest pressure? []  Shortness of breath upon activity? []  Shortness of breath when lying flat? []  Irregular heart rhythm?  Vascular:  [x]  Pain in calf,  thigh, or hip brought on by walking? []  Pain in feet at night that wakes you up from your sleep? []  Blood clot in your veins? []  Leg swelling?  Pulmonary:  []  Oxygen at home? []  Productive cough? []  Wheezing?  Neurologic:  []  Sudden weakness in arms or legs? []  Sudden numbness in arms or legs? []  Sudden onset of difficult speaking or slurred speech? []  Temporary loss of vision in one eye? []  Problems with dizziness?  Gastrointestinal:  []  Blood in stool? []  Vomited blood?  Genitourinary:  []  Burning when urinating? []  Blood in urine?  Psychiatric:  []  Major depression  Hematologic:  []  Bleeding problems? []  Problems with blood clotting?  Dermatologic:  []  Rashes or ulcers?  Constitutional:  []  Fever or chills?  Ear/Nose/Throat:  []  Change in hearing? []  Nose bleeds? []  Sore throat?  Musculoskeletal:  []  Back pain? []  Joint pain? []  Muscle pain?   For VQI Use Only   PRE-ADM LIVING Home  AMB STATUS Ambulatory  CAD Sx History of MI, but no symptoms No MI within 6 months  PRIOR CHF None  STRESS TEST No   Physical Examination     Vitals:   04/11/18 1253  BP: (!) 179/78  Pulse: 67  Resp: 14  Temp: 97.9 F (36.6 C)  TempSrc: Oral  SpO2: 98%  Weight: 128 lb 12.8 oz (58.4 kg)  Height: 5\' 2"  (1.575 m)   Body mass index is 23.56 kg/m.  General Alert, O x 3, WD, NAD  Head [place]/AT,    Ear/Nose/ Throat Hearing grossly intact, nares without erythema or drainage, oropharynx without Erythema or Exudate, Mallampati score: 3,   Eyes PERRLA, EOMI,    Neck Supple,  mid-line trachea,    Pulmonary Sym exp, good B air movt, CTA B  Cardiac RRR, Nl S1, S2, no Murmurs, No rubs, No S3,S4  Vascular Vessel Right Left  Radial Palpable Palpable  Brachial Palpable Palpable  Carotid Palpable, No Bruit Palpable, No Bruit  Aorta Not palpable N/A  Femoral Palpable Palpable  Popliteal Not palpable Not palpable  PT Faintly palpable Faintly palpable  DP Palpable Palpable    Gastro- intestinal soft, non-distended, non-tender to palpation, No guarding or rebound, no HSM, no masses, no CVAT B, No palpable prominent aortic pulse,    Musculo- skeletal M/S 5/5 throughout  , Extremities without ischemic changes  , No edema present, No visible varicosities , No Lipodermatosclerosis present  Neurologic Cranial nerves grossly intact , Pain and light touch intact in extremities , Motor exam as listed above  Psychiatric Judgement intact, Mood & affect appropriate for pt's clinical situation  Dermatologic See M/S exam for extremity exam, No rashes otherwise noted  Lymphatic  Palpable lymph nodes: None    Non-Invasive Vascular imaging   BLE ABI (02/09/18)  R:   ABI: 0.96,   PT: bi  DP: bi  TBI:  0.61  L:   ABI: 0.82,   PT: bi  DP: bi  TBI: 0.55  BLE arterial duplex (02/09/18)  R: Bi-tri throughout with mid-SFA stenosis 30-49%, PTA fed by collaterals: monophasic, biphasic peroneal and AT  L: bi-tri throughout with PFA 30-49% stenosis, biphasic AT with 50-74% stenosis, PT monophasic   Medical Decision Making   CHERYLYNN LISZEWSKI is a 77 y.o. female who presents with: DM with complications, multiple tissue beds with atherosclerotic disease, Non-lifestyle limiting BLE intermittent claudication   Patient does not feel her sx are lifestyle limiting at this point.  Her ABI normally would  not result in intermittent claudication, suggesting there may be some medial calcification.  Based on this patient's history and physical exam, I recommend: maximal medical  mgmt including walking plan  I discussed with the patient the natural history of intermittent claudication: 75% of patients have stable or improved symptoms in a year an only 2% require amputation. Eventually 20% may require intervention in a year.  I discussed in depth with the patient the nature of atherosclerosis, and emphasized the importance of maximal medical management including strict control of blood pressure, blood glucose, and lipid levels, antiplatelet agent, obtaining regular exercise, and cessation of smoking.    The patient is aware that without maximal medical management the underlying atherosclerotic disease process will progress, limiting the benefit of any interventions.  I discussed in depth with the patient a walking plan and how to execute such.  The patient is not interested in starting Pletal.  The patient is currently on a statin: Crestor.   The patient is currently on an anti-platelet: ASA and Plavix.   Will recheck BLE ABI in 6 months.  Thank you for allowing [place] to participate in this patient's care.   Adele Barthel, MD, FACS [facility] Office: [phone] Pager: [phone]  04/11/2018, 1:29 PM

## 2018-04-16 ENCOUNTER — Ambulatory Visit
Admission: RE | Admit: 2018-04-16 | Discharge: 2018-04-16 | Disposition: A | Discharge status: Home / Self Care | Payer: Medicare Other | Source: Ambulatory Visit | Attending: Internal Medicine | Admitting: Internal Medicine

## 2018-04-16 DIAGNOSIS — Z1231 Encounter for screening mammogram for malignant neoplasm of breast: Secondary | ICD-10-CM

## 2018-04-20 ENCOUNTER — Other Ambulatory Visit: Payer: Self-pay

## 2018-04-20 DIAGNOSIS — I70213 Atherosclerosis of native arteries of extremities with intermittent claudication, bilateral legs: Secondary | ICD-10-CM

## 2018-04-27 ENCOUNTER — Encounter: Payer: Self-pay | Admitting: Cardiology

## 2018-04-27 ENCOUNTER — Ambulatory Visit (INDEPENDENT_AMBULATORY_CARE_PROVIDER_SITE_OTHER): Payer: Medicare Other | Admitting: Cardiology

## 2018-04-27 VITALS — BP 138/84 | HR 66 | Ht 62.0 in | Wt 126.2 lb

## 2018-04-27 DIAGNOSIS — I2510 Atherosclerotic heart disease of native coronary artery without angina pectoris: Secondary | ICD-10-CM | POA: Diagnosis not present

## 2018-04-27 DIAGNOSIS — I10 Essential (primary) hypertension: Secondary | ICD-10-CM | POA: Diagnosis not present

## 2018-04-27 DIAGNOSIS — E7800 Pure hypercholesterolemia, unspecified: Secondary | ICD-10-CM | POA: Diagnosis not present

## 2018-04-27 LAB — HEPATIC FUNCTION PANEL
ALK PHOS: 59 IU/L (ref 39–117)
ALT: 8 IU/L (ref 0–32)
AST: 14 IU/L (ref 0–40)
Albumin: 4.5 g/dL (ref 3.5–4.8)
Bilirubin Total: 0.5 mg/dL (ref 0.0–1.2)
Bilirubin, Direct: 0.11 mg/dL (ref 0.00–0.40)
TOTAL PROTEIN: 6.3 g/dL (ref 6.0–8.5)

## 2018-04-27 LAB — LIPID PANEL
CHOLESTEROL TOTAL: 140 mg/dL (ref 100–199)
Chol/HDL Ratio: 3.6 ratio (ref 0.0–4.4)
HDL: 39 mg/dL — AB (ref >39)
LDL Calculated: 76 mg/dL (ref 0–99)
Triglycerides: 127 mg/dL (ref 0–149)
VLDL Cholesterol Cal: 25 mg/dL (ref 5–40)

## 2018-04-27 MED ORDER — ROSUVASTATIN CALCIUM 5 MG PO TABS: ORAL_TABLET | ORAL | 3 refills | Status: DC

## 2018-04-27 NOTE — Progress Notes (Signed)
Cardiology Office Note:    Date:  04/27/2018   ID:  Sharon Wilson, DOB Apr 21, 1941, MRN [id number]  PCP:  [name], MD  Cardiologist:  No primary care provider on file.    Referring MD: [name], MD   Chief Complaint  Patient presents with  . Coronary Artery Disease  . Hypertension  . Hyperlipidemia    History of Present Illness:    Sharon Wilson is a 77 y.o. female with a hx of R brain CVA in 9/17, DM, HL, carotid artery disease and white coat HTN. Echo after her CVA demonstrated normal LVF butinferolateral AK prompting an ischemic workup. Nuclear stress test demonstrated apical thinning and very mild apical ischemia. Coronary CTA revealed a coronary calcium score of 717, placing her in the 90th percentile. She also had suggestion of distal left main/ostial LAD plaquing of less than 50% and an area in the RP LV that was approximately 50% as well. She underwent cath showing severe 3 vessel ASCAD.  She was seen by Dr. Servando Snare on 10/03/16 and giventhe patient's relatively recent stroke, lack of symptoms and negative stress test, he has opted to hold off on surgery, as her risk of recurrent stroke would be significantly greater. She was seen again in f/u by Dr. Servando Snare on 10/27/16 and overall consensus wasto continue to treat medically, for now, given her lack of symptoms.   She is here today for followup and is doing well.  She denies any chest pain or pressure, SOB, DOE, PND, orthopnea, LE edema, dizziness, palpitations or syncope. She is compliant with her meds and is tolerating meds with no SE.    Past Medical History:  Diagnosis Date  . Abnormal echocardiogram    a.07/2016 Echo: EF 50-55%, basal and mid inferolateral AK.  . Agatston coronary artery calcium score greater than 400    a. 07/2016 Abnl Echo with basal and mid inferolateral AK, EF 50-55%; b. 09/01/2016 Ex MV: EF 46%, hypertensive response, no ST changes, apical thinning with very mild apical  ischemia;  c. 09/21/2016 Cardiac CTA: Ca2+ score = 717, 90th percentile. Extensive CAD w/ mod stenosis in distal LM/ostial LAD, 50% RPLV-->rec Cath.  . Anemia    hysterectomy  . CAD (coronary artery disease)    a. 09/01/2016 Ex MV: EF 46%, hypertensive response, no ST changes, apical thinning with very mild apical ischemia;  c. 09/21/2016 Cardiac CTA: Ca2+ score = 717, 90th percentile. Extensive CAD w/ mod stenosis in distal LM/ostial LAD, 50% RPLV-->rec Cath  . 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 Diverticulosis   . Hyperlipidemia   . OA (osteoarthritis)   . Stroke (cerebrum) ([place])    a. 07/2016 L facial droop-->Head CT: small area of low attenuation in the right frontoparietal region c/w acute or subacute lacunar infarct; 07/2016 MRI/A brain: acute infarct of right centrum semiovale and possibly posterior limb of right internal capsule. Mild to moderate multifocal narrowing of the MCA arteries w/o occlusion.  . Type II diabetes mellitus ([place])    a. 07/2016 A1c 7.3.  . White coat hypertension     Past Surgical History:  Procedure Laterality Date  . ABDOMINAL HYSTERECTOMY     fibroid bleeding  . CARDIAC CATHETERIZATION N/A 09/23/2016   Procedure: Left Heart Cath and Coronary Angiography;  Surgeon: Nelva Bush, MD;  Location: [facility];  Service: Cardiovascular;  Laterality: N/A;  . CARDIAC CATHETERIZATION N/A 09/23/2016  Procedure: Intravascular Pressure Wire/FFR Study;  Surgeon: Nelva Bush, MD;  Location: [facility];  Service: Cardiovascular;  Laterality: N/A;  . carotid dopplers  11/11   per [facility] clinic  . CATARACT EXTRACTION  2011    Current Medications: Current Meds  Medication Sig  . acetaminophen (TYLENOL) 325 MG tablet Take 2 tablets (650 mg total) by mouth every 4 (four) hours as needed for mild pain or moderate pain.  Marland Kitchen aspirin EC 81 MG tablet Take 1 tablet (81 mg total) by mouth daily.    . Calcium Carbonate-Vitamin D (OSCAL 500/200 D-3 PO) Take 1 tablet by mouth 2 (two) times daily.   . carvedilol (COREG) 12.5 MG tablet TAKE 1 & 1/2 (ONE & ONE-HALF) TABLETS BY MOUTH TWICE DAILY WITH A MEAL  . clopidogrel (PLAVIX) 75 MG tablet Take 1 tablet (75 mg total) by mouth daily.  . isosorbide mononitrate (IMDUR) 30 MG 24 hr tablet Take 0.5 tablets (15 mg total) by mouth daily.  . metFORMIN (GLUCOPHAGE) 500 MG tablet TAKE ONE TABLET BY MOUTH IN THE MORNING AND TWO TABS IN THE EVENING  . nitroGLYCERIN (NITROSTAT) 0.4 MG SL tablet Place 1 tablet (0.4 mg total) under the tongue every 5 (five) minutes x 3 doses as needed for chest pain.  . rosuvastatin (CRESTOR) 5 MG tablet Take 1 tablet by mouth daily as tolerated.  . [DISCONTINUED] rosuvastatin (CRESTOR) 5 MG tablet Take 1 tablet by mouth daily as tolerated.     Allergies:   Ace inhibitors; Lovastatin; and Norvasc [amlodipine besylate]   Social History   Socioeconomic History  . Marital status: Married    Spouse name: Not on file  . Number of children: Not on file  . Years of education: Not on file  . Highest education level: Not on file  Occupational History  . Not on file  Social Needs  . Financial resource strain: Not on file  . Food insecurity:    Worry: Not on file    Inability: Not on file  . Transportation needs:    Medical: Not on file    Non-medical: Not on file  Tobacco Use  . Smoking status: Never Smoker  . Smokeless tobacco: Never Used  Substance and Sexual Activity  . Alcohol use: No    Alcohol/week: 0.0 oz  . Drug use: No  . Sexual activity: Not on file  Lifestyle  . Physical activity:    Days per week: Not on file    Minutes per session: Not on file  . Stress: Not on file  Relationships  . Social connections:    Talks on phone: Not on file    Gets together: Not on file    Attends religious service: Not on file    Active member of club or organization: Not on file    Attends meetings of clubs or  organizations: Not on file    Relationship status: Not on file  Other Topics Concern  . Not on file  Social History Narrative   Lives in [place] with husband.   Retired.   hh of 2   No pets     Family History: The patient's family history includes Breast cancer in her mother; Cancer in her mother. There is no history of CAD.  ROS:   Please see the history of present illness.    ROS  All other systems reviewed and negative.   EKGs/Labs/Other Studies Reviewed:    The following studies were reviewed today: none  EKG:  EKG is  ordered today.  The ekg ordered today demonstrates normal sinus rhythm with first-degree AV block  Recent Labs: 08/15/2017: ALT 8 02/06/2018: BUN 12; Creatinine, Ser 0.81; Hemoglobin 12.8; Platelets 206.0; Potassium 4.6; Sodium 139; TSH 3.11   Recent Lipid Panel    Component Value Date/Time   CHOL 132 12/14/2016 0754   TRIG 92 12/14/2016 0754   HDL 43 12/14/2016 0754   CHOLHDL 3.1 12/14/2016 0754   CHOLHDL 4 10/06/2016 0826   VLDL 27.0 10/06/2016 0826   LDLCALC 71 12/14/2016 0754   LDLDIRECT 136.0 05/05/2016 0816    Physical Exam:    VS:  BP 138/84   Pulse 66   Ht 5\' 2"  (1.575 m)   Wt 126 lb 3.2 oz (57.2 kg)   BMI 23.08 kg/m     Wt Readings from Last 3 Encounters:  04/27/18 126 lb 3.2 oz (57.2 kg)  04/11/18 128 lb 12.8 oz (58.4 kg)  02/06/18 127 lb 9.6 oz (57.9 kg)     GEN:  Well nourished, well developed in no acute distress HEENT: Normal NECK: No JVD; No carotid bruits LYMPHATICS: No lymphadenopathy CARDIAC: RRR, no murmurs, rubs, gallops RESPIRATORY:  Clear to auscultation without rales, wheezing or rhonchi  ABDOMEN: Soft, non-tender, non-distended MUSCULOSKELETAL:  No edema; No deformity  SKIN: Warm and dry NEUROLOGIC:  Alert and oriented x 3 PSYCHIATRIC:  Normal affect   ASSESSMENT:    1. Coronary artery disease involving native coronary artery of native heart without angina pectoris   2. Essential hypertension   3. Pure  hypercholesterolemia    PLAN:    In order of problems listed above:  1.  ASCAD -severe three-vessel CAD.  She was not felt to be a candidate for CABG after initial cath due to recent CVA.  She was then seen back by CVTS and it was felt that we should hold off on CABG as patient was asymptomatic.  She has not had any anginal symptoms.  She is quite active and her activity level is only limited by cramps she gets in her legs.  She will continue on aspirin 81 mg daily, Plavix 75 mg daily, Imdur 15 mg daily and beta-blocker.  She will also continue on statin therapy.  I have encouraged her to call right away if she starts having any type of chest discomfort or shortness of breath as this may be an indication her CAD is progressing.  2.  HTN -BP is well controlled on exam today.  She will continue on carvedilol 12.5 mill grams twice daily.  3.  Hyperlipidemia -LDL goal is less than 70.  Her LDL was 71 a year ago.  I will repeat an FLP and ALT.  She will continue on Crestor 5 mg daily.   Medication Adjustments/Labs and Tests Ordered: Current medicines are reviewed at length with the patient today.  Concerns regarding medicines are outlined above.  Orders Placed This Encounter  Procedures  . EKG 12-Lead   Meds ordered this encounter  Medications  . rosuvastatin (CRESTOR) 5 MG tablet    Sig: Take 1 tablet by mouth daily as tolerated.    Dispense:  90 tablet    Refill:  3    Signed, Fransico Him, MD  04/27/2018 8:46 AM    [facility]

## 2018-04-27 NOTE — Patient Instructions (Signed)
Medication Instructions:  Your physician recommends that you continue on your current medications as directed. Please refer to the Current Medication list given to you today.  If you need a refill on your cardiac medications, please contact your pharmacy first.  Labwork: Today for fasting lipid panel and liver function test   Testing/Procedures: None ordered   Follow-Up: Your physician wants you to follow-up in: 6 months with Dr. Radford Pax. You will receive a reminder letter in the mail two months in advance. If you don't receive a letter, please call our office to schedule the follow-up appointment.  Any Other Special Instructions Will Be Listed Below (If Applicable).   Thank you for choosing [facility], RN  [phone]  If you need a refill on your cardiac medications before your next appointment, please call your pharmacy.

## 2018-05-04 ENCOUNTER — Telehealth: Payer: Self-pay

## 2018-05-04 DIAGNOSIS — E785 Hyperlipidemia, unspecified: Secondary | ICD-10-CM

## 2018-05-04 NOTE — Telephone Encounter (Signed)
Notes recorded by Teressa Senter, RN on 05/04/2018 at 10:26 AM EDT Pt refused to start Zetia, she states she was intolerant to it, she is willing to increase Crestor to 5 mg daily and is scheduled for repeat labs on 07/11/18. She stated understanding and thankful for the call ------  Notes recorded by Leeroy Bock, RPH on 05/04/2018 at 8:31 AM EDT Would start Zetia 10mg  daily and recheck lipids in 3 months. Looks like pt took this previously and d/c'ed due to cost. Zetia is generic now so price should hopefully not be an issue. ------  Notes recorded by Teressa Senter, RN on 05/04/2018 at 8:20 AM EDT To lipid clinic ------  Notes recorded by Sueanne Margarita, MD on 05/02/2018 at 10:15 PM EDT Please refer to lipid clinic ------  Notes recorded by Teressa Senter, RN on 05/01/2018 at 5:30 PM EDT Pt made aware of lab results. She states she was only taking Crestor 5 mg 2x/week, 5 weeks ago she increased Crestor to 5 mg 3x/week and she has been able to tolerate dosage so far. She states due to muscle aches she was unable to tolerate daily. I informed her that I will forward to Dr. Radford Pax for further recommendations. She stated understanding and thankful for the call

## 2018-05-04 NOTE — Telephone Encounter (Signed)
Please refer patient to lipid clinic

## 2018-05-07 NOTE — Telephone Encounter (Signed)
Would wait for f/u lab results this fall before scheduling in lipid clinic. Increasing Crestor frequency will likely bring LDL to goal.

## 2018-05-14 ENCOUNTER — Other Ambulatory Visit: Payer: Self-pay | Admitting: Internal Medicine

## 2018-05-16 ENCOUNTER — Other Ambulatory Visit: Payer: Self-pay | Admitting: Internal Medicine

## 2018-05-16 LAB — HM DIABETES EYE EXAM

## 2018-05-16 NOTE — Telephone Encounter (Signed)
Sharon Wilson is calling to get a refill or a new prescription for Metformin. They state that the last Rx was sent on 01/2018 it does not have any more refills, she should be out.  Last OV:02/06/18 Last refill:03/31/17 360 tab/0 refill [id number] Pharmacy: [facility] [place], [place] - [address] [phone] (Phone) [phone] (Fax)

## 2018-05-16 NOTE — Telephone Encounter (Signed)
Copied from [place] [id number]. Topic: Quick Communication - See Telephone Encounter >> May 16, 2018  2:08 PM Hewitt Shorts wrote: [facility] to get a refill or a new rx for metformin -they state that the last Rx on 01/2018 it did not have any more refills on it show she should be out   Best number  [phone]

## 2018-05-17 MED ORDER — METFORMIN HCL 500 MG PO TABS: ORAL_TABLET | ORAL | 0 refills | Status: DC

## 2018-06-13 ENCOUNTER — Other Ambulatory Visit: Payer: Self-pay | Admitting: Internal Medicine

## 2018-06-14 ENCOUNTER — Other Ambulatory Visit: Payer: Self-pay | Admitting: Internal Medicine

## 2018-06-14 NOTE — Progress Notes (Signed)
Chief Complaint  Patient presents with  . Follow-up    Recheck labs. No Concern other than still having elevated BPs    HPI: Sharon Wilson 77 y.o. come in for Chronic disease management    DM bg 119   130 or lower .  Nose of meds taking 1500 mg metformin per day   Still bowls   Still cant stay on treatdmill 4 minutes   tolerance   ocass speech issues  When tired no new neuro sx   Balance is ok  But worries .  about  Steps.   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   bp was 130 at home  Today .  Just  on carvedilol  Says usually in the 120 range   On crestor 5 per day   Inc to get lipid check in September per cards  ROS: See pertinent positives and negatives per HPI. Had lef shoulder pain worked out using topicals   Past Medical History:  Diagnosis Date  . Abnormal echocardiogram    a.07/2016 Echo: EF 50-55%, basal and mid inferolateral AK.  . Agatston coronary artery calcium score greater than 400    a. 07/2016 Abnl Echo with basal and mid inferolateral AK, EF 50-55%; b. 09/01/2016 Ex MV: EF 46%, hypertensive response, no ST changes, apical thinning with very mild apical ischemia;  c. 09/21/2016 Cardiac CTA: Ca2+ score = 717, 90th percentile. Extensive CAD w/ mod stenosis in distal LM/ostial LAD, 50% RPLV-->rec Cath.  . Anemia    hysterectomy  . CAD (coronary artery disease)    a. 09/01/2016 Ex MV: EF 46%, hypertensive response, no ST changes, apical thinning with very mild apical ischemia;  c. 09/21/2016 Cardiac CTA: Ca2+ score = 717, 90th percentile. Extensive CAD w/ mod stenosis in distal LM/ostial LAD, 50% RPLV-->rec Cath  . 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 Diverticulosis   . Hyperlipidemia   . OA (osteoarthritis)   . Stroke (cerebrum) ([place])    a. 07/2016 L facial droop-->Head CT: small area of low attenuation in the right frontoparietal region c/w acute or subacute  lacunar infarct; 07/2016 MRI/A brain: acute infarct of right centrum semiovale and possibly posterior limb of right internal capsule. Mild to moderate multifocal narrowing of the MCA arteries w/o occlusion.  . Type II diabetes mellitus ([place])    a. 07/2016 A1c 7.3.  . White coat hypertension     Family History  Problem Relation Age of Onset  . Cancer Mother        breast  . Breast cancer Mother   . CAD Neg Hx     Social History   Socioeconomic History  . Marital status: Married    Spouse name: Not on file  . Number of children: Not on file  . Years of education: Not on file  . Highest education level: Not on file  Occupational History  . Not on file  Social Needs  . Financial resource strain: Not on file  . Food insecurity:    Worry: Not on file    Inability: Not on file  . Transportation needs:    Medical: Not on file    Non-medical: Not on file  Tobacco Use  . Smoking status: Never Smoker  . Smokeless tobacco: Never Used  Substance and Sexual Activity  . Alcohol use: No    Alcohol/week: 0.0 standard  drinks  . Drug use: No  . Sexual activity: Not on file  Lifestyle  . Physical activity:    Days per week: Not on file    Minutes per session: Not on file  . Stress: Not on file  Relationships  . Social connections:    Talks on phone: Not on file    Gets together: Not on file    Attends religious service: Not on file    Active member of club or organization: Not on file    Attends meetings of clubs or organizations: Not on file    Relationship status: Not on file  Other Topics Concern  . Not on file  Social History Narrative   Lives in [place] with husband.   Retired.   hh of 2   No pets    Outpatient Medications Prior to Visit  Medication Sig Dispense Refill  . acetaminophen (TYLENOL) 325 MG tablet Take 2 tablets (650 mg total) by mouth every 4 (four) hours as needed for mild pain or moderate pain.    Marland Kitchen aspirin EC 81 MG tablet Take 1 tablet (81 mg total) by  mouth daily.    . Calcium Carbonate-Vitamin D (OSCAL 500/200 D-3 PO) Take 1 tablet by mouth 2 (two) times daily.     . carvedilol (COREG) 12.5 MG tablet Take 1 and 1/2 tablets by mouth twice daily with meals 270 tablet 0  . clopidogrel (PLAVIX) 75 MG tablet Take 1 tablet (75 mg total) by mouth daily. 90 tablet 2  . isosorbide mononitrate (IMDUR) 30 MG 24 hr tablet Take 0.5 tablets (15 mg total) by mouth daily. 15 tablet 11  . metFORMIN (GLUCOPHAGE) 500 MG tablet TAKE ONE TABLET BY MOUTH IN THE MORNING AND TWO TABS IN THE EVENING 360 tablet 0  . nitroGLYCERIN (NITROSTAT) 0.4 MG SL tablet Place 1 tablet (0.4 mg total) under the tongue every 5 (five) minutes x 3 doses as needed for chest pain. 25 tablet 12  . rosuvastatin (CRESTOR) 5 MG tablet Take 1 tablet by mouth daily as tolerated. (Patient taking differently: Take 5 mg by mouth at bedtime. ) 90 tablet 3   No facility-administered medications prior to visit.      EXAM:  BP (!) 160/60 (BP Location: Right Arm, Patient Position: Sitting)   Pulse 66   Temp 98.1 F (36.7 C) (Oral)   Wt 126 lb (57.2 kg)   BMI 23.05 kg/m   Body mass index is 23.05 kg/m.  GENERAL: vitals reviewed and listed above, alert, oriented, appears well hydrated and in no acute distress HEENT: atraumatic, conjunctiva  clear, no obvious abnormalities on inspection of external nose and ears  NECK: no obvious masses on inspection palpation  LUNGS: clear to auscultation bilaterally, no wheezes, rales or rhonchi, good air movement CV: HRRR, no clubbing cyanosis or  peripheral edema nl cap refill  Feet normal  Perfusion no lesions    Gait seems normal aas well as speech   MS: moves all extremities without noticeable focal  abnormality PSYCH: pleasant and cooperative, no obvious depression or anxiety Lab Results  Component Value Date   WBC 5.6 02/06/2018   HGB 12.8 02/06/2018   HCT 37.5 02/06/2018   PLT 206.0 02/06/2018   GLUCOSE 134 (H) 02/06/2018   CHOL 140  04/27/2018   TRIG 127 04/27/2018   HDL 39 (L) 04/27/2018   LDLDIRECT 136.0 05/05/2016   LDLCALC 76 04/27/2018   ALT 8 04/27/2018   AST 14 04/27/2018   NA  139 02/06/2018   K 4.6 02/06/2018   CL 100 02/06/2018   CREATININE 0.81 02/06/2018   BUN 12 02/06/2018   CO2 32 02/06/2018   TSH 3.11 02/06/2018   INR 1.0 09/22/2016   HGBA1C 6.7 06/15/2018   MICROALBUR 4.3 (H) 10/06/2016   BP Readings from Last 3 Encounters:  06/15/18 (!) 160/60  04/27/18 138/84  04/11/18 (!) 179/78   Wt Readings from Last 3 Encounters:  06/15/18 126 lb (57.2 kg)  04/27/18 126 lb 3.2 oz (57.2 kg)  04/11/18 128 lb 12.8 oz (58.4 kg)    ASSESSMENT AND PLAN:  Discussed the following assessment and plan:  Non-insulin dependent type 2 diabetes mellitus ([place]) - improved control continue  - Plan: POC HgB A1c  Essential hypertension  Pure hypercholesterolemia - encouraged contine meds and trial add  ons bnefit much more than risk    Coronary artery disease involving native coronary artery of native heart without angina pectoris  History of stroke  White coat syndrome with diagnosis of hypertension - seems problematic but patient monitoring     says at goal at home   as per cardiology advice also   -Patient advised to return or notify health care team  if  new concerns arise. Total visit 26mins > 50% spent counseling and coordinating care as indicated in above note and in instructions to patient .    Patient Instructions  a1c is improved .   Continue lifestyle intervention healthy eating and exercise . And same medication  Stay on cholesterol medication and monitor bp  For control as we discussed    ROV in  Another 4 -73months or therabouts or as needed   Get flu vaccine in fall     Jeffie Widdowson K. Krystl Wickware M.D.

## 2018-06-15 ENCOUNTER — Encounter: Payer: Self-pay | Admitting: Internal Medicine

## 2018-06-15 ENCOUNTER — Ambulatory Visit: Payer: Medicare Other | Admitting: Internal Medicine

## 2018-06-15 VITALS — BP 160/60 | HR 66 | Temp 98.1°F | Wt 126.0 lb

## 2018-06-15 DIAGNOSIS — I10 Essential (primary) hypertension: Secondary | ICD-10-CM

## 2018-06-15 DIAGNOSIS — I2510 Atherosclerotic heart disease of native coronary artery without angina pectoris: Secondary | ICD-10-CM | POA: Diagnosis not present

## 2018-06-15 DIAGNOSIS — E119 Type 2 diabetes mellitus without complications: Secondary | ICD-10-CM | POA: Diagnosis not present

## 2018-06-15 DIAGNOSIS — Z8673 Personal history of transient ischemic attack (TIA), and cerebral infarction without residual deficits: Secondary | ICD-10-CM

## 2018-06-15 DIAGNOSIS — E7800 Pure hypercholesterolemia, unspecified: Secondary | ICD-10-CM

## 2018-06-15 LAB — POCT GLYCOSYLATED HEMOGLOBIN (HGB A1C): HEMOGLOBIN A1C: 6.7 % (ref 4.0–5.6)

## 2018-06-15 NOTE — Patient Instructions (Signed)
a1c is improved .   Continue lifestyle intervention healthy eating and exercise . And same medication  Stay on cholesterol medication and monitor bp  For control as we discussed    ROV in  Another 4 -37months or therabouts or as needed   Get flu vaccine in fall

## 2018-06-26 ENCOUNTER — Encounter: Payer: Self-pay | Admitting: Internal Medicine

## 2018-07-01 ENCOUNTER — Other Ambulatory Visit: Payer: Self-pay | Admitting: Cardiology

## 2018-07-11 ENCOUNTER — Other Ambulatory Visit: Payer: Medicare Other

## 2018-07-18 ENCOUNTER — Ambulatory Visit (HOSPITAL_COMMUNITY)
Admission: RE | Admit: 2018-07-18 | Discharge: 2018-07-18 | Disposition: A | Discharge status: Home / Self Care | Payer: Medicare Other | Source: Ambulatory Visit | Attending: Cardiology | Admitting: Cardiology

## 2018-07-18 DIAGNOSIS — I6523 Occlusion and stenosis of bilateral carotid arteries: Secondary | ICD-10-CM

## 2018-07-23 ENCOUNTER — Telehealth: Payer: Self-pay

## 2018-07-23 NOTE — Telephone Encounter (Signed)
-----   Message from Dorothy Spark, MD sent at 07/21/2018  8:53 PM EDT ----- Bilateral carotid 40-59% stenosis. No intervention needed Repeat in 12 months   Re

## 2018-07-23 NOTE — Telephone Encounter (Signed)
Notes recorded by Frederik Schmidt, RN on 07/23/2018 at 8:51 AM EDT Informed patient of results/recommendations. She verbalized understanding. ------

## 2018-08-07 ENCOUNTER — Other Ambulatory Visit: Payer: Medicare Other

## 2018-08-08 ENCOUNTER — Other Ambulatory Visit: Payer: Medicare Other | Admitting: *Deleted

## 2018-08-08 DIAGNOSIS — E785 Hyperlipidemia, unspecified: Secondary | ICD-10-CM

## 2018-08-08 LAB — LIPID PANEL
CHOLESTEROL TOTAL: 102 mg/dL (ref 100–199)
Chol/HDL Ratio: 3 ratio (ref 0.0–4.4)
HDL: 34 mg/dL — ABNORMAL LOW (ref >39)
LDL CALC: 48 mg/dL (ref 0–99)
Triglycerides: 99 mg/dL (ref 0–149)
VLDL Cholesterol Cal: 20 mg/dL (ref 5–40)

## 2018-08-08 LAB — ALT: ALT: 9 IU/L (ref 0–32)

## 2018-08-16 ENCOUNTER — Other Ambulatory Visit: Payer: Self-pay | Admitting: Internal Medicine

## 2018-08-16 ENCOUNTER — Other Ambulatory Visit: Payer: Self-pay | Admitting: Cardiology

## 2018-09-05 ENCOUNTER — Other Ambulatory Visit: Payer: Self-pay

## 2018-09-05 MED ORDER — ISOSORBIDE MONONITRATE ER 30 MG PO TB24: 15.0000 mg | ORAL_TABLET | Freq: Every day | ORAL | 1 refills | Status: DC

## 2018-09-06 ENCOUNTER — Other Ambulatory Visit: Payer: Self-pay | Admitting: Internal Medicine

## 2018-09-10 ENCOUNTER — Other Ambulatory Visit: Payer: Self-pay | Admitting: Internal Medicine

## 2018-10-10 ENCOUNTER — Ambulatory Visit: Payer: Medicare Other | Admitting: Vascular Surgery

## 2018-10-10 ENCOUNTER — Encounter (HOSPITAL_COMMUNITY): Payer: Medicare Other

## 2018-10-25 ENCOUNTER — Other Ambulatory Visit: Payer: Self-pay

## 2018-10-25 DIAGNOSIS — I70213 Atherosclerosis of native arteries of extremities with intermittent claudication, bilateral legs: Secondary | ICD-10-CM

## 2018-11-14 ENCOUNTER — Ambulatory Visit (HOSPITAL_COMMUNITY)
Admission: RE | Admit: 2018-11-14 | Discharge: 2018-11-14 | Disposition: A | Discharge status: Home / Self Care | Payer: Medicare Other | Source: Ambulatory Visit | Attending: Internal Medicine | Admitting: Internal Medicine

## 2018-11-14 ENCOUNTER — Ambulatory Visit (INDEPENDENT_AMBULATORY_CARE_PROVIDER_SITE_OTHER): Payer: Medicare Other | Admitting: Vascular Surgery

## 2018-11-14 ENCOUNTER — Encounter: Payer: Self-pay | Admitting: Vascular Surgery

## 2018-11-14 ENCOUNTER — Other Ambulatory Visit: Payer: Self-pay

## 2018-11-14 VITALS — BP 165/68 | HR 65 | Temp 97.2°F | Resp 18 | Ht 62.0 in | Wt 126.0 lb

## 2018-11-14 DIAGNOSIS — I70213 Atherosclerosis of native arteries of extremities with intermittent claudication, bilateral legs: Secondary | ICD-10-CM | POA: Diagnosis not present

## 2018-11-14 NOTE — Progress Notes (Signed)
Patient name: Sharon Wilson MRN: [id number] DOB: October 16, 1941 Sex: female  REASON FOR VISIT:   Follow-up of claudication.  HPI:   Sharon Wilson is a pleasant 78 y.o. female who was seen by Dr. Adele Barthel on 04/11/2018 with peripheral vascular disease.  At that time she had an ABI of 96% on the right with biphasic Doppler signals in the right foot.  ABI on the left was 82% with biphasic Doppler signals.  She comes in for 78-month follow-up visit.  Since she was seen last, she continues to have some pain in her legs with ambulation.  This is mostly in her ankles and shins and to some degree her right knee which apparently has arthritis.  I do not get any history of calf thigh or hip claudication.  She denies any history of rest pain or nonhealing ulcers.  She did have a TIA 2 years ago and underwent an extensive work-up which was unremarkable.  She apparently has routine carotid duplex scans done elsewhere.  Past Medical History:  Diagnosis Date  . Abnormal echocardiogram    a.07/2016 Echo: EF 50-55%, basal and mid inferolateral AK.  . Agatston coronary artery calcium score greater than 400    a. 07/2016 Abnl Echo with basal and mid inferolateral AK, EF 50-55%; b. 09/01/2016 Ex MV: EF 46%, hypertensive response, no ST changes, apical thinning with very mild apical ischemia;  c. 09/21/2016 Cardiac CTA: Ca2+ score = 717, 90th percentile. Extensive CAD w/ mod stenosis in distal LM/ostial LAD, 50% RPLV-->rec Cath.  . Anemia    hysterectomy  . CAD (coronary artery disease)    a. 09/01/2016 Ex MV: EF 46%, hypertensive response, no ST changes, apical thinning with very mild apical ischemia;  c. 09/21/2016 Cardiac CTA: Ca2+ score = 717, 90th percentile. Extensive CAD w/ mod stenosis in distal LM/ostial LAD, 50% RPLV-->rec Cath  . 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 Diverticulosis   . Hyperlipidemia   . OA (osteoarthritis)   .  Stroke (cerebrum) (Sharon Wilson)    a. 07/2016 L facial droop-->Head CT: small area of low attenuation in the right frontoparietal region c/w acute or subacute lacunar infarct; 07/2016 MRI/A brain: acute infarct of right centrum semiovale and possibly posterior limb of right internal capsule. Mild to moderate multifocal narrowing of the MCA arteries w/o occlusion.  . Type II diabetes mellitus ([place])    a. 07/2016 A1c 7.3.  . White coat hypertension     Family History  Problem Relation Age of Onset  . Cancer Mother        breast  . Breast cancer Mother   . CAD Neg Hx     SOCIAL HISTORY: Social History   Tobacco Use  . Smoking status: Never Smoker  . Smokeless tobacco: Never Used  Substance Use Topics  . Alcohol use: No    Alcohol/week: 0.0 standard drinks    Allergies  Allergen Reactions  . Ace Inhibitors Hives, Swelling and Cough  . Lovastatin Other (See Comments)    Leg cramps,  NO statins  . Norvasc [Amlodipine Besylate]     Light headed and vertigo like  Also may have had hives.     Current Outpatient Medications  Medication Sig Dispense Refill  . acetaminophen (TYLENOL) 325 MG tablet Take 2 tablets (650 mg total) by mouth every 4 (four) hours as needed for mild pain or moderate pain.    Marland Kitchen aspirin EC  81 MG tablet Take 1 tablet (81 mg total) by mouth daily.    . Calcium Carbonate-Vitamin D (OSCAL 500/200 D-3 PO) Take 1 tablet by mouth 2 (two) times daily.     . carvedilol (COREG) 12.5 MG tablet Take 1 and 1/2 tablets by mouth twice daily with meals 270 tablet 0  . clopidogrel (PLAVIX) 75 MG tablet Take 1 tablet (75 mg total) by mouth daily. 90 tablet 3  . isosorbide mononitrate (IMDUR) 30 MG 24 hr tablet Take 0.5 tablets (15 mg total) by mouth daily. 60 tablet 1  . metFORMIN (GLUCOPHAGE) 500 MG tablet TAKE ONE TABLET BY MOUTH IN THE MORNING AND TAKE TWO TABLETS IN THE EVENING 90 tablet 3  . nitroGLYCERIN (NITROSTAT) 0.4 MG SL tablet Place 1 tablet (0.4 mg total) under the tongue  every 5 (five) minutes x 3 doses as needed for chest pain. 25 tablet 12  . rosuvastatin (CRESTOR) 5 MG tablet Take 1 tablet by mouth daily as tolerated. (Patient taking differently: Take 5 mg by mouth at bedtime. ) 90 tablet 3   No current facility-administered medications for this visit.     REVIEW OF SYSTEMS:  [X]  denotes positive finding, [ ]  denotes negative finding Cardiac  Comments:  Chest pain or chest pressure:    Shortness of breath upon exertion:    Short of breath when lying flat:    Irregular heart rhythm:        Vascular    Pain in calf, thigh, or hip brought on by ambulation:    Pain in feet at night that wakes you up from your sleep:     Blood clot in your veins:    Leg swelling:         Pulmonary    Oxygen at home:    Productive cough:     Wheezing:         Neurologic    Sudden weakness in arms or legs:     Sudden numbness in arms or legs:     Sudden onset of difficulty speaking or slurred speech:    Temporary loss of vision in one eye:     Problems with dizziness:         Gastrointestinal    Blood in stool:     Vomited blood:         Genitourinary    Burning when urinating:     Blood in urine:        Psychiatric    Major depression:         Hematologic    Bleeding problems:    Problems with blood clotting too easily:        Skin    Rashes or ulcers:        Constitutional    Fever or chills:     PHYSICAL EXAM:   Vitals:   11/14/18 1151  BP: (!) 165/68  Pulse: 65  Resp: 18  Temp: (!) 97.2 F (36.2 C)  SpO2: 100%  Weight: 126 lb (57.2 kg)  Height: 5\' 2"  (1.575 m)    GENERAL: The patient is a well-nourished female, in no acute distress. The vital signs are documented above. CARDIAC: There is a regular rate and rhythm.  VASCULAR: I do not detect carotid bruits. She has palpable femoral pulses. I cannot palpate pedal pulses. She has no significant lower extremity swelling. PULMONARY: There is good air exchange bilaterally without  wheezing or rales. ABDOMEN: Soft and non-tender with normal pitched bowel sounds.  MUSCULOSKELETAL: There are no major deformities or cyanosis. NEUROLOGIC: No focal weakness or paresthesias are detected. SKIN: There are no ulcers or rashes noted. PSYCHIATRIC: The patient has a normal affect.  DATA:    ARTERIAL DOPPLER STUDY: I have independently interpreted her arterial Doppler study today.  On the right side there is a triphasic posterior tibial signal with a biphasic dorsalis pedis signal.  ABI is 100%.  On the left side there is a monophasic posterior tibial signal with a triphasic dorsalis pedis signal.  ABI is 80%.  CAROTID DUPLEX: I did find a carotid duplex scan that was done on 07/18/2018.  This showed a less than 50% right carotid stenosis and a 40 to 59% left carotid stenosis.  MEDICAL ISSUES:   PERIPHERAL VASCULAR DISEASE: This patient has mild peripheral vascular disease more so on the left side.  However she is asymptomatic.  She has no calf hip or thigh claudication and no rest pain or nonhealing ulcers.  She is not a smoker.  She does have some evidence of infrainguinal arterial occlusive disease on exam.  However her toe pressure is 115 on the right 107 on the left suggesting adequate perfusion.  I would not recommend an aggressive work-up for this unless she developed disabling claudication, rest pain, or nonhealing ulcer.  The symptoms she is having in her shins and ankles I do not think can be attributed to peripheral vascular disease.  I will be happy to see her back at any time if any new vascular issues arise.  Her mild carotid disease is followed elsewhere.  Deitra Mayo [facility] [phone]

## 2018-11-15 NOTE — Progress Notes (Signed)
Chief Complaint  Patient presents with  . Follow-up    diabetes. Pt has no concerns today and believes her diabetes is under controll    HPI: Sharon Wilson 78 y.o. come in for Chronic disease management    DM : BG   Less than 120  But well  Had dental implant an recovering  From this and holidays  No lows needs refill metformin  BP has  Been in 110  120 at home   PVD no surgical   intervnetion since no sx .see vasc consult   medical rx advised  Per vascular   Activity now back to  Bowl still  Walking some  500 steps   Knee limiting.  Gets frustrated with some weakness of ue   But no progression of sx   HLD tolerating statin so far  ROS: See pertinent positives and negatives per HPI. No cp sob falling new numbness vision change  No bleeding  Past Medical History:  Diagnosis Date  . Abnormal echocardiogram    a.07/2016 Echo: EF 50-55%, basal and mid inferolateral AK.  . Agatston coronary artery calcium score greater than 400    a. 07/2016 Abnl Echo with basal and mid inferolateral AK, EF 50-55%; b. 09/01/2016 Ex MV: EF 46%, hypertensive response, no ST changes, apical thinning with very mild apical ischemia;  c. 09/21/2016 Cardiac CTA: Ca2+ score = 717, 90th percentile. Extensive CAD w/ mod stenosis in distal LM/ostial LAD, 50% RPLV-->rec Cath.  . Anemia    hysterectomy  . CAD (coronary artery disease)    a. 09/01/2016 Ex MV: EF 46%, hypertensive response, no ST changes, apical thinning with very mild apical ischemia;  c. 09/21/2016 Cardiac CTA: Ca2+ score = 717, 90th percentile. Extensive CAD w/ mod stenosis in distal LM/ostial LAD, 50% RPLV-->rec Cath  . 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 Diverticulosis   . Hyperlipidemia   . OA (osteoarthritis)   . Stroke (cerebrum) ([place])    a. 07/2016 L facial droop-->Head CT: small area of low attenuation in the right frontoparietal region c/w acute or subacute lacunar  infarct; 07/2016 MRI/A brain: acute infarct of right centrum semiovale and possibly posterior limb of right internal capsule. Mild to moderate multifocal narrowing of the MCA arteries w/o occlusion.  . Type II diabetes mellitus ([place])    a. 07/2016 A1c 7.3.  . White coat hypertension     Family History  Problem Relation Age of Onset  . Cancer Mother        breast  . Breast cancer Mother   . CAD Neg Hx     Social History   Socioeconomic History  . Marital status: Married    Spouse name: Not on file  . Number of children: Not on file  . Years of education: Not on file  . Highest education level: Not on file  Occupational History  . Not on file  Social Needs  . Financial resource strain: Not on file  . Food insecurity:    Worry: Not on file    Inability: Not on file  . Transportation needs:    Medical: Not on file    Non-medical: Not on file  Tobacco Use  . Smoking status: Never Smoker  . Smokeless tobacco: Never Used  Substance and Sexual Activity  . Alcohol use: No    Alcohol/week: 0.0 standard drinks  . Drug use: No  . Sexual activity:  Not on file  Lifestyle  . Physical activity:    Days per week: Not on file    Minutes per session: Not on file  . Stress: Not on file  Relationships  . Social connections:    Talks on phone: Not on file    Gets together: Not on file    Attends religious service: Not on file    Active member of club or organization: Not on file    Attends meetings of clubs or organizations: Not on file    Relationship status: Not on file  Other Topics Concern  . Not on file  Social History Narrative   Lives in [place] with husband.   Retired.   hh of 2   No pets    Outpatient Medications Prior to Visit  Medication Sig Dispense Refill  . acetaminophen (TYLENOL) 325 MG tablet Take 2 tablets (650 mg total) by mouth every 4 (four) hours as needed for mild pain or moderate pain.    Marland Kitchen aspirin EC 81 MG tablet Take 1 tablet (81 mg total) by mouth daily.     . Calcium Carbonate-Vitamin D (OSCAL 500/200 D-3 PO) Take 1 tablet by mouth 2 (two) times daily.     . carvedilol (COREG) 12.5 MG tablet Take 1 and 1/2 tablets by mouth twice daily with meals 270 tablet 0  . clopidogrel (PLAVIX) 75 MG tablet Take 1 tablet (75 mg total) by mouth daily. 90 tablet 3  . isosorbide mononitrate (IMDUR) 30 MG 24 hr tablet Take 0.5 tablets (15 mg total) by mouth daily. 60 tablet 1  . nitroGLYCERIN (NITROSTAT) 0.4 MG SL tablet Place 1 tablet (0.4 mg total) under the tongue every 5 (five) minutes x 3 doses as needed for chest pain. 25 tablet 12  . rosuvastatin (CRESTOR) 5 MG tablet Take 1 tablet by mouth daily as tolerated. (Patient taking differently: Take 5 mg by mouth at bedtime. ) 90 tablet 3  . metFORMIN (GLUCOPHAGE) 500 MG tablet TAKE ONE TABLET BY MOUTH IN THE MORNING AND TAKE TWO TABLETS IN THE EVENING 90 tablet 3   No facility-administered medications prior to visit.      EXAM:  BP 136/88 (BP Location: Right Arm, Patient Position: Sitting, Cuff Size: Normal)   Pulse 90   Temp 97.7 F (36.5 C) (Oral)   Ht 5' 1.75" (1.568 m)   Wt 121 lb 14.4 oz (55.3 kg)   BMI 22.48 kg/m   Body mass index is 22.48 kg/m.  GENERAL: vitals reviewed and listed above, alert, oriented, appears well hydrated and in no acute distress HEENT: atraumatic, conjunctiva  clear, no obvious abnormalities on inspection of external nose and ears NECK: no obvious masses on inspection palpation  LUNGS: clear to auscultation bilaterally, no wheezes, rales or rhonchi, good air movement CV: HRRR, no clubbing cyanosis or  peripheral edema nl cap refill  Abdomen:  Sof,t normal bowel sounds without hepatosplenomegaly, no guarding rebound or masses no CVA tenderness MS: moves all extremities without noticeable focal  abnormality PSYCH: pleasant and cooperative, no obvious depression or anxiety Lab Results  Component Value Date   WBC 5.6 02/06/2018   HGB 12.8 02/06/2018   HCT 37.5  02/06/2018   PLT 206.0 02/06/2018   GLUCOSE 134 (H) 02/06/2018   CHOL 102 08/08/2018   TRIG 99 08/08/2018   HDL 34 (L) 08/08/2018   LDLDIRECT 136.0 05/05/2016   LDLCALC 48 08/08/2018   ALT 9 08/08/2018   AST 14 04/27/2018   NA 139  02/06/2018   K 4.6 02/06/2018   CL 100 02/06/2018   CREATININE 0.81 02/06/2018   BUN 12 02/06/2018   CO2 32 02/06/2018   TSH 3.11 02/06/2018   INR 1.0 09/22/2016   HGBA1C 6.7 06/15/2018   MICROALBUR 4.3 (H) 10/06/2016   BP Readings from Last 3 Encounters:  11/16/18 136/88  11/14/18 (!) 165/68  06/15/18 (!) 160/60   Wt Readings from Last 3 Encounters:  11/16/18 121 lb 14.4 oz (55.3 kg)  11/14/18 126 lb (57.2 kg)  06/15/18 126 lb (57.2 kg)    ASSESSMENT AND PLAN:  Discussed the following assessment and plan:  Non-insulin dependent type 2 diabetes mellitus ([place]) - Plan: Basic metabolic panel, Hemoglobin A1c, Hepatic function panel, Lipid panel, CBC with Differential/Platelet, Microalbumin / creatinine urine ratio  Essential hypertension - Plan: Basic metabolic panel, Hemoglobin A1c, Hepatic function panel, Lipid panel, CBC with Differential/Platelet, Microalbumin / creatinine urine ratio  Peripheral arterial disease (HCC) - Plan: Basic metabolic panel, Hemoglobin A1c, Hepatic function panel, Lipid panel, CBC with Differential/Platelet, Microalbumin / creatinine urine ratio  Medication management - Plan: Basic metabolic panel, Hemoglobin A1c, Hepatic function panel, Lipid panel, CBC with Differential/Platelet, Microalbumin / creatinine urine ratio  History of CVA in adulthood - Plan: Basic metabolic panel, Hemoglobin A1c, Hepatic function panel, Lipid panel, CBC with Differential/Platelet, Microalbumin / creatinine urine ratio  Hyperlipidemia, unspecified hyperlipidemia type - Plan: Basic metabolic panel, Hemoglobin A1c, Hepatic function panel, Lipid panel, CBC with Differential/Platelet, Microalbumin / creatinine urine ratio   Expectant  management. And med management and monitoring  utd  -Patient advised to return or notify health care team  if  new concerns arise. ininterim   Patient Instructions  Will notify you  of labs when available.    Refilled metformin today .   Plan  If   All ok then  cpx in 6 months  Standley Brooking. Panosh M.D.

## 2018-11-16 ENCOUNTER — Encounter: Payer: Self-pay | Admitting: Internal Medicine

## 2018-11-16 ENCOUNTER — Ambulatory Visit: Payer: Medicare Other | Admitting: Internal Medicine

## 2018-11-16 VITALS — BP 136/88 | HR 90 | Temp 97.7°F | Ht 61.75 in | Wt 121.9 lb

## 2018-11-16 DIAGNOSIS — Z79899 Other long term (current) drug therapy: Secondary | ICD-10-CM

## 2018-11-16 DIAGNOSIS — E119 Type 2 diabetes mellitus without complications: Secondary | ICD-10-CM | POA: Diagnosis not present

## 2018-11-16 DIAGNOSIS — I739 Peripheral vascular disease, unspecified: Secondary | ICD-10-CM | POA: Diagnosis not present

## 2018-11-16 DIAGNOSIS — E785 Hyperlipidemia, unspecified: Secondary | ICD-10-CM

## 2018-11-16 DIAGNOSIS — Z8673 Personal history of transient ischemic attack (TIA), and cerebral infarction without residual deficits: Secondary | ICD-10-CM

## 2018-11-16 DIAGNOSIS — I10 Essential (primary) hypertension: Secondary | ICD-10-CM

## 2018-11-16 LAB — BASIC METABOLIC PANEL
BUN: 11 mg/dL (ref 6–23)
CALCIUM: 9.5 mg/dL (ref 8.4–10.5)
CO2: 30 mEq/L (ref 19–32)
Chloride: 102 mEq/L (ref 96–112)
Creatinine, Ser: 0.81 mg/dL (ref 0.40–1.20)
GFR: 72.57 mL/min (ref >60.00)
Glucose, Bld: 141 mg/dL — ABNORMAL HIGH (ref 70–99)
Potassium: 4.4 mEq/L (ref 3.5–5.1)
Sodium: 140 mEq/L (ref 135–145)

## 2018-11-16 LAB — MICROALBUMIN / CREATININE URINE RATIO
Creatinine,U: 93.7 mg/dL
Microalb Creat Ratio: 2 mg/g (ref 0.0–30.0)
Microalb, Ur: 1.9 mg/dL (ref 0.0–1.9)

## 2018-11-16 LAB — LIPID PANEL
CHOLESTEROL: 126 mg/dL (ref 0–200)
HDL: 45.1 mg/dL (ref >39.00)
LDL Cholesterol: 57 mg/dL (ref 0–99)
NonHDL: 81.23
TRIGLYCERIDES: 119 mg/dL (ref 0.0–149.0)
Total CHOL/HDL Ratio: 3
VLDL: 23.8 mg/dL (ref 0.0–40.0)

## 2018-11-16 LAB — CBC WITH DIFFERENTIAL/PLATELET
BASOS PCT: 0.7 % (ref 0.0–3.0)
Basophils Absolute: 0 10*3/uL (ref 0.0–0.1)
EOS PCT: 10.6 % — AB (ref 0.0–5.0)
Eosinophils Absolute: 0.5 10*3/uL (ref 0.0–0.7)
HEMATOCRIT: 37.2 % (ref 36.0–46.0)
Hemoglobin: 12.6 g/dL (ref 12.0–15.0)
LYMPHS ABS: 0.7 10*3/uL (ref 0.7–4.0)
LYMPHS PCT: 13 % (ref 12.0–46.0)
MCHC: 33.9 g/dL (ref 30.0–36.0)
MCV: 90.6 fl (ref 78.0–100.0)
MONOS PCT: 7.4 % (ref 3.0–12.0)
Monocytes Absolute: 0.4 10*3/uL (ref 0.1–1.0)
NEUTROS ABS: 3.5 10*3/uL (ref 1.4–7.7)
NEUTROS PCT: 68.3 % (ref 43.0–77.0)
PLATELETS: 193 10*3/uL (ref 150.0–400.0)
RBC: 4.1 Mil/uL (ref 3.87–5.11)
RDW: 12.9 % (ref 11.5–15.5)
WBC: 5.2 10*3/uL (ref 4.0–10.5)

## 2018-11-16 LAB — HEPATIC FUNCTION PANEL
ALBUMIN: 4.4 g/dL (ref 3.5–5.2)
ALT: 9 U/L (ref 0–35)
AST: 15 U/L (ref 0–37)
Alkaline Phosphatase: 52 U/L (ref 39–117)
BILIRUBIN TOTAL: 0.6 mg/dL (ref 0.2–1.2)
Bilirubin, Direct: 0.2 mg/dL (ref 0.0–0.3)
Total Protein: 6.4 g/dL (ref 6.0–8.3)

## 2018-11-16 LAB — HEMOGLOBIN A1C: HEMOGLOBIN A1C: 7.4 % — AB (ref 4.6–6.5)

## 2018-11-16 MED ORDER — METFORMIN HCL 500 MG PO TABS: ORAL_TABLET | ORAL | 3 refills | Status: DC

## 2018-11-16 NOTE — Patient Instructions (Addendum)
Will notify you  of labs when available.    Refilled metformin today .   Plan  If   All ok then  cpx in 6 months

## 2018-12-04 ENCOUNTER — Other Ambulatory Visit: Payer: Self-pay | Admitting: Internal Medicine

## 2018-12-06 ENCOUNTER — Other Ambulatory Visit: Payer: Self-pay | Admitting: Internal Medicine

## 2018-12-24 NOTE — Progress Notes (Signed)
Cardiology Office Note:    Date:  12/25/2018   ID:  Sharon Wilson, DOB [birth year], MRN [id number]  PCP:  [name], MD  Cardiologist:  No primary care provider on file.    Referring MD: [name], MD   Chief Complaint  Patient presents with  . Coronary Artery Disease  . Hypertension  . Hyperlipidemia    History of Present Illness:    Sharon Wilson is a 78 y.o. female with a hx of R brain CVA in 9/17, DM, HL, carotid artery disease and white coat HTN. Echo after her CVA demonstrated normal LVF butinferolateral AK prompting an ischemic workup. Nuclear stress test demonstrated apical thinning and very mild apical ischemia. Coronary CTA revealed a coronary calcium score of 717, placing her in the 90th percentile. She also had suggestion of distal left main/ostial LAD plaquing of less than 50% and an area in the RPLV that was approximately 50% as well. She underwent cath showing severe 3 vessel ASCAD.  She was seen by Dr. Servando Snare on 10/03/16 and giventhe patient's relatively recent stroke, lack of symptoms and negative stress test, he has opted to hold off on surgery, as her risk of recurrent stroke would be significantly greater. She was seen again in f/u by Dr. Earl Lagos on 10/27/16 and overall consensus wasto continue to treat medically, for now, given her lack of symptoms.  She is here today for followup and is doing well.  She denies any chest pain or pressure, SOB, DOE, PND, orthopnea, LE edema, dizziness, palpitations or syncope. She is compliant with her meds and is tolerating meds with no SE.    Past Medical History:  Diagnosis Date  . Abnormal echocardiogram    a.07/2016 Echo: EF 50-55%, basal and mid inferolateral AK.  . Agatston coronary artery calcium score greater than 400    a. 07/2016 Abnl Echo with basal and mid inferolateral AK, EF 50-55%; b. 09/01/2016 Ex MV: EF 46%, hypertensive response, no ST changes, apical thinning with very mild apical  ischemia;  c. 09/21/2016 Cardiac CTA: Ca2+ score = 717, 90th percentile. Extensive CAD w/ mod stenosis in distal LM/ostial LAD, 50% RPLV-->rec Cath.  . Anemia    hysterectomy  . CAD (coronary artery disease)    a. 09/01/2016 Ex MV: EF 46%, hypertensive response, no ST changes, apical thinning with very mild apical ischemia;  c. 09/21/2016 Cardiac CTA: Ca2+ score = 717, 90th percentile. Extensive CAD w/ mod stenosis in distal LM/ostial LAD, 50% RPLV-->rec Cath  . 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 Diverticulosis   . Hyperlipidemia   . OA (osteoarthritis)   . Stroke (cerebrum) ([place])    a. 07/2016 L facial droop-->Head CT: small area of low attenuation in the right frontoparietal region c/w acute or subacute lacunar infarct; 07/2016 MRI/A brain: acute infarct of right centrum semiovale and possibly posterior limb of right internal capsule. Mild to moderate multifocal narrowing of the MCA arteries w/o occlusion.  . Type II diabetes mellitus ([place])    a. 07/2016 A1c 7.3.  . White coat hypertension     Past Surgical History:  Procedure Laterality Date  . ABDOMINAL HYSTERECTOMY     fibroid bleeding  . CARDIAC CATHETERIZATION N/A 09/23/2016   Procedure: Left Heart Cath and Coronary Angiography;  Surgeon: Nelva Bush, MD;  Location: [facility];  Service: Cardiovascular;  Laterality: N/A;  . CARDIAC CATHETERIZATION N/A 09/23/2016  Procedure: Intravascular Pressure Wire/FFR Study;  Surgeon: Nelva Bush, MD;  Location: [facility];  Service: Cardiovascular;  Laterality: N/A;  . carotid dopplers  11/11   per [facility]  . CATARACT EXTRACTION  2011    Current Medications: Current Meds  Medication Sig  . acetaminophen (TYLENOL) 325 MG tablet Take 2 tablets (650 mg total) by mouth every 4 (four) hours as needed for mild pain or moderate pain.  Marland Kitchen aspirin EC 81 MG tablet Take 1 tablet (81 mg total) by mouth daily.    . Calcium Carbonate-Vitamin D (OSCAL 500/200 D-3 PO) Take 1 tablet by mouth 2 (two) times daily.   . carvedilol (COREG) 12.5 MG tablet Take one and one-half tablets by mouth twice daily with meals  . clopidogrel (PLAVIX) 75 MG tablet Take 1 tablet (75 mg total) by mouth daily.  . isosorbide mononitrate (IMDUR) 30 MG 24 hr tablet Take 0.5 tablets (15 mg total) by mouth daily.  . metFORMIN (GLUCOPHAGE) 500 MG tablet TAKE ONE TABLET BY MOUTH IN THE MORNING AND TAKE TWO TABLETS IN THE EVENING  . nitroGLYCERIN (NITROSTAT) 0.4 MG SL tablet Place 1 tablet (0.4 mg total) under the tongue every 5 (five) minutes x 3 doses as needed for chest pain.  . rosuvastatin (CRESTOR) 5 MG tablet Take 1 tablet by mouth daily as tolerated. (Patient taking differently: Take 5 mg by mouth at bedtime. )     Allergies:   Ace inhibitors; Lovastatin; and Norvasc [amlodipine besylate]   Social History   Socioeconomic History  . Marital status: Married    Spouse name: Not on file  . Number of children: Not on file  . Years of education: Not on file  . Highest education level: Not on file  Occupational History  . Not on file  Social Needs  . Financial resource strain: Not on file  . Food insecurity:    Worry: Not on file    Inability: Not on file  . Transportation needs:    Medical: Not on file    Non-medical: Not on file  Tobacco Use  . Smoking status: Never Smoker  . Smokeless tobacco: Never Used  Substance and Sexual Activity  . Alcohol use: No    Alcohol/week: 0.0 standard drinks  . Drug use: No  . Sexual activity: Not on file  Lifestyle  . Physical activity:    Days per week: Not on file    Minutes per session: Not on file  . Stress: Not on file  Relationships  . Social connections:    Talks on phone: Not on file    Gets together: Not on file    Attends religious service: Not on file    Active member of club or organization: Not on file    Attends meetings of clubs or organizations: Not on  file    Relationship status: Not on file  Other Topics Concern  . Not on file  Social History Narrative   Lives in [place] with husband.   Retired.   hh of 2   No pets     Family History: The patient's family history includes Breast cancer in her mother; Cancer in her mother. There is no history of CAD.  ROS:   Please see the history of present illness.    ROS  All other systems reviewed and negative.   EKGs/Labs/Other Studies Reviewed:    The following studies were reviewed today: none  EKG:  EKG is not ordered today.  Recent Labs: 02/06/2018: TSH 3.11 11/16/2018: ALT 9; BUN 11; Creatinine, Ser 0.81; Hemoglobin 12.6; Platelets 193.0; Potassium 4.4; Sodium 140   Recent Lipid Panel    Component Value Date/Time   CHOL 126 11/16/2018 0903   CHOL 102 08/08/2018 0845   TRIG 119.0 11/16/2018 0903   HDL 45.10 11/16/2018 0903   HDL 34 (L) 08/08/2018 0845   CHOLHDL 3 11/16/2018 0903   VLDL 23.8 11/16/2018 0903   LDLCALC 57 11/16/2018 0903   LDLCALC 48 08/08/2018 0845   LDLDIRECT 136.0 05/05/2016 0816    Physical Exam:    VS:  BP (!) 182/84   Pulse 68   Ht 5' 1.75" (1.568 m)   Wt 121 lb 6.4 oz (55.1 kg)   SpO2 99%   BMI 22.38 kg/m     Wt Readings from Last 3 Encounters:  12/25/18 121 lb 6.4 oz (55.1 kg)  11/16/18 121 lb 14.4 oz (55.3 kg)  11/14/18 126 lb (57.2 kg)     GEN:  Well nourished, well developed in no acute distress HEENT: Normal NECK: No JVD; No carotid bruits LYMPHATICS: No lymphadenopathy CARDIAC: RRR, no murmurs, rubs, gallops RESPIRATORY:  Clear to auscultation without rales, wheezing or rhonchi  ABDOMEN: Soft, non-tender, non-distended MUSCULOSKELETAL:  No edema; No deformity  SKIN: Warm and dry NEUROLOGIC:  Alert and oriented x 3 PSYCHIATRIC:  Normal affect   ASSESSMENT:    1. Coronary artery disease involving native coronary artery of native heart without angina pectoris   2. Essential hypertension   3. Peripheral arterial disease ([place])    4. Pure hypercholesterolemia    PLAN:    In order of problems listed above:  1.  ASCAD - severe 3v CAD - medical management recommended by CVTS given recent CVA at the time of cath in 2017 and lack of sx.  She has not had any anginal sx and is feeling well.  She will continue on ASA 81mg  daily, Plaivx 75mg  daily, BB, Imdur 15mg  daily and statin.   2.  HTN - BP is poorly controlled on exam today she has whitecoat hypertension and her blood pressure this morning at home was 124/68 mmHg and yesterday her systolic blood pressure was 106 mmHg.  Will continue on carvedilol 18.75 mg BID.   3.  PAD - she has carotid artery dz with dopplers 07/2018 showing bilateral 40-59% stenosis.  Will repeat dopplers 07/2019.  She also had LE claudication with abnormal ABIs followed by Vascular surgery.  She has been asymptomatic and was recently seen by Dr. Scot Dock and was asymptomatic and the pain she was having in her shins and ankles he felt was not related to PVD.  Continue ASA/Plavix and statin.   4.  Hyperlipidemia - LDL goal is < 70.  Her last LDL was 57 on 11/2018.  She will continue on Crestor 5mg  daily.     Medication Adjustments/Labs and Tests Ordered: Current medicines are reviewed at length with the patient today.  Concerns regarding medicines are outlined above.  No orders of the defined types were placed in this encounter.  No orders of the defined types were placed in this encounter.   Signed, Fransico Him, MD  12/25/2018 8:50 AM    [place]

## 2018-12-25 ENCOUNTER — Ambulatory Visit (INDEPENDENT_AMBULATORY_CARE_PROVIDER_SITE_OTHER): Payer: Medicare Other | Admitting: Cardiology

## 2018-12-25 ENCOUNTER — Encounter: Payer: Self-pay | Admitting: Cardiology

## 2018-12-25 VITALS — BP 182/84 | HR 68 | Ht 61.75 in | Wt 121.4 lb

## 2018-12-25 DIAGNOSIS — I2510 Atherosclerotic heart disease of native coronary artery without angina pectoris: Secondary | ICD-10-CM | POA: Diagnosis not present

## 2018-12-25 DIAGNOSIS — I10 Essential (primary) hypertension: Secondary | ICD-10-CM | POA: Diagnosis not present

## 2018-12-25 DIAGNOSIS — E7800 Pure hypercholesterolemia, unspecified: Secondary | ICD-10-CM

## 2018-12-25 DIAGNOSIS — I739 Peripheral vascular disease, unspecified: Secondary | ICD-10-CM

## 2018-12-25 NOTE — Patient Instructions (Signed)

## 2019-01-09 ENCOUNTER — Other Ambulatory Visit (HOSPITAL_COMMUNITY): Payer: Self-pay | Admitting: Cardiology

## 2019-02-20 ENCOUNTER — Other Ambulatory Visit: Payer: Self-pay | Admitting: Internal Medicine

## 2019-02-28 ENCOUNTER — Other Ambulatory Visit: Payer: Self-pay | Admitting: Internal Medicine

## 2019-03-14 ENCOUNTER — Other Ambulatory Visit (HOSPITAL_COMMUNITY): Payer: Self-pay | Admitting: Cardiology

## 2019-03-14 ENCOUNTER — Other Ambulatory Visit: Payer: Self-pay | Admitting: Cardiology

## 2019-03-14 DIAGNOSIS — I6523 Occlusion and stenosis of bilateral carotid arteries: Secondary | ICD-10-CM

## 2019-03-25 ENCOUNTER — Other Ambulatory Visit: Payer: Self-pay | Admitting: Cardiology

## 2019-03-25 ENCOUNTER — Other Ambulatory Visit: Payer: Self-pay

## 2019-03-25 MED ORDER — METFORMIN HCL 500 MG PO TABS: ORAL_TABLET | ORAL | 0 refills | Status: DC

## 2019-05-20 ENCOUNTER — Other Ambulatory Visit: Payer: Self-pay | Admitting: Internal Medicine

## 2019-05-21 ENCOUNTER — Encounter: Payer: Self-pay | Admitting: Internal Medicine

## 2019-05-21 LAB — HM DIABETES EYE EXAM

## 2019-05-22 ENCOUNTER — Encounter: Payer: Medicare Other | Admitting: Internal Medicine

## 2019-05-23 ENCOUNTER — Other Ambulatory Visit: Payer: Self-pay | Admitting: Internal Medicine

## 2019-06-20 ENCOUNTER — Other Ambulatory Visit: Payer: Self-pay | Admitting: Cardiology

## 2019-07-24 ENCOUNTER — Other Ambulatory Visit: Payer: Self-pay

## 2019-07-24 ENCOUNTER — Ambulatory Visit (HOSPITAL_COMMUNITY)
Admission: RE | Admit: 2019-07-24 | Discharge: 2019-07-24 | Disposition: A | Discharge status: Home / Self Care | Payer: Medicare Other | Source: Ambulatory Visit | Attending: Cardiovascular Disease | Admitting: Cardiovascular Disease

## 2019-07-24 ENCOUNTER — Telehealth: Payer: Self-pay | Admitting: *Deleted

## 2019-07-24 DIAGNOSIS — E7800 Pure hypercholesterolemia, unspecified: Secondary | ICD-10-CM

## 2019-07-24 DIAGNOSIS — I6523 Occlusion and stenosis of bilateral carotid arteries: Secondary | ICD-10-CM

## 2019-07-24 DIAGNOSIS — I2510 Atherosclerotic heart disease of native coronary artery without angina pectoris: Secondary | ICD-10-CM

## 2019-07-24 NOTE — Telephone Encounter (Signed)
Pt made aware of carotid artery results and recommendations per Dr. Meda Coffee.  Informed the pt that per Dr Meda Coffee, no intervention is needed at this time, but she will need to have a repeat carotid doppler done in one year. Informed the pt that I will go ahead and place the order in the system and send a message to our PCC/PV scheduler to call her back around that time to arrange this appt. Pt verbalized understanding and agrees with this plan.

## 2019-07-24 NOTE — Telephone Encounter (Signed)
-----   Message from Dorothy Spark, MD sent at 07/24/2019 11:48 AM EDT ----- Right Carotid: Velocities in the right ICA are consistent with a 40-59% stenosis.  Left Carotid: Velocities in the left ICA are consistent with a 40-59% stenosis.  No intervention needed right now, follow-up scan will be scheduled in 12 months.

## 2019-08-05 ENCOUNTER — Other Ambulatory Visit: Payer: Self-pay | Admitting: Internal Medicine

## 2019-08-14 ENCOUNTER — Other Ambulatory Visit: Payer: Self-pay | Admitting: Internal Medicine

## 2019-09-23 ENCOUNTER — Other Ambulatory Visit: Payer: Self-pay | Admitting: Internal Medicine

## 2019-09-24 NOTE — Telephone Encounter (Signed)
Is due for a1c  Also so would have to do that separate of have in person visit

## 2019-09-24 NOTE — Telephone Encounter (Signed)
lvm for pt to call back to schedule appt can be virtual or in office

## 2019-09-30 NOTE — Telephone Encounter (Signed)
Pt scheduled for virtual.  ?

## 2019-10-01 ENCOUNTER — Encounter: Payer: Self-pay | Admitting: Internal Medicine

## 2019-10-01 ENCOUNTER — Other Ambulatory Visit: Payer: Self-pay

## 2019-10-01 ENCOUNTER — Telehealth (INDEPENDENT_AMBULATORY_CARE_PROVIDER_SITE_OTHER): Payer: Medicare Other | Admitting: Internal Medicine

## 2019-10-01 DIAGNOSIS — I739 Peripheral vascular disease, unspecified: Secondary | ICD-10-CM

## 2019-10-01 DIAGNOSIS — E119 Type 2 diabetes mellitus without complications: Secondary | ICD-10-CM

## 2019-10-01 DIAGNOSIS — Z8673 Personal history of transient ischemic attack (TIA), and cerebral infarction without residual deficits: Secondary | ICD-10-CM

## 2019-10-01 DIAGNOSIS — Z79899 Other long term (current) drug therapy: Secondary | ICD-10-CM | POA: Diagnosis not present

## 2019-10-01 DIAGNOSIS — I10 Essential (primary) hypertension: Secondary | ICD-10-CM | POA: Diagnosis not present

## 2019-10-01 DIAGNOSIS — E785 Hyperlipidemia, unspecified: Secondary | ICD-10-CM

## 2019-10-01 MED ORDER — METFORMIN HCL 500 MG PO TABS: ORAL_TABLET | ORAL | 1 refills | Status: DC

## 2019-10-01 NOTE — Progress Notes (Signed)
Virtual Visit via Telephone Note  I connected with@ on 10/01/19 at  2:00 PM EST by telephone and verified that I am speaking with the correct person using two identifiers.   I discussed the limitations, risks, security and privacy concerns of performing an evaluation and management service by telephone and the availability of in person appointments. I also discussed with the patient that there may be a patient responsible charge related to this service. The patient expressed understanding and agreed to proceed.  Location patient: home Location provider: work o office Participants present for the call: patient, provider Patient did not have a visit in the prior 7 days to address this/these issue(s).   History of Present Illness: Sharon Wilson  For fu Sharon Wilson  comes in today for follow up of  multiple medical problems.  And med evaluation  FU delayed from covid  Pandemic.  DM: says  Metformin 3 per day doing well  fbs usually 110 range ocass 120 no se noted ocass diarrhea.   Bp has been good when not going to med appt  Today was 125/80 and 110/80 taking carvedilol 12.5 1.5 bid  No cv sx  And hsot down from covid husband not letting her out cause of hger high risk  A bit bored but well.   HLD on crestor  10 mg seems to be ok with this  Sleep 6-8 hours   No TAD Had strange episode of painless ear bleeding withtou hearing change  Sometimes itches  Not sure if a scratch feels fine with this.  No neuro sx and no falling    Observations/Objective: Patient sounds personable and well on the phone. Weight 118.5 I do not appreciate any SOB. Speech and thought processing are grossly intact. Patient reported vitals:  Assessment and Plan: Non-insulin dependent type 2 diabetes mellitus (Sharon Wilson) - Plan: Basic metabolic panel, CBC with Differential, Hemoglobin A1c, Hepatic function panel, Lipid panel, TSH, Microalbumin / creatinine urine ratio  Medication management  - Plan: Basic metabolic panel, CBC with Differential, Hemoglobin A1c, Hepatic function panel, Lipid panel, TSH, Microalbumin / creatinine urine ratio  Essential hypertension - Plan: Basic metabolic panel, CBC with Differential, Hemoglobin A1c, Hepatic function panel, Lipid panel, TSH, Microalbumin / creatinine urine ratio  White coat syndrome with diagnosis of hypertension - Plan: Basic metabolic panel, CBC with Differential, Hemoglobin A1c, Hepatic function panel, Lipid panel, TSH, Microalbumin / creatinine urine ratio  History of CVA in adulthood - Plan: Basic metabolic panel, CBC with Differential, Hemoglobin A1c, Hepatic function panel, Lipid panel, TSH, Microalbumin / creatinine urine ratio  Hyperlipidemia, unspecified hyperlipidemia type - Plan: Basic metabolic panel, CBC with Differential, Hemoglobin A1c, Hepatic function panel, Lipid panel, TSH, Microalbumin / creatinine urine ratio  Peripheral arterial disease (HCC) - Plan: Basic metabolic panel, CBC with Differential, Hemoglobin A1c, Hepatic function panel, Lipid panel, TSH, Microalbumin / creatinine urine ratio  Refill med at 90 days  Seems to be doing well no new neuro cv sx   Overdue for lab monitoring and foot exam  get appt  For soon   And  For flu vaccine  cpx in 3 mos otherwise   No change in meds at this time  Fu of bleeding from ear recurs or other concerns  Follow Up Instructions:   [id number] [id number] [id number] 21-30 I did not refer this patient for an OV in the next 24 hours for this/these issue(s).  I discussed the assessment and treatment plan with the  patient. The patient was provided an opportunity to ask questions and all were answered. The patient agreed with the plan and demonstrated an understanding of the instructions.   The patient was advised to call back or seek an in-person evaluation if the symptoms worsen or if the condition fails to improve as anticipated.  I provided 18  minutes of non-face-to-face  time during this encounter. Return for labs and flu vaccine now  cpx in3 mos.  Shanon Ace, MD

## 2019-10-07 ENCOUNTER — Ambulatory Visit: Payer: Medicare Other | Admitting: Family Medicine

## 2019-10-07 ENCOUNTER — Other Ambulatory Visit: Payer: Self-pay

## 2019-10-07 ENCOUNTER — Encounter: Payer: Self-pay | Admitting: Family Medicine

## 2019-10-07 VITALS — BP 140/70 | HR 71 | Temp 97.1°F | Resp 16 | Ht 61.75 in | Wt 121.6 lb

## 2019-10-07 DIAGNOSIS — H60502 Unspecified acute noninfective otitis externa, left ear: Secondary | ICD-10-CM | POA: Diagnosis not present

## 2019-10-07 DIAGNOSIS — H9222 Otorrhagia, left ear: Secondary | ICD-10-CM

## 2019-10-07 DIAGNOSIS — H6122 Impacted cerumen, left ear: Secondary | ICD-10-CM | POA: Diagnosis not present

## 2019-10-07 DIAGNOSIS — H9202 Otalgia, left ear: Secondary | ICD-10-CM | POA: Diagnosis not present

## 2019-10-07 MED ORDER — NEOMYCIN-POLYMYXIN-HC 3.5-10000-1 OT SOLN: 3.0000 [drp] | Freq: Four times a day (QID) | OTIC | 0 refills | Status: AC

## 2019-10-07 MED ORDER — NEOMYCIN-POLYMYXIN-HC 3.5-10000-1 OT SOLN: 3.0000 [drp] | Freq: Four times a day (QID) | OTIC | 0 refills | Status: DC

## 2019-10-07 NOTE — Patient Instructions (Signed)
A few things to remember from today's visit:   Bleeding from left ear  Impacted cerumen of left ear  Acute otitis externa of left ear, unspecified type - Plan: neomycin-polymyxin-hydrocortisone (CORTISPORIN) OTIC solution  Ear drum doe snot seem to be affected. Topical antibiotic to treat external otitis recommended.  If sudden hearing loss or recurrent bleeding or worsening pain please go to acute care facility.  I hope you feel better soon!  Please be sure medication list is accurate. If a new problem present, please set up appointment sooner than planned today.

## 2019-10-07 NOTE — Progress Notes (Signed)
ACUTE VISIT   HPI:  Chief Complaint  Patient presents with  . Otalgia    [name] is a 78 y.o. female, who is here today complaining of 4 days of left earache. Ear ache is intermittent, achy-like sensation, 4/10. Pain is exacerbated by pressing on left ear and with chewing. No alleviating factors identified. She denies recent URI or travel. Denies hearing loss or ear drainage. Negative for headache, fever, chills, nasal congestion, rhinorrhea, sore throat, cough, wheezing, or dyspnea.  She has taking OTC Tylenol. Before problem started she had an episode of left ear bleeding.  Negative for history of trauma. She uses Q-tips once in a while. She denies nose/gum bleeding, gross hematuria, or blood in the stool.  Problem seems to be getting worse.  Review of Systems  Constitutional: Negative for activity change, appetite change and fatigue.  HENT: Negative for facial swelling and mouth sores.   Eyes: Negative for discharge, redness and itching.  Gastrointestinal: Negative for nausea and vomiting.  Musculoskeletal: Negative for gait problem and myalgias.  Skin: Negative for rash.  Neurological: Negative for dizziness, syncope, facial asymmetry, weakness and numbness.  Hematological: Negative for adenopathy. Does not bruise/bleed easily.  Rest see pertinent positives and negatives per HPI.   Current Outpatient Medications on File Prior to Visit  Medication Sig Dispense Refill  . acetaminophen (TYLENOL) 325 MG tablet Take 2 tablets (650 mg total) by mouth every 4 (four) hours as needed for mild pain or moderate pain.    Marland Kitchen aspirin EC 81 MG tablet Take 1 tablet (81 mg total) by mouth daily.    . Calcium Carbonate-Vitamin D (OSCAL 500/200 D-3 PO) Take 1 tablet by mouth 2 (two) times daily.     . carvedilol (COREG) 12.5 MG tablet Take one and one-half tablets by mouth twice daily with meals. 270 tablet 0  . clopidogrel (PLAVIX) 75 MG tablet Take 1 tablet (75  mg total) by mouth daily. 90 tablet 2  . isosorbide mononitrate (IMDUR) 30 MG 24 hr tablet Take 0.5 tablets (15 mg total) by mouth daily. 45 tablet 2  . metFORMIN (GLUCOPHAGE) 500 MG tablet TAKE ONE TABLET BY MOUTH IN THE MORNING AND TWO TABS IN THE EVENING 270 tablet 1  . nitroGLYCERIN (NITROSTAT) 0.4 MG SL tablet Place 1 tablet (0.4 mg total) under the tongue every 5 (five) minutes x 3 doses as needed for chest pain. 25 tablet 12  . rosuvastatin (CRESTOR) 5 MG tablet Take 1 tablet by mouth daily as tolerated. 90 tablet 10   No current facility-administered medications on file prior to visit.      Past Medical History:  Diagnosis Date  . Abnormal echocardiogram    a.07/2016 Echo: EF 50-55%, basal and mid inferolateral AK.  . Agatston coronary artery calcium score greater than 400    a. 07/2016 Abnl Echo with basal and mid inferolateral AK, EF 50-55%; b. 09/01/2016 Ex MV: EF 46%, hypertensive response, no ST changes, apical thinning with very mild apical ischemia;  c. 09/21/2016 Cardiac CTA: Ca2+ score = 717, 90th percentile. Extensive CAD w/ mod stenosis in distal LM/ostial LAD, 50% RPLV-->rec Cath.  . Anemia    hysterectomy  . CAD (coronary artery disease)    a. 09/01/2016 Ex MV: EF 46%, hypertensive response, no ST changes, apical thinning with very mild apical ischemia;  c. 09/21/2016 Cardiac CTA: Ca2+ score = 717, 90th percentile. Extensive CAD w/ mod stenosis in distal LM/ostial LAD, 50% RPLV-->rec Cath  .  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 Diverticulosis   . Hyperlipidemia   . OA (osteoarthritis)   . Stroke (cerebrum) ([place])    a. 07/2016 L facial droop-->Head CT: small area of low attenuation in the right frontoparietal region c/w acute or subacute lacunar infarct; 07/2016 MRI/A brain: acute infarct of right centrum semiovale and possibly posterior limb of right internal capsule. Mild to moderate multifocal narrowing of  the MCA arteries w/o occlusion.  . Type II diabetes mellitus ([place])    a. 07/2016 A1c 7.3.  . White coat hypertension    Allergies  Allergen Reactions  . Ace Inhibitors Hives, Swelling and Cough  . Lovastatin Other (See Comments)    Leg cramps,  NO statins  . Norvasc [Amlodipine Besylate]     Light headed and vertigo like  Also may have had hives.     Social History   Socioeconomic History  . Marital status: Married    Spouse name: Not on file  . Number of children: Not on file  . Years of education: Not on file  . Highest education level: Not on file  Occupational History  . Not on file  Social Needs  . Financial resource strain: Not on file  . Food insecurity    Worry: Not on file    Inability: Not on file  . Transportation needs    Medical: Not on file    Non-medical: Not on file  Tobacco Use  . Smoking status: Never Smoker  . Smokeless tobacco: Never Used  Substance and Sexual Activity  . Alcohol use: No    Alcohol/week: 0.0 standard drinks  . Drug use: No  . Sexual activity: Not on file  Lifestyle  . Physical activity    Days per week: Not on file    Minutes per session: Not on file  . Stress: Not on file  Relationships  . Social Herbalist on phone: Not on file    Gets together: Not on file    Attends religious service: Not on file    Active member of club or organization: Not on file    Attends meetings of clubs or organizations: Not on file    Relationship status: Not on file  Other Topics Concern  . Not on file  Social History Narrative   Lives in [place] with husband.   Retired.   hh of 2   No pets    Vitals:   10/07/19 1104  BP: 140/70  Pulse: 71  Resp: 16  Temp: (!) 97.1 F (36.2 C)  SpO2: 98%   Body mass index is 22.42 kg/m.  Physical Exam  Nursing note and vitals reviewed. Constitutional: She is oriented to person, place, and time. She appears well-developed and well-nourished. No distress.  HENT:  Head: Normocephalic and  atraumatic.  Right Ear: Tympanic membrane, external ear and ear canal normal.  Left Ear: Hearing normal. There is tenderness. No drainage.  Mouth/Throat: Oropharynx is clear and moist and mucous membranes are normal.  Left ear tenderness upon pressing tragus and pulling helix.\Dry blood in ear canal mixed with cerumen. Gross hearing intact ,bilateral.   Eyes: Conjunctivae are normal.  Cardiovascular: Normal rate and regular rhythm.  Respiratory: Effort normal and breath sounds normal. No respiratory distress.  Musculoskeletal:     Comments: There is no tenderness upon palpation of left TMJ.  Lymphadenopathy:       Head (  right side): No submandibular, no preauricular and no posterior auricular adenopathy present.       Head (left side): No submandibular, no preauricular and no posterior auricular adenopathy present.    She has no cervical adenopathy.  Neurological: She is alert and oriented to person, place, and time. She has normal strength. No cranial nerve deficit.  Otherwise stable gait,not assisted.  Skin: Skin is warm. No erythema.  Psychiatric: She has a normal mood and affect.  Well groomed, good eye contact.    ASSESSMENT AND PLAN:  Ms. Shahla was seen today for otalgia.  Diagnoses and all orders for this visit:  Earache on left We discussed possible etiologies, including referred pain. Continue Tylenol 500 mg 3-4 times per day as needed for pain. If problem continues, ENT evaluation needs to be considered.  Acute otitis externa of left ear, unspecified type Difficult examination due to dried blood and cerumen in ear canal. Tenderness when using curette to remove cerumen. Will treat empirically for external otitis with topical antibiotics. She was clearly instructed about warning signs.  -     neomycin-polymyxin-hydrocortisone (CORTISPORIN) OTIC solution; Place 3 drops into the left ear 4 (four) times daily for 7 days.  Bleeding from left ear Resolved.  Impacted  cerumen of left ear Because earache we decided to hold on ear lavage. Tried to remove cerumen and dried blood from ear canal, because pain I did not complete procedure but able to see TM partially and no erythema. There is no pain with auto inflation maneuvers. 1 drop of oil may help to soften dry blood. Clearly instructed about warning signs.  She voices understanding and agrees with plan.   Return if symptoms worsen or fail to improve.   Betty G. Martinique, MD  [facility]. [place] office.

## 2019-10-19 ENCOUNTER — Other Ambulatory Visit: Payer: Self-pay | Admitting: Internal Medicine

## 2019-11-05 ENCOUNTER — Other Ambulatory Visit: Payer: Self-pay | Admitting: Internal Medicine

## 2019-11-18 ENCOUNTER — Telehealth: Payer: Self-pay | Admitting: Cardiology

## 2019-11-18 NOTE — Telephone Encounter (Addendum)
   Primary Cardiologist: Fransico Him, MD  Chart reviewed as part of pre-operative protocol coverage. 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 She was seen by Dr. Servando Snare at that time who opted to hold off on surgery given patient's recent stroke and the fact that she was asymptomatic. 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   Patient is scheduled to have debridement to clear out her ear. Per pre-op form, planning for general anaesthesia. She was last seen by Dr. Radford Pax almost a year ago in 12/2018 at which time she was doing well and denied any cardiac symptoms. This is a low-risk procedure. However, patient has not had an updated EKG since 04/2018. I called patient to discuss this. It sounds like she has been doing well from a heart standpoint. However, given her history of severe 3 vessel CAD with no prior revascularization and the fact that it is been about 1.5 years since she has had an EKG and almost 1 year since her last visit, I think it would be wise for patient to have an in office visit prior to surgery given that general anaesthesia is going to be used. At a very minimum, patient would need to come in for an updated EKG. Patient OK with coming in for an office visit. Dr. Radford Pax actually has some availability later this week, and I was able to schedule her a visit with Dr. Radford Pax on Friday on 11/22/2019 at 9:40am.   Pre-Op Covering Staff: - Can you please notify requesting surgeon's office of need for this appointment prior to procedure.   Thank you!  Darreld Mclean, PA-C 11/18/2019, 5:37 PM

## 2019-11-18 NOTE — Telephone Encounter (Signed)
Thank you! I will let them know to check with PCP as well. But from your standpoint, how long can she hold Plavix for? 5 days?

## 2019-11-18 NOTE — Telephone Encounter (Signed)
   [facility] Pre-operative Risk Assessment    Request for surgical clearance:  1. What type of surgery is being performed? Debridement to clean out her ear.  2. When is this surgery scheduled? TBD  3. What type of clearance is required (medical clearance vs. Pharmacy clearance to hold med vs. Both)? Pharmacy  4.) Are there any medications that need to be held prior to surgery and how long? Plavix, office needs to know how long they recommend her stop this medication before her procedure  5. Practice name and name of physician performing surgery? Dr. Constance Holster [facility]  6. What is your office phone number [phone]    7.   What is your office fax number [phone]  8.   Anesthesia type (None, local, MAC, general) ? General   Sharon Wilson 11/18/2019, 1:37 PM  _________________________________________________________________   (provider comments below)

## 2019-11-18 NOTE — Telephone Encounter (Signed)
5-7 days is fine

## 2019-11-18 NOTE — Telephone Encounter (Signed)
Hi Dr. Regis Bill,  Patient has upcoming debridement of ear planned and we were asked to comment on how long Plavix could be held for. I discussed with Dr. Radford Pax who is OK with holding Plavix for 5-7 days from a cardiac standpoint. However, given her history of stroke, we also wanted to check with you. Are you okay with her holding Plavix for this long from a stroke standpoint?  Please route response back to P CV DIV PREOP.   Thank you so much! Sande Rives

## 2019-11-18 NOTE — Telephone Encounter (Signed)
Hi Dr. Radford Pax. Can you please comment on your recommendations for holding Plavix for upcoming ear debridement? 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 She was seen by Dr. Servando Snare at that time who opted to hold off on surgery given patient's recent stroke and the fact that she was asymptomatic. 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 She was last seen by you almost a year ago in 12/2018 at which time she was doing well and denied any cardiac symptoms. However, she has not had an EKG since 04/2018. I tried calling her today to see how she was doing but got her voicemail. I will keep trying to reach her. If she is still doing well from a cardiac standpoint, are you OK if I clear her for this procedure. Or would you like her to come back in for updated EKG and office visit given her history.   Please route response back to P CV DIV PREOP.  Thank you!

## 2019-11-18 NOTE — Telephone Encounter (Signed)
From cardiac standpoint I am fine with her holding Plavix but need to check with PCP as well since she had a CVA and will need to get their ok

## 2019-11-18 NOTE — Telephone Encounter (Signed)
  Patient is returning call from Preop

## 2019-11-19 NOTE — Telephone Encounter (Signed)
I will forward clearance note to Dr. Radford Pax for pre op clearance. I will remove from the pre op call back pool.

## 2019-11-19 NOTE — Telephone Encounter (Signed)
Ok to hold  plavix for 5-7 days for procedure

## 2019-11-19 NOTE — Telephone Encounter (Signed)
Patient is scheduled for visit with Dr. Radford Pax on 11/21/2018 for updated EKG and pre-op eval. Will route request form to her so that she is aware. Discussed with PCP who is also OK with holding Plavix for 5-7 days from a stroke standpoint.   Will remove from pre-op pool.

## 2019-11-20 ENCOUNTER — Telehealth: Payer: Self-pay | Admitting: Cardiology

## 2019-11-20 NOTE — Telephone Encounter (Signed)
Follow up   Please call Dr. Janeice Rabago office , they did not receive the fax per the previous message about how long to withhold the plavix.

## 2019-11-20 NOTE — Telephone Encounter (Signed)
Called the requesting office of Dr. Constance Holster and spoke with Lattie Haw. I informed Lattie Haw that the patient is to hold Plavix for 5-7 days prior and that the patient is schedule to see her cardiologist on 11/22/19 to have a full pre-op assessment. I informed her that I will faz the medication clearance to their office. Lattie Haw thank me for calling and will be looking for the fax with the medication instructions for the patient.

## 2019-11-20 NOTE — Telephone Encounter (Signed)
   Primary Cardiologist: Fransico Him, MD  Patient has an appointment with Dr. Radford Pax scheduled for 11/22/2019 and pre-op assessment can be completed at that time.   I will route this recommendation to the requesting party via Epic fax function so that they are aware.   Please call with questions.  Darreld Mclean, PA-C 11/20/2019, 11:24 AM

## 2019-11-20 NOTE — Telephone Encounter (Signed)
I just tried calling the office but got a voicemail. Can you please try calling them again? OK to hold Plavix for 5-7 days but will you also let them know that patient has an appointment with Dr. Radford Pax scheduled for Friday 11/22/2019 for full pre-op assessment.   Thank you!

## 2019-11-20 NOTE — Telephone Encounter (Signed)
   [facility] Pre-operative Risk Assessment    Request for surgical clearance:  1. What type of surgery is being performed? Debridement and removal of cyst in ear canal   2. When is this surgery scheduled? 12-02-19   3. What type of clearance is required (medical clearance vs. Pharmacy clearance to hold med vs. Both)?  Both  4. Are there any medications that need to be held prior to surgery and how long? Plavix-   5. Practice name and name of physician performing surgery? Dr Constance Holster   6. What is your office phone number [phone] 7.   7.   What is your office fax number  [phone]  8.   Anesthesia type (None, local, MAC, general) ?  General or MAC  Sharon Wilson 11/20/2019, 11:03 AM  _________________________________________________________________   (provider comments below)

## 2019-11-22 ENCOUNTER — Other Ambulatory Visit: Payer: Self-pay

## 2019-11-22 ENCOUNTER — Encounter: Payer: Self-pay | Admitting: Cardiology

## 2019-11-22 ENCOUNTER — Ambulatory Visit (INDEPENDENT_AMBULATORY_CARE_PROVIDER_SITE_OTHER): Payer: Medicare Other | Admitting: Cardiology

## 2019-11-22 VITALS — BP 126/66 | HR 64 | Ht 61.0 in | Wt 112.0 lb

## 2019-11-22 DIAGNOSIS — E119 Type 2 diabetes mellitus without complications: Secondary | ICD-10-CM

## 2019-11-22 DIAGNOSIS — I2510 Atherosclerotic heart disease of native coronary artery without angina pectoris: Secondary | ICD-10-CM

## 2019-11-22 DIAGNOSIS — E7800 Pure hypercholesterolemia, unspecified: Secondary | ICD-10-CM

## 2019-11-22 DIAGNOSIS — I10 Essential (primary) hypertension: Secondary | ICD-10-CM

## 2019-11-22 DIAGNOSIS — I6523 Occlusion and stenosis of bilateral carotid arteries: Secondary | ICD-10-CM

## 2019-11-22 DIAGNOSIS — Z01818 Encounter for other preprocedural examination: Secondary | ICD-10-CM

## 2019-11-22 LAB — LIPID PANEL
Chol/HDL Ratio: 2.3 ratio (ref 0.0–4.4)
Cholesterol, Total: 108 mg/dL (ref 100–199)
HDL: 46 mg/dL (ref >39)
LDL Chol Calc (NIH): 41 mg/dL (ref 0–99)
Triglycerides: 120 mg/dL (ref 0–149)
VLDL Cholesterol Cal: 21 mg/dL (ref 5–40)

## 2019-11-22 LAB — ALT: ALT: 10 IU/L (ref 0–32)

## 2019-11-22 NOTE — H&P (Signed)
HPI:   Sharon Wilson is a 79 y.o. female who presents as a new Patient.   Referring Provider: Self, A Referral  Chief complaint: Bleeding ear.  HPI: 2 weeks ago she had some blood draining from her left ear in the middle of the night. No prior problems. She is on blood thinner with Plavix. Since then she has had intermittent mild bloody drainage and occasional pain but no hearing loss. She has been on Cortisporin drops recently. Attempts to clean the ear out were very painful.  PMH/Meds/All/SocHx/FamHx/ROS:   Past Medical History:  Diagnosis Date  . History of stroke   Past Surgical History:  Procedure Laterality Date  . NO PAST SURGERIES   No family history of bleeding disorders, wound healing problems or difficulty with anesthesia.   Social History   Socioeconomic History  . Marital status: Married  Spouse name: Not on file  . Number of children: Not on file  . Years of education: Not on file  . Highest education level: Not on file  Occupational History  . Not on file  Social Needs  . Financial resource strain: Not on file  . Food insecurity  Worry: Not on file  Inability: Not on file  . Transportation needs  Medical: Not on file  Non-medical: Not on file  Tobacco Use  . Smoking status: Never Smoker  . Smokeless tobacco: Never Used  Substance and Sexual Activity  . Alcohol use: Not Currently  . Drug use: Never  . Sexual activity: Not on file  Lifestyle  . Physical activity  Days per week: Not on file  Minutes per session: Not on file  . Stress: Not on file  Relationships  . Social Medical illustrator on phone: Not on file  Gets together: Not on file  Attends religious service: Not on file  Active member of club or organization: Not on file  Attends meetings of clubs or organizations: Not on file  Relationship status: Not on file  Other Topics Concern  . Not on file  Social History Narrative  . Not on file   Current Outpatient Medications:  .  calcium-vits D3-C-K2-minerals 166.75 mg- 166.75 unit Cap, Take by mouth., Disp: , Rfl:  . carvediloL (COREG) 12.5 MG tablet, Take one and one-half tablets by mouth twice daily with meals., Disp: , Rfl:  . clopidogreL (PLAVIX) 75 mg tablet *ANTIPLATELET*, Take 75 mg by mouth., Disp: , Rfl:  . isosorbide mononitrate ER (IMDUR ER) 30 MG 24 hr tablet, Take 15 mg by mouth., Disp: , Rfl:  . metFORMIN (GLUCOPHAGE) 500 MG tablet, TAKE ONE TABLET BY MOUTH IN THE MORNING AND TWO TABS IN THE EVENING, Disp: , Rfl:  . neomycin-polymyxin-hydrocortisone (CORTISPORIN) 3.5-10,000-1 mg/mL-unit/mL-% otic solution, INSTILL 3 DROPS INTO LEFT EAR 4 TIMES DAILY FOR 7 DAYS, Disp: , Rfl:  . rosuvastatin (CRESTOR) 5 MG tablet, Take 1 tablet by mouth daily as tolerated., Disp: , Rfl:   A complete ROS was performed with pertinent positives/negatives noted in the HPI. The remainder of the ROS are negative.   Physical Exam:   BP (!) 191/90  Pulse 75  Temp 96.9 F (36.1 C)  Ht 1.575 m (5\' 2" )  Wt 54.4 kg (120 lb)  BMI 21.95 kg/m   General: Healthy and alert, in no distress, breathing easily. Normal affect. In a pleasant mood. Head: Normocephalic, atraumatic. No masses, or scars. Eyes: Pupils are equal, and reactive to light. Vision is grossly intact. No spontaneous or gaze nystagmus. Ears: Ear canals  are clear on the right with complete obstruction on the left by dried up blood which was cleaned out under the microscope. Tympanic membranes are intact, with normal landmarks and the middle ears are clear and healthy. Hearing: Grossly normal. Nose: Nasal cavities are clear with healthy mucosa, no polyps or exudate. Airways are patent. Face: No masses or scars, facial nerve function is symmetric. Oral Cavity: No mucosal abnormalities are noted. Tongue with normal mobility. Dentition appears healthy. Oropharynx: Tonsils are symmetric. There are no mucosal masses identified. Tongue base appears normal and  healthy. Larynx/Hypopharynx: deferred Chest: Deferred Neck: No palpable masses, no cervical adenopathy, no thyroid nodules or enlargement. Neuro: Cranial nerves II-XII with normal function. Balance: Normal gate. Other findings: none.  Independent Review of Additional Tests or Records:  none  Procedures:  Procedure note:  Indications: External otitis  Details of ear canal cleaning were discussed with the patient and all questions were answered.  Procedure:  Using the operating microscope, the left side was cleaned of dried blood using suction, otologic forceps and otologic picks.   She tolerated this procedure well. There were no complications.  Impression & Plans:  Probable injury to the ear canal skin, possibly accidental by her fingernail in the middle of the night, with subsequent bleeding because of the Plavix treatment, and then eventually infection because of moisture trapped in the ear canal. Everything was cleaned out today. Recommend she use the Cortisporin drops for a couple more days and then stop. Keep all water out. If she is not back to normal by the end of next week follow-up here.

## 2019-11-22 NOTE — Patient Instructions (Addendum)
Medication Instructions:  Your provider recommends that you continue on your current medications as directed. Please refer to the Current Medication list given to you today.   *If you need a refill on your cardiac medications before your next appointment, please call your pharmacy*  Lab Work: TODAY: lipids, ALT If you have labs (blood work) drawn today and your tests are completely normal, you will receive your results only by: Marland Kitchen MyChart Message (if you have MyChart) OR . A paper copy in the mail If you have any lab test that is abnormal or we need to change your treatment, we will call you to review the results.  Testing/Procedures: Your physician has requested that you have a carotid duplex in September. This test is an ultrasound of the carotid arteries in your neck. It looks at blood flow through these arteries that supply the brain with blood. Allow one hour for this exam. There are no restrictions or special instructions.  Follow-Up: At [facility], you and your health needs are our priority.  As part of our continuing mission to provide you with exceptional heart care, we have created designated Provider Care Teams.  These Care Teams include your primary Cardiologist (physician) and Advanced Practice Providers (APPs -  Physician Assistants and Nurse Practitioners) who all work together to provide you with the care you need, when you need it. Your next appointment:   12 month(s) The format for your next appointment:   In Person Provider:   Fransico Him, MD   Other information: You are cleared to hold your Plavix for 2 days prior to your procedure.

## 2019-11-22 NOTE — Progress Notes (Signed)
Patient was a late add-on at 1612 for Monday 11-25-19 by Dr Constance Holster for tympanoplasty. Patient saw Dr Fransico Him cardiology today and was cleared for surgery. Chart was reviewed by Dr Rodman Comp. Attemped to call patient and had to leave a detailed message stating that she MUST go tomorrow 11-23-19 to [facility] for covid test. NPO after MN, take carvedilol and imdur DOS with sip water. Arrive at [employer].

## 2019-11-22 NOTE — Progress Notes (Signed)
Cardiology Office Note:    Date:  11/22/2019   ID:  SELENNE PESCHEL, DOB Dec 06, 1940, MRN [id number]  PCP:  [name], MD  Cardiologist:  Fransico Him, MD    Referring MD: [name], MD   Chief Complaint  Patient presents with  . Coronary Artery Disease  . Hypertension  . Hyperlipidemia    History of Present Illness:    Sharon Wilson is a 79 y.o. female with a hx of R brain CVA in 9/17, DM, HL, carotid artery disease and white coat HTN. Echo after her CVA demonstrated normal LVF butinferolateral AK prompting an ischemic workup. Nuclear stress test demonstrated apical thinning and very mild apical ischemia. Coronary CTA revealed a coronary calcium score of 717, placing her in the 90th percentile. She also had suggestion of distal left main/ostial LAD plaquing of less than 50% and an area in the RPLV that was approximately 50% as well. She underwent cath showing severe 3 vessel ASCAD.  She was seen by Dr. Servando Snare on 10/03/16 and giventhe patient's relatively recent stroke, lack of symptoms and negative stress test, he has opted to hold off on surgery, as her risk of recurrent stroke would be significantly greater. She was seen again in f/u by Dr. Earl Lagos on 10/27/16 and overall consensus wasto continue to treat medically, for now, given her lack of symptoms.  She is here today for followup and is doing well from a cardiac standpoint but is having problems with an ear infection.  She developed a left ear infection after Thanksgiving and has been on several antibx with no improvement.  The pain has been so bad that it has affected her teeth and she has lost 14lbs from not eating.  She is going to have surgery on her ear on Monday by Dr. Constance Holster and needed preop clearance.  She says that it will be twilight surgery.  She denies any chest pain or pressure, SOB, DOE, PND, orthopnea, LE edema, dizziness, palpitations or syncope. She is able to walk 2 blocks or go up 1-2  flights of stairs without any CP or SOB.  She is compliant with her meds and is tolerating meds with no SE.    Past Medical History:  Diagnosis Date  . Abnormal echocardiogram    a.07/2016 Echo: EF 50-55%, basal and mid inferolateral AK.  . Agatston coronary artery calcium score greater than 400    a. 07/2016 Abnl Echo with basal and mid inferolateral AK, EF 50-55%; b. 09/01/2016 Ex MV: EF 46%, hypertensive response, no ST changes, apical thinning with very mild apical ischemia;  c. 09/21/2016 Cardiac CTA: Ca2+ score = 717, 90th percentile. Extensive CAD w/ mod stenosis in distal LM/ostial LAD, 50% RPLV-->rec Cath.  . Anemia    hysterectomy  . CAD (coronary artery disease)    a. 09/01/2016 Ex MV: EF 46%, hypertensive response, no ST changes, apical thinning with very mild apical ischemia;  c. 09/21/2016 Cardiac CTA: Ca2+ score = 717, 90th percentile. Extensive CAD w/ mod stenosis in distal LM/ostial LAD, 50% RPLV-->rec Cath  . 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 Diverticulosis   . Hyperlipidemia   . OA (osteoarthritis)   . Stroke (cerebrum) ([place])    a. 07/2016 L facial droop-->Head CT: small area of low attenuation in the right frontoparietal region c/w acute or subacute lacunar infarct; 07/2016 MRI/A brain: acute infarct of right centrum semiovale and possibly posterior  limb of right internal capsule. Mild to moderate multifocal narrowing of the MCA arteries w/o occlusion.  . Type II diabetes mellitus ([place])    a. 07/2016 A1c 7.3.  . White coat hypertension     Past Surgical History:  Procedure Laterality Date  . ABDOMINAL HYSTERECTOMY     fibroid bleeding  . CARDIAC CATHETERIZATION N/A 09/23/2016   Procedure: Left Heart Cath and Coronary Angiography;  Surgeon: Nelva Bush, MD;  Location: [facility];  Service: Cardiovascular;  Laterality: N/A;  . CARDIAC CATHETERIZATION N/A 09/23/2016   Procedure: Intravascular  Pressure Wire/FFR Study;  Surgeon: Nelva Bush, MD;  Location: [facility];  Service: Cardiovascular;  Laterality: N/A;  . carotid dopplers  11/11   per [facility] clinic  . CATARACT EXTRACTION  2011    Current Medications: Current Meds  Medication Sig  . acetaminophen (TYLENOL) 325 MG tablet Take 2 tablets (650 mg total) by mouth every 4 (four) hours as needed for mild pain or moderate pain.  Marland Kitchen aspirin EC 81 MG tablet Take 1 tablet (81 mg total) by mouth daily.  . Calcium Carbonate-Vitamin D (OSCAL 500/200 D-3 PO) Take 1 tablet by mouth 2 (two) times daily.   . carvedilol (COREG) 12.5 MG tablet Take one and one-half tablets by mouth twice daily with meals.  . clopidogrel (PLAVIX) 75 MG tablet Take 1 tablet (75 mg total) by mouth daily.  . isosorbide mononitrate (IMDUR) 30 MG 24 hr tablet Take 0.5 tablets (15 mg total) by mouth daily.  . metFORMIN (GLUCOPHAGE) 500 MG tablet TAKE ONE TABLET BY MOUTH IN THE MORNING AND TAKE TWO TABLETS IN THE EVENING  . nitroGLYCERIN (NITROSTAT) 0.4 MG SL tablet Place 1 tablet (0.4 mg total) under the tongue every 5 (five) minutes x 3 doses as needed for chest pain.  . rosuvastatin (CRESTOR) 5 MG tablet Take 1 tablet by mouth daily as tolerated.     Allergies:   Ace inhibitors, Amlodipine besylate, and Lovastatin   Social History   Socioeconomic History  . Marital status: Married    Spouse name: Not on file  . Number of children: Not on file  . Years of education: Not on file  . Highest education level: Not on file  Occupational History  . Not on file  Tobacco Use  . Smoking status: Never Smoker  . Smokeless tobacco: Never Used  Substance and Sexual Activity  . Alcohol use: No    Alcohol/week: 0.0 standard drinks  . Drug use: No  . Sexual activity: Not on file  Other Topics Concern  . Not on file  Social History Narrative   Lives in [place] with husband.   Retired.   hh of 2   No pets   Social Determinants of Health   Financial Resource  Strain:   . Difficulty of Paying Living Expenses: Not on file  Food Insecurity:   . Worried About Charity fundraiser in the Last Year: Not on file  . Ran Out of Food in the Last Year: Not on file  Transportation Needs:   . Lack of Transportation (Medical): Not on file  . Lack of Transportation (Non-Medical): Not on file  Physical Activity:   . Days of Exercise per Week: Not on file  . Minutes of Exercise per Session: Not on file  Stress:   . Feeling of Stress : Not on file  Social Connections:   . Frequency of Communication with Friends and Family: Not on file  .  Frequency of Social Gatherings with Friends and Family: Not on file  . Attends Religious Services: Not on file  . Active Member of Clubs or Organizations: Not on file  . Attends Archivist Meetings: Not on file  . Marital Status: Not on file     Family History: The patient's family history includes Breast cancer in her mother; Cancer in her mother. There is no history of CAD.  ROS:   Please see the history of present illness.    ROS  All other systems reviewed and negative.   EKGs/Labs/Other Studies Reviewed:    The following studies were reviewed today: EKG, carotid dopplers, Labs  EKG:  EKG is  ordered today.  The ekg ordered today demonstrates NSR at 64bpm with no ST changes  Recent Labs: No results found for requested labs within last 8760 hours.   Recent Lipid Panel    Component Value Date/Time   CHOL 126 11/16/2018 0903   CHOL 102 08/08/2018 0845   TRIG 119.0 11/16/2018 0903   HDL 45.10 11/16/2018 0903   HDL 34 (L) 08/08/2018 0845   CHOLHDL 3 11/16/2018 0903   VLDL 23.8 11/16/2018 0903   LDLCALC 57 11/16/2018 0903   LDLCALC 48 08/08/2018 0845   LDLDIRECT 136.0 05/05/2016 0816    Physical Exam:    VS:  BP 126/66   Pulse 64   Ht 5\' 1"  (1.549 m)   Wt 112 lb (50.8 kg)   SpO2 99%   BMI 21.16 kg/m     Wt Readings from Last 3 Encounters:  11/22/19 112 lb (50.8 kg)  10/07/19 121 lb  9.6 oz (55.2 kg)  12/25/18 121 lb 6.4 oz (55.1 kg)     GEN:  Well nourished, well developed in no acute distress HEENT: Normal NECK: No JVD; No carotid bruits LYMPHATICS: No lymphadenopathy CARDIAC: RRR, no murmurs, rubs, gallops RESPIRATORY:  Clear to auscultation without rales, wheezing or rhonchi  ABDOMEN: Soft, non-tender, non-distended MUSCULOSKELETAL:  No edema; No deformity  SKIN: Warm and dry NEUROLOGIC:  Alert and oriented x 3 PSYCHIATRIC:  Normal affect   ASSESSMENT:    1. Coronary artery disease involving native coronary artery of native heart without angina pectoris   2. Essential hypertension   3. Pure hypercholesterolemia   4. Carotid stenosis, asymptomatic, bilateral   5. Preoperative clearance   6. DM type 2, goal HbA1c < 7% (HCC)    PLAN:    In order of problems listed above:  1.  ASCAD -severe 3v CAD -medical management recommended by CVTS given CVA at the time of cath in 2017 and lack of sx -she has not had any anginal sx and is able to complete at least 4.5 mets without any cardiac sx.  -continue on medical therapy -continue ASA 81mg  daily, Plavix 75mg  daily, BB, long acting nitrate and statin  2.  HTN -BP controlled -continue Carvedilol 18.75mg  BID  3.  HLD -LDL goal < 70 -LDL was 57 a year ago -check FLp and ALT -continue Crestor 5mg  daily  4.  Carotid artery stenosis -dopplers 07/2019 40-59% bilateral stenosis -repeat study 07/2020 -continue ASA and statin  5. Preop cardiac clearance for Ear infection -she is able to complete > 4.5 mets of activity with no anginal sx -her Revised cardiac risk index is moderate with a 6.6% perioperative risk of major cardiac event -she will proceed with ear surgery on Monday with planned conscious sedation -ok to hold Plavix and ASA 2 days prior to surgery  6.  DM2 -followed by PCP -continue metformin   Medication Adjustments/Labs and Tests Ordered: Current medicines are reviewed at length with the  patient today.  Concerns regarding medicines are outlined above.  Orders Placed This Encounter  Procedures  . Lipid panel  . ALT  . EKG 12-Lead   No orders of the defined types were placed in this encounter.   Signed, Fransico Him, MD  11/22/2019 10:09 AM    [place]

## 2019-11-23 ENCOUNTER — Other Ambulatory Visit (HOSPITAL_COMMUNITY)
Admission: RE | Admit: 2019-11-23 | Discharge: 2019-11-23 | Disposition: A | Discharge status: Home / Self Care | Payer: Medicare Other | Source: Ambulatory Visit | Attending: Otolaryngology | Admitting: Otolaryngology

## 2019-11-23 DIAGNOSIS — Z20822 Contact with and (suspected) exposure to covid-19: Secondary | ICD-10-CM | POA: Diagnosis not present

## 2019-11-23 DIAGNOSIS — Z01812 Encounter for preprocedural laboratory examination: Secondary | ICD-10-CM | POA: Diagnosis present

## 2019-11-23 LAB — SARS CORONAVIRUS 2 (TAT 6-24 HRS): SARS Coronavirus 2: NEGATIVE

## 2019-11-25 ENCOUNTER — Ambulatory Visit (HOSPITAL_BASED_OUTPATIENT_CLINIC_OR_DEPARTMENT_OTHER)
Admission: RE | Admit: 2019-11-25 | Discharge: 2019-11-25 | Disposition: A | Discharge status: Home / Self Care | Payer: Medicare Other | Attending: Otolaryngology | Admitting: Otolaryngology

## 2019-11-25 ENCOUNTER — Encounter (HOSPITAL_BASED_OUTPATIENT_CLINIC_OR_DEPARTMENT_OTHER)
Admission: RE | Disposition: A | Discharge status: Home / Self Care | Payer: Self-pay | Source: Home / Self Care | Attending: Otolaryngology

## 2019-11-25 ENCOUNTER — Ambulatory Visit (HOSPITAL_BASED_OUTPATIENT_CLINIC_OR_DEPARTMENT_OTHER): Payer: Medicare Other | Admitting: Anesthesiology

## 2019-11-25 ENCOUNTER — Encounter (HOSPITAL_BASED_OUTPATIENT_CLINIC_OR_DEPARTMENT_OTHER): Payer: Self-pay | Admitting: Otolaryngology

## 2019-11-25 DIAGNOSIS — Z79899 Other long term (current) drug therapy: Secondary | ICD-10-CM | POA: Insufficient documentation

## 2019-11-25 DIAGNOSIS — I10 Essential (primary) hypertension: Secondary | ICD-10-CM | POA: Insufficient documentation

## 2019-11-25 DIAGNOSIS — Z7902 Long term (current) use of antithrombotics/antiplatelets: Secondary | ICD-10-CM | POA: Diagnosis not present

## 2019-11-25 DIAGNOSIS — I2510 Atherosclerotic heart disease of native coronary artery without angina pectoris: Secondary | ICD-10-CM | POA: Insufficient documentation

## 2019-11-25 DIAGNOSIS — Z8673 Personal history of transient ischemic attack (TIA), and cerebral infarction without residual deficits: Secondary | ICD-10-CM | POA: Diagnosis not present

## 2019-11-25 DIAGNOSIS — E119 Type 2 diabetes mellitus without complications: Secondary | ICD-10-CM | POA: Insufficient documentation

## 2019-11-25 DIAGNOSIS — M1990 Unspecified osteoarthritis, unspecified site: Secondary | ICD-10-CM | POA: Insufficient documentation

## 2019-11-25 DIAGNOSIS — Z7984 Long term (current) use of oral hypoglycemic drugs: Secondary | ICD-10-CM | POA: Insufficient documentation

## 2019-11-25 DIAGNOSIS — I739 Peripheral vascular disease, unspecified: Secondary | ICD-10-CM | POA: Insufficient documentation

## 2019-11-25 DIAGNOSIS — H61892 Other specified disorders of left external ear: Secondary | ICD-10-CM | POA: Insufficient documentation

## 2019-11-25 HISTORY — PX: EAR CYST EXCISION: SHX22

## 2019-11-25 LAB — BASIC METABOLIC PANEL
Anion gap: 10 (ref 5–15)
BUN: 13 mg/dL (ref 8–23)
CO2: 27 mmol/L (ref 22–32)
Calcium: 8.9 mg/dL (ref 8.9–10.3)
Chloride: 104 mmol/L (ref 98–111)
Creatinine, Ser: 0.99 mg/dL (ref 0.44–1.00)
GFR calc Af Amer: >60 mL/min (ref >60)
GFR calc non Af Amer: 54 mL/min — ABNORMAL LOW (ref >60)
Glucose, Bld: 147 mg/dL — ABNORMAL HIGH (ref 70–99)
Potassium: 3.8 mmol/L (ref 3.5–5.1)
Sodium: 141 mmol/L (ref 135–145)

## 2019-11-25 LAB — GLUCOSE, CAPILLARY
Glucose-Capillary: 139 mg/dL — ABNORMAL HIGH (ref 70–99)
Glucose-Capillary: 112 mg/dL — ABNORMAL HIGH (ref 70–99)

## 2019-11-25 SURGERY — EXCISION, CYST, EAR
Anesthesia: General | Site: Ear | Laterality: Left

## 2019-11-25 MED ORDER — FENTANYL CITRATE (PF) 250 MCG/5ML IJ SOLN
INTRAMUSCULAR | Status: DC | PRN
  Administered 2019-11-25: 50 ug via INTRAVENOUS

## 2019-11-25 MED ORDER — ONDANSETRON HCL 4 MG/2ML IJ SOLN
INTRAMUSCULAR | Status: AC
  Filled 2019-11-25: qty 2

## 2019-11-25 MED ORDER — PROPOFOL 10 MG/ML IV BOLUS
INTRAVENOUS | Status: DC | PRN
  Administered 2019-11-25: 90 mg via INTRAVENOUS
  Administered 2019-11-25: 10 mg via INTRAVENOUS
  Administered 2019-11-25: 30 mg via INTRAVENOUS

## 2019-11-25 MED ORDER — LIDOCAINE 2% (20 MG/ML) 5 ML SYRINGE
INTRAMUSCULAR | Status: DC | PRN
  Administered 2019-11-25: 60 mg via INTRAVENOUS

## 2019-11-25 MED ORDER — HYDROMORPHONE HCL 1 MG/ML IJ SOLN
INTRAMUSCULAR | Status: AC
  Filled 2019-11-25: qty 0.5

## 2019-11-25 MED ORDER — OXYCODONE HCL 5 MG/5ML PO SOLN: 5.0000 mg | Freq: Once | ORAL | Status: DC | PRN

## 2019-11-25 MED ORDER — OXYCODONE HCL 5 MG PO TABS: 5.0000 mg | ORAL_TABLET | Freq: Once | ORAL | Status: DC | PRN

## 2019-11-25 MED ORDER — HYDRALAZINE HCL 20 MG/ML IJ SOLN
5.0000 mg | INTRAMUSCULAR | Status: AC | PRN
  Administered 2019-11-25 (×2): 5 mg via INTRAVENOUS

## 2019-11-25 MED ORDER — FENTANYL CITRATE (PF) 100 MCG/2ML IJ SOLN
INTRAMUSCULAR | Status: AC
  Filled 2019-11-25: qty 2

## 2019-11-25 MED ORDER — BACITRACIN ZINC 500 UNIT/GM EX OINT
TOPICAL_OINTMENT | CUTANEOUS | Status: DC | PRN
  Administered 2019-11-25: 1 via TOPICAL

## 2019-11-25 MED ORDER — HYDROCODONE-ACETAMINOPHEN 7.5-325 MG PO TABS: 1.0000 | ORAL_TABLET | Freq: Four times a day (QID) | ORAL | 0 refills | Status: DC | PRN

## 2019-11-25 MED ORDER — CIPROFLOXACIN-DEXAMETHASONE 0.3-0.1 % OT SUSP
OTIC | Status: AC
  Filled 2019-11-25: qty 7.5

## 2019-11-25 MED ORDER — CIPROFLOXACIN HCL 500 MG PO TABS: 500.0000 mg | ORAL_TABLET | Freq: Two times a day (BID) | ORAL | 0 refills | Status: AC

## 2019-11-25 MED ORDER — LIDOCAINE 2% (20 MG/ML) 5 ML SYRINGE
INTRAMUSCULAR | Status: AC
  Filled 2019-11-25: qty 5

## 2019-11-25 MED ORDER — FENTANYL CITRATE (PF) 100 MCG/2ML IJ SOLN
25.0000 ug | INTRAMUSCULAR | Status: DC | PRN
  Administered 2019-11-25 (×2): 50 ug via INTRAVENOUS

## 2019-11-25 MED ORDER — LIDOCAINE-EPINEPHRINE 1 %-1:100000 IJ SOLN
INTRAMUSCULAR | Status: DC | PRN
  Administered 2019-11-25: 2 mL

## 2019-11-25 MED ORDER — NEOMYCIN-POLYMYXIN-HC 3.5-10000-1 OT SUSP: 3.0000 [drp] | Freq: Every day | OTIC | 1 refills | Status: DC

## 2019-11-25 MED ORDER — KETOROLAC TROMETHAMINE 30 MG/ML IJ SOLN
INTRAMUSCULAR | Status: AC
  Filled 2019-11-25: qty 1

## 2019-11-25 MED ORDER — HYDRALAZINE HCL 20 MG/ML IJ SOLN
INTRAMUSCULAR | Status: AC
  Filled 2019-11-25: qty 1

## 2019-11-25 MED ORDER — METHYLENE BLUE 0.5 % INJ SOLN
INTRAVENOUS | Status: AC
  Filled 2019-11-25: qty 10

## 2019-11-25 MED ORDER — BACITRACIN ZINC 500 UNIT/GM EX OINT
TOPICAL_OINTMENT | CUTANEOUS | Status: AC
  Filled 2019-11-25: qty 28.35

## 2019-11-25 MED ORDER — EPINEPHRINE PF 1 MG/ML IJ SOLN
INTRAMUSCULAR | Status: AC
  Filled 2019-11-25: qty 5

## 2019-11-25 MED ORDER — PROPOFOL 10 MG/ML IV BOLUS
INTRAVENOUS | Status: AC
  Filled 2019-11-25: qty 20

## 2019-11-25 MED ORDER — DEXAMETHASONE SODIUM PHOSPHATE 10 MG/ML IJ SOLN
INTRAMUSCULAR | Status: DC | PRN
  Administered 2019-11-25: 4 mg via INTRAVENOUS

## 2019-11-25 MED ORDER — PROMETHAZINE HCL 25 MG/ML IJ SOLN: 6.2500 mg | INTRAMUSCULAR | Status: DC | PRN

## 2019-11-25 MED ORDER — LACTATED RINGERS IV SOLN: INTRAVENOUS | Status: DC

## 2019-11-25 MED ORDER — ACETAMINOPHEN 500 MG PO TABS: 1000.0000 mg | ORAL_TABLET | Freq: Once | ORAL | Status: DC

## 2019-11-25 MED ORDER — ONDANSETRON HCL 4 MG/2ML IJ SOLN
INTRAMUSCULAR | Status: DC | PRN
  Administered 2019-11-25: 4 mg via INTRAVENOUS

## 2019-11-25 MED ORDER — PROMETHAZINE HCL 25 MG RE SUPP: 25.0000 mg | Freq: Four times a day (QID) | RECTAL | 1 refills | Status: DC | PRN

## 2019-11-25 SURGICAL SUPPLY — 74 items
ADH SKN CLS APL DERMABOND .7 (GAUZE/BANDAGES/DRESSINGS) ×2
APL SKNCLS STERI-STRIP NONHPOA (GAUZE/BANDAGES/DRESSINGS)
BALL CTTN LRG ABS STRL LF (GAUZE/BANDAGES/DRESSINGS) ×2
BENZOIN TINCTURE PRP APPL 2/3 (GAUZE/BANDAGES/DRESSINGS) IMPLANT
BLADE CLIPPER SURG (BLADE) IMPLANT
BLADE NDL 3 SS STRL (BLADE) IMPLANT
BLADE NEEDLE 3 SS STRL (BLADE) IMPLANT
BLADE NEEDLE 3MM SS STRL (BLADE)
BNDG CONFORM 3 STRL LF (GAUZE/BANDAGES/DRESSINGS) IMPLANT
BNDG GAUZE ELAST 4 BULKY (GAUZE/BANDAGES/DRESSINGS) IMPLANT
BUR RND 2.0 SOFT (BURR) ×3 IMPLANT
CANISTER SUCT 1200ML W/VALVE (MISCELLANEOUS) ×4 IMPLANT
CLEANER CAUTERY TIP 5X5 PAD (MISCELLANEOUS) ×2 IMPLANT
CLOSURE WOUND 1/2 X4 (GAUZE/BANDAGES/DRESSINGS)
COTTONBALL LRG STERILE PKG (GAUZE/BANDAGES/DRESSINGS) ×4 IMPLANT
COVER WAND RF STERILE (DRAPES) IMPLANT
DECANTER SPIKE VIAL GLASS SM (MISCELLANEOUS) ×1 IMPLANT
DERMABOND ADVANCED (GAUZE/BANDAGES/DRESSINGS) ×2
DERMABOND ADVANCED .7 DNX12 (GAUZE/BANDAGES/DRESSINGS) ×2 IMPLANT
DRAPE EENT ADH APERT 31X51 STR (DRAPES) IMPLANT
DRAPE HALF SHEET 70X43 (DRAPES) IMPLANT
DRAPE INCISE 23X17 IOBAN STRL (DRAPES)
DRAPE INCISE 23X17 STRL (DRAPES) IMPLANT
DRAPE INCISE IOBAN 23X17 STRL (DRAPES) IMPLANT
DRAPE MICROSCOPE URBAN (DRAPES) ×4 IMPLANT
DRAPE MICROSCOPE WILD 40.5X102 (DRAPES) IMPLANT
DROPPER MEDICINE STER 1.5ML LF (MISCELLANEOUS) IMPLANT
DRSG GLASSCOCK MASTOID ADT (GAUZE/BANDAGES/DRESSINGS) IMPLANT
DRSG GLASSCOCK MASTOID PED (GAUZE/BANDAGES/DRESSINGS) IMPLANT
DRSG TELFA 3X8 NADH (GAUZE/BANDAGES/DRESSINGS) IMPLANT
ELECT COATED BLADE 2.86 ST (ELECTRODE) ×4 IMPLANT
ELECT REM PT RETURN 9FT ADLT (ELECTROSURGICAL) ×4
ELECTRODE REM PT RTRN 9FT ADLT (ELECTROSURGICAL) ×2 IMPLANT
GAUZE 4X4 16PLY RFD (DISPOSABLE) IMPLANT
GAUZE PACKING IODOFORM 1/4X5 (PACKING) ×3 IMPLANT
GAUZE SPONGE 4X4 12PLY STRL (GAUZE/BANDAGES/DRESSINGS) IMPLANT
GAUZE SPONGE 4X4 12PLY STRL LF (GAUZE/BANDAGES/DRESSINGS) IMPLANT
GLOVE BIO SURGEON STRL SZ7 (GLOVE) ×3 IMPLANT
GLOVE BIOGEL PI IND STRL 7.0 (GLOVE) ×1 IMPLANT
GLOVE BIOGEL PI IND STRL 7.5 (GLOVE) ×1 IMPLANT
GLOVE BIOGEL PI INDICATOR 7.0 (GLOVE) ×2
GLOVE BIOGEL PI INDICATOR 7.5 (GLOVE) ×2
GLOVE ECLIPSE 7.5 STRL STRAW (GLOVE) ×4 IMPLANT
GOWN STRL REUS W/ TWL LRG LVL3 (GOWN DISPOSABLE) ×1 IMPLANT
GOWN STRL REUS W/ TWL XL LVL3 (GOWN DISPOSABLE) ×3 IMPLANT
GOWN STRL REUS W/TWL LRG LVL3 (GOWN DISPOSABLE)
GOWN STRL REUS W/TWL XL LVL3 (GOWN DISPOSABLE) ×8
IV CATH AUTO 14GX1.75 SAFE ORG (IV SOLUTION) IMPLANT
IV SET EXT 30 76VOL 4 MALE LL (IV SETS) ×4 IMPLANT
NDL PRECISIONGLIDE 27X1.5 (NEEDLE) ×1 IMPLANT
NDL SAFETY ECLIPSE 18X1.5 (NEEDLE) ×2 IMPLANT
NEEDLE HYPO 18GX1.5 SHARP (NEEDLE) ×4
NEEDLE PRECISIONGLIDE 27X1.5 (NEEDLE) ×4 IMPLANT
NS IRRIG 1000ML POUR BTL (IV SOLUTION) ×4 IMPLANT
PACK BASIN DAY SURGERY FS (CUSTOM PROCEDURE TRAY) ×4 IMPLANT
PACK ENT DAY SURGERY (CUSTOM PROCEDURE TRAY) ×4 IMPLANT
PAD CLEANER CAUTERY TIP 5X5 (MISCELLANEOUS) ×2
PAD DRESSING TELFA 3X8 NADH (GAUZE/BANDAGES/DRESSINGS) IMPLANT
PENCIL FOOT CONTROL (ELECTRODE) ×4 IMPLANT
SLEEVE SCD COMPRESS KNEE MED (MISCELLANEOUS) ×3 IMPLANT
SPONGE SURGIFOAM ABS GEL 12-7 (HEMOSTASIS) ×4 IMPLANT
STRIP CLOSURE SKIN 1/2X4 (GAUZE/BANDAGES/DRESSINGS) IMPLANT
SUT CHROMIC 3 0 PS 2 (SUTURE) IMPLANT
SUT CHROMIC 4 0 P 3 18 (SUTURE) IMPLANT
SUT CHROMIC 4 0 PS 2 18 (SUTURE) IMPLANT
SUT ETHILON 5 0 P 3 18 (SUTURE)
SUT NYLON ETHILON 5-0 P-3 1X18 (SUTURE) IMPLANT
SUT PLAIN 5 0 P 3 18 (SUTURE) IMPLANT
SUT VIC AB 3-0 FS2 27 (SUTURE) IMPLANT
SYR 5ML LL (SYRINGE) IMPLANT
SYR BULB 3OZ (MISCELLANEOUS) IMPLANT
TOWEL GREEN STERILE FF (TOWEL DISPOSABLE) ×4 IMPLANT
TRAY DSU PREP LF (CUSTOM PROCEDURE TRAY) ×4 IMPLANT
TUBING IRRIGATION (MISCELLANEOUS) IMPLANT

## 2019-11-25 NOTE — Anesthesia Postprocedure Evaluation (Signed)
Anesthesia Post Note  Patient: Sharon Wilson  Procedure(s) Performed: EXCISION LEFT EAR CANAL MASS (Left Ear)     Patient location during evaluation: PACU Anesthesia Type: General Level of consciousness: awake and alert and oriented Pain management: pain level controlled Vital Signs Assessment: post-procedure vital signs reviewed and stable Respiratory status: spontaneous breathing, nonlabored ventilation and respiratory function stable Cardiovascular status: blood pressure returned to baseline Postop Assessment: no apparent nausea or vomiting Anesthetic complications: no    Last Vitals:  Vitals:   11/25/19 1300 11/25/19 1315  BP: (!) 178/70 (!) 194/85  Pulse: 65 72  Resp: 10 (!) 21  Temp: (!) 36.4 C   SpO2: 100% 100%    Last Pain:  Vitals:   11/25/19 1315  TempSrc:   PainSc: [place]

## 2019-11-25 NOTE — Discharge Instructions (Signed)
Replace the cottonball with antibiotic ointment on it as often as necessary.  Be careful not to pull the packing out behind it.  Once each day take the cottonball out and put a couple of drops of the eardrops into the ear.  Please start the oral antibiotic today (Cipro)  The pain medicine into the antinausea medicine is only if necessary, you do not have to fill those.  You may start using any of the eardrops that you already have but if you run out or need more I sent a new prescription in.   Post Anesthesia Home Care Instructions  Activity: Get plenty of rest for the remainder of the day. A responsible individual must stay with you for 24 hours following the procedure.  For the next 24 hours, DO NOT: -Drive a car -Paediatric nurse -Drink alcoholic beverages -Take any medication unless instructed by your physician -Make any legal decisions or sign important papers.  Meals: Start with liquid foods such as gelatin or soup. Progress to regular foods as tolerated. Avoid greasy, spicy, heavy foods. If nausea and/or vomiting occur, drink only clear liquids until the nausea and/or vomiting subsides. Call your physician if vomiting continues.  Special Instructions/Symptoms: Your throat may feel dry or sore from the anesthesia or the breathing tube placed in your throat during surgery. If this causes discomfort, gargle with warm salt water. The discomfort should disappear within 24 hours.  If you had a scopolamine patch placed behind your ear for the management of post- operative nausea and/or vomiting:  1. The medication in the patch is effective for 72 hours, after which it should be removed.  Wrap patch in a tissue and discard in the trash. Wash hands thoroughly with soap and water. 2. You may remove the patch earlier than 72 hours if you experience unpleasant side effects which may include dry mouth, dizziness or visual disturbances. 3. Avoid touching the patch. Wash your hands with soap  and water after contact with the patch.

## 2019-11-25 NOTE — Anesthesia Procedure Notes (Signed)
Procedure Name: LMA Insertion Date/Time: 11/25/2019 11:59 AM Performed by: Myna Bright, CRNA Pre-anesthesia Checklist: Patient identified, Emergency Drugs available, Suction available and Patient being monitored Patient Re-evaluated:Patient Re-evaluated prior to induction Oxygen Delivery Method: Circle system utilized Preoxygenation: Pre-oxygenation with 100% oxygen Induction Type: IV induction Ventilation: Mask ventilation without difficulty LMA: LMA inserted LMA Size: 4.0 Number of attempts: 1 Placement Confirmation: positive ETCO2 and breath sounds checked- equal and bilateral Tube secured with: Tape Dental Injury: Teeth and Oropharynx as per pre-operative assessment

## 2019-11-25 NOTE — Transfer of Care (Signed)
Immediate Anesthesia Transfer of Care Note  Patient: Sharon Wilson  Procedure(s) Performed: EXCISION LEFT EAR CANAL MASS (Left Ear)  Patient Location: PACU  Anesthesia Type:General  Level of Consciousness: awake, alert , oriented and patient cooperative  Airway & Oxygen Therapy: Patient Spontanous Breathing and Patient connected to nasal cannula oxygen  Post-op Assessment: Report given to RN, Post -op Vital signs reviewed and stable and Patient moving all extremities  Post vital signs: Reviewed and stable  Last Vitals:  Vitals Value Taken Time  BP 175/65 11/25/19 1251  Temp    Pulse 59 11/25/19 1253  Resp 11 11/25/19 1253  SpO2 100 % 11/25/19 1253  Vitals shown include unvalidated device data.  Last Pain:  Vitals:   11/25/19 1023  TempSrc: Temporal  PainSc: 2          Complications: No apparent anesthesia complications

## 2019-11-25 NOTE — Anesthesia Preprocedure Evaluation (Addendum)
Anesthesia Evaluation  Patient identified by MRN, date of birth, ID band Patient awake    Reviewed: Allergy & Precautions, NPO status , Patient's Chart, lab work & pertinent test results  History of Anesthesia Complications Negative for: history of anesthetic complications  Airway Mallampati: III  TM Distance: >3 FB Neck ROM: Full    Dental no notable dental hx. (+)    Pulmonary neg pulmonary ROS,    Pulmonary exam normal        Cardiovascular hypertension, Pt. on medications and Pt. on home beta blockers + CAD and + Peripheral Vascular Disease  Normal cardiovascular exam  Echo 2017: EF 50-55%, akinesis of the basal-midinferolateral myocardium, grade 1 diastolic dysfunction, mild LAE   Neuro/Psych CVA (2017), No Residual Symptoms negative psych ROS   GI/Hepatic negative GI ROS, Neg liver ROS,   Endo/Other  diabetes, Type 2, Oral Hypoglycemic Agents  Renal/GU negative Renal ROS  negative genitourinary   Musculoskeletal  (+) Arthritis ,   Abdominal   Peds  Hematology negative hematology ROS (+)   Anesthesia Other Findings Day of surgery medications reviewed with patient.  Reproductive/Obstetrics negative OB ROS                            Anesthesia Physical Anesthesia Plan  ASA: II  Anesthesia Plan: General   Post-op Pain Management:    Induction: Intravenous  PONV Risk Score and Plan: 4 or greater and Treatment may vary due to age or medical condition and Ondansetron  Airway Management Planned: LMA  Additional Equipment: None  Intra-op Plan:   Post-operative Plan: Extubation in OR  Informed Consent: I have reviewed the patients History and Physical, chart, labs and discussed the procedure including the risks, benefits and alternatives for the proposed anesthesia with the patient or authorized representative who has indicated his/her understanding and acceptance.     Dental  advisory given  Plan Discussed with: CRNA  Anesthesia Plan Comments:        Anesthesia Quick Evaluation

## 2019-11-25 NOTE — Progress Notes (Signed)
Placed hydralazine order under Dr. Daiva Huge in error. Ordered by Dr. Criss Rosales, anesthesiologist.

## 2019-11-25 NOTE — Op Note (Signed)
OPERATIVE REPORT  DATE OF SURGERY: 11/25/2019  PATIENT:  Sharon Wilson,  79 y.o. female  PRE-OPERATIVE DIAGNOSIS:  CYST IN EAR CANAL  POST-OPERATIVE DIAGNOSIS:  CYST IN EAR CANAL  PROCEDURE:  Procedure(s): EXCISION EAR CYST  SURGEON:  Beckie Salts, MD  ASSISTANTS: None  ANESTHESIA:   General   EBL: 20 ml  DRAINS: None  LOCAL MEDICATIONS USED: 1% Xylocaine with epinephrine  SPECIMEN: Left external auditory canal skin mass  COUNTS:  Correct  PROCEDURE DETAILS: The patient was taken to the operating room and placed on the operating table in the supine position. Following induction of general laryngeal mask airway anesthesia, the left ear was prepped and draped in standard fashion.  The operating microscope was brought into view and draped in a sterile fashion as well.  Inspection of the ear canal revealed a large rounded granulomatous mass obscuring the entire external meatus.  The external meatus was infiltrated with local anesthetic solution in 4 quadrants.  Cup forceps were then used to remove the soft tissue obstructing the meatus.  This was done in a piecemeal fashion and fragments were sent for pathologic evaluation.  The attachment seem to be posteroinferior and the tissue was very soft and friable.  After the obstructing lesion was completely removed the deep ear canal was inspected and appeared to be healthy all the way down to the drum and the middle ear looks normal.  Palpation around the external meatus inferiorly revealed some erosion of the lateralmost aspect of the ear canal bone.  At this point a round knife was used to elevate the skin off of the bone down in the direction of the canal towards the tympanic membrane.  A 2 mm cutting bur was then used with otologic drill to remove some of the irregular bone around the bony defect.  Additional soft tissue was removed with forceps and sent along with the specimen.  The bone was smoothed out with the drill and then  opened up into the ear canal bone and a less acute angle.  When all of the soft tissue was removed the wound was packed with quarter inch iodoform gauze.  Packing was then placed in the ear canal and the external meatus as well to hold it in place.  A cottonball with bacitracin was placed externally.  Patient was awakened extubated and transferred to recovery in stable condition.    PATIENT DISPOSITION:  To PACU, stable

## 2019-11-25 NOTE — Interval H&P Note (Signed)
History and Physical Interval Note:  11/25/2019 11:29 AM  Sharon Wilson  has presented today for surgery, with the diagnosis of CYST IN EAR CANAL.  The various methods of treatment have been discussed with the patient and family. After consideration of risks, benefits and other options for treatment, the patient has consented to  Procedure(s): TYMPANOPLASTY (Left) as a surgical intervention.  The patient's history has been reviewed, patient examined, no change in status, stable for surgery.  I have reviewed the patient's chart and labs.  Questions were answered to the patient's satisfaction.     Izora Gala

## 2019-11-26 ENCOUNTER — Encounter: Payer: Self-pay | Admitting: *Deleted

## 2019-11-26 LAB — SURGICAL PATHOLOGY

## 2020-01-09 ENCOUNTER — Ambulatory Visit: Payer: Medicare Other | Attending: Internal Medicine

## 2020-01-09 DIAGNOSIS — Z23 Encounter for immunization: Secondary | ICD-10-CM | POA: Insufficient documentation

## 2020-01-09 NOTE — Progress Notes (Signed)
   Covid-19 Vaccination Clinic  Name:  Sharon Wilson    MRN: [id number] DOB: [birth year]  01/09/2020  Sharon Wilson was observed post Covid-19 immunization for 15 minutes without incident. She was provided with Vaccine Information Sheet and instruction to access the V-Safe system.   Sharon Wilson was instructed to call 911 with any severe reactions post vaccine: Marland Kitchen Difficulty breathing  . Swelling of face and throat  . A fast heartbeat  . A bad rash all over body  . Dizziness and weakness

## 2020-01-15 ENCOUNTER — Other Ambulatory Visit: Payer: Self-pay | Admitting: Cardiology

## 2020-01-27 ENCOUNTER — Other Ambulatory Visit: Payer: Self-pay | Admitting: Internal Medicine

## 2020-01-28 NOTE — Telephone Encounter (Signed)
Patient need to schedule an ov for more refills. Lm

## 2020-02-05 ENCOUNTER — Ambulatory Visit: Payer: Medicare Other | Attending: Internal Medicine

## 2020-02-05 DIAGNOSIS — Z23 Encounter for immunization: Secondary | ICD-10-CM

## 2020-02-05 NOTE — Progress Notes (Signed)
   Covid-19 Vaccination Clinic  Name:  Sharon Wilson    MRN: [id number] DOB: July 14, 1941  02/05/2020  [name] was observed post Covid-19 immunization for 15 minutes without incident. She was provided with Vaccine Information Sheet and instruction to access the V-Safe system.   [name] was instructed to call 911 with any severe reactions post vaccine: Marland Kitchen Difficulty breathing  . Swelling of face and throat  . A fast heartbeat  . A bad rash all over body  . Dizziness and weakness   Immunizations Administered    Name Date Dose VIS Date Route   Pfizer COVID-19 Vaccine 02/05/2020  1:51 PM 0.3 mL 10/18/2019 Intramuscular   Manufacturer: [employer], [employer]   Lot: [id number]   [place]: [id number]

## 2020-02-20 ENCOUNTER — Other Ambulatory Visit: Payer: Self-pay | Admitting: Cardiology

## 2020-04-17 ENCOUNTER — Other Ambulatory Visit: Payer: Self-pay | Admitting: Internal Medicine

## 2020-04-29 ENCOUNTER — Other Ambulatory Visit: Payer: Self-pay | Admitting: Cardiology

## 2020-06-03 LAB — HM DIABETES EYE EXAM

## 2020-06-11 NOTE — Addendum Note (Signed)
Addended by: Marrion Coy on: 06/11/2020 04:15 PM   Modules accepted: Orders

## 2020-06-26 ENCOUNTER — Other Ambulatory Visit: Payer: Self-pay | Admitting: Internal Medicine

## 2020-07-03 ENCOUNTER — Other Ambulatory Visit: Payer: Self-pay

## 2020-07-03 ENCOUNTER — Other Ambulatory Visit (INDEPENDENT_AMBULATORY_CARE_PROVIDER_SITE_OTHER): Payer: Medicare Other

## 2020-07-03 DIAGNOSIS — I739 Peripheral vascular disease, unspecified: Secondary | ICD-10-CM

## 2020-07-03 DIAGNOSIS — E785 Hyperlipidemia, unspecified: Secondary | ICD-10-CM

## 2020-07-03 DIAGNOSIS — Z79899 Other long term (current) drug therapy: Secondary | ICD-10-CM

## 2020-07-03 DIAGNOSIS — Z8673 Personal history of transient ischemic attack (TIA), and cerebral infarction without residual deficits: Secondary | ICD-10-CM

## 2020-07-03 DIAGNOSIS — I10 Essential (primary) hypertension: Secondary | ICD-10-CM

## 2020-07-03 DIAGNOSIS — E119 Type 2 diabetes mellitus without complications: Secondary | ICD-10-CM

## 2020-07-04 LAB — CBC WITH DIFFERENTIAL/PLATELET
Absolute Monocytes: 418 cells/uL (ref 200–950)
Basophils Absolute: 19 cells/uL (ref 0–200)
Basophils Relative: 0.4 %
Eosinophils Absolute: 514 cells/uL — ABNORMAL HIGH (ref 15–500)
Eosinophils Relative: 10.7 %
HCT: 34.4 % — ABNORMAL LOW (ref 35.0–45.0)
Hemoglobin: 11.1 g/dL — ABNORMAL LOW (ref 11.7–15.5)
Lymphs Abs: 931 cells/uL (ref 850–3900)
MCH: 29.9 pg (ref 27.0–33.0)
MCHC: 32.3 g/dL (ref 32.0–36.0)
MCV: 92.7 fL (ref 80.0–100.0)
MPV: 10.3 fL (ref 7.5–12.5)
Monocytes Relative: 8.7 %
Neutro Abs: 2918 cells/uL (ref 1500–7800)
Neutrophils Relative %: 60.8 %
Platelets: 165 10*3/uL (ref 140–400)
RBC: 3.71 10*6/uL — ABNORMAL LOW (ref 3.80–5.10)
RDW: 12.4 % (ref 11.0–15.0)
Total Lymphocyte: 19.4 %
WBC: 4.8 10*3/uL (ref 3.8–10.8)

## 2020-07-04 LAB — HEPATIC FUNCTION PANEL
AG Ratio: 2.7 (calc) — ABNORMAL HIGH (ref 1.0–2.5)
ALT: 8 U/L (ref 6–29)
AST: 15 U/L (ref 10–35)
Albumin: 4.3 g/dL (ref 3.6–5.1)
Alkaline phosphatase (APISO): 52 U/L (ref 37–153)
Bilirubin, Direct: 0.1 mg/dL (ref 0.0–0.2)
Globulin: 1.6 g/dL (calc) — ABNORMAL LOW (ref 1.9–3.7)
Indirect Bilirubin: 0.5 mg/dL (calc) (ref 0.2–1.2)
Total Bilirubin: 0.6 mg/dL (ref 0.2–1.2)
Total Protein: 5.9 g/dL — ABNORMAL LOW (ref 6.1–8.1)

## 2020-07-04 LAB — BASIC METABOLIC PANEL
BUN/Creatinine Ratio: 12 (calc) (ref 6–22)
BUN: 12 mg/dL (ref 7–25)
CO2: 31 mmol/L (ref 20–32)
Calcium: 9.5 mg/dL (ref 8.6–10.4)
Chloride: 104 mmol/L (ref 98–110)
Creat: 1 mg/dL — ABNORMAL HIGH (ref 0.60–0.88)
Glucose, Bld: 126 mg/dL — ABNORMAL HIGH (ref 65–99)
Potassium: 4.7 mmol/L (ref 3.5–5.3)
Sodium: 140 mmol/L (ref 135–146)

## 2020-07-04 LAB — LIPID PANEL
Cholesterol: 125 mg/dL (ref <200)
HDL: 41 mg/dL — ABNORMAL LOW (ref >=50)
LDL Cholesterol (Calc): 63 mg/dL (calc)
Non-HDL Cholesterol (Calc): 84 mg/dL (calc) (ref <130)
Total CHOL/HDL Ratio: 3 (calc) (ref <5.0)
Triglycerides: 126 mg/dL (ref <150)

## 2020-07-04 LAB — TSH: TSH: 4.38 mIU/L (ref 0.40–4.50)

## 2020-07-04 LAB — HEMOGLOBIN A1C
Hgb A1c MFr Bld: 6.7 % of total Hgb — ABNORMAL HIGH (ref <5.7)
Mean Plasma Glucose: 146 (calc)
eAG (mmol/L): 8.1 (calc)

## 2020-07-04 LAB — MICROALBUMIN / CREATININE URINE RATIO
Creatinine, Urine: 126 mg/dL (ref 20–275)
Microalb Creat Ratio: 21 mcg/mg creat (ref <30)
Microalb, Ur: 2.6 mg/dL

## 2020-07-07 ENCOUNTER — Other Ambulatory Visit: Payer: Self-pay

## 2020-07-08 MED ORDER — CARVEDILOL 12.5 MG PO TABS: ORAL_TABLET | ORAL | 0 refills | Status: DC

## 2020-07-11 NOTE — Progress Notes (Signed)
Will review at upcoming visit   mild anemia  sugar is   improved

## 2020-07-14 NOTE — Progress Notes (Signed)
Chief Complaint  Patient presents with  . Medicare Wellness    Doing well  . Annual Exam  . Medication Management    HPI: Sharon Wilson 79 y.o. comes in today for Preventive Medicare exam/ wellness visit . And  Chronic disease management   BP   At home 106  Better .  At home  . 120.     LIPIDS meds ok  DM   Checking high sincde ear infection  About 130  Anemia : pvd cad  utd  Carotid due next month  Eye cataract left eye maybe.  Lost some hearing  And   Had surgery .   bac ear infection left   Didn't eat  Much over a month and lost weight and now doing better  utd covid vaccine Health Maintenance  Topic Date Due  . TETANUS/TDAP  08/22/2019  . INFLUENZA VACCINE  06/07/2020  . HEMOGLOBIN A1C  01/03/2021  . OPHTHALMOLOGY EXAM  06/03/2021  . URINE MICROALBUMIN  07/03/2021  . FOOT EXAM  07/15/2021  . DEXA SCAN  Completed  . COVID-19 Vaccine  Completed  . PNA vac Low Risk Adult  Completed   Health Maintenance Review LIFESTYLE:  Exercise:  treis to walk  Bowl  A little off balance but no falling  Tobacco/ETS:n Alcohol: rare Sugar beverages:n Sleep:y Drug use: no HH:2   Hearing: ok  Vision:  . Last eye check UTD  Safety:  Has smoke detector and wears seat belts.   No excess sun exposure. Sees dentist regularly.  Falls: n  Memory: Felt to be stable  Ok  Some decrease since stroke per husband but coping quitte well, patient not concerned Depression: No anhedonia unusual crying or depressive symptoms  Nutrition: Eats well balanced diet; adequate calcium and vitamin D. No swallowing chewing problems.  Injury: no major injuries in the last six months.  Other healthcare providers:  Reviewed today .  Preventive parameters: up-to-date  Reviewed   ADLS:   There are no problems or need for assistance  driving, feeding, obtaining food, dressing, toileting and bathing, managing money using phone. She is independent. But doesn need helpf lifting heavy thing s in the  kitechen from residual weakness     ROS:  GEN/ HEENT: No fever, significant weight changes sweats headaches vision problems hearing changes, CV/ PULM; No chest pain shortness of breath cough, syncope,edema  change in exercise tolerance. GI /GU: No adominal pain, vomiting, change in bowel habits. No blood in the stool. No significant GU symptoms. SKIN/HEME: ,no acute skin rashes suspicious lesions or bleeding. No lymphadenopathy, nodules, masses.  NEURO/ PSYCH:  No neurologic signs such as weakness numbness. No depression anxiety. IMM/ Allergy: No unusual infections.  Allergy .   REST of 12 system review negative except as per HPI   Past Medical History:  Diagnosis Date  . Abnormal echocardiogram    a.07/2016 Echo: EF 50-55%, basal and mid inferolateral AK.  . Agatston coronary artery calcium score greater than 400    a. 07/2016 Abnl Echo with basal and mid inferolateral AK, EF 50-55%; b. 09/01/2016 Ex MV: EF 46%, hypertensive response, no ST changes, apical thinning with very mild apical ischemia;  c. 09/21/2016 Cardiac CTA: Ca2+ score = 717, 90th percentile. Extensive CAD w/ mod stenosis in distal LM/ostial LAD, 50% RPLV-->rec Cath.  . Anemia    hysterectomy  . CAD (coronary artery disease)    a. 09/01/2016 Ex MV: EF 46%, hypertensive response, no ST changes, apical thinning  with very mild apical ischemia;  c. 09/21/2016 Cardiac CTA: Ca2+ score = 717, 90th percentile. Extensive CAD w/ mod stenosis in distal LM/ostial LAD, 50% RPLV-->rec Cath  . 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 Diverticulosis   . Hyperlipidemia   . OA (osteoarthritis)   . Stroke (cerebrum) ([place])    a. 07/2016 L facial droop-->Head CT: small area of low attenuation in the right frontoparietal region c/w acute or subacute lacunar infarct; 07/2016 MRI/A brain: acute infarct of right centrum semiovale and possibly posterior limb of right internal capsule. Mild  to moderate multifocal narrowing of the MCA arteries w/o occlusion.  . Type II diabetes mellitus ([place])    a. 07/2016 A1c 7.3.  . White coat hypertension     Family History  Problem Relation Age of Onset  . Cancer Mother        breast  . Breast cancer Mother   . CAD Neg Hx     Social History   Socioeconomic History  . Marital status: Married    Spouse name: Not on file  . Number of children: Not on file  . Years of education: Not on file  . Highest education level: Not on file  Occupational History  . Not on file  Tobacco Use  . Smoking status: Never Smoker  . Smokeless tobacco: Never Used  Vaping Use  . Vaping Use: Never used  Substance and Sexual Activity  . Alcohol use: No    Alcohol/week: 0.0 standard drinks  . Drug use: No  . Sexual activity: Not on file  Other Topics Concern  . Not on file  Social History Narrative   Lives in [place] with husband.   Retired.   hh of 2   No pets   Social Determinants of Health   Financial Resource Strain:   . Difficulty of Paying Living Expenses: Not on file  Food Insecurity:   . Worried About Charity fundraiser in the Last Year: Not on file  . Ran Out of Food in the Last Year: Not on file  Transportation Needs:   . Lack of Transportation (Medical): Not on file  . Lack of Transportation (Non-Medical): Not on file  Physical Activity:   . Days of Exercise per Week: Not on file  . Minutes of Exercise per Session: Not on file  Stress:   . Feeling of Stress : Not on file  Social Connections:   . Frequency of Communication with Friends and Family: Not on file  . Frequency of Social Gatherings with Friends and Family: Not on file  . Attends Religious Services: Not on file  . Active Member of Clubs or Organizations: Not on file  . Attends Archivist Meetings: Not on file  . Marital Status: Not on file    Outpatient Encounter Medications as of 07/15/2020  Medication Sig  . acetaminophen (TYLENOL) 325 MG tablet Take 2  tablets (650 mg total) by mouth every 4 (four) hours as needed for mild pain or moderate pain.  Marland Kitchen aspirin EC 81 MG tablet Take 1 tablet (81 mg total) by mouth daily.  . Calcium Carbonate-Vitamin D (OSCAL 500/200 D-3 PO) Take 1 tablet by mouth 2 (two) times daily.   . carvedilol (COREG) 12.5 MG tablet Take one and one-half tablets by mouth twice daily with meals.  . clopidogrel (PLAVIX) 75 MG tablet Take 1 tablet (75 mg total) by mouth daily.  Marland Kitchen  HYDROcodone-acetaminophen (NORCO) 7.5-325 MG tablet Take 1 tablet by mouth every 6 (six) hours as needed for moderate pain.  . isosorbide mononitrate (IMDUR) 30 MG 24 hr tablet Take 1/2 tablet by mouth daily.  . metFORMIN (GLUCOPHAGE) 500 MG tablet Take 1 tablet by mouth every morning and 2 tablets by mouth in the evening.  . neomycin-polymyxin-hydrocortisone (CORTISPORIN) 3.5-10000-1 OTIC suspension Place 3 drops into the left ear daily.  . nitroGLYCERIN (NITROSTAT) 0.4 MG SL tablet Place 1 tablet (0.4 mg total) under the tongue every 5 (five) minutes x 3 doses as needed for chest pain.  . promethazine (PHENERGAN) 25 MG suppository Place 1 suppository (25 mg total) rectally every 6 (six) hours as needed for nausea or vomiting.  . rosuvastatin (CRESTOR) 5 MG tablet Take 1 tablet by mouth daily as tolerated.   No facility-administered encounter medications on file as of 07/15/2020.    EXAM:  BP 136/76   Pulse 71   Temp 98.6 F (37 C) (Oral)   Ht 5' 1.5" (1.562 m)   Wt 121 lb 12.8 oz (55.2 kg)   SpO2 95%   BMI 22.64 kg/m   Body mass index is 22.64 kg/m. Wt Readings from Last 3 Encounters:  07/15/20 121 lb 12.8 oz (55.2 kg)  11/25/19 110 lb 3.7 oz (50 kg)  11/22/19 112 lb (50.8 kg)    Physical Exam: Vital signs reviewed UXL:KGMW is a well-developed well-nourished alert cooperative   who appears stated age in no acute distress.  HEENT: normocephalic atraumatic , Eyes: PERRL EOM's full, conjunctiva clear, Nares: paten,t no deformity discharge  or tenderness., Ears: no deformity EAC's clear TMs with normal landmarks. Mouth: masked  NECK: supple without masses, thyromegaly or bruits. CHEST/PULM:  Clear to auscultation and percussion breath sounds equal no wheeze , rales or rhonchi. No chest wall deformities or tenderness. Breast no nodules  CV: PMI is nondisplaced, S1 S2 no gallops, murmurs, rubs. Peripheral pulses are full without delay.No JVD .  ABDOMEN: Bowel sounds normal nontender  No guard or rebound, no hepato splenomegal no CVA tenderness.   Extremtities:  No clubbing cyanosis or edema, no acute joint swelling or redness no focal atrophy NEURO:  Oriented x3, cranial nerves 3-12 appear to be intact, gait within normal limits no abnormal reflexes or asymmetrical  SKIN: No acute rashes normal turgor, color, no bruising or petechiae. PSYCH: Oriented, good eye contact, no obvious depression anxiety, cognition and judgment appear normal. LN: no cervical axillary inguinal adenopathy No noted deficits in memory, attention, and speech. Diabetic Foot Exam - Simple   Simple Foot Form Diabetic Foot exam was performed with the following findings: Yes 07/15/2020 11:36 AM  Visual Inspection No deformities, no ulcerations, no other skin breakdown bilaterally: Yes Sensation Testing Intact to touch and monofilament testing bilaterally: Yes Pulse Check Posterior Tibialis and Dorsalis pulse intact bilaterally: Yes Comments      Lab Results  Component Value Date   WBC 4.8 07/03/2020   HGB 11.1 (L) 07/03/2020   HCT 34.4 (L) 07/03/2020   PLT 165 07/03/2020   GLUCOSE 126 (H) 07/03/2020   CHOL 125 07/03/2020   TRIG 126 07/03/2020   HDL 41 (L) 07/03/2020   LDLDIRECT 136.0 05/05/2016   LDLCALC 63 07/03/2020   ALT 8 07/03/2020   AST 15 07/03/2020   NA 140 07/03/2020   K 4.7 07/03/2020   CL 104 07/03/2020   CREATININE 1.00 (H) 07/03/2020   BUN 12 07/03/2020   CO2 31 07/03/2020   TSH 4.38  07/03/2020   INR 1.0 09/22/2016   HGBA1C 6.7  (H) 07/03/2020   MICROALBUR 2.6 07/03/2020    ASSESSMENT AND PLAN:  Discussed the following assessment and plan:  Visit for preventive health examination  Medication management - Plan: CBC with Differential/Platelet, Iron, TIBC and Ferritin Panel, Vitamin B12, Hepatic function panel  Non-insulin dependent type 2 diabetes mellitus (HCC) - a1c in good range  - Plan: CBC with Differential/Platelet, Iron, TIBC and Ferritin Panel, Vitamin B12, Hepatic function panel  Anemia, unspecified type - new needs fu - Plan: CBC with Differential/Platelet, Iron, TIBC and Ferritin Panel, Vitamin B12, Hepatic function panel  Essential hypertension - good control for her   Peripheral arterial disease (HCC)  History of stroke Mild anemia    Uncertain cause but had serious  Weight loss with her ear predicament and now better  Plan fu labs in about a months and then fu planned  Check b12 since on metformin  For  While  prob was malnourished at that time  Dm controlled   Patient Care Team: Syrus Nakama, Standley Brooking, MD as PCP - General Sueanne Margarita, MD as PCP - Cardiology (Cardiology) Marygrace Drought, MD as Attending Physician (Ophthalmology) Milus Height, MD as Referring Physician (Ophthalmology) End, Harrell Gave, MD as Consulting Physician (Cardiology) Sueanne Margarita, MD as Consulting Physician (Cardiology) Izora Gala, MD as Consulting Physician (Otolaryngology)  Patient Instructions  Plan  Lab in about a month  To fu on mild anemia.  Keep a healthy diet for now  . If all ok then we  Will plan 6 mos fu .   Health Maintenance, Female Adopting a healthy lifestyle and getting preventive care are important in promoting health and wellness. Ask your health care provider about:  The right schedule for you to have regular tests and exams.  Things you can do on your own to prevent diseases and keep yourself healthy. What should I know about diet, weight, and exercise? Eat a healthy diet   Eat  a diet that includes plenty of vegetables, fruits, low-fat dairy products, and lean protein.  Do not eat a lot of foods that are high in solid fats, added sugars, or sodium. Maintain a healthy weight Body mass index (BMI) is used to identify weight problems. It estimates body fat based on height and weight. Your health care provider can help determine your BMI and help you achieve or maintain a healthy weight. Get regular exercise Get regular exercise. This is one of the most important things you can do for your health. Most adults should:  Exercise for at least 150 minutes each week. The exercise should increase your heart rate and make you sweat (moderate-intensity exercise).  Do strengthening exercises at least twice a week. This is in addition to the moderate-intensity exercise.  Spend less time sitting. Even light physical activity can be beneficial. Watch cholesterol and blood lipids Have your blood tested for lipids and cholesterol at 80 years of age, then have this test every 5 years. Have your cholesterol levels checked more often if:  Your lipid or cholesterol levels are high.  You are older than 80 years of age.  You are at high risk for heart disease. What should I know about cancer screening? Depending on your health history and family history, you may need to have cancer screening at various ages. This may include screening for:  Breast cancer.  Cervical cancer.  Colorectal cancer.  Skin cancer.  Lung cancer. What should I know about heart  disease, diabetes, and high blood pressure? Blood pressure and heart disease  High blood pressure causes heart disease and increases the risk of stroke. This is more likely to develop in people who have high blood pressure readings, are of African descent, or are overweight.  Have your blood pressure checked: ? Every 3-5 years if you are 16-38 years of age. ? Every year if you are 64 years old or older. Diabetes Have regular  diabetes screenings. This checks your fasting blood sugar level. Have the screening done:  Once every three years after age 59 if you are at a normal weight and have a low risk for diabetes.  More often and at a younger age if you are overweight or have a high risk for diabetes. What should I know about preventing infection? Hepatitis B If you have a higher risk for hepatitis B, you should be screened for this virus. Talk with your health care provider to find out if you are at risk for hepatitis B infection. Hepatitis C Testing is recommended for:  Everyone born from [birth year] through 1966.  Anyone with known risk factors for hepatitis C. Sexually transmitted infections (STIs)  Get screened for STIs, including gonorrhea and chlamydia, if: ? You are sexually active and are younger than 80 years of age. ? You are older than 80 years of age and your health care provider tells you that you are at risk for this type of infection. ? Your sexual activity has changed since you were last screened, and you are at increased risk for chlamydia or gonorrhea. Ask your health care provider if you are at risk.  Ask your health care provider about whether you are at high risk for HIV. Your health care provider may recommend a prescription medicine to help prevent HIV infection. If you choose to take medicine to prevent HIV, you should first get tested for HIV. You should then be tested every 3 months for as long as you are taking the medicine. Pregnancy  If you are about to stop having your period (premenopausal) and you may become pregnant, seek counseling before you get pregnant.  Take 400 to 800 micrograms (mcg) of folic acid every day if you become pregnant.  Ask for birth control (contraception) if you want to prevent pregnancy. Osteoporosis and menopause Osteoporosis is a disease in which the bones lose minerals and strength with aging. This can result in bone fractures. If you are 12 years old or  older, or if you are at risk for osteoporosis and fractures, ask your health care provider if you should:  Be screened for bone loss.  Take a calcium or vitamin D supplement to lower your risk of fractures.  Be given hormone replacement therapy (HRT) to treat symptoms of menopause. Follow these instructions at home: Lifestyle  Do not use any products that contain nicotine or tobacco, such as cigarettes, e-cigarettes, and chewing tobacco. If you need help quitting, ask your health care provider.  Do not use street drugs.  Do not share needles.  Ask your health care provider for help if you need support or information about quitting drugs. Alcohol use  Do not drink alcohol if: ? Your health care provider tells you not to drink. ? You are pregnant, may be pregnant, or are planning to become pregnant.  If you drink alcohol: ? Limit how much you use to 0-1 drink a day. ? Limit intake if you are breastfeeding.  Be aware of how much alcohol is in  your drink. In the [place] one drink equals one 12 oz bottle of beer (355 mL), one 5 oz glass of wine (148 mL), or one 1 oz glass of hard liquor (44 mL). General instructions  Schedule regular health, dental, and eye exams.  Stay current with your vaccines.  Tell your health care provider if: ? You often feel depressed. ? You have ever been abused or do not feel safe at home. Summary  Adopting a healthy lifestyle and getting preventive care are important in promoting health and wellness.  Follow your health care provider's instructions about healthy diet, exercising, and getting tested or screened for diseases.  Follow your health care provider's instructions on monitoring your cholesterol and blood pressure. This information is not intended to replace advice given to you by your health care provider. Make sure you discuss any questions you have with your health care provider. Document Revised: 10/17/2018 Document Reviewed:  10/17/2018 Elsevier Patient Education  2020 [place] Ramar Nobrega M.D.

## 2020-07-15 ENCOUNTER — Encounter: Payer: Self-pay | Admitting: Internal Medicine

## 2020-07-15 ENCOUNTER — Ambulatory Visit (INDEPENDENT_AMBULATORY_CARE_PROVIDER_SITE_OTHER): Payer: Medicare Other | Admitting: Internal Medicine

## 2020-07-15 ENCOUNTER — Other Ambulatory Visit: Payer: Self-pay

## 2020-07-15 VITALS — BP 136/76 | HR 71 | Temp 98.6°F | Ht 61.5 in | Wt 121.8 lb

## 2020-07-15 DIAGNOSIS — D649 Anemia, unspecified: Secondary | ICD-10-CM | POA: Diagnosis not present

## 2020-07-15 DIAGNOSIS — Z79899 Other long term (current) drug therapy: Secondary | ICD-10-CM | POA: Diagnosis not present

## 2020-07-15 DIAGNOSIS — Z0000 Encounter for general adult medical examination without abnormal findings: Secondary | ICD-10-CM | POA: Diagnosis not present

## 2020-07-15 DIAGNOSIS — E119 Type 2 diabetes mellitus without complications: Secondary | ICD-10-CM

## 2020-07-15 DIAGNOSIS — I739 Peripheral vascular disease, unspecified: Secondary | ICD-10-CM

## 2020-07-15 DIAGNOSIS — I10 Essential (primary) hypertension: Secondary | ICD-10-CM

## 2020-07-15 DIAGNOSIS — Z8673 Personal history of transient ischemic attack (TIA), and cerebral infarction without residual deficits: Secondary | ICD-10-CM

## 2020-07-15 NOTE — Patient Instructions (Addendum)
Plan  Lab in about a month  To fu on mild anemia.  Keep a healthy diet for now  . If all ok then we  Will plan 6 mos fu .   Health Maintenance, Female Adopting a healthy lifestyle and getting preventive care are important in promoting health and wellness. Ask your health care provider about:  The right schedule for you to have regular tests and exams.  Things you can do on your own to prevent diseases and keep yourself healthy. What should I know about diet, weight, and exercise? Eat a healthy diet   Eat a diet that includes plenty of vegetables, fruits, low-fat dairy products, and lean protein.  Do not eat a lot of foods that are high in solid fats, added sugars, or sodium. Maintain a healthy weight Body mass index (BMI) is used to identify weight problems. It estimates body fat based on height and weight. Your health care provider can help determine your BMI and help you achieve or maintain a healthy weight. Get regular exercise Get regular exercise. This is one of the most important things you can do for your health. Most adults should:  Exercise for at least 150 minutes each week. The exercise should increase your heart rate and make you sweat (moderate-intensity exercise).  Do strengthening exercises at least twice a week. This is in addition to the moderate-intensity exercise.  Spend less time sitting. Even light physical activity can be beneficial. Watch cholesterol and blood lipids Have your blood tested for lipids and cholesterol at 80 years of age, then have this test every 5 years. Have your cholesterol levels checked more often if:  Your lipid or cholesterol levels are high.  You are older than 80 years of age.  You are at high risk for heart disease. What should I know about cancer screening? Depending on your health history and family history, you may need to have cancer screening at various ages. This may include screening for:  Breast cancer.  Cervical  cancer.  Colorectal cancer.  Skin cancer.  Lung cancer. What should I know about heart disease, diabetes, and high blood pressure? Blood pressure and heart disease  High blood pressure causes heart disease and increases the risk of stroke. This is more likely to develop in people who have high blood pressure readings, are of African descent, or are overweight.  Have your blood pressure checked: ? Every 3-5 years if you are 62-27 years of age. ? Every year if you are 45 years old or older. Diabetes Have regular diabetes screenings. This checks your fasting blood sugar level. Have the screening done:  Once every three years after age 68 if you are at a normal weight and have a low risk for diabetes.  More often and at a younger age if you are overweight or have a high risk for diabetes. What should I know about preventing infection? Hepatitis B If you have a higher risk for hepatitis B, you should be screened for this virus. Talk with your health care provider to find out if you are at risk for hepatitis B infection. Hepatitis C Testing is recommended for:  Everyone born from [birth year] through 1965.  Anyone with known risk factors for hepatitis C. Sexually transmitted infections (STIs)  Get screened for STIs, including gonorrhea and chlamydia, if: ? You are sexually active and are younger than 80 years of age. ? You are older than 80 years of age and your health care provider tells you that  you are at risk for this type of infection. ? Your sexual activity has changed since you were last screened, and you are at increased risk for chlamydia or gonorrhea. Ask your health care provider if you are at risk.  Ask your health care provider about whether you are at high risk for HIV. Your health care provider may recommend a prescription medicine to help prevent HIV infection. If you choose to take medicine to prevent HIV, you should first get tested for HIV. You should then be tested every 3  months for as long as you are taking the medicine. Pregnancy  If you are about to stop having your period (premenopausal) and you may become pregnant, seek counseling before you get pregnant.  Take 400 to 800 micrograms (mcg) of folic acid every day if you become pregnant.  Ask for birth control (contraception) if you want to prevent pregnancy. Osteoporosis and menopause Osteoporosis is a disease in which the bones lose minerals and strength with aging. This can result in bone fractures. If you are 65 years old or older, or if you are at risk for osteoporosis and fractures, ask your health care provider if you should:  Be screened for bone loss.  Take a calcium or vitamin D supplement to lower your risk of fractures.  Be given hormone replacement therapy (HRT) to treat symptoms of menopause. Follow these instructions at home: Lifestyle  Do not use any products that contain nicotine or tobacco, such as cigarettes, e-cigarettes, and chewing tobacco. If you need help quitting, ask your health care provider.  Do not use street drugs.  Do not share needles.  Ask your health care provider for help if you need support or information about quitting drugs. Alcohol use  Do not drink alcohol if: ? Your health care provider tells you not to drink. ? You are pregnant, may be pregnant, or are planning to become pregnant.  If you drink alcohol: ? Limit how much you use to 0-1 drink a day. ? Limit intake if you are breastfeeding.  Be aware of how much alcohol is in your drink. In the [place] one drink equals one 12 oz bottle of beer (355 mL), one 5 oz glass of wine (148 mL), or one 1 oz glass of hard liquor (44 mL). General instructions  Schedule regular health, dental, and eye exams.  Stay current with your vaccines.  Tell your health care provider if: ? You often feel depressed. ? You have ever been abused or do not feel safe at home. Summary  Adopting a healthy lifestyle and getting  preventive care are important in promoting health and wellness.  Follow your health care provider's instructions about healthy diet, exercising, and getting tested or screened for diseases.  Follow your health care provider's instructions on monitoring your cholesterol and blood pressure. This information is not intended to replace advice given to you by your health care provider. Make sure you discuss any questions you have with your health care provider. Document Revised: 10/17/2018 Document Reviewed: 10/17/2018 Elsevier Patient Education  2020 [employer].

## 2020-07-20 ENCOUNTER — Other Ambulatory Visit: Payer: Self-pay | Admitting: Cardiology

## 2020-07-22 ENCOUNTER — Other Ambulatory Visit: Payer: Self-pay | Admitting: Internal Medicine

## 2020-07-28 ENCOUNTER — Other Ambulatory Visit: Payer: Self-pay | Admitting: Internal Medicine

## 2020-07-29 ENCOUNTER — Encounter (HOSPITAL_COMMUNITY): Payer: Medicare Other

## 2020-08-12 ENCOUNTER — Telehealth: Payer: Self-pay | Admitting: *Deleted

## 2020-08-12 ENCOUNTER — Other Ambulatory Visit: Payer: Self-pay

## 2020-08-12 ENCOUNTER — Other Ambulatory Visit (HOSPITAL_COMMUNITY): Payer: Self-pay | Admitting: Cardiology

## 2020-08-12 ENCOUNTER — Ambulatory Visit (HOSPITAL_COMMUNITY)
Admission: RE | Admit: 2020-08-12 | Discharge: 2020-08-12 | Disposition: A | Discharge status: Home / Self Care | Payer: Medicare Other | Source: Ambulatory Visit | Attending: Cardiology | Admitting: Cardiology

## 2020-08-12 DIAGNOSIS — I6523 Occlusion and stenosis of bilateral carotid arteries: Secondary | ICD-10-CM

## 2020-08-12 DIAGNOSIS — E7800 Pure hypercholesterolemia, unspecified: Secondary | ICD-10-CM

## 2020-08-12 DIAGNOSIS — I2510 Atherosclerotic heart disease of native coronary artery without angina pectoris: Secondary | ICD-10-CM

## 2020-08-12 DIAGNOSIS — I771 Stricture of artery: Secondary | ICD-10-CM

## 2020-08-12 NOTE — Telephone Encounter (Signed)
Pt made aware of carotid US results and recommendations per Dr. Meda Coffee, for her to have this repeated again in one year.  Informed the pt that I will go ahead and place the order in the system, and send our [place] scheduler a message to call the pt back and arrange this for one year out.  Pt verbalized understanding and agrees with this plan.

## 2020-08-12 NOTE — Telephone Encounter (Signed)
-----   Message from Dorothy Spark, MD sent at 08/12/2020  9:57 AM EDT ----- Right Carotid: Velocities in the right ICA are consistent with a 40-59%                stenosis.  Left Carotid: Velocities in the left ICA are consistent with a 40-59% stenosis.                Follow up in a year

## 2020-08-19 ENCOUNTER — Other Ambulatory Visit: Payer: Self-pay | Admitting: Cardiology

## 2020-08-19 ENCOUNTER — Other Ambulatory Visit: Payer: Medicare Other

## 2020-08-19 ENCOUNTER — Other Ambulatory Visit: Payer: Self-pay

## 2020-08-19 ENCOUNTER — Other Ambulatory Visit (INDEPENDENT_AMBULATORY_CARE_PROVIDER_SITE_OTHER): Payer: Medicare Other

## 2020-08-19 DIAGNOSIS — D649 Anemia, unspecified: Secondary | ICD-10-CM

## 2020-08-19 DIAGNOSIS — E119 Type 2 diabetes mellitus without complications: Secondary | ICD-10-CM

## 2020-08-19 DIAGNOSIS — Z79899 Other long term (current) drug therapy: Secondary | ICD-10-CM

## 2020-08-20 LAB — HEPATIC FUNCTION PANEL
AG Ratio: 2.1 (calc) (ref 1.0–2.5)
ALT: 9 U/L (ref 6–29)
AST: 16 U/L (ref 10–35)
Albumin: 4.1 g/dL (ref 3.6–5.1)
Alkaline phosphatase (APISO): 48 U/L (ref 37–153)
Bilirubin, Direct: 0.1 mg/dL (ref 0.0–0.2)
Globulin: 2 g/dL (calc) (ref 1.9–3.7)
Indirect Bilirubin: 0.4 mg/dL (calc) (ref 0.2–1.2)
Total Bilirubin: 0.5 mg/dL (ref 0.2–1.2)
Total Protein: 6.1 g/dL (ref 6.1–8.1)

## 2020-08-20 LAB — CBC WITH DIFFERENTIAL/PLATELET
Absolute Monocytes: 415 cells/uL (ref 200–950)
Basophils Absolute: 30 cells/uL (ref 0–200)
Basophils Relative: 0.6 %
Eosinophils Absolute: 445 cells/uL (ref 15–500)
Eosinophils Relative: 8.9 %
HCT: 34.7 % — ABNORMAL LOW (ref 35.0–45.0)
Hemoglobin: 11.3 g/dL — ABNORMAL LOW (ref 11.7–15.5)
Lymphs Abs: 905 cells/uL (ref 850–3900)
MCH: 30.4 pg (ref 27.0–33.0)
MCHC: 32.6 g/dL (ref 32.0–36.0)
MCV: 93.3 fL (ref 80.0–100.0)
MPV: 10.3 fL (ref 7.5–12.5)
Monocytes Relative: 8.3 %
Neutro Abs: 3205 cells/uL (ref 1500–7800)
Neutrophils Relative %: 64.1 %
Platelets: 162 10*3/uL (ref 140–400)
RBC: 3.72 10*6/uL — ABNORMAL LOW (ref 3.80–5.10)
RDW: 12.3 % (ref 11.0–15.0)
Total Lymphocyte: 18.1 %
WBC: 5 10*3/uL (ref 3.8–10.8)

## 2020-08-20 LAB — IRON,TIBC AND FERRITIN PANEL
%SAT: 15 % (calc) — ABNORMAL LOW (ref 16–45)
Ferritin: 31 ng/mL (ref 16–288)
Iron: 46 ug/dL (ref 45–160)
TIBC: 313 mcg/dL (calc) (ref 250–450)

## 2020-08-20 LAB — VITAMIN B12: Vitamin B-12: 307 pg/mL (ref 200–1100)

## 2020-08-24 ENCOUNTER — Other Ambulatory Visit: Payer: Self-pay | Admitting: Internal Medicine

## 2020-08-27 NOTE — Progress Notes (Signed)
Labs better  still has mild anemia and borderline low iron level b12  low normal  Not alarming but still needs follow.  Take multivitamin with iron for now.( should have b  vits in it)  Order stool hemoccult x 3 to make sure negative for blood loss.   Check another cbcdiff I ferritin and ibc  n  2 mos   dx   anemia

## 2020-09-11 ENCOUNTER — Ambulatory Visit
Admission: RE | Admit: 2020-09-11 | Discharge: 2020-09-11 | Disposition: A | Discharge status: Home / Self Care | Payer: Medicare Other | Source: Ambulatory Visit | Attending: Internal Medicine | Admitting: Internal Medicine

## 2020-09-11 ENCOUNTER — Other Ambulatory Visit: Payer: Self-pay | Admitting: Internal Medicine

## 2020-09-11 ENCOUNTER — Other Ambulatory Visit: Payer: Self-pay

## 2020-09-11 DIAGNOSIS — Z1231 Encounter for screening mammogram for malignant neoplasm of breast: Secondary | ICD-10-CM

## 2020-09-17 ENCOUNTER — Other Ambulatory Visit: Payer: Self-pay | Admitting: Internal Medicine

## 2020-10-07 ENCOUNTER — Telehealth: Payer: Self-pay | Admitting: Internal Medicine

## 2020-10-07 NOTE — Telephone Encounter (Signed)
Left message for patient to call back and schedule Medicare Annual Wellness Visit (AWV) either virtually or in office.   Last AWV 10/11/16  ; please schedule at anytime with [facility] Nurse Health Advisor 1 or 2   This should be a 45 minute visit.

## 2020-10-09 ENCOUNTER — Other Ambulatory Visit: Payer: Self-pay | Admitting: Cardiology

## 2020-10-23 ENCOUNTER — Other Ambulatory Visit: Payer: Self-pay | Admitting: Cardiology

## 2020-10-30 ENCOUNTER — Other Ambulatory Visit: Payer: Self-pay | Admitting: Cardiology

## 2020-11-04 ENCOUNTER — Other Ambulatory Visit: Payer: Self-pay | Admitting: Internal Medicine

## 2020-11-16 ENCOUNTER — Other Ambulatory Visit: Payer: Self-pay | Admitting: Internal Medicine

## 2020-12-02 ENCOUNTER — Other Ambulatory Visit: Payer: Self-pay | Admitting: Internal Medicine

## 2020-12-10 ENCOUNTER — Other Ambulatory Visit: Payer: Self-pay | Admitting: Internal Medicine

## 2020-12-17 ENCOUNTER — Other Ambulatory Visit: Payer: Self-pay | Admitting: Cardiology

## 2020-12-29 ENCOUNTER — Other Ambulatory Visit: Payer: Self-pay | Admitting: Internal Medicine

## 2021-01-01 ENCOUNTER — Other Ambulatory Visit: Payer: Self-pay | Admitting: Cardiology

## 2021-01-05 ENCOUNTER — Other Ambulatory Visit: Payer: Self-pay | Admitting: Internal Medicine

## 2021-01-11 NOTE — Progress Notes (Signed)
Chief Complaint  Patient presents with  . Follow-up  . Medication Refill    Requesting a 90 day supply of the Carvedilol.     HPI: Sharon Wilson 80 y.o. come in for Chronic disease management   BP  110/70 at home today needs refill carvedilol 90 days preferred no side effects reported  Anemia  bruisies easy oin asa and plavix and feels cold a lot but no bleeding from bowels or other sites.  Diabetes on Metformin 3 a day 1500 mg has gained the weight back after her ear infection eating more sweets than usual.  Activity level still bowl   Not as good Shopping developing bunion left foot. Sleep 6-8 hours  HLD taking rosuvastatin 5 mg a day  Taking iron twice a week B12 once a day and multivitamin.  ROS: See pertinent positives and negatives per HPI.  No cardiovascular or neuro symptoms reported.  No numbness.  Chest pain shortness of breath.  To have a follow-up with cardiology has been taking low-dose Crestor.   Past Medical History:  Diagnosis Date  . Abnormal echocardiogram    a.07/2016 Echo: EF 50-55%, basal and mid inferolateral AK.  . Agatston coronary artery calcium score greater than 400    a. 07/2016 Abnl Echo with basal and mid inferolateral AK, EF 50-55%; b. 09/01/2016 Ex MV: EF 46%, hypertensive response, no ST changes, apical thinning with very mild apical ischemia;  c. 09/21/2016 Cardiac CTA: Ca2+ score = 717, 90th percentile. Extensive CAD w/ mod stenosis in distal LM/ostial LAD, 50% RPLV-->rec Cath.  . Anemia    hysterectomy  . CAD (coronary artery disease)    a. 09/01/2016 Ex MV: EF 46%, hypertensive response, no ST changes, apical thinning with very mild apical ischemia;  c. 09/21/2016 Cardiac CTA: Ca2+ score = 717, 90th percentile. Extensive CAD w/ mod stenosis in distal LM/ostial LAD, 50% RPLV-->rec Cath  . 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 Diverticulosis   . Hyperlipidemia   . OA  (osteoarthritis)   . Stroke (cerebrum) ([place])    a. 07/2016 L facial droop-->Head CT: small area of low attenuation in the right frontoparietal region c/w acute or subacute lacunar infarct; 07/2016 MRI/A brain: acute infarct of right centrum semiovale and possibly posterior limb of right internal capsule. Mild to moderate multifocal narrowing of the MCA arteries w/o occlusion.  . Type II diabetes mellitus ([place])    a. 07/2016 A1c 7.3.  . White coat hypertension     Family History  Problem Relation Age of Onset  . Cancer Mother        breast  . Breast cancer Mother   . CAD Neg Hx     Social History   Socioeconomic History  . Marital status: Married    Spouse name: Not on file  . Number of children: Not on file  . Years of education: Not on file  . Highest education level: Not on file  Occupational History  . Not on file  Tobacco Use  . Smoking status: Never Smoker  . Smokeless tobacco: Never Used  Vaping Use  . Vaping Use: Never used  Substance and Sexual Activity  . Alcohol use: No    Alcohol/week: 0.0 standard drinks  . Drug use: No  . Sexual activity: Not on file  Other Topics Concern  . Not on file  Social History Narrative   Lives in [place] with husband.  Retired.   hh of 2   No pets   Social Determinants of Radio broadcast assistant Strain: Not on file  Food Insecurity: Not on file  Transportation Needs: Not on file  Physical Activity: Not on file  Stress: Not on file  Social Connections: Not on file    Outpatient Medications Prior to Visit  Medication Sig Dispense Refill  . acetaminophen (TYLENOL) 325 MG tablet Take 2 tablets (650 mg total) by mouth every 4 (four) hours as needed for mild pain or moderate pain.    Marland Kitchen aspirin EC 81 MG tablet Take 1 tablet (81 mg total) by mouth daily.    . Calcium Carbonate-Vitamin D (OSCAL 500/200 D-3 PO) Take 1 tablet by mouth 2 (two) times daily.     . clopidogrel (PLAVIX) 75 MG tablet Take 1 tablet (75 mg total) by mouth  daily. 90 tablet 1  . Cyanocobalamin (B-12 PO) Take by mouth.    . ferrous sulfate 325 (65 FE) MG tablet Take 325 mg by mouth daily with breakfast.    . HYDROcodone-acetaminophen (NORCO) 7.5-325 MG tablet Take 1 tablet by mouth every 6 (six) hours as needed for moderate pain. 20 tablet 0  . isosorbide mononitrate (IMDUR) 30 MG 24 hr tablet Take one-half tablet by mouth daily. Please make appointment for further refills. 15 tablet 0  . metFORMIN (GLUCOPHAGE) 500 MG tablet TAKE ONE TABLET BY MOUTH IN THE MORNING AND TAKE TWO TABLETS IN THE EVENING 90 tablet 1  . Multiple Vitamins-Minerals (ONE-A-DAY WOMENS 50+ PO) Take by mouth.    . neomycin-polymyxin-hydrocortisone (CORTISPORIN) 3.5-10000-1 OTIC suspension Place 3 drops into the left ear daily. 10 mL 1  . nitroGLYCERIN (NITROSTAT) 0.4 MG SL tablet Place 1 tablet (0.4 mg total) under the tongue every 5 (five) minutes x 3 doses as needed for chest pain. 25 tablet 12  . promethazine (PHENERGAN) 25 MG suppository Place 1 suppository (25 mg total) rectally every 6 (six) hours as needed for nausea or vomiting. 12 suppository 1  . rosuvastatin (CRESTOR) 5 MG tablet Take 1 tablet by mouth daily as tolerated. Please make overdue appt with Dr. Radford Pax before anymore refills. Thank you 1st attempt 30 tablet 0  . carvedilol (COREG) 12.5 MG tablet Take one and one-half tablets by mouth twice daily with meals. 90 tablet 0   No facility-administered medications prior to visit.     EXAM:  BP 134/80 (BP Location: Left Arm, Patient Position: Sitting, Cuff Size: Normal)   Pulse 75   Temp 97.9 F (36.6 C) (Oral)   Ht 5' 1.5" (1.562 m)   Wt 126 lb 14.4 oz (57.6 kg)   SpO2 99%   BMI 23.59 kg/m   Body mass index is 23.59 kg/m.  GENERAL: vitals reviewed and listed above, alert, oriented, appears well hydrated and in no acute distress HEENT: atraumatic, conjunctiva  clear, no obvious abnormalities on inspection of external nose and ears OP : n masked NECK:  no obvious masses on inspection palpation left carotid bruit heard LUNGS: clear to auscultation bilaterally, no wheezes, rales or rhonchi, good air movement CV: HRRR, no clubbing cyanosis or  peripheral edema nl cap refill  Abdomen soft without organomegaly guarding or rebound MS: moves all extremities without noticeable focal  abnormality left bunion mild to moderate feet without ulcers or lesions obvious Skin large fading bruise on right forearm where she states the door hit her no lesions or break in skin: no central bruising or petechiae Neurologic grossly normal  normal gait and balance. PSYCH: pleasant and cooperative, no obvious depression or anxiety Lab Results  Component Value Date   WBC 5.1 01/12/2021   HGB 11.6 (L) 01/12/2021   HCT 33.8 (L) 01/12/2021   PLT 173.0 01/12/2021   GLUCOSE 133 (H) 01/12/2021   CHOL 125 07/03/2020   TRIG 126 07/03/2020   HDL 41 (L) 07/03/2020   LDLDIRECT 136.0 05/05/2016   LDLCALC 63 07/03/2020   ALT 9 08/19/2020   AST 16 08/19/2020   NA 140 01/12/2021   K 4.0 01/12/2021   CL 101 01/12/2021   CREATININE 0.98 01/12/2021   BUN 13 01/12/2021   CO2 31 01/12/2021   TSH 4.38 07/03/2020   INR 1.0 09/22/2016   HGBA1C 6.9 (H) 01/12/2021   MICROALBUR 2.6 07/03/2020   BP Readings from Last 3 Encounters:  01/12/21 134/80  07/15/20 136/76  11/25/19 (!) 182/73    ASSESSMENT AND PLAN:  Discussed the following assessment and plan:  Anemia, unspecified type - Plan: CBC with Differential/Platelet, Hemoglobin A1c, Vitamin Q75, Basic metabolic panel, IBC + Ferritin, IBC + Ferritin, CBC with Differential/Platelet, Hemoglobin A1c, Vitamin F16, Basic metabolic panel, CANCELED: IBC + Ferritin  Medication management - Plan: CBC with Differential/Platelet, Hemoglobin A1c, Vitamin B84, Basic metabolic panel, IBC + Ferritin, IBC + Ferritin, CBC with Differential/Platelet, Hemoglobin A1c, Vitamin Y65, Basic metabolic panel, CANCELED: IBC + Ferritin  Non-insulin  dependent type 2 diabetes mellitus (HCC) - Plan: CBC with Differential/Platelet, Hemoglobin A1c, Vitamin L93, Basic metabolic panel, IBC + Ferritin, IBC + Ferritin, CBC with Differential/Platelet, Hemoglobin A1c, Vitamin T70, Basic metabolic panel, CANCELED: IBC + Ferritin  History of stroke - Plan: CBC with Differential/Platelet, Hemoglobin A1c, Vitamin V77, Basic metabolic panel, IBC + Ferritin, IBC + Ferritin, CBC with Differential/Platelet, Hemoglobin A1c, Vitamin L39, Basic metabolic panel, CANCELED: IBC + Ferritin  Essential hypertension - Plan: CBC with Differential/Platelet, Hemoglobin A1c, Vitamin Q30, Basic metabolic panel, IBC + Ferritin, IBC + Ferritin, CBC with Differential/Platelet, Hemoglobin A1c, Vitamin S92, Basic metabolic panel, CANCELED: IBC + Ferritin  Bruising - on asa and plavix - Plan: CBC with Differential/Platelet, Hemoglobin A1c, Vitamin Z30, Basic metabolic panel, IBC + Ferritin, IBC + Ferritin, CBC with Differential/Platelet, Hemoglobin A1c, Vitamin Q76, Basic metabolic panel, CANCELED: IBC + Ferritin  Peripheral arterial disease (HCC) Follow-up anemia presumed iron deficiency borderline low B12 in past supplementing can try increasing the iron to 3 days a week or every other day to avoid GI side effects. No evidence of serious bleeding at this time bruises easy on external but exam reassuring. Updated lab today follow-up 6 months CPX or as indicated. -Patient advised to return or notify health care team  if  new concerns arise.  Patient Instructions  Will notify you  of labs when available.  Plan cpx  If all ok in 6 months     Standley Brooking. Claudine Stallings M.D.

## 2021-01-12 ENCOUNTER — Other Ambulatory Visit: Payer: Self-pay | Admitting: Cardiology

## 2021-01-12 ENCOUNTER — Other Ambulatory Visit: Payer: Self-pay

## 2021-01-12 ENCOUNTER — Ambulatory Visit: Payer: Medicare Other | Admitting: Internal Medicine

## 2021-01-12 ENCOUNTER — Encounter: Payer: Self-pay | Admitting: Internal Medicine

## 2021-01-12 VITALS — BP 134/80 | HR 75 | Temp 97.9°F | Ht 61.5 in | Wt 126.9 lb

## 2021-01-12 DIAGNOSIS — E119 Type 2 diabetes mellitus without complications: Secondary | ICD-10-CM

## 2021-01-12 DIAGNOSIS — I10 Essential (primary) hypertension: Secondary | ICD-10-CM

## 2021-01-12 DIAGNOSIS — D649 Anemia, unspecified: Secondary | ICD-10-CM | POA: Diagnosis not present

## 2021-01-12 DIAGNOSIS — I739 Peripheral vascular disease, unspecified: Secondary | ICD-10-CM

## 2021-01-12 DIAGNOSIS — Z79899 Other long term (current) drug therapy: Secondary | ICD-10-CM

## 2021-01-12 DIAGNOSIS — Z8673 Personal history of transient ischemic attack (TIA), and cerebral infarction without residual deficits: Secondary | ICD-10-CM | POA: Diagnosis not present

## 2021-01-12 DIAGNOSIS — T148XXA Other injury of unspecified body region, initial encounter: Secondary | ICD-10-CM

## 2021-01-12 LAB — VITAMIN B12: Vitamin B-12: 703 pg/mL (ref 211–911)

## 2021-01-12 LAB — BASIC METABOLIC PANEL
BUN: 13 mg/dL (ref 6–23)
CO2: 31 mEq/L (ref 19–32)
Calcium: 9.4 mg/dL (ref 8.4–10.5)
Chloride: 101 mEq/L (ref 96–112)
Creatinine, Ser: 0.98 mg/dL (ref 0.40–1.20)
GFR: 54.42 mL/min — ABNORMAL LOW (ref >60.00)
Glucose, Bld: 133 mg/dL — ABNORMAL HIGH (ref 70–99)
Potassium: 4 mEq/L (ref 3.5–5.1)
Sodium: 140 mEq/L (ref 135–145)

## 2021-01-12 LAB — CBC WITH DIFFERENTIAL/PLATELET
Basophils Absolute: 0 10*3/uL (ref 0.0–0.1)
Basophils Relative: 0.4 % (ref 0.0–3.0)
Eosinophils Absolute: 0.5 10*3/uL (ref 0.0–0.7)
Eosinophils Relative: 10.6 % — ABNORMAL HIGH (ref 0.0–5.0)
HCT: 33.8 % — ABNORMAL LOW (ref 36.0–46.0)
Hemoglobin: 11.6 g/dL — ABNORMAL LOW (ref 12.0–15.0)
Lymphocytes Relative: 13.7 % (ref 12.0–46.0)
Lymphs Abs: 0.7 10*3/uL (ref 0.7–4.0)
MCHC: 34.2 g/dL (ref 30.0–36.0)
MCV: 90 fl (ref 78.0–100.0)
Monocytes Absolute: 0.4 10*3/uL (ref 0.1–1.0)
Monocytes Relative: 7.4 % (ref 3.0–12.0)
Neutro Abs: 3.4 10*3/uL (ref 1.4–7.7)
Neutrophils Relative %: 67.9 % (ref 43.0–77.0)
Platelets: 173 10*3/uL (ref 150.0–400.0)
RBC: 3.75 Mil/uL — ABNORMAL LOW (ref 3.87–5.11)
RDW: 13.1 % (ref 11.5–15.5)
WBC: 5.1 10*3/uL (ref 4.0–10.5)

## 2021-01-12 LAB — IBC + FERRITIN
Ferritin: 42.6 ng/mL (ref 10.0–291.0)
Iron: 65 ug/dL (ref 42–145)
Saturation Ratios: 20.3 % (ref 20.0–50.0)
Transferrin: 229 mg/dL (ref 212.0–360.0)

## 2021-01-12 LAB — HEMOGLOBIN A1C: Hgb A1c MFr Bld: 6.9 % — ABNORMAL HIGH (ref 4.6–6.5)

## 2021-01-12 MED ORDER — CARVEDILOL 12.5 MG PO TABS: ORAL_TABLET | ORAL | 3 refills | Status: DC

## 2021-01-12 NOTE — Patient Instructions (Signed)
Will notify you  of labs when available.  Plan cpx  If all ok in 6 months

## 2021-01-12 NOTE — Progress Notes (Signed)
B12 is in good range blood count about the same A1c still acceptable although slightly higher than last time at 6.9  No change in plans.

## 2021-01-28 ENCOUNTER — Other Ambulatory Visit: Payer: Self-pay | Admitting: Internal Medicine

## 2021-02-03 ENCOUNTER — Other Ambulatory Visit: Payer: Self-pay | Admitting: Internal Medicine

## 2021-02-03 NOTE — Telephone Encounter (Signed)
Pharmacy is calling in stating that the pt is almost out of Rx metformin and would like to see if they can get it called in so they can get delivered to the pt.

## 2021-03-22 ENCOUNTER — Telehealth: Payer: Self-pay | Admitting: Internal Medicine

## 2021-03-22 NOTE — Telephone Encounter (Signed)
Left message for patient to call back and schedule Medicare Annual Wellness Visit (AWV) either virtually or in office.   Last AWV 10/11/16  ; please schedule at anytime with [facility] Nurse Health Advisor 1 or 2   This should be a 45 minute visit. 

## 2021-03-24 ENCOUNTER — Other Ambulatory Visit: Payer: Self-pay | Admitting: Cardiology

## 2021-03-26 ENCOUNTER — Other Ambulatory Visit: Payer: Self-pay | Admitting: Internal Medicine

## 2021-04-07 ENCOUNTER — Other Ambulatory Visit: Payer: Self-pay | Admitting: Cardiology

## 2021-04-20 ENCOUNTER — Other Ambulatory Visit: Payer: Self-pay | Admitting: Cardiology

## 2021-05-05 ENCOUNTER — Other Ambulatory Visit: Payer: Self-pay | Admitting: Cardiology

## 2021-05-11 ENCOUNTER — Telehealth: Payer: Self-pay | Admitting: Internal Medicine

## 2021-05-11 NOTE — Telephone Encounter (Signed)
Patient needs a refill on carvedilol (COREG) 12.5 MG tablet  [place], [place] - [address] Phone:  [phone]  Fax:  [phone]

## 2021-05-11 NOTE — Telephone Encounter (Addendum)
Pt has been informed that she still currently has refills left on rx. Pt stated she would contact her pharmacy in regards to her rx.

## 2021-05-19 ENCOUNTER — Other Ambulatory Visit: Payer: Self-pay | Admitting: Internal Medicine

## 2021-06-07 ENCOUNTER — Telehealth: Payer: Self-pay | Admitting: Internal Medicine

## 2021-06-07 NOTE — Telephone Encounter (Signed)
Left message for patient to call back and schedule Medicare Annual Wellness Visit (AWV) either virtually or in office.   Last AWV 10/11/16  please schedule at anytime with [facility] Nurse Health Advisor 1 or 2   This should be a 45 minute visit.      ----- Message -----  From: Philbert Riser  Sent: 05/11/2021   1:26 PM EDT  To: Darl Householder  Subject: RE: awv                                         This was a cpe, which was paid. She also did her chronic  care follow up which she got a copay for. She did not bill the Annual wellness and it is past the filing time that we can do that , would have to be within 90 days of visit for [facility] Medicare.    Thanks,  Dawn  ----- Message -----  From: Darl Householder  Sent: 05/11/2021   1:05 PM EDT  To: Philbert Riser  Subject: awv                                             I spoke with patient to schedule AWV.  She stated she already had one and she received a bill for visit.  Can you look @ 07/15/20 visit it has pt was there forawv and annual cpe.  Can this be refiled?

## 2021-06-08 LAB — HM DIABETES EYE EXAM

## 2021-06-09 ENCOUNTER — Encounter: Payer: Self-pay | Admitting: Internal Medicine

## 2021-06-12 ENCOUNTER — Other Ambulatory Visit: Payer: Self-pay | Admitting: Internal Medicine

## 2021-06-28 ENCOUNTER — Other Ambulatory Visit: Payer: Self-pay | Admitting: Cardiology

## 2021-06-29 MED ORDER — CLOPIDOGREL BISULFATE 75 MG PO TABS: 75.0000 mg | ORAL_TABLET | Freq: Every day | ORAL | 0 refills | Status: DC

## 2021-06-29 NOTE — Addendum Note (Signed)
Addended by: Juventino Slovak on: 06/29/2021 12:40 PM   Modules accepted: Orders

## 2021-07-20 ENCOUNTER — Other Ambulatory Visit: Payer: Self-pay

## 2021-07-20 NOTE — Progress Notes (Signed)
Chief Complaint  Patient presents with   Annual Exam     HPI: Patient  Sharon Wilson  80 y.o. comes in today for Preventive Health Care visit  And Chronic disease management  Bp     1 120  60 or so .  At home and feels doing well no chest pain shortness of breath BG seems to be doing well no neuropathy symptoms eye check had cataract surgery. Teams  cards   yearly  and to have card exam carotid soon.  She is to begin bowling on her bowling league soon states that her balance was never back to normal but still enjoys the social part of boiled bowling with a lighter ball.  8 pounds  She does get leg pain with exercise walking at some point claudication-like symptoms.  Health Maintenance  Topic Date Due   Zoster Vaccines- Shingrix (1 of 2) Never done   TETANUS/TDAP  08/22/2019   INFLUENZA VACCINE  06/07/2021   URINE MICROALBUMIN  07/03/2021   FOOT EXAM  07/15/2021   HEMOGLOBIN A1C  07/15/2021   COVID-19 Vaccine (3 - Pfizer risk series) 08/06/2021 (Originally 03/04/2020)   OPHTHALMOLOGY EXAM  06/08/2022   DEXA SCAN  Completed   PNA vac Low Risk Adult  Completed   HPV VACCINES  Aged Out   Health Maintenance Review LIFESTYLE:  Exercise:  bowling some walking Tobacco/ETS:n Alcohol: n Sugar beverages:n Sleep:ok 6-8  Drug use: no HH of 2 no pets   ROS:  REST of 12 system review negative except as per HPI   Past Medical History:  Diagnosis Date   Abnormal echocardiogram    a.07/2016 Echo: EF 50-55%, basal and mid inferolateral AK.   Agatston coronary artery calcium score greater than 400    a. 07/2016 Abnl Echo with basal and mid inferolateral AK, EF 50-55%; b. 09/01/2016 Ex MV: EF 46%, hypertensive response, no ST changes, apical thinning with very mild apical ischemia;  c. 09/21/2016 Cardiac CTA: Ca2+ score = 717, 90th percentile. Extensive CAD w/ mod stenosis in distal LM/ostial LAD, 50% RPLV-->rec Cath.   Anemia    hysterectomy   CAD (coronary artery disease)     a. 09/01/2016 Ex MV: EF 46%, hypertensive response, no ST changes, apical thinning with very mild apical ischemia;  c. 09/21/2016 Cardiac CTA: Ca2+ score = 717, 90th percentile. 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 07/2016 L facial droop-->Head CT: small area of low attenuation in the right frontoparietal region c/w acute or subacute lacunar infarct; 07/2016 MRI/A brain: acute infarct of right centrum semiovale and possibly posterior limb of right internal capsule. Mild to moderate multifocal narrowing of the MCA arteries w/o occlusion.   Type II diabetes mellitus ([place])    a. 07/2016 A1c 7.3.   White coat hypertension     Past Surgical History:  Procedure Laterality Date   ABDOMINAL HYSTERECTOMY     fibroid bleeding   CARDIAC CATHETERIZATION N/A 09/23/2016   Procedure: Left Heart Cath and Coronary Angiography;  Surgeon: Nelva Bush, MD;  Location: [facility];  Service: Cardiovascular;  Laterality: N/A;   CARDIAC CATHETERIZATION N/A 09/23/2016   Procedure: Intravascular Pressure Wire/FFR Study;  Surgeon: Nelva Bush, MD;  Location: [facility]  INVASIVE CV LAB;  Service: Cardiovascular;  Laterality: N/A;   carotid dopplers  11/11   per [facility] clinic   CATARACT EXTRACTION  2011   EAR CYST EXCISION Left 11/25/2019   Procedure: EXCISION LEFT EAR CANAL MASS;  Surgeon: Izora Gala, MD;  Location: [facility];  Service: ENT;  Laterality: Left;    Family History  Problem Relation Age of Onset   Cancer Mother        breast   Breast cancer Mother    CAD Neg Hx     Social History   Socioeconomic History   Marital status: Married    Spouse name: Not on file   Number of children: Not on file   Years of education: Not on file   Highest education  level: Not on file  Occupational History   Not on file  Tobacco Use   Smoking status: Never   Smokeless tobacco: Never  Vaping Use   Vaping Use: Never used  Substance and Sexual Activity   Alcohol use: No    Alcohol/week: 0.0 standard drinks   Drug use: No   Sexual activity: Not on file  Other Topics Concern   Not on file  Social History Narrative   Lives in [place] with husband.   Retired.   hh of 2   No pets   Social Determinants of Radio broadcast assistant Strain: Not on file  Food Insecurity: Not on file  Transportation Needs: Not on file  Physical Activity: Not on file  Stress: Not on file  Social Connections: Not on file    Outpatient Medications Prior to Visit  Medication Sig Dispense Refill   acetaminophen (TYLENOL) 325 MG tablet Take 2 tablets (650 mg total) by mouth every 4 (four) hours as needed for mild pain or moderate pain.     aspirin EC 81 MG tablet Take 1 tablet (81 mg total) by mouth daily.     Calcium Carbonate-Vitamin D (OSCAL 500/200 D-3 PO) Take 1 tablet by mouth 2 (two) times daily.      carvedilol (COREG) 12.5 MG tablet Take one and one-half tablets by mouth twice daily with meals. 270 tablet 3   clopidogrel (PLAVIX) 75 MG tablet Take 1 tablet (75 mg total) by mouth daily. Patient overdue for an appointment and needs to call and schedule for further refills2nd attempt 14 tablet 0   Cyanocobalamin (B-12 PO) Take by mouth.     ferrous sulfate 325 (65 FE) MG tablet Take 325 mg by mouth daily with breakfast.     HYDROcodone-acetaminophen (NORCO) 7.5-325 MG tablet Take 1 tablet by mouth every 6 (six) hours as needed for moderate pain. 20 tablet 0   isosorbide mononitrate (IMDUR) 30 MG 24 hr tablet Take 0.5 tablets (15 mg total) by mouth daily. Please make overdue appt with Dr. Radford Pax before anymore refills. Thank you 2nd attempt 8 tablet 0   metFORMIN (GLUCOPHAGE) 500 MG tablet TAKE ONE TABLET BY MOUTH IN THE MORNING AND TAKE TWO TABLETS IN THE EVENING 270  tablet 0   Multiple Vitamins-Minerals (ONE-A-DAY WOMENS 50+ PO) Take by mouth.     neomycin-polymyxin-hydrocortisone (CORTISPORIN) 3.5-10000-1 OTIC suspension Place 3 drops into the left ear daily. 10 mL 1   nitroGLYCERIN (NITROSTAT) 0.4 MG SL tablet Place 1 tablet (0.4 mg total) under the tongue every 5 (five) minutes x 3 doses as needed for chest pain. 25 tablet 12   promethazine (PHENERGAN) 25 MG suppository Place 1 suppository (25 mg  total) rectally every 6 (six) hours as needed for nausea or vomiting. 12 suppository 1   rosuvastatin (CRESTOR) 5 MG tablet Take 1 tablet by mouth daily if tolerated. Please make overdue appointment with Dr. Radford Pax before anymore refills. 15 tablet 0   No facility-administered medications prior to visit.     EXAM:  BP 130/70 (BP Location: Left Arm, Patient Position: Sitting, Cuff Size: Normal)   Pulse 65   Temp 98 F (36.7 C) (Oral)   Ht '5\' 2"'$  (1.575 m)   Wt 123 lb 6.4 oz (56 kg)   SpO2 98%   BMI 22.57 kg/m   Body mass index is 22.57 kg/m. Wt Readings from Last 3 Encounters:  07/21/21 123 lb 6.4 oz (56 kg)  01/12/21 126 lb 14.4 oz (57.6 kg)  07/15/20 121 lb 12.8 oz (55.2 kg)    Physical Exam: Vital signs reviewed [id number] is a well-developed well-nourished alert cooperative    who appearsr stated age in no acute distress.  HEENT: normocephalic atraumatic , Eyes:  EOM's full, conjunctiva clear,   Ears: no deformity EAC's clear TMs with normal landmarks. Mouth: masked  NECK: supple without masses, thyromegaly  CHEST/PULM:  Clear to auscultation and percussion breath sounds equal no wheeze , rales or rhonchi. No chest wall deformities or tenderness. Breast: normal by inspection . No dimpling, discharge, masses, tenderness or discharge . CV: PMI is nondisplaced, S1 S2 no gallops, murmurs, rubs. Peripheral pulses present ? Dec dp pulse left No JVD .  ABDOMEN: Bowel sounds normal nontender  No guard or rebound, no hepato splenomegal no CVA  tenderness. Bruise fading ruq no pain or hematoma  Extremtities:  No clubbing cyanosis or edema, no acute joint swelling or redness no focal atrophy mild djd changes    NEURO:  Oriented x3, cranial nerves 3-12 appear to be intact, no obvious focal weakness,gait wpretty nol mild wide based  looks balanced  SKIN: No acute rashes normal turgor, color, . PSYCH: Oriented, good eye contact, no obvious depression anxiety, cognition and judgment appear normal. LN: no cervical axillary inguinal adenopathy Diabetic Foot Exam - Simple   Simple Foot Form Visual Inspection No deformities, no ulcerations, no other skin breakdown bilaterally: Yes Sensation Testing Intact to touch and monofilament testing bilaterally: Yes Pulse Check Posterior Tibialis and Dorsalis pulse intact bilaterally: Yes See comments: Yes Comments Left DP  decrease  nl cap refill .      Lab Results  Component Value Date   WBC 5.1 01/12/2021   HGB 11.6 (L) 01/12/2021   HCT 33.8 (L) 01/12/2021   PLT 173.0 01/12/2021   GLUCOSE 133 (H) 01/12/2021   CHOL 125 07/03/2020   TRIG 126 07/03/2020   HDL 41 (L) 07/03/2020   LDLDIRECT 136.0 05/05/2016   LDLCALC 63 07/03/2020   ALT 9 08/19/2020   AST 16 08/19/2020   NA 140 01/12/2021   K 4.0 01/12/2021   CL 101 01/12/2021   CREATININE 0.98 01/12/2021   BUN 13 01/12/2021   CO2 31 01/12/2021   TSH 4.38 07/03/2020   INR 1.0 09/22/2016   HGBA1C 6.9 (H) 01/12/2021   MICROALBUR 2.6 07/03/2020    BP Readings from Last 3 Encounters:  07/21/21 130/70  01/12/21 134/80  07/15/20 136/76    Lab plan  reviewed with patient   ASSESSMENT AND PLAN:  Discussed the following assessment and plan:    ICD-10-CM   1. Visit for preventive health examination  Z00.00     2. Pure hypercholesterolemia  E78.00  Basic metabolic panel    CBC with Differential/Platelet    Hemoglobin A1c    Hepatic function panel    Lipid panel    TSH    Microalbumin / creatinine urine ratio    Vitamin B12     Vitamin B12    Microalbumin / creatinine urine ratio    TSH    Lipid panel    Hepatic function panel    Hemoglobin A1c    CBC with Differential/Platelet    Basic metabolic panel    3. Essential hypertension  99991111 Basic metabolic panel    CBC with Differential/Platelet    Hemoglobin A1c    Hepatic function panel    Lipid panel    TSH    Microalbumin / creatinine urine ratio    Vitamin B12    Vitamin B12    Microalbumin / creatinine urine ratio    TSH    Lipid panel    Hepatic function panel    Hemoglobin A1c    CBC with Differential/Platelet    Basic metabolic panel    4. Coronary artery disease involving native coronary artery of native heart without angina pectoris  0000000 Basic metabolic panel    CBC with Differential/Platelet    Hemoglobin A1c    Hepatic function panel    Lipid panel    TSH    Microalbumin / creatinine urine ratio    Vitamin B12    Vitamin B12    Microalbumin / creatinine urine ratio    TSH    Lipid panel    Hepatic function panel    Hemoglobin A1c    CBC with Differential/Platelet    Basic metabolic panel    5. History of stroke  [id number] Basic metabolic panel    CBC with Differential/Platelet    Hemoglobin A1c    Hepatic function panel    Lipid panel    TSH    Microalbumin / creatinine urine ratio    Vitamin B12    Vitamin B12    Microalbumin / creatinine urine ratio    TSH    Lipid panel    Hepatic function panel    Hemoglobin A1c    CBC with Differential/Platelet    Basic metabolic panel    6. Peripheral arterial disease (HCC)  [id number] Basic metabolic panel    CBC with Differential/Platelet    Hemoglobin A1c    Hepatic function panel    Lipid panel    TSH    Microalbumin / creatinine urine ratio    Vitamin B12    Vitamin B12    Microalbumin / creatinine urine ratio    TSH    Lipid panel    Hepatic function panel    Hemoglobin A1c    CBC with Differential/Platelet    Basic metabolic panel    7. Medication management   [id number] Basic metabolic panel    CBC with Differential/Platelet    Hemoglobin A1c    Hepatic function panel    Lipid panel    TSH    Microalbumin / creatinine urine ratio    Vitamin B12    Vitamin B12    Microalbumin / creatinine urine ratio    TSH    Lipid panel    Hepatic function panel    Hemoglobin A1c    CBC with Differential/Platelet    Basic metabolic panel    8. Atherosclerosis of native artery of both lower extremities with intermittent claudication (HCC)  99991111 Basic metabolic panel  CBC with Differential/Platelet    Hemoglobin A1c    Hepatic function panel    Lipid panel    TSH    Microalbumin / creatinine urine ratio    Vitamin B12    Vitamin B12    Microalbumin / creatinine urine ratio    TSH    Lipid panel    Hepatic function panel    Hemoglobin A1c    CBC with Differential/Platelet    Basic metabolic panel    9. Pain of lower extremity, unspecified laterality  M79.606    w exercise  claudication.    Conditions appear stable .  At this time   Bp seems controlled  Update lab today no neuropathy noted  leg pain may be vascular cause but stable  Has fu cards  yearly .  BG control  Flu vaccine in October .  6 mos fu  bg a1c check at that time  Return in about 6 months (around 01/18/2022) for depending on results.  Patient Care Team: Jemarcus Dougal, Standley Brooking, MD as PCP - General Sueanne Margarita, MD as PCP - Cardiology (Cardiology) Marygrace Drought, MD as Attending Physician (Ophthalmology) Milus Height, MD as Referring Physician (Ophthalmology) End, Harrell Gave, MD as Consulting Physician (Cardiology) Sueanne Margarita, MD as Consulting Physician (Cardiology) Izora Gala, MD as Consulting Physician (Otolaryngology) Patient Instructions  Good to see you today  Blood and urine tests Get flu vaccine in October .   Continue BP control .  Plan ROV in about 6 months depending on lab results   Standley Brooking. Eliakim Tendler M.D.

## 2021-07-21 ENCOUNTER — Ambulatory Visit (INDEPENDENT_AMBULATORY_CARE_PROVIDER_SITE_OTHER): Payer: Medicare Other | Admitting: Internal Medicine

## 2021-07-21 ENCOUNTER — Encounter: Payer: Self-pay | Admitting: Internal Medicine

## 2021-07-21 VITALS — BP 130/70 | HR 65 | Temp 98.0°F | Ht 62.0 in | Wt 123.4 lb

## 2021-07-21 DIAGNOSIS — I739 Peripheral vascular disease, unspecified: Secondary | ICD-10-CM

## 2021-07-21 DIAGNOSIS — E7800 Pure hypercholesterolemia, unspecified: Secondary | ICD-10-CM | POA: Diagnosis not present

## 2021-07-21 DIAGNOSIS — Z0000 Encounter for general adult medical examination without abnormal findings: Secondary | ICD-10-CM

## 2021-07-21 DIAGNOSIS — I70213 Atherosclerosis of native arteries of extremities with intermittent claudication, bilateral legs: Secondary | ICD-10-CM

## 2021-07-21 DIAGNOSIS — M79606 Pain in leg, unspecified: Secondary | ICD-10-CM

## 2021-07-21 DIAGNOSIS — I10 Essential (primary) hypertension: Secondary | ICD-10-CM | POA: Diagnosis not present

## 2021-07-21 DIAGNOSIS — Z8673 Personal history of transient ischemic attack (TIA), and cerebral infarction without residual deficits: Secondary | ICD-10-CM

## 2021-07-21 DIAGNOSIS — Z79899 Other long term (current) drug therapy: Secondary | ICD-10-CM

## 2021-07-21 DIAGNOSIS — I2510 Atherosclerotic heart disease of native coronary artery without angina pectoris: Secondary | ICD-10-CM | POA: Diagnosis not present

## 2021-07-21 LAB — HEPATIC FUNCTION PANEL
ALT: 12 U/L (ref 0–35)
AST: 20 U/L (ref 0–37)
Albumin: 4.4 g/dL (ref 3.5–5.2)
Alkaline Phosphatase: 50 U/L (ref 39–117)
Bilirubin, Direct: 0.2 mg/dL (ref 0.0–0.3)
Total Bilirubin: 1 mg/dL (ref 0.2–1.2)
Total Protein: 6.6 g/dL (ref 6.0–8.3)

## 2021-07-21 LAB — CBC WITH DIFFERENTIAL/PLATELET
Basophils Absolute: 0 10*3/uL (ref 0.0–0.1)
Basophils Relative: 0.6 % (ref 0.0–3.0)
Eosinophils Absolute: 0.5 10*3/uL (ref 0.0–0.7)
Eosinophils Relative: 9.3 % — ABNORMAL HIGH (ref 0.0–5.0)
HCT: 37.8 % (ref 36.0–46.0)
Hemoglobin: 12.7 g/dL (ref 12.0–15.0)
Lymphocytes Relative: 12.5 % (ref 12.0–46.0)
Lymphs Abs: 0.7 10*3/uL (ref 0.7–4.0)
MCHC: 33.6 g/dL (ref 30.0–36.0)
MCV: 91.8 fl (ref 78.0–100.0)
Monocytes Absolute: 0.4 10*3/uL (ref 0.1–1.0)
Monocytes Relative: 7.5 % (ref 3.0–12.0)
Neutro Abs: 3.9 10*3/uL (ref 1.4–7.7)
Neutrophils Relative %: 70.1 % (ref 43.0–77.0)
Platelets: 186 10*3/uL (ref 150.0–400.0)
RBC: 4.11 Mil/uL (ref 3.87–5.11)
RDW: 13.1 % (ref 11.5–15.5)
WBC: 5.6 10*3/uL (ref 4.0–10.5)

## 2021-07-21 LAB — LIPID PANEL
Cholesterol: 119 mg/dL (ref 0–200)
HDL: 43.9 mg/dL (ref >39.00)
LDL Cholesterol: 43 mg/dL (ref 0–99)
NonHDL: 74.86
Total CHOL/HDL Ratio: 3
Triglycerides: 161 mg/dL — ABNORMAL HIGH (ref 0.0–149.0)
VLDL: 32.2 mg/dL (ref 0.0–40.0)

## 2021-07-21 LAB — BASIC METABOLIC PANEL
BUN: 13 mg/dL (ref 6–23)
CO2: 29 mEq/L (ref 19–32)
Calcium: 9.5 mg/dL (ref 8.4–10.5)
Chloride: 97 mEq/L (ref 96–112)
Creatinine, Ser: 1.03 mg/dL (ref 0.40–1.20)
GFR: 51.08 mL/min — ABNORMAL LOW (ref >60.00)
Glucose, Bld: 115 mg/dL — ABNORMAL HIGH (ref 70–99)
Potassium: 4 mEq/L (ref 3.5–5.1)
Sodium: 135 mEq/L (ref 135–145)

## 2021-07-21 LAB — VITAMIN B12: Vitamin B-12: >1550 pg/mL — ABNORMAL HIGH (ref 211–911)

## 2021-07-21 LAB — TSH: TSH: 2.97 u[IU]/mL (ref 0.35–5.50)

## 2021-07-21 LAB — MICROALBUMIN / CREATININE URINE RATIO
Creatinine,U: 56 mg/dL
Microalb Creat Ratio: 1.3 mg/g (ref 0.0–30.0)
Microalb, Ur: <0.7 mg/dL (ref 0.0–1.9)

## 2021-07-21 NOTE — Patient Instructions (Addendum)
Good to see you today  Blood and urine tests Get flu vaccine in October .   Continue BP control .  Plan ROV in about 6 months depending on lab results

## 2021-07-22 LAB — HEMOGLOBIN A1C: Hgb A1c MFr Bld: 6.8 % — ABNORMAL HIGH (ref 4.6–6.5)

## 2021-07-25 NOTE — Progress Notes (Signed)
Stable labs except triglyceride slightly  attention to avoid simple sugars and carbs .  Will see you in 6 months . Hg a1c is 6.8

## 2021-08-12 ENCOUNTER — Other Ambulatory Visit: Payer: Self-pay

## 2021-08-12 ENCOUNTER — Ambulatory Visit (HOSPITAL_COMMUNITY)
Admission: RE | Admit: 2021-08-12 | Discharge: 2021-08-12 | Disposition: A | Discharge status: Home / Self Care | Payer: Medicare Other | Source: Ambulatory Visit | Attending: Cardiology | Admitting: Cardiology

## 2021-08-12 DIAGNOSIS — I771 Stricture of artery: Secondary | ICD-10-CM | POA: Insufficient documentation

## 2021-08-12 DIAGNOSIS — I6523 Occlusion and stenosis of bilateral carotid arteries: Secondary | ICD-10-CM | POA: Insufficient documentation

## 2021-08-17 ENCOUNTER — Telehealth: Payer: Self-pay

## 2021-08-17 DIAGNOSIS — I6523 Occlusion and stenosis of bilateral carotid arteries: Secondary | ICD-10-CM

## 2021-08-17 DIAGNOSIS — I771 Stricture of artery: Secondary | ICD-10-CM

## 2021-08-17 NOTE — Telephone Encounter (Signed)
The patient has been notified of the result and verbalized understanding.  All questions (if any) were answered. Antonieta Iba, RN 08/17/2021 4:34 PM  Referral has been placed.

## 2021-08-17 NOTE — Telephone Encounter (Signed)
-----   Message from Sueanne Margarita, MD sent at 08/16/2021  9:28 PM EDT ----- Carotid dopplers showed 40-59% bilateral carotid stenosis, < 50% common carotid artery stenosis and right subclavian artery stenosis. Please refer to Dr. Arita Miss with Vascular surgery to follow

## 2021-10-11 ENCOUNTER — Other Ambulatory Visit: Payer: Self-pay | Admitting: Cardiology

## 2021-10-29 ENCOUNTER — Other Ambulatory Visit: Payer: Self-pay | Admitting: Cardiology

## 2021-12-01 ENCOUNTER — Other Ambulatory Visit: Payer: Self-pay | Admitting: Cardiology

## 2021-12-16 ENCOUNTER — Ambulatory Visit (INDEPENDENT_AMBULATORY_CARE_PROVIDER_SITE_OTHER): Payer: Medicare Other

## 2021-12-16 VITALS — Ht 62.0 in | Wt 123.0 lb

## 2021-12-16 DIAGNOSIS — Z0000 Encounter for general adult medical examination without abnormal findings: Secondary | ICD-10-CM

## 2021-12-16 NOTE — Progress Notes (Signed)
Subjective:   Sharon Wilson is a 81 y.o. female who presents for Medicare Annual (Subsequent) preventive examination.  Review of Systems    Virtual Visit via Telephone Note  I connected with  Sharon Wilson on 12/16/21 at  1:00 PM EST by telephone and verified that I am speaking with the correct person using two identifiers.  Location: Patient: Home Provider: Office Persons participating in the virtual visit: patient/Nurse Health Advisor   I discussed the limitations, risks, security and privacy concerns of performing an evaluation and management service by telephone and the availability of in person appointments. The patient expressed understanding and agreed to proceed.  Interactive audio and video telecommunications were attempted between this nurse and patient, however failed, due to patient having technical difficulties OR patient did not have access to video capability.  We continued and completed visit with audio only.  Some vital signs may be absent or patient reported.   Criselda Peaches, LPN  Cardiac Risk Factors include: advanced age (>56men, >39 women);hypertension     Objective:    Today's Vitals   12/16/21 1257  Weight: 123 lb (55.8 kg)  Height: 5\' 2"  (1.575 m)   Body mass index is 22.5 kg/m.  Advanced Directives 12/16/2021 11/14/2018 10/01/2016 09/24/2016 09/23/2016 07/23/2016 07/20/2016  Does Patient Have a Medical Advance Directive? No No No No Yes No No  Does patient want to make changes to medical advance directive? - - No - Patient declined - No - Patient declined - -  Copy of [place] in Chart? - - - - No - copy requested - -  Would patient like information on creating a medical advance directive? No - Patient declined No - Patient declined - - - No - patient declined information No - patient declined information    Current Medications (verified) Outpatient Encounter Medications as of 12/16/2021  Medication Sig   acetaminophen  (TYLENOL) 325 MG tablet Take 2 tablets (650 mg total) by mouth every 4 (four) hours as needed for mild pain or moderate pain.   aspirin EC 81 MG tablet Take 1 tablet (81 mg total) by mouth daily.   Calcium Carbonate-Vitamin D (OSCAL 500/200 D-3 PO) Take 1 tablet by mouth 2 (two) times daily.    carvedilol (COREG) 12.5 MG tablet Take one and one-half tablets by mouth twice daily with meals.   clopidogrel (PLAVIX) 75 MG tablet Take 1 tablet (75 mg total) by mouth daily. Please make overdue appt with Dr. Radford Pax before anymore refills. Thank you 3rd and Final Attempt   Cyanocobalamin (B-12 PO) Take by mouth.   ferrous sulfate 325 (65 FE) MG tablet Take 325 mg by mouth daily with breakfast.   HYDROcodone-acetaminophen (NORCO) 7.5-325 MG tablet Take 1 tablet by mouth every 6 (six) hours as needed for moderate pain.   isosorbide mononitrate (IMDUR) 30 MG 24 hr tablet Take 0.5 tablets (15 mg total) by mouth daily. Please schedule an appointment with Dr. Radford Pax for future refills. Final attempt.   metFORMIN (GLUCOPHAGE) 500 MG tablet TAKE ONE TABLET BY MOUTH IN THE MORNING AND TAKE TWO TABLETS IN THE EVENING   Multiple Vitamins-Minerals (ONE-A-DAY WOMENS 50+ PO) Take by mouth.   neomycin-polymyxin-hydrocortisone (CORTISPORIN) 3.5-10000-1 OTIC suspension Place 3 drops into the left ear daily.   nitroGLYCERIN (NITROSTAT) 0.4 MG SL tablet Place 1 tablet (0.4 mg total) under the tongue every 5 (five) minutes x 3 doses as needed for chest pain.   promethazine (PHENERGAN) 25 MG suppository  Place 1 suppository (25 mg total) rectally every 6 (six) hours as needed for nausea or vomiting.   rosuvastatin (CRESTOR) 5 MG tablet Take 1 tablet by mouth daily if tolerated. Please make overdue appointment with Dr. Radford Pax before anymore refills. Last attempt   No facility-administered encounter medications on file as of 12/16/2021.    Allergies (verified) Ace inhibitors, Amlodipine besylate, and Lovastatin   History: Past  Medical History:  Diagnosis Date   Abnormal echocardiogram    a.07/2016 Echo: EF 50-55%, basal and mid inferolateral AK.   Agatston coronary artery calcium score greater than 400    a. 07/2016 Abnl Echo with basal and mid inferolateral AK, EF 50-55%; b. 09/01/2016 Ex MV: EF 46%, hypertensive response, no ST changes, apical thinning with very mild apical ischemia;  c. 09/21/2016 Cardiac CTA: Ca2+ score = 717, 90th percentile. Extensive CAD w/ mod stenosis in distal LM/ostial LAD, 50% RPLV-->rec Cath.   Anemia    hysterectomy   CAD (coronary artery disease)    a. 09/01/2016 Ex MV: EF 46%, hypertensive response, no ST changes, apical thinning with very mild apical ischemia;  c. 09/21/2016 Cardiac CTA: Ca2+ score = 717, 90th percentile. Extensive CAD w/ mod stenosis in distal LM/ostial LAD, 50% RPLV-->rec Cath   Carotid arterial disease w/ left carotid bruit (HCC)    bilateral 40-59% stenosis by dopplers 2018   Diverticulosis    Hyperlipidemia    OA (osteoarthritis)    Stroke (cerebrum) ([place])    a. 07/2016 L facial droop-->Head CT: small area of low attenuation in the right frontoparietal region c/w acute or subacute lacunar infarct; 07/2016 MRI/A brain: acute infarct of right centrum semiovale and possibly posterior limb of right internal capsule. Mild to moderate multifocal narrowing of the MCA arteries w/o occlusion.   Type II diabetes mellitus ([place])    a. 07/2016 A1c 7.3.   White coat hypertension    Past Surgical History:  Procedure Laterality Date   ABDOMINAL HYSTERECTOMY     fibroid bleeding   CARDIAC CATHETERIZATION N/A 09/23/2016   Procedure: Left Heart Cath and Coronary Angiography;  Surgeon: Nelva Bush, MD;  Location: [facility];  Service: Cardiovascular;  Laterality: N/A;   CARDIAC CATHETERIZATION N/A 09/23/2016   Procedure: Intravascular Pressure Wire/FFR Study;  Surgeon: Nelva Bush, MD;  Location: [facility];  Service: Cardiovascular;  Laterality: N/A;    carotid dopplers  11/11   per [facility] clinic   CATARACT EXTRACTION  2011   EAR CYST EXCISION Left 11/25/2019   Procedure: EXCISION LEFT EAR CANAL MASS;  Surgeon: Izora Gala, MD;  Location: [facility];  Service: ENT;  Laterality: Left;   Family History  Problem Relation Age of Onset   Cancer Mother        breast   Breast cancer Mother    CAD Neg Hx    Social History   Socioeconomic History   Marital status: Married    Spouse name: Not on file   Number of children: Not on file   Years of education: Not on file   Highest education level: Not on file  Occupational History   Not on file  Tobacco Use   Smoking status: Never   Smokeless tobacco: Never  Vaping Use   Vaping Use: Never used  Substance and Sexual Activity   Alcohol use: No    Alcohol/week: 0.0 standard drinks   Drug use: No   Sexual activity: Not on file  Other Topics Concern  Not on file  Social History Narrative   Lives in [place] with husband.   Retired.   hh of 2   No pets   Social Determinants of Radio broadcast assistant Strain: Low Risk    Difficulty of Paying Living Expenses: Not hard at all  Food Insecurity: No Food Insecurity   Worried About Charity fundraiser in the Last Year: Never true   Ran Out of Food in the Last Year: Never true  Transportation Needs: No Transportation Needs   Lack of Transportation (Medical): No   Lack of Transportation (Non-Medical): No  Physical Activity: Insufficiently Active   Days of Exercise per Week: 2 days   Minutes of Exercise per Session: 20 min  Stress: No Stress Concern Present   Feeling of Stress : Not at all  Social Connections: Socially Integrated   Frequency of Communication with Friends and Family: More than three times a week   Frequency of Social Gatherings with Friends and Family: More than three times a week   Attends Religious Services: More than 4 times per year   Active Member of [employer] or Organizations: Yes   Attends Programme researcher, broadcasting/film/video: More than 4 times per year   Marital Status: Married     Clinical Intake:  Pre-visit preparation completed: Yes  Pain : No/denies pain   BMI - recorded: 22.56 Nutritional Status: BMI of 19-24  Normal Nutritional Risks: None Diabetes: No  How often do you need to have someone help you when you read instructions, pamphlets, or other written materials from your doctor or pharmacy?: 1 - Never  Diabetic? No     Information entered by :: Rolene Arbour LPN   Activities of Daily Living In your present state of health, do you have any difficulty performing the following activities: 12/16/2021  Hearing? N  Vision? N  Difficulty concentrating or making decisions? N  Walking or climbing stairs? N  Dressing or bathing? N  Doing errands, shopping? N  Preparing Food and eating ? N  Using the Toilet? N  In the past six months, have you accidently leaked urine? N  Do you have problems with loss of bowel control? N  Managing your Medications? N  Managing your Finances? N  Housekeeping or managing your Housekeeping? N  Some recent data might be hidden    Patient Care Team: Panosh, Standley Brooking, MD as PCP - General Sueanne Margarita, MD as PCP - Cardiology (Cardiology) Marygrace Drought, MD as Attending Physician (Ophthalmology) Milus Height, MD as Referring Physician (Ophthalmology) End, Harrell Gave, MD as Consulting Physician (Cardiology) Sueanne Margarita, MD as Consulting Physician (Cardiology) Izora Gala, MD as Consulting Physician (Otolaryngology)  Indicate any recent Medical Services you may have received from other than [facility] providers in the past year (date may be approximate).     Assessment:   This is a routine wellness examination for Ladon.  Hearing/Vision screen Hearing Screening - Comments:: No difficulty hearing. Vision Screening - Comments:: Wears reading glasses. Followed by Dr Satira Sark  Dietary issues and exercise activities  discussed: Current Exercise Habits: Home exercise routine, Time (Minutes): 20, Frequency (Times/Week): 2, Weekly Exercise (Minutes/Week): 40, Intensity: Moderate, Exercise limited by: None identified   Goals Addressed             This Visit's Progress    Increase physical activity         Depression Screen PHQ 2/9 Scores 12/16/2021 07/21/2021 07/15/2020 10/11/2016 10/06/2015 08/05/2014 02/08/2013  PHQ - 2  Score 0 0 0 0 0 0 0  PHQ- 9 Score - 0 0 - - - -    Fall Risk Fall Risk  12/16/2021 01/12/2021 07/15/2020 07/25/2017 10/11/2016  Falls in the past year? 0 0 0 No No  Number falls in past yr: 0 0 - - -  Injury with Fall? 0 0 - - -  Risk for fall due to : No Fall Risks - - - -  Follow up - Falls evaluation completed - - -    FALL RISK PREVENTION PERTAINING TO THE HOME:  Any stairs in or around the home? No  If so, are there any without handrails? No  Home free of loose throw rugs in walkways, pet beds, electrical cords, etc? Yes  Adequate lighting in your home to reduce risk of falls? Yes   ASSISTIVE DEVICES UTILIZED TO PREVENT FALLS:  Life alert? No  Use of a cane, walker or w/c? No  Grab bars in the bathroom? No  Shower chair or bench in shower? No  Elevated toilet seat or a handicapped toilet? No   TIMED UP AND GO:  Was the test performed? No . Audio Visit Cognitive Function:     6CIT Screen 12/16/2021  What Year? 0 points  What month? 0 points  What time? 0 points  Count back from 20 0 points  Months in reverse 0 points  Repeat phrase 0 points  Total Score 0    Immunizations Immunization History  Administered Date(s) Administered   Fluad Quad(high Dose 65+) 09/23/2021   Influenza Split 11/15/2011, 08/24/2012   Influenza Whole 08/29/2007, 08/08/2008, 08/21/2009, 07/23/2010   Influenza, High Dose Seasonal PF 09/05/2014, 10/16/2015, 08/19/2016, 09/07/2018   PFIZER(Purple Top)SARS-COV-2 Vaccination 01/09/2020, 02/05/2020   Pneumococcal Conjugate-13 12/13/2013    Pneumococcal Polysaccharide-23 05/02/2007   Td 08/21/2009   Zoster, Live 02/20/2009    TDAP status: Due, Education has been provided regarding the importance of this vaccine. Advised may receive this vaccine at local pharmacy or Health Dept. Aware to provide a copy of the vaccination record if obtained from local pharmacy or Health Dept. Verbalized acceptance and understanding.  Flu Vaccine status: Up to date  Pneumococcal vaccine status: Up to date  Covid-19 vaccine status: Completed vaccines  Qualifies for Shingles Vaccine? Yes   Zostavax completed No   Shingrix Completed?: No.    Education has been provided regarding the importance of this vaccine. Patient has been advised to call insurance company to determine out of pocket expense if they have not yet received this vaccine. Advised may also receive vaccine at local pharmacy or Health Dept. Verbalized acceptance and understanding.  Screening Tests Health Maintenance  Topic Date Due   FOOT EXAM  07/15/2021   COVID-19 Vaccine (3 - Pfizer risk series) 01/01/2022 (Originally 03/04/2020)   Zoster Vaccines- Shingrix (1 of 2) 03/15/2022 (Originally 04/17/1960)   TETANUS/TDAP  12/16/2022 (Originally 08/22/2019)   HEMOGLOBIN A1C  01/18/2022   OPHTHALMOLOGY EXAM  06/08/2022   URINE MICROALBUMIN  07/21/2022   Pneumonia Vaccine 59+ Years old  Completed   INFLUENZA VACCINE  Completed   DEXA SCAN  Completed   HPV VACCINES  Aged Out    Health Maintenance  Health Maintenance Due  Topic Date Due   FOOT EXAM  07/15/2021    Colorectal cancer screening: No longer required.   Mammogram status: No longer required due to Age.  Bone Density status: Completed 08/13/14. Results reflect: Bone density results: OSTEOPOROSIS. Repeat every 2 years.  Lung Cancer Screening: (  Low Dose CT Chest recommended if Age 68-80 years, 30 pack-year currently smoking OR have quit w/in 15years.) does not qualify.   Additional Screening:  Hepatitis C Screening:  does not qualify; Completed   Vision Screening: Recommended annual ophthalmology exams for early detection of glaucoma and other disorders of the eye. Is the patient up to date with their annual eye exam?  Yes  Who is the provider or what is the name of the office in which the patient attends annual eye exams? Dr Satira Sark If pt is not established with a provider, would they like to be referred to a provider to establish care? No .   Dental Screening: Recommended annual dental exams for proper oral hygiene  Community Resource Referral / Chronic Care Management:  CRR required this visit?  No   CCM required this visit?  No      Plan:     I have personally reviewed and noted the following in the patients chart:   Medical and social history Use of alcohol, tobacco or illicit drugs  Current medications and supplements including opioid prescriptions. Patient currently taking opioids Functional ability and status Nutritional status Physical activity Advanced directives List of other physicians Hospitalizations, surgeries, and ER visits in previous 12 months Vitals Screenings to include cognitive, depression, and falls Referrals and appointments  In addition, I have reviewed and discussed with patient certain preventive protocols, quality metrics, and best practice recommendations. A written personalized care plan for preventive services as well as general preventive health recommendations were provided to patient.     Criselda Peaches, LPN   06/09/7289   Nurse Notes: None

## 2021-12-16 NOTE — Patient Instructions (Addendum)
Sharon Wilson , Thank you for taking time to come for your Medicare Wellness Visit. I appreciate your ongoing commitment to your health goals. Please review the following plan we discussed and let me know if I can assist you in the future.   These are the goals we discussed:  Goals      Increase physical activity        This is a list of the screening recommended for you and due dates:  Health Maintenance  Topic Date Due   Complete foot exam   07/15/2021   COVID-19 Vaccine (3 - Pfizer risk series) 01/01/2022*   Zoster (Shingles) Vaccine (1 of 2) 03/15/2022*   Tetanus Vaccine  12/16/2022*   Hemoglobin A1C  01/18/2022   Eye exam for diabetics  06/08/2022   Urine Protein Check  07/21/2022   Pneumonia Vaccine  Completed   Flu Shot  Completed   DEXA scan (bone density measurement)  Completed   HPV Vaccine  Aged Out  *Topic was postponed. The date shown is not the original due date.    Opioid Pain Medicine Management Opioids are powerful medicines that are used to treat moderate to severe pain. When used for short periods of time, they can help you to: Sleep better. Do better in physical or occupational therapy. Feel better in the first few days after an injury. Recover from surgery. Opioids should be taken with the supervision of a trained health care provider. They should be taken for the shortest period of time possible. This is because opioids can be addictive, and the longer you take opioids, the greater your risk of addiction. This addiction can also be called opioid use disorder. What are the risks? Using opioid pain medicines for longer than 3 days increases your risk of side effects. Side effects include: Constipation. Nausea and vomiting. Breathing difficulties (respiratory depression). Drowsiness. Confusion. Opioid use disorder. Itching. Taking opioid pain medicine for a long period of time can affect your ability to do daily tasks. It also puts you at risk  for: Motor vehicle crashes. Depression. Suicide. Heart attack. Overdose, which can be life-threatening. What is a pain treatment plan? A pain treatment plan is an agreement between you and your health care provider. Pain is unique to each person, and treatments vary depending on your condition. To manage your pain, you and your health care provider need to work together. To help you do this: Discuss the goals of your treatment, including how much pain you might expect to have and how you will manage the pain. Review the risks and benefits of taking opioid medicines. Remember that a good treatment plan uses more than one approach and minimizes the chance of side effects. Be honest about the amount of medicines you take and about any drug or alcohol use. Get pain medicine prescriptions from only one health care provider. Pain can be managed with many types of alternative treatments. Ask your health care provider to refer you to one or more specialists who can help you manage pain through: Physical or occupational therapy. Counseling (cognitive behavioral therapy). Good nutrition. Biofeedback. Massage. Meditation. Non-opioid medicine. Following a gentle exercise program. How to use opioid pain medicine Taking medicine Take your pain medicine exactly as told by your health care provider. Take it only when you need it. If your pain gets less severe, you may take less than your prescribed dose if your health care provider approves. If you are not having pain, do nottake pain medicine unless your health  care provider tells you to take it. If your pain is severe, do nottry to treat it yourself by taking more pills than instructed on your prescription. Contact your health care provider for help. Write down the times when you take your pain medicine. It is easy to become confused while on pain medicine. Writing the time can help you avoid overdose. Take other over-the-counter or prescription  medicines only as told by your health care provider. Keeping yourself and others safe  While you are taking opioid pain medicine: Do not drive, use machinery, or power tools. Do not sign legal documents. Do not drink alcohol. Do not take sleeping pills. Do not supervise children by yourself. Do not do activities that require climbing or being in high places. Do not go to a lake, river, ocean, spa, or swimming pool. Do not share your pain medicine with anyone. Keep pain medicine in a locked cabinet or in a secure area where pets and children cannot reach it. Stopping your use of opioids If you have been taking opioid medicine for more than a few weeks, you may need to slowly decrease (taper) how much you take until you stop completely. Tapering your use of opioids can decrease your risk of symptoms of withdrawal, such as: Pain and cramping in the abdomen. Nausea. Sweating. Sleepiness. Restlessness. Uncontrollable shaking (tremors). Cravings for the medicine. Do not attempt to taper your use of opioids on your own. Talk with your health care provider about how to do this. Your health care provider may prescribe a step-down schedule based on how much medicine you are taking and how long you have been taking it. Getting rid of leftover pills Do not save any leftover pills. Get rid of leftover pills safely by: Taking the medicine to a prescription take-back program. This is usually offered by the county or law enforcement. Bringing them to a pharmacy that has a drug disposal container. Flushing them down the toilet. Check the label or package insert of your medicine to see whether this is safe to do. Throwing them out in the trash. Check the label or package insert of your medicine to see whether this is safe to do. If it is safe to throw it out, remove the medicine from the original container, put it into a sealable bag or container, and mix it with used coffee grounds, food scraps, dirt, or  cat litter before putting it in the trash. Follow these instructions at home: Activity Do exercises as told by your health care provider. Avoid activities that make your pain worse. Return to your normal activities as told by your health care provider. Ask your health care provider what activities are safe for you. General instructions You may need to take these actions to prevent or treat constipation: Drink enough fluid to keep your urine pale yellow. Take over-the-counter or prescription medicines. Eat foods that are high in fiber, such as beans, whole grains, and fresh fruits and vegetables. Limit foods that are high in fat and processed sugars, such as fried or sweet foods. Keep all follow-up visits. This is important. Where to find support If you have been taking opioids for a long time, you may benefit from receiving support for quitting from a local support group or counselor. Ask your health care provider for a referral to these resources in your area. Where to find more information Centers for Disease Control and Prevention (CDC): http://www.wolf.info/ [place] Food and Drug Administration (FDA): [employer] Get help right away if: You may  have taken too much of an opioid (overdosed). Common symptoms of an overdose: Your breathing is slower or more shallow than normal. You have a very slow heartbeat (pulse). You have slurred speech. You have nausea and vomiting. Your pupils become very small. You have other potential symptoms: You are very confused. You faint or feel like you will faint. You have cold, clammy skin. You have blue lips or fingernails. You have thoughts of harming yourself or harming others. These symptoms may represent a serious problem that is an emergency. Do not wait to see if the symptoms will go away. Get medical help right away. Call your local emergency services (911 in the [place] Do not drive yourself to the hospital.  If you ever feel like you may hurt yourself or  others, or have thoughts about taking your own life, get help right away. Go to your nearest emergency department or: Call your local emergency services (911 in the [place] Call the [facility] (229)626-2980 in the [place] Call a suicide crisis helpline, such as the [place] at (702)266-9989 or 988 in the [place]. This is open 24 hours a day in the [place] Text the Crisis Text Line at 712 086 7421 (in the [place].). Summary Opioid medicines can help you manage moderate to severe pain for a short period of time. A pain treatment plan is an agreement between you and your health care provider. Discuss the goals of your treatment, including how much pain you might expect to have and how you will manage the pain. If you think that you or someone else may have taken too much of an opioid, get medical help right away. This information is not intended to replace advice given to you by your health care provider. Make sure you discuss any questions you have with your health care provider. Document Revised: 05/19/2021 Document Reviewed: 02/03/2021 Elsevier Patient Education  Alpha directives: No Patient deferred  Conditions/risks identified: Non  Next appointment: Follow up in one year for your annual wellness visit    Preventive Care 65 Years and Older, Female Preventive care refers to lifestyle choices and visits with your health care provider that can promote health and wellness. What does preventive care include? A yearly physical exam. This is also called an annual well check. Dental exams once or twice a year. Routine eye exams. Ask your health care provider how often you should have your eyes checked. Personal lifestyle choices, including: Daily care of your teeth and gums. Regular physical activity. Eating a healthy diet. Avoiding tobacco and drug use. Limiting alcohol use. Practicing safe sex. Taking low-dose aspirin every  day. Taking vitamin and mineral supplements as recommended by your health care provider. What happens during an annual well check? The services and screenings done by your health care provider during your annual well check will depend on your age, overall health, lifestyle risk factors, and family history of disease. Counseling  Your health care provider may ask you questions about your: Alcohol use. Tobacco use. Drug use. Emotional well-being. Home and relationship well-being. Sexual activity. Eating habits. History of falls. Memory and ability to understand (cognition). Work and work Statistician. Reproductive health. Screening  You may have the following tests or measurements: Height, weight, and BMI. Blood pressure. Lipid and cholesterol levels. These may be checked every 5 years, or more frequently if you are over 47 years old. Skin check. Lung cancer screening. You may have this screening every year starting at age  55 if you have a 30-pack-year history of smoking and currently smoke or have quit within the past 15 years. Fecal occult blood test (FOBT) of the stool. You may have this test every year starting at age 49. Flexible sigmoidoscopy or colonoscopy. You may have a sigmoidoscopy every 5 years or a colonoscopy every 10 years starting at age 71. Hepatitis C blood test. Hepatitis B blood test. Sexually transmitted disease (STD) testing. Diabetes screening. This is done by checking your blood sugar (glucose) after you have not eaten for a while (fasting). You may have this done every 1-3 years. Bone density scan. This is done to screen for osteoporosis. You may have this done starting at age 40. Mammogram. This may be done every 1-2 years. Talk to your health care provider about how often you should have regular mammograms. Talk with your health care provider about your test results, treatment options, and if necessary, the need for more tests. Vaccines  Your health care  provider may recommend certain vaccines, such as: Influenza vaccine. This is recommended every year. Tetanus, diphtheria, and acellular pertussis (Tdap, Td) vaccine. You may need a Td booster every 10 years. Zoster vaccine. You may need this after age 19. Pneumococcal 13-valent conjugate (PCV13) vaccine. One dose is recommended after age 51. Pneumococcal polysaccharide (PPSV23) vaccine. One dose is recommended after age 35. Talk to your health care provider about which screenings and vaccines you need and how often you need them. This information is not intended to replace advice given to you by your health care provider. Make sure you discuss any questions you have with your health care provider. Document Released: 11/20/2015 Document Revised: 07/13/2016 Document Reviewed: 08/25/2015 Elsevier Interactive Patient Education  2017 [place] Prevention in the Home Falls can cause injuries. They can happen to people of all ages. There are many things you can do to make your home safe and to help prevent falls. What can I do on the outside of my home? Regularly fix the edges of walkways and driveways and fix any cracks. Remove anything that might make you trip as you walk through a door, such as a raised step or threshold. Trim any bushes or trees on the path to your home. Use bright outdoor lighting. Clear any walking paths of anything that might make someone trip, such as rocks or tools. Regularly check to see if handrails are loose or broken. Make sure that both sides of any steps have handrails. Any raised decks and porches should have guardrails on the edges. Have any leaves, snow, or ice cleared regularly. Use sand or salt on walking paths during winter. Clean up any spills in your garage right away. This includes oil or grease spills. What can I do in the bathroom? Use night lights. Install grab bars by the toilet and in the tub and shower. Do not use towel bars as grab  bars. Use non-skid mats or decals in the tub or shower. If you need to sit down in the shower, use a plastic, non-slip stool. Keep the floor dry. Clean up any water that spills on the floor as soon as it happens. Remove soap buildup in the tub or shower regularly. Attach bath mats securely with double-sided non-slip rug tape. Do not have throw rugs and other things on the floor that can make you trip. What can I do in the bedroom? Use night lights. Make sure that you have a light by your bed that is easy to reach.  Do not use any sheets or blankets that are too big for your bed. They should not hang down onto the floor. Have a firm chair that has side arms. You can use this for support while you get dressed. Do not have throw rugs and other things on the floor that can make you trip. What can I do in the kitchen? Clean up any spills right away. Avoid walking on wet floors. Keep items that you use a lot in easy-to-reach places. If you need to reach something above you, use a strong step stool that has a grab bar. Keep electrical cords out of the way. Do not use floor polish or wax that makes floors slippery. If you must use wax, use non-skid floor wax. Do not have throw rugs and other things on the floor that can make you trip. What can I do with my stairs? Do not leave any items on the stairs. Make sure that there are handrails on both sides of the stairs and use them. Fix handrails that are broken or loose. Make sure that handrails are as long as the stairways. Check any carpeting to make sure that it is firmly attached to the stairs. Fix any carpet that is loose or worn. Avoid having throw rugs at the top or bottom of the stairs. If you do have throw rugs, attach them to the floor with carpet tape. Make sure that you have a light switch at the top of the stairs and the bottom of the stairs. If you do not have them, ask someone to add them for you. What else can I do to help prevent  falls? Wear shoes that: Do not have high heels. Have rubber bottoms. Are comfortable and fit you well. Are closed at the toe. Do not wear sandals. If you use a stepladder: Make sure that it is fully opened. Do not climb a closed stepladder. Make sure that both sides of the stepladder are locked into place. Ask someone to hold it for you, if possible. Clearly mark and make sure that you can see: Any grab bars or handrails. First and last steps. Where the edge of each step is. Use tools that help you move around (mobility aids) if they are needed. These include: Canes. Walkers. Scooters. Crutches. Turn on the lights when you go into a dark area. Replace any light bulbs as soon as they burn out. Set up your furniture so you have a clear path. Avoid moving your furniture around. If any of your floors are uneven, fix them. If there are any pets around you, be aware of where they are. Review your medicines with your doctor. Some medicines can make you feel dizzy. This can increase your chance of falling. Ask your doctor what other things that you can do to help prevent falls. This information is not intended to replace advice given to you by your health care provider. Make sure you discuss any questions you have with your health care provider. Document Released: 08/20/2009 Document Revised: 03/31/2016 Document Reviewed: 11/28/2014 Elsevier Interactive Patient Education  2017 [employer].

## 2021-12-24 ENCOUNTER — Other Ambulatory Visit: Payer: Self-pay | Admitting: Cardiology

## 2021-12-27 ENCOUNTER — Other Ambulatory Visit: Payer: Self-pay

## 2022-01-12 ENCOUNTER — Telehealth: Payer: Self-pay | Admitting: Cardiology

## 2022-01-12 MED ORDER — ISOSORBIDE MONONITRATE ER 30 MG PO TB24: 15.0000 mg | ORAL_TABLET | Freq: Every day | ORAL | 1 refills | Status: DC

## 2022-01-12 NOTE — Telephone Encounter (Signed)
?*  STAT* If patient is at the pharmacy, call can be transferred to refill team. ? ? ?1. Which medications need to be refilled? (please list name of each medication and dose if known)  ?isosorbide mononitrate (IMDUR) 30 MG 24 hr tablet ? ?2. Which pharmacy/location (including street and city if local pharmacy) is medication to be sent to? ?[place], [place] - [address] ? ?3. Do they need a 30 day or 90 day supply?  ? ?Patient is requesting enough medication to last her until 7/06 appointment with Dr. Radford Pax. ?

## 2022-01-12 NOTE — Telephone Encounter (Signed)
Pt's medication was sent to pt's pharmacy as requested. Confirmation received.  °

## 2022-01-18 ENCOUNTER — Other Ambulatory Visit: Payer: Self-pay | Admitting: Internal Medicine

## 2022-01-18 ENCOUNTER — Ambulatory Visit (INDEPENDENT_AMBULATORY_CARE_PROVIDER_SITE_OTHER): Payer: Medicare Other | Admitting: Internal Medicine

## 2022-01-18 ENCOUNTER — Other Ambulatory Visit: Payer: Self-pay

## 2022-01-18 ENCOUNTER — Encounter: Payer: Self-pay | Admitting: Internal Medicine

## 2022-01-18 VITALS — BP 160/80 | HR 68 | Temp 97.7°F | Ht 62.75 in | Wt 123.6 lb

## 2022-01-18 DIAGNOSIS — E1165 Type 2 diabetes mellitus with hyperglycemia: Secondary | ICD-10-CM | POA: Diagnosis not present

## 2022-01-18 DIAGNOSIS — I2510 Atherosclerotic heart disease of native coronary artery without angina pectoris: Secondary | ICD-10-CM

## 2022-01-18 DIAGNOSIS — Z79899 Other long term (current) drug therapy: Secondary | ICD-10-CM

## 2022-01-18 DIAGNOSIS — E7800 Pure hypercholesterolemia, unspecified: Secondary | ICD-10-CM

## 2022-01-18 DIAGNOSIS — Z8673 Personal history of transient ischemic attack (TIA), and cerebral infarction without residual deficits: Secondary | ICD-10-CM

## 2022-01-18 DIAGNOSIS — I10 Essential (primary) hypertension: Secondary | ICD-10-CM

## 2022-01-18 DIAGNOSIS — I739 Peripheral vascular disease, unspecified: Secondary | ICD-10-CM

## 2022-01-18 LAB — POCT GLYCOSYLATED HEMOGLOBIN (HGB A1C): Hemoglobin A1C: 6.6 % — AB (ref 4.0–5.6)

## 2022-01-18 NOTE — Progress Notes (Signed)
Chief Complaint  Patient presents with   Follow-up   Medication Management   Diabetes    HPI: Sharon Wilson 81 y.o. come in for  6 mos Chronic disease management    BP : At home  105/56   readings   and bothe her monitoros  correlate.  Takes   left  and ocass right readings Feeling fine  no cv pulm nuero sx   is over due for cards fu   was supposed to be contacted but wasn't so now has appt in [place] when needed refill medication  DM  : feels wont be as good   Eating cookies . Holiday habits. But doing ok   Activity :  bowling  no specific restriction.   ROS: See pertinent positives and negatives per HPI.  Past Medical History:  Diagnosis Date   Abnormal echocardiogram    a.07/2016 Echo: EF 50-55%, basal and mid inferolateral AK.   Agatston coronary artery calcium score greater than 400    a. 07/2016 Abnl Echo with basal and mid inferolateral AK, EF 50-55%; b. 09/01/2016 Ex MV: EF 46%, hypertensive response, no ST changes, apical thinning with very mild apical ischemia;  c. 09/21/2016 Cardiac CTA: Ca2+ score = 717, 90th percentile. Extensive CAD w/ mod stenosis in distal LM/ostial LAD, 50% RPLV-->rec Cath.   Anemia    hysterectomy   CAD (coronary artery disease)    a. 09/01/2016 Ex MV: EF 46%, hypertensive response, no ST changes, apical thinning with very mild apical ischemia;  c. 09/21/2016 Cardiac CTA: Ca2+ score = 717, 90th percentile. Extensive CAD w/ mod stenosis in distal LM/ostial LAD, 50% RPLV-->rec Cath   Carotid arterial disease w/ left carotid bruit (HCC)    bilateral 40-59% stenosis by dopplers 2018   Diverticulosis    Hyperlipidemia    OA (osteoarthritis)    Stroke (cerebrum) (HCC)    a. 07/2016 L facial droop-->Head CT: small area of low attenuation in the right frontoparietal region c/w acute or subacute lacunar infarct; 07/2016 MRI/A brain: acute infarct of right centrum semiovale and possibly posterior limb of right internal capsule. Mild to moderate  multifocal narrowing of the MCA arteries w/o occlusion.   Type II diabetes mellitus (HCC)    a. 07/2016 A1c 7.3.   White coat hypertension     Family History  Problem Relation Age of Onset   Cancer Mother        breast   Breast cancer Mother    CAD Neg Hx     Social History   Socioeconomic History   Marital status: Married    Spouse name: Not on file   Number of children: Not on file   Years of education: Not on file   Highest education level: Not on file  Occupational History   Not on file  Tobacco Use   Smoking status: Never   Smokeless tobacco: Never  Vaping Use   Vaping Use: Never used  Substance and Sexual Activity   Alcohol use: No    Alcohol/week: 0.0 standard drinks   Drug use: No   Sexual activity: Not on file  Other Topics Concern   Not on file  Social History Narrative   Lives in [place] with husband.   Retired.   hh of 2   No pets   Social Determinants of Health   Financial Resource Strain: Low Risk    Difficulty of Paying Living Expenses: Not hard at all  Food Insecurity: No Food Insecurity  Worried About Programme researcher, broadcasting/film/video in the Last Year: Never true   [employer] of Food in the Last Year: Never true  Transportation Needs: No Transportation Needs   Lack of Transportation (Medical): No   Lack of Transportation (Non-Medical): No  Physical Activity: Insufficiently Active   Days of Exercise per Week: 2 days   Minutes of Exercise per Session: 20 min  Stress: No Stress Concern Present   Feeling of Stress : Not at all  Social Connections: Socially Integrated   Frequency of Communication with Friends and Family: More than three times a week   Frequency of Social Gatherings with Friends and Family: More than three times a week   Attends Religious Services: More than 4 times per year   Active Member of [employer] or Organizations: Yes   Attends Engineer, structural: More than 4 times per year   Marital Status: Married    Outpatient Medications Prior  to Visit  Medication Sig Dispense Refill   acetaminophen (TYLENOL) 325 MG tablet Take 2 tablets (650 mg total) by mouth every 4 (four) hours as needed for mild pain or moderate pain.     aspirin EC 81 MG tablet Take 1 tablet (81 mg total) by mouth daily.     Calcium Carbonate-Vitamin D (OSCAL 500/200 D-3 PO) Take 1 tablet by mouth 2 (two) times daily.      carvedilol (COREG) 12.5 MG tablet Take one and one-half tablets by mouth twice daily with meals. 270 tablet 3   clopidogrel (PLAVIX) 75 MG tablet Take 1 tablet (75 mg total) by mouth daily. Please make overdue appt with Dr. Mayford Knife before anymore refills. Thank you 3rd and Final Attempt 15 tablet 0   Cyanocobalamin (B-12 PO) Take by mouth.     ferrous sulfate 325 (65 FE) MG tablet Take 325 mg by mouth daily with breakfast.     isosorbide mononitrate (IMDUR) 30 MG 24 hr tablet Take 0.5 tablets (15 mg total) by mouth daily. Please keep upcoming appointment with Dr. Mayford Knife in July 2023 before anymore refills. Thank you Final attempt. 45 tablet 1   metFORMIN (GLUCOPHAGE) 500 MG tablet TAKE ONE TABLET BY MOUTH IN THE MORNING AND TAKE TWO TABLETS IN THE EVENING 270 tablet 0   Multiple Vitamins-Minerals (ONE-A-DAY WOMENS 50+ PO) Take by mouth.     nitroGLYCERIN (NITROSTAT) 0.4 MG SL tablet Place 1 tablet (0.4 mg total) under the tongue every 5 (five) minutes x 3 doses as needed for chest pain. 25 tablet 12   rosuvastatin (CRESTOR) 5 MG tablet Take 1 tablet by mouth daily if tolerated. Please make overdue appointment with Dr. Mayford Knife before anymore refills. Last attempt 15 tablet 0   promethazine (PHENERGAN) 25 MG suppository Place 1 suppository (25 mg total) rectally every 6 (six) hours as needed for nausea or vomiting. (Patient not taking: Reported on 01/18/2022) 12 suppository 1   HYDROcodone-acetaminophen (NORCO) 7.5-325 MG tablet Take 1 tablet by mouth every 6 (six) hours as needed for moderate pain. (Patient not taking: Reported on 01/18/2022) 20 tablet 0    neomycin-polymyxin-hydrocortisone (CORTISPORIN) 3.5-10000-1 OTIC suspension Place 3 drops into the left ear daily. (Patient not taking: Reported on 01/18/2022) 10 mL 1   No facility-administered medications prior to visit.     EXAM:  BP (!) 160/80 (BP Location: Right Leg)   Pulse 68   Temp 97.7 F (36.5 C) (Oral)   Ht 5' 2.75" (1.594 m)   Wt 123 lb 9.6 oz (56.1 kg)  SpO2 100%   BMI 22.07 kg/m   Body mass index is 22.07 kg/m. Bp left 154/88 GENERAL: vitals reviewed and listed above, alert, oriented, appears well hydrated and in no acute distress HEENT: atraumatic, conjunctiva  clear, no obvious abnormalities on inspection of external nose and ears OP : masked  NECK: no obvious masses on inspection palpation   bruit heard  LUNGS: clear to auscultation bilaterally, no wheezes, rales or rhonchi, good air movement CV: HRRR, no clubbing cyanosis or  peripheral edema nl cap refill  MS: moves all extremities without noticeable focal  abnormality mild oa changes  PSYCH: pleasant and cooperative, no obvious depression or anxiety Lab Results  Component Value Date   WBC 5.6 07/21/2021   HGB 12.7 07/21/2021   HCT 37.8 07/21/2021   PLT 186.0 07/21/2021   GLUCOSE 115 (H) 07/21/2021   CHOL 119 07/21/2021   TRIG 161.0 (H) 07/21/2021   HDL 43.90 07/21/2021   LDLDIRECT 136.0 05/05/2016   LDLCALC 43 07/21/2021   ALT 12 07/21/2021   AST 20 07/21/2021   NA 135 07/21/2021   K 4.0 07/21/2021   CL 97 07/21/2021   CREATININE 1.03 07/21/2021   BUN 13 07/21/2021   CO2 29 07/21/2021   TSH 2.97 07/21/2021   INR 1.0 09/22/2016   HGBA1C 6.6 (A) 01/18/2022   MICROALBUR <0.7 07/21/2021   BP Readings from Last 3 Encounters:  01/18/22 (!) 160/80  07/21/21 130/70  01/12/21 134/80    ASSESSMENT AND PLAN:  Discussed the following assessment and plan:  Medication management - Plan: POC HgB A1c  Type 2 diabetes mellitus with hyperglycemia, without long-term current use of insulin (HCC) -  acceptable range  - Plan: POC HgB A1c  Essential  hypertesnsion with WC phenom  - reported home readings at goal  check both arms ocassionaly and bring in monitor to next visit   History of stroke - no recurrance of sx   -Patient advised to return or notify health care team  if  new concerns arise.  Patient Instructions  Good to see you today .  Please send in  5 days of  BP readings   twice a day sittings . And bring  monitor to a next visit .  Hg a1c  is 6.6  ok today ! Plan  6 mos cpx  lab at visit or pre visit   Tamilyn Lupien K. Jeannelle Wiens M.D.

## 2022-01-18 NOTE — Patient Instructions (Addendum)
Good to see you today . ? ?Please send in  5 days of  BP readings   twice a day sittings . And bring  monitor to a next visit . ? ?Hg a1c  is 6.6  ok today ! ?Plan  6 mos cpx  lab at visit or pre visit ?

## 2022-02-14 ENCOUNTER — Other Ambulatory Visit: Payer: Self-pay

## 2022-02-14 MED ORDER — ROSUVASTATIN CALCIUM 5 MG PO TABS: ORAL_TABLET | ORAL | 3 refills | Status: DC

## 2022-02-14 MED ORDER — ISOSORBIDE MONONITRATE ER 30 MG PO TB24: 15.0000 mg | ORAL_TABLET | Freq: Every day | ORAL | 3 refills | Status: DC

## 2022-02-14 MED ORDER — CLOPIDOGREL BISULFATE 75 MG PO TABS: 75.0000 mg | ORAL_TABLET | Freq: Every day | ORAL | 3 refills | Status: DC

## 2022-02-14 NOTE — Telephone Encounter (Signed)
Pt's medications were sent to pt's pharmacy as requested. Confirmation received.  

## 2022-05-12 ENCOUNTER — Ambulatory Visit: Payer: Medicare Other | Admitting: Cardiology

## 2022-05-21 NOTE — Progress Notes (Unsigned)
Office Visit    Patient Name: Sharon Wilson Date of Encounter: 05/25/2022  Primary Care Provider:  [name], MD Primary Cardiologist:  Fransico Him, MD Primary Electrophysiologist: None  Chief Complaint    Sharon Wilson is a 81 y.o. female with PMH of CAD s/p LHC 09/2016 with three-vessel disease treated medically, R brain CVA in 07/2016, DM, HLD, carotid artery disease and whitecoat HTN who presents today for overdue follow-up of coronary artery disease. Past Medical History    Past Medical History:  Diagnosis Date   Abnormal echocardiogram    a.07/2016 Echo: EF 50-55%, basal and mid inferolateral AK.   Agatston coronary artery calcium score greater than 400    a. 07/2016 Abnl Echo with basal and mid inferolateral AK, EF 50-55%; b. 09/01/2016 Ex MV: EF 46%, hypertensive response, no ST changes, apical thinning with very mild apical ischemia;  c. 09/21/2016 Cardiac CTA: Ca2+ score = 717, 90th percentile. Extensive CAD w/ mod stenosis in distal LM/ostial LAD, 50% RPLV-->rec Cath.   Anemia    hysterectomy   CAD (coronary artery disease)    a. 09/01/2016 Ex MV: EF 46%, hypertensive response, no ST changes, apical thinning with very mild apical ischemia;  c. 09/21/2016 Cardiac CTA: Ca2+ score = 717, 90th percentile. Extensive CAD w/ mod stenosis in distal LM/ostial LAD, 50% RPLV-->rec Cath   Carotid arterial disease w/ left carotid bruit (HCC)    bilateral 40-59% stenosis by dopplers 2018   Diverticulosis    Hyperlipidemia    OA (osteoarthritis)    Stroke (cerebrum) ([place])    a. 07/2016 L facial droop-->Head CT: small area of low attenuation in the right frontoparietal region c/w acute or subacute lacunar infarct; 07/2016 MRI/A brain: acute infarct of right centrum semiovale and possibly posterior limb of right internal capsule. Mild to moderate multifocal narrowing of the MCA arteries w/o occlusion.   Type II diabetes mellitus ([place])    a. 07/2016 A1c 7.3.   White coat  hypertension    Past Surgical History:  Procedure Laterality Date   ABDOMINAL HYSTERECTOMY     fibroid bleeding   CARDIAC CATHETERIZATION N/A 09/23/2016   Procedure: Left Heart Cath and Coronary Angiography;  Surgeon: Nelva Bush, MD;  Location: [facility];  Service: Cardiovascular;  Laterality: N/A;   CARDIAC CATHETERIZATION N/A 09/23/2016   Procedure: Intravascular Pressure Wire/FFR Study;  Surgeon: Nelva Bush, MD;  Location: [facility];  Service: Cardiovascular;  Laterality: N/A;   carotid dopplers  11/11   per HA clinic   CATARACT EXTRACTION  2011   EAR CYST EXCISION Left 11/25/2019   Procedure: EXCISION LEFT EAR CANAL MASS;  Surgeon: Izora Gala, MD;  Location: [facility];  Service: ENT;  Laterality: Left;    Allergies  Allergies  Allergen Reactions   Ace Inhibitors Hives, Swelling, Cough and Other (See Comments)   Amlodipine Besylate Other (See Comments)    Light headed and vertigo like  Also may have had hives.  Light headed and vertigo like  Also may have had hives.    Lovastatin Other (See Comments)    Leg cramps,  NO statins    History of Present Illness    Sharon Wilson is a 81 year old female with the above-mentioned past medical history who presents today for overdue annual follow-up of coronary artery disease.  She suffered CVA of the R brain in 9/17 and was treated with Plavix and statin therapy.  2D echo performed during  hospitalization following her CVA revealed normal LV function with basal and mid inferior lateral akinesis.  Cardiac CTA was performed and outpatient follow-up that revealed calcium score of 717 placing her at 90th percentile and 50% plaque in left main and ostial LAD.  She underwent LHC 09/23/2016 by Dr. Saunders Revel that demonstrated significant three-vessel CAD.  She was evaluated in OP by Dr. Servando Snare on 10/03/2016 who elected to treat medically due to lack of symptoms.  He did suggest that patient should undergo  regular stress testing due to likeliness of silent MI's in the past.  She was last seen by Dr. Radford Pax on 11/2019 for annual follow-up and preoperative evaluation for ear surgery. During visit patient was doing well from a cardiac standpoint and denied CP, SOB, DOE, PND at that time.  She was compliant with all of her medications and denied any side effects.  Since last being seen in the office patient reports that she has been feeling well and reports no complaint of angina or shortness of breath since her past visit.  She currently has been off her Plavix for 3 months and Imdur and is taking her rosuvastatin off and on at this time.  She is not physically active currently due to arthritic knee issues.  Her blood pressures today are elevated due to whitecoat hypertension that has been documented in the past.  She stated at home this morning her pressure was 124/60.  Patient denies chest pain, palpitations, dyspnea, PND, orthopnea, nausea, vomiting, dizziness, syncope, edema, weight gain, or early satiety.  Home Medications    Current Outpatient Medications  Medication Sig Dispense Refill   acetaminophen (TYLENOL) 325 MG tablet Take 2 tablets (650 mg total) by mouth every 4 (four) hours as needed for mild pain or moderate pain.     aspirin EC 81 MG tablet Take 1 tablet (81 mg total) by mouth daily.     Calcium Carbonate-Vitamin D (OSCAL 500/200 D-3 PO) Take 1 tablet by mouth 2 (two) times daily.      carvedilol (COREG) 12.5 MG tablet Take one and one-half tablets by mouth twice daily with meals. 270 tablet 3   Cyanocobalamin (B-12 PO) Take by mouth.     ferrous sulfate 325 (65 FE) MG tablet Take 325 mg by mouth daily with breakfast.     metFORMIN (GLUCOPHAGE) 500 MG tablet TAKE ONE TABLET BY MOUTH IN THE MORNING AND TAKE TWO TABLETS IN THE EVENING 270 tablet 0   Multiple Vitamins-Minerals (ONE-A-DAY WOMENS 50+ PO) Take by mouth.     nitroGLYCERIN (NITROSTAT) 0.4 MG SL tablet Place 1 tablet (0.4 mg  total) under the tongue every 5 (five) minutes x 3 doses as needed for chest pain. 25 tablet 12   promethazine (PHENERGAN) 25 MG suppository Place 1 suppository (25 mg total) rectally every 6 (six) hours as needed for nausea or vomiting. 12 suppository 1   clopidogrel (PLAVIX) 75 MG tablet Take 1 tablet (75 mg total) by mouth daily. 90 tablet 3   isosorbide mononitrate (IMDUR) 30 MG 24 hr tablet Take 0.5 tablets (15 mg total) by mouth daily. 45 tablet 3   rosuvastatin (CRESTOR) 5 MG tablet Take 1 tablet by mouth daily if tolerated. 90 tablet 3   No current facility-administered medications for this visit.     Review of Systems  Please see the history of present illness.    (+) Arthritic knee pain All other systems reviewed and are otherwise negative except as noted above.  Physical Exam  Wt Readings from Last 3 Encounters:  05/25/22 122 lb 6.4 oz (55.5 kg)  01/18/22 123 lb 9.6 oz (56.1 kg)  12/16/21 123 lb (55.8 kg)   VS: Vitals:   05/25/22 0941  BP: (!) 182/90  Pulse: 81  ,Body mass index is 23.13 kg/m.  Constitutional:      Appearance: Healthy appearance. Not in distress.  Neck:     Vascular: JVD normal.  Pulmonary:     Effort: Pulmonary effort is normal.     Breath sounds: No wheezing. No rales. Diminished in the bases Cardiovascular:     Normal rate. Regular rhythm. Normal S1. Normal S2.      Murmurs: There is no murmur.  Edema:    Peripheral edema absent.  Abdominal:     Palpations: Abdomen is soft non tender. There is no hepatomegaly.  Skin:    General: Skin is warm and dry.  Neurological:     General: No focal deficit present.     Mental Status: Alert and oriented to person, place and time.     Cranial Nerves: Cranial nerves are intact.  EKG/LABS/Other Studies Reviewed    ECG personally reviewed by me today -sinus rhythm with rate of 81 bpm with poor R wave progression and TWI noted in lead aVL that is unchanged from previous EKG.  Lab Results  Component  Value Date   WBC 5.6 07/21/2021   HGB 12.7 07/21/2021   HCT 37.8 07/21/2021   MCV 91.8 07/21/2021   PLT 186.0 07/21/2021   Lab Results  Component Value Date   CREATININE 1.03 07/21/2021   BUN 13 07/21/2021   NA 135 07/21/2021   K 4.0 07/21/2021   CL 97 07/21/2021   CO2 29 07/21/2021   Lab Results  Component Value Date   ALT 12 07/21/2021   AST 20 07/21/2021   ALKPHOS 50 07/21/2021   BILITOT 1.0 07/21/2021   Lab Results  Component Value Date   CHOL 119 07/21/2021   HDL 43.90 07/21/2021   LDLCALC 43 07/21/2021   LDLDIRECT 136.0 05/05/2016   TRIG 161.0 (H) 07/21/2021   CHOLHDL 3 07/21/2021    Lab Results  Component Value Date   HGBA1C 6.6 (A) 01/18/2022    Assessment & Plan    1.  Atherosclerotic CAD: -s/p LHC 09/2016 with three-vessel disease treated medically -Patient reports no angina or shortness of breath today or in the past 2 years Patient has been off Plavix for 3 months and Imdur due to missing follow-up. -Continue GDMT of ASA 81 mg, resume Plavix 75 mg, and Imdur 15 mg daily Crestor 5 mg -Discussed today at length the need to evaluate coronary anatomy for possible ischemia or silent MI.  Patient elected to decline follow-up testing with Lexiscan at this time.  2.  HLD: -Last LDL was 43 -Continue Crestor 5 mg daily -Continue low-sodium heart healthy diet  3.  Essential HTN: -Blood pressure today was elevated at 182/90 in the office and 124/60 at home -Continue carvedilol 12.5 mg twice daily and may consider increasing carvedilol if home blood pressures are elevated.  4.  Carotid artery stenosis: -Last surveillance carotid Doppler completed 08/2021 with no change noted from previous studies -Continue GDMT with Crestor and ASA with Plavix as noted above  5.  DM type II: -Followed by PCP continue current antidiabetic regimen  Disposition: Follow-up with Fransico Him, MD or APP in 6 months Shared Decision Making/Informed Consent The risks visit.,  benefits (risk stratification, diagnosing coronary artery disease,  treatment guidance) and alternatives of a nuclear stress test were discussed in detail with Sharon Wilson and she does not agree to proceed at this time.  Medication Adjustments/Labs and Tests Ordered: Current medicines are reviewed at length with the patient today.  Concerns regarding medicines are outlined above.   Signed, Mable Fill, Marissa Nestle, NP 05/25/2022, 10:19 AM [facility]

## 2022-05-25 ENCOUNTER — Encounter: Payer: Self-pay | Admitting: Nurse Practitioner

## 2022-05-25 ENCOUNTER — Ambulatory Visit (INDEPENDENT_AMBULATORY_CARE_PROVIDER_SITE_OTHER): Payer: Medicare Other | Admitting: Nurse Practitioner

## 2022-05-25 VITALS — BP 182/90 | HR 81 | Ht 61.0 in | Wt 122.4 lb

## 2022-05-25 DIAGNOSIS — I2510 Atherosclerotic heart disease of native coronary artery without angina pectoris: Secondary | ICD-10-CM | POA: Diagnosis not present

## 2022-05-25 DIAGNOSIS — E7800 Pure hypercholesterolemia, unspecified: Secondary | ICD-10-CM | POA: Diagnosis not present

## 2022-05-25 DIAGNOSIS — I6523 Occlusion and stenosis of bilateral carotid arteries: Secondary | ICD-10-CM

## 2022-05-25 DIAGNOSIS — E119 Type 2 diabetes mellitus without complications: Secondary | ICD-10-CM

## 2022-05-25 DIAGNOSIS — I10 Essential (primary) hypertension: Secondary | ICD-10-CM | POA: Diagnosis not present

## 2022-05-25 MED ORDER — ROSUVASTATIN CALCIUM 5 MG PO TABS: ORAL_TABLET | ORAL | 3 refills | Status: DC

## 2022-05-25 MED ORDER — ISOSORBIDE MONONITRATE ER 30 MG PO TB24: 15.0000 mg | ORAL_TABLET | Freq: Every day | ORAL | 3 refills | Status: DC

## 2022-05-25 MED ORDER — CLOPIDOGREL BISULFATE 75 MG PO TABS
75.0000 mg | ORAL_TABLET | Freq: Every day | ORAL | 3 refills | Status: DC
Start: 2022-05-25 — End: 2022-12-29

## 2022-05-25 NOTE — Patient Instructions (Signed)
Medication Instructions:  NO CHANGES  *If you need a refill on your cardiac medications before your next appointment, please call your pharmacy*   Lab Work: NONE If you have labs (blood work) drawn today and your tests are completely normal, you will receive your results only by: [place] (if you have MyChart) OR A paper copy in the mail If you have any lab test that is abnormal or we need to change your treatment, we will call you to review the results.   Testing/Procedures: NONE   Follow-Up: At [facility], you and your health needs are our priority.  As part of our continuing mission to provide you with exceptional heart care, we have created designated Provider Care Teams.  These Care Teams include your primary Cardiologist (physician) and Advanced Practice Providers (APPs -  Physician Assistants and Nurse Practitioners) who all work together to provide you with the care you need, when you need it.  We recommend signing up for the patient portal called "MyChart".  Sign up information is provided on this After Visit Summary.  MyChart is used to connect with patients for Virtual Visits (Telemedicine).  Patients are able to view lab/test results, encounter notes, upcoming appointments, etc.  Non-urgent messages can be sent to your provider as well.   To learn more about what you can do with MyChart, go to [email or web address].    Your next appointment:   6 month(s)  The format for your next appointment:   In Person  Provider:   Fransico Him, MD     Other Instructions NONE  Important Information About Sugar

## 2022-07-19 ENCOUNTER — Other Ambulatory Visit (INDEPENDENT_AMBULATORY_CARE_PROVIDER_SITE_OTHER): Payer: Medicare Other

## 2022-07-19 DIAGNOSIS — E7800 Pure hypercholesterolemia, unspecified: Secondary | ICD-10-CM

## 2022-07-19 DIAGNOSIS — E1165 Type 2 diabetes mellitus with hyperglycemia: Secondary | ICD-10-CM

## 2022-07-19 DIAGNOSIS — Z79899 Other long term (current) drug therapy: Secondary | ICD-10-CM

## 2022-07-19 DIAGNOSIS — I739 Peripheral vascular disease, unspecified: Secondary | ICD-10-CM

## 2022-07-19 DIAGNOSIS — I2510 Atherosclerotic heart disease of native coronary artery without angina pectoris: Secondary | ICD-10-CM

## 2022-07-19 DIAGNOSIS — I10 Essential (primary) hypertension: Secondary | ICD-10-CM | POA: Diagnosis not present

## 2022-07-19 LAB — CBC WITH DIFFERENTIAL/PLATELET
Basophils Absolute: 0 10*3/uL (ref 0.0–0.1)
Basophils Relative: 0.7 % (ref 0.0–3.0)
Eosinophils Absolute: 0.6 10*3/uL (ref 0.0–0.7)
Eosinophils Relative: 10.7 % — ABNORMAL HIGH (ref 0.0–5.0)
HCT: 36.6 % (ref 36.0–46.0)
Hemoglobin: 12.5 g/dL (ref 12.0–15.0)
Lymphocytes Relative: 17.7 % (ref 12.0–46.0)
Lymphs Abs: 0.9 10*3/uL (ref 0.7–4.0)
MCHC: 34.2 g/dL (ref 30.0–36.0)
MCV: 90.6 fl (ref 78.0–100.0)
Monocytes Absolute: 0.5 10*3/uL (ref 0.1–1.0)
Monocytes Relative: 9.1 % (ref 3.0–12.0)
Neutro Abs: 3.3 10*3/uL (ref 1.4–7.7)
Neutrophils Relative %: 61.8 % (ref 43.0–77.0)
Platelets: 181 10*3/uL (ref 150.0–400.0)
RBC: 4.04 Mil/uL (ref 3.87–5.11)
RDW: 13.5 % (ref 11.5–15.5)
WBC: 5.3 10*3/uL (ref 4.0–10.5)

## 2022-07-19 LAB — LIPID PANEL
Cholesterol: 109 mg/dL (ref 0–200)
HDL: 45.8 mg/dL (ref >39.00)
LDL Cholesterol: 43 mg/dL (ref 0–99)
NonHDL: 63.17
Total CHOL/HDL Ratio: 2
Triglycerides: 103 mg/dL (ref 0.0–149.0)
VLDL: 20.6 mg/dL (ref 0.0–40.0)

## 2022-07-19 LAB — BASIC METABOLIC PANEL
BUN: 13 mg/dL (ref 6–23)
CO2: 31 mEq/L (ref 19–32)
Calcium: 9.4 mg/dL (ref 8.4–10.5)
Chloride: 102 mEq/L (ref 96–112)
Creatinine, Ser: 1.01 mg/dL (ref 0.40–1.20)
GFR: 51.93 mL/min — ABNORMAL LOW (ref >60.00)
Glucose, Bld: 112 mg/dL — ABNORMAL HIGH (ref 70–99)
Potassium: 3.8 mEq/L (ref 3.5–5.1)
Sodium: 140 mEq/L (ref 135–145)

## 2022-07-19 LAB — HEPATIC FUNCTION PANEL
ALT: 11 U/L (ref 0–35)
AST: 21 U/L (ref 0–37)
Albumin: 4.2 g/dL (ref 3.5–5.2)
Alkaline Phosphatase: 54 U/L (ref 39–117)
Bilirubin, Direct: 0.2 mg/dL (ref 0.0–0.3)
Total Bilirubin: 0.8 mg/dL (ref 0.2–1.2)
Total Protein: 6.7 g/dL (ref 6.0–8.3)

## 2022-07-19 LAB — MICROALBUMIN / CREATININE URINE RATIO
Creatinine,U: 166.6 mg/dL
Microalb Creat Ratio: 1.4 mg/g (ref 0.0–30.0)
Microalb, Ur: 2.3 mg/dL — ABNORMAL HIGH (ref 0.0–1.9)

## 2022-07-19 LAB — HEMOGLOBIN A1C: Hgb A1c MFr Bld: 6.6 % — ABNORMAL HIGH (ref 4.6–6.5)

## 2022-07-20 LAB — HM DIABETES EYE EXAM

## 2022-07-20 NOTE — Progress Notes (Signed)
Results stable or at goal  Will review at u upcoming visit next week

## 2022-07-27 ENCOUNTER — Ambulatory Visit (INDEPENDENT_AMBULATORY_CARE_PROVIDER_SITE_OTHER): Payer: Medicare Other | Admitting: Internal Medicine

## 2022-07-27 ENCOUNTER — Encounter: Payer: Self-pay | Admitting: Internal Medicine

## 2022-07-27 VITALS — BP 158/80 | HR 90 | Temp 97.6°F | Ht 61.5 in | Wt 119.8 lb

## 2022-07-27 DIAGNOSIS — E785 Hyperlipidemia, unspecified: Secondary | ICD-10-CM | POA: Diagnosis not present

## 2022-07-27 DIAGNOSIS — I739 Peripheral vascular disease, unspecified: Secondary | ICD-10-CM

## 2022-07-27 DIAGNOSIS — Z79899 Other long term (current) drug therapy: Secondary | ICD-10-CM

## 2022-07-27 DIAGNOSIS — Z0000 Encounter for general adult medical examination without abnormal findings: Secondary | ICD-10-CM | POA: Diagnosis not present

## 2022-07-27 DIAGNOSIS — E119 Type 2 diabetes mellitus without complications: Secondary | ICD-10-CM | POA: Diagnosis not present

## 2022-07-27 DIAGNOSIS — I10 Essential (primary) hypertension: Secondary | ICD-10-CM

## 2022-07-27 DIAGNOSIS — I2510 Atherosclerotic heart disease of native coronary artery without angina pectoris: Secondary | ICD-10-CM

## 2022-07-27 MED ORDER — METFORMIN HCL 500 MG PO TABS: ORAL_TABLET | ORAL | 3 refills | Status: DC

## 2022-07-27 MED ORDER — CARVEDILOL 12.5 MG PO TABS: ORAL_TABLET | ORAL | 3 refills | Status: DC

## 2022-07-27 NOTE — Progress Notes (Unsigned)
No chief complaint on file.   HPI: Patient  Sharon Wilson  81 y.o. comes in today for Preventive Health Care visit   Health Maintenance  Topic Date Due   Zoster Vaccines- Shingrix (1 of 2) Never done   COVID-19 Vaccine (3 - Pfizer risk series) 03/04/2020   FOOT EXAM  07/15/2021   TETANUS/TDAP  12/16/2022 (Originally 08/22/2019)   INFLUENZA VACCINE  02/05/2023 (Originally 06/07/2022)   HEMOGLOBIN A1C  01/17/2023   Diabetic kidney evaluation - GFR measurement  07/20/2023   Diabetic kidney evaluation - Urine ACR  07/20/2023   OPHTHALMOLOGY EXAM  07/21/2023   Pneumonia Vaccine 36+ Years old  Completed   DEXA SCAN  Completed   HPV VACCINES  Aged Out   Health Maintenance Review LIFESTYLE:  Exercise:   Tobacco/ETS: Alcohol:  Sugar beverages: Sleep: Drug use: no HH of  Work:    ROS:  GEN/ HEENT: No fever, significant weight changes sweats headaches vision problems hearing changes, CV/ PULM; No chest pain shortness of breath cough, syncope,edema  change in exercise tolerance. GI /GU: No adominal pain, vomiting, change in bowel habits. No blood in the stool. No significant GU symptoms. SKIN/HEME: ,no acute skin rashes suspicious lesions or bleeding. No lymphadenopathy, nodules, masses.  NEURO/ PSYCH:  No neurologic signs such as weakness numbness. No depression anxiety. IMM/ Allergy: No unusual infections.  Allergy .   REST of 12 system review negative except as per HPI   Past Medical History:  Diagnosis Date   Abnormal echocardiogram    a.07/2016 Echo: EF 50-55%, basal and mid inferolateral AK.   Agatston coronary artery calcium score greater than 400    a. 07/2016 Abnl Echo with basal and mid inferolateral AK, EF 50-55%; b. 09/01/2016 Ex MV: EF 46%, hypertensive response, no ST changes, apical thinning with very mild apical ischemia;  c. 09/21/2016 Cardiac CTA: Ca2+ score = 717, 90th percentile. Extensive CAD w/ mod stenosis in distal LM/ostial LAD, 50% RPLV-->rec Cath.    Anemia    hysterectomy   CAD (coronary artery disease)    a. 09/01/2016 Ex MV: EF 46%, hypertensive response, no ST changes, apical thinning with very mild apical ischemia;  c. 09/21/2016 Cardiac CTA: Ca2+ score = 717, 90th percentile. Extensive CAD w/ mod stenosis in distal LM/ostial LAD, 50% RPLV-->rec Cath   Carotid arterial disease w/ left carotid bruit (HCC)    bilateral 40-59% stenosis by dopplers 2018   Diverticulosis    Hyperlipidemia    OA (osteoarthritis)    Stroke (cerebrum) ([place])    a. 07/2016 L facial droop-->Head CT: small area of low attenuation in the right frontoparietal region c/w acute or subacute lacunar infarct; 07/2016 MRI/A brain: acute infarct of right centrum semiovale and possibly posterior limb of right internal capsule. Mild to moderate multifocal narrowing of the MCA arteries w/o occlusion.   Type II diabetes mellitus ([place])    a. 07/2016 A1c 7.3.   White coat hypertension     Past Surgical History:  Procedure Laterality Date   ABDOMINAL HYSTERECTOMY     fibroid bleeding   CARDIAC CATHETERIZATION N/A 09/23/2016   Procedure: Left Heart Cath and Coronary Angiography;  Surgeon: Nelva Bush, MD;  Location: [facility];  Service: Cardiovascular;  Laterality: N/A;   CARDIAC CATHETERIZATION N/A 09/23/2016   Procedure: Intravascular Pressure Wire/FFR Study;  Surgeon: Nelva Bush, MD;  Location: [facility];  Service: Cardiovascular;  Laterality: N/A;   carotid dopplers  11/11   per [facility]  clinic   CATARACT EXTRACTION  2011   EAR CYST EXCISION Left 11/25/2019   Procedure: EXCISION LEFT EAR CANAL MASS;  Surgeon: Izora Gala, MD;  Location: [facility];  Service: ENT;  Laterality: Left;    Family History  Problem Relation Age of Onset   Cancer Mother        breast   Breast cancer Mother    CAD Neg Hx     Social History   Socioeconomic History   Marital status: Married    Spouse name: Not on file   Number of children: Not on  file   Years of education: Not on file   Highest education level: Not on file  Occupational History   Not on file  Tobacco Use   Smoking status: Never   Smokeless tobacco: Never  Vaping Use   Vaping Use: Never used  Substance and Sexual Activity   Alcohol use: No    Alcohol/week: 0.0 standard drinks of alcohol   Drug use: No   Sexual activity: Not on file  Other Topics Concern   Not on file  Social History Narrative   Lives in [place] with husband.   Retired.   hh of 2   No pets   Social Determinants of Health   Financial Resource Strain: Low Risk  (12/16/2021)   Overall Financial Resource Strain (CARDIA)    Difficulty of Paying Living Expenses: Not hard at all  Food Insecurity: No Food Insecurity (12/16/2021)   Hunger Vital Sign    Worried About Running Out of Food in the Last Year: Never true    Ran Out of Food in the Last Year: Never true  Transportation Needs: No Transportation Needs (12/16/2021)   PRAPARE - Hydrologist (Medical): No    Lack of Transportation (Non-Medical): No  Physical Activity: Insufficiently Active (12/16/2021)   Exercise Vital Sign    Days of Exercise per Week: 2 days    Minutes of Exercise per Session: 20 min  Stress: No Stress Concern Present (12/16/2021)   [place]    Feeling of Stress : Not at all  Social Connections: [place] (12/16/2021)   Social Connection and Isolation Panel [NHANES]    Frequency of Communication with Friends and Family: More than three times a week    Frequency of Social Gatherings with Friends and Family: More than three times a week    Attends Religious Services: More than 4 times per year    Active Member of [employer] or Organizations: Yes    Attends Music therapist: More than 4 times per year    Marital Status: Married    Outpatient Medications Prior to Visit  Medication Sig Dispense Refill   acetaminophen  (TYLENOL) 325 MG tablet Take 2 tablets (650 mg total) by mouth every 4 (four) hours as needed for mild pain or moderate pain.     aspirin EC 81 MG tablet Take 1 tablet (81 mg total) by mouth daily.     Calcium Carbonate-Vitamin D (OSCAL 500/200 D-3 PO) Take 1 tablet by mouth 2 (two) times daily.      carvedilol (COREG) 12.5 MG tablet Take one and one-half tablets by mouth twice daily with meals. 270 tablet 3   clopidogrel (PLAVIX) 75 MG tablet Take 1 tablet (75 mg total) by mouth daily. 90 tablet 3   Cyanocobalamin (B-12 PO) Take by mouth.     ferrous sulfate  325 (65 FE) MG tablet Take 325 mg by mouth daily with breakfast.     isosorbide mononitrate (IMDUR) 30 MG 24 hr tablet Take 0.5 tablets (15 mg total) by mouth daily. 45 tablet 3   metFORMIN (GLUCOPHAGE) 500 MG tablet TAKE ONE TABLET BY MOUTH IN THE MORNING AND TAKE TWO TABLETS IN THE EVENING 270 tablet 0   Multiple Vitamins-Minerals (ONE-A-DAY WOMENS 50+ PO) Take by mouth.     nitroGLYCERIN (NITROSTAT) 0.4 MG SL tablet Place 1 tablet (0.4 mg total) under the tongue every 5 (five) minutes x 3 doses as needed for chest pain. 25 tablet 12   rosuvastatin (CRESTOR) 5 MG tablet Take 1 tablet by mouth daily if tolerated. 90 tablet 3   promethazine (PHENERGAN) 25 MG suppository Place 1 suppository (25 mg total) rectally every 6 (six) hours as needed for nausea or vomiting. (Patient not taking: Reported on 07/27/2022) 12 suppository 1   No facility-administered medications prior to visit.     EXAM:  BP (!) 160/80 (BP Location: Left Arm, Patient Position: Sitting, Cuff Size: Normal)   Pulse 90   Temp 97.6 F (36.4 C) (Oral)   Ht 5' 1.5" (1.562 m)   Wt 119 lb 12.8 oz (54.3 kg)   SpO2 98%   BMI 22.27 kg/m   Body mass index is 22.27 kg/m. Wt Readings from Last 3 Encounters:  07/27/22 119 lb 12.8 oz (54.3 kg)  05/25/22 122 lb 6.4 oz (55.5 kg)  01/18/22 123 lb 9.6 oz (56.1 kg)    Physical Exam: Vital signs reviewed YPP:[id number] is a  well-developed well-nourished alert cooperative    who appearsr stated age in no acute distress.  HEENT: normocephalic atraumatic , Eyes: PERRL EOM's full, conjunctiva clear, Nares: paten,t no deformity discharge or tenderness., Ears: no deformity EAC's clear TMs with normal landmarks. Mouth: clear OP, no lesions, edema.  Moist mucous membranes. Dentition in adequate repair. NECK: supple without masses, thyromegaly or bruits. CHEST/PULM:  Clear to auscultation and percussion breath sounds equal no wheeze , rales or rhonchi. No chest wall deformities or tenderness. Breast: normal by inspection . No dimpling, discharge, masses, tenderness or discharge . CV: PMI is nondisplaced, S1 S2 no gallops, murmurs, rubs. Peripheral pulses are full without delay.No JVD .  ABDOMEN: Bowel sounds normal nontender  No guard or rebound, no hepato splenomegal no CVA tenderness.  No hernia. Extremtities:  No clubbing cyanosis or edema, no acute joint swelling or redness no focal atrophy NEURO:  Oriented x3, cranial nerves 3-12 appear to be intact, no obvious focal weakness,gait within normal limits no abnormal reflexes or asymmetrical SKIN: No acute rashes normal turgor, color, no bruising or petechiae. PSYCH: Oriented, good eye contact, no obvious depression anxiety, cognition and judgment appear normal. LN: no cervical axillary inguinal adenopathy  Lab Results  Component Value Date   WBC 5.3 07/19/2022   HGB 12.5 07/19/2022   HCT 36.6 07/19/2022   PLT 181.0 07/19/2022   GLUCOSE 112 (H) 07/19/2022   CHOL 109 07/19/2022   TRIG 103.0 07/19/2022   HDL 45.80 07/19/2022   LDLDIRECT 136.0 05/05/2016   LDLCALC 43 07/19/2022   ALT 11 07/19/2022   AST 21 07/19/2022   NA 140 07/19/2022   K 3.8 07/19/2022   CL 102 07/19/2022   CREATININE 1.01 07/19/2022   BUN 13 07/19/2022   CO2 31 07/19/2022   TSH 2.97 07/21/2021   INR 1.0 09/22/2016   HGBA1C 6.6 (H) 07/19/2022   MICROALBUR 2.3 (H) 07/19/2022  BP Readings  from Last 3 Encounters:  07/27/22 (!) 160/80  05/25/22 (!) 182/90  01/18/22 (!) 160/80    Lab results reviewed with patient   ASSESSMENT AND PLAN:  Discussed the following assessment and plan:    ICD-10-CM   1. Visit for preventive health examination  Z00.00     2. Essential hypertension  I10     3. Coronary artery disease involving native coronary artery of native heart without angina pectoris  I25.10     4. Non-insulin dependent type 2 diabetes mellitus ([place])  E11.9    at goal    5. Pure hypercholesterolemia  E78.00     6. Medication management  Z79.899     7. Hyperlipidemia, unspecified hyperlipidemia type  E78.5    ldl at goal     No follow-ups on file.  Patient Care Team: Shametra Cumberland, Standley Brooking, MD as PCP - General Sueanne Margarita, MD as PCP - Cardiology (Cardiology) Marygrace Drought, MD as Attending Physician (Ophthalmology) Milus Height, MD as Referring Physician (Ophthalmology) End, Harrell Gave, MD as Consulting Physician (Cardiology) Sueanne Margarita, MD as Consulting Physician (Cardiology) Izora Gala, MD as Consulting Physician (Otolaryngology) Patient Instructions  Good to see you today  Labs are at goal  Bp is up but as you reported stress and out of carvedilol Not sure what happened with the refills  Will refill the metformin for a year. Will refill the carvedilol to take 1/2 6.25 twice a day for 1-2 weeks and then take 1( 12.5 mg twice a day  ,  Can send in readings  after  on meds for 2-4 weeks .   Plan rov in 3-4 mos to make sure all ok with BP monitoring  and following.   Consider rsv vaccine this fall  .   Standley Brooking. Vernon Maish M.D.

## 2022-07-27 NOTE — Progress Notes (Signed)
Plan was noted for patient to return to see her retina specialist-Dr. Posey Pronto.

## 2022-07-27 NOTE — Patient Instructions (Addendum)
Good to see you today  Labs are at goal  Bp is up but as you reported stress and out of carvedilol Not sure what happened with the refills  Will refill the metformin for a year. Will refill the carvedilol to take 1/2 6.25 twice a day for 1-2 weeks and then take 1( 12.5 mg twice a day  , Bottle will say  take 1 twice a day ... without the ramp up. Can send in readings  after  on meds for 2-4 weeks .   Plan rov in 3-4 mos to make sure all ok with BP monitoring  and following.   Consider rsv vaccine this fall  .

## 2022-07-28 ENCOUNTER — Encounter: Payer: Self-pay | Admitting: Internal Medicine

## 2022-10-25 ENCOUNTER — Ambulatory Visit: Payer: Medicare Other | Admitting: Internal Medicine

## 2022-12-14 NOTE — Progress Notes (Unsigned)
No chief complaint on file.   HPI: Sharon Wilson 82 y.o. come in for Chronic disease management   Last visit 9 23  pv  intensified bp management with carvedilol rx   ROS: See pertinent positives and negatives per HPI.  Past Medical History:  Diagnosis Date   Abnormal echocardiogram    a.07/2016 Echo: EF 50-55%, basal and mid inferolateral AK.   Agatston coronary artery calcium score greater than 400    a. 07/2016 Abnl Echo with basal and mid inferolateral AK, EF 50-55%; b. 09/01/2016 Ex MV: EF 46%, hypertensive response, no ST changes, apical thinning with very mild apical ischemia;  c. 09/21/2016 Cardiac CTA: Ca2+ score = 717, 90th percentile. Extensive CAD w/ mod stenosis in distal LM/ostial LAD, 50% RPLV-->rec Cath.   Anemia    hysterectomy   CAD (coronary artery disease)    a. 09/01/2016 Ex MV: EF 46%, hypertensive response, no ST changes, apical thinning with very mild apical ischemia;  c. 09/21/2016 Cardiac CTA: Ca2+ score = 717, 90th percentile. Extensive CAD w/ mod stenosis in distal LM/ostial LAD, 50% RPLV-->rec Cath   Carotid arterial disease w/ left carotid bruit (HCC)    bilateral 40-59% stenosis by dopplers 2018   Diverticulosis    Hyperlipidemia    OA (osteoarthritis)    Stroke (cerebrum) ([place])    a. 07/2016 L facial droop-->Head CT: small area of low attenuation in the right frontoparietal region c/w acute or subacute lacunar infarct; 07/2016 MRI/A brain: acute infarct of right centrum semiovale and possibly posterior limb of right internal capsule. Mild to moderate multifocal narrowing of the MCA arteries w/o occlusion.   Type II diabetes mellitus ([place])    a. 07/2016 A1c 7.3.   White coat hypertension     Family History  Problem Relation Age of Onset   Cancer Mother        breast   Breast cancer Mother    CAD Neg Hx     Social History   Socioeconomic History   Marital status: Married    Spouse name: Not on file   Number of children: Not on file    Years of education: Not on file   Highest education level: Not on file  Occupational History   Not on file  Tobacco Use   Smoking status: Never   Smokeless tobacco: Never  Vaping Use   Vaping Use: Never used  Substance and Sexual Activity   Alcohol use: No    Alcohol/week: 0.0 standard drinks of alcohol   Drug use: No   Sexual activity: Not on file  Other Topics Concern   Not on file  Social History Narrative   Lives in [place] with husband.   Retired.   hh of 2   No pets   Social Determinants of Health   Financial Resource Strain: Low Risk  (12/16/2021)   Overall Financial Resource Strain (CARDIA)    Difficulty of Paying Living Expenses: Not hard at all  Food Insecurity: No Food Insecurity (12/16/2021)   Hunger Vital Sign    Worried About Running Out of Food in the Last Year: Never true    Ran Out of Food in the Last Year: Never true  Transportation Needs: No Transportation Needs (12/16/2021)   PRAPARE - Hydrologist (Medical): No    Lack of Transportation (Non-Medical): No  Physical Activity: Insufficiently Active (12/16/2021)   Exercise Vital Sign    Days of Exercise per Week: 2 days  Minutes of Exercise per Session: 20 min  Stress: No Stress Concern Present (12/16/2021)   [place]    Feeling of Stress : Not at all  Social Connections: [place] (12/16/2021)   Social Connection and Isolation Panel [NHANES]    Frequency of Communication with Friends and Family: More than three times a week    Frequency of Social Gatherings with Friends and Family: More than three times a week    Attends Religious Services: More than 4 times per year    Active Member of [employer] or Organizations: Yes    Attends Music therapist: More than 4 times per year    Marital Status: Married    Outpatient Medications Prior to Visit  Medication Sig Dispense Refill   acetaminophen (TYLENOL)  325 MG tablet Take 2 tablets (650 mg total) by mouth every 4 (four) hours as needed for mild pain or moderate pain.     aspirin EC 81 MG tablet Take 1 tablet (81 mg total) by mouth daily.     Calcium Carbonate-Vitamin D (OSCAL 500/200 D-3 PO) Take 1 tablet by mouth 2 (two) times daily.      carvedilol (COREG) 12.5 MG tablet Take one tablets by mouth twice daily with meals.or as directed 180 tablet 3   clopidogrel (PLAVIX) 75 MG tablet Take 1 tablet (75 mg total) by mouth daily. 90 tablet 3   Cyanocobalamin (B-12 PO) Take by mouth.     ferrous sulfate 325 (65 FE) MG tablet Take 325 mg by mouth daily with breakfast.     isosorbide mononitrate (IMDUR) 30 MG 24 hr tablet Take 0.5 tablets (15 mg total) by mouth daily. 45 tablet 3   metFORMIN (GLUCOPHAGE) 500 MG tablet TAKE ONE TABLET BY MOUTH IN THE MORNING AND TAKE TWO TABLETS IN THE EVENING 270 tablet 3   Multiple Vitamins-Minerals (ONE-A-DAY WOMENS 50+ PO) Take by mouth.     nitroGLYCERIN (NITROSTAT) 0.4 MG SL tablet Place 1 tablet (0.4 mg total) under the tongue every 5 (five) minutes x 3 doses as needed for chest pain. 25 tablet 12   rosuvastatin (CRESTOR) 5 MG tablet Take 1 tablet by mouth daily if tolerated. 90 tablet 3   No facility-administered medications prior to visit.     EXAM:  There were no vitals taken for this visit.  There is no height or weight on file to calculate BMI. Wt Readings from Last 3 Encounters:  07/27/22 119 lb 12.8 oz (54.3 kg)  05/25/22 122 lb 6.4 oz (55.5 kg)  01/18/22 123 lb 9.6 oz (56.1 kg)   BP Readings from Last 3 Encounters:  07/27/22 (!) 158/80  05/25/22 (!) 182/90  01/18/22 (!) 160/80    GENERAL: vitals reviewed and listed above, alert, oriented, appears well hydrated and in no acute distress HEENT: atraumatic, conjunctiva  clear, no obvious abnormalities on inspection of external nose and ears OP : no lesion edema or exudate  NECK: no obvious masses on inspection palpation  LUNGS: clear to  auscultation bilaterally, no wheezes, rales or rhonchi, good air movement CV: HRRR, no clubbing cyanosis or  peripheral edema nl cap refill  MS: moves all extremities without noticeable focal  abnormality PSYCH: pleasant and cooperative, no obvious depression or anxiety Lab Results  Component Value Date   WBC 5.3 07/19/2022   HGB 12.5 07/19/2022   HCT 36.6 07/19/2022   PLT 181.0 07/19/2022   GLUCOSE 112 (H) 07/19/2022   CHOL 109  07/19/2022   TRIG 103.0 07/19/2022   HDL 45.80 07/19/2022   LDLDIRECT 136.0 05/05/2016   LDLCALC 43 07/19/2022   ALT 11 07/19/2022   AST 21 07/19/2022   NA 140 07/19/2022   K 3.8 07/19/2022   CL 102 07/19/2022   CREATININE 1.01 07/19/2022   BUN 13 07/19/2022   CO2 31 07/19/2022   TSH 2.97 07/21/2021   INR 1.0 09/22/2016   HGBA1C 6.6 (H) 07/19/2022   MICROALBUR 2.3 (H) 07/19/2022   BP Readings from Last 3 Encounters:  07/27/22 (!) 158/80  05/25/22 (!) 182/90  01/18/22 (!) 160/80    ASSESSMENT AND PLAN:  Discussed the following assessment and plan:  No diagnosis found.  -Patient advised to return or notify health care team  if  new concerns arise.  There are no Patient Instructions on file for this visit.   Standley Brooking. Danahi Reddish M.D.

## 2022-12-15 ENCOUNTER — Encounter: Payer: Self-pay | Admitting: Internal Medicine

## 2022-12-15 ENCOUNTER — Ambulatory Visit: Payer: Medicare Other | Admitting: Internal Medicine

## 2022-12-15 VITALS — BP 146/94 | HR 87 | Temp 97.9°F | Ht 61.5 in | Wt 119.6 lb

## 2022-12-15 DIAGNOSIS — E785 Hyperlipidemia, unspecified: Secondary | ICD-10-CM

## 2022-12-15 DIAGNOSIS — Z8673 Personal history of transient ischemic attack (TIA), and cerebral infarction without residual deficits: Secondary | ICD-10-CM

## 2022-12-15 DIAGNOSIS — I739 Peripheral vascular disease, unspecified: Secondary | ICD-10-CM

## 2022-12-15 DIAGNOSIS — E1151 Type 2 diabetes mellitus with diabetic peripheral angiopathy without gangrene: Secondary | ICD-10-CM

## 2022-12-15 DIAGNOSIS — Z79899 Other long term (current) drug therapy: Secondary | ICD-10-CM

## 2022-12-15 DIAGNOSIS — I10 Essential (primary) hypertension: Secondary | ICD-10-CM | POA: Diagnosis not present

## 2022-12-15 DIAGNOSIS — I779 Disorder of arteries and arterioles, unspecified: Secondary | ICD-10-CM

## 2022-12-15 LAB — POCT GLYCOSYLATED HEMOGLOBIN (HGB A1C): Hemoglobin A1C: 6.5 % — AB (ref 4.0–5.6)

## 2022-12-15 MED ORDER — CARVEDILOL 12.5 MG PO TABS: ORAL_TABLET | ORAL | 3 refills | Status: DC

## 2022-12-15 MED ORDER — METFORMIN HCL 500 MG PO TABS: ORAL_TABLET | ORAL | 3 refills | Status: DC

## 2022-12-15 NOTE — Patient Instructions (Signed)
Good to see you today  A1c is 6.5 good . Will refill med again begin to take 1/2 of the carvedilol   a day to start ( 6.25 twice a day) . Of ok then stay on same dose .   Lets do a follow up in 3-4 months .   Will review record and decide on cards or vascular follow.

## 2022-12-16 ENCOUNTER — Telehealth: Payer: Self-pay | Admitting: Internal Medicine

## 2022-12-16 NOTE — Telephone Encounter (Signed)
Returned patients call L/m asking her to call [phone]   Patient left message 12/15/22

## 2022-12-21 NOTE — Telephone Encounter (Signed)
So I would like to refer her to vascular surgery Dr Trula Slade or colleagues  ;they are located I believe on [address]   and not at [facility] ( [facility]ology has an office there)  Please place order for vascular consult to above vascular team for dx carotid  and peripheral vascular disease ,with hx of stroke. Please inform patient about referral and let us know if dont get contacted about appt .

## 2022-12-29 ENCOUNTER — Other Ambulatory Visit: Payer: Self-pay

## 2022-12-29 MED ORDER — CLOPIDOGREL BISULFATE 75 MG PO TABS: 75.0000 mg | ORAL_TABLET | Freq: Every day | ORAL | 1 refills | Status: DC

## 2023-01-04 ENCOUNTER — Ambulatory Visit (INDEPENDENT_AMBULATORY_CARE_PROVIDER_SITE_OTHER): Payer: Medicare Other

## 2023-01-04 VITALS — Ht 61.5 in | Wt 119.0 lb

## 2023-01-04 DIAGNOSIS — Z0000 Encounter for general adult medical examination without abnormal findings: Secondary | ICD-10-CM | POA: Diagnosis not present

## 2023-01-04 NOTE — Progress Notes (Signed)
Subjective:   Sharon Wilson is a 82 y.o. female who presents for Medicare Annual (Subsequent) preventive examination.  Review of Systems    Virtual Visit via Telephone Note  I connected with  Sharon Wilson on 01/04/23 at  1:00 PM EST by telephone and verified that I am speaking with the correct person using two identifiers.  Location: Patient: Home Provider: Office Persons participating in the virtual visit: patient/Nurse Health Advisor   I discussed the limitations, risks, security and privacy concerns of performing an evaluation and management service by telephone and the availability of in person appointments. The patient expressed understanding and agreed to proceed.  Interactive audio and video telecommunications were attempted between this nurse and patient, however failed, due to patient having technical difficulties OR patient did not have access to video capability.  We continued and completed visit with audio only.  Some vital signs may be absent or patient reported.   Criselda Peaches, LPN  Cardiac Risk Factors include: advanced age (>37mn, >>60women);diabetes mellitus;hypertension     Objective:    Today's Vitals   01/04/23 1314  Weight: 119 lb (54 kg)  Height: 5' 1.5" (1.562 m)   Body mass index is 22.12 kg/m.     01/04/2023    1:21 PM 12/16/2021    1:07 PM 11/14/2018   11:50 AM 10/01/2016    9:45 AM 09/24/2016    2:33 PM 09/23/2016    1:45 PM 07/23/2016    1:59 AM  Advanced Directives  Does Patient Have a Medical Advance Directive? No No No No No Yes No  Does patient want to make changes to medical advance directive?    No - Patient declined  No - Patient declined   Copy of [place] Chart?      No - copy requested   Would patient like information on creating a medical advance directive? No - Patient declined No - Patient declined No - Patient declined    No - patient declined information    Current Medications  (verified) Outpatient Encounter Medications as of 01/04/2023  Medication Sig   acetaminophen (TYLENOL) 325 MG tablet Take 2 tablets (650 mg total) by mouth every 4 (four) hours as needed for mild pain or moderate pain.   aspirin EC 81 MG tablet Take 1 tablet (81 mg total) by mouth daily.   Calcium Carbonate-Vitamin D (OSCAL 500/200 D-3 PO) Take 1 tablet by mouth 2 (two) times daily.    carvedilol (COREG) 12.5 MG tablet Take one tablets by mouth twice daily with meals.or as directed   clopidogrel (PLAVIX) 75 MG tablet Take 1 tablet (75 mg total) by mouth daily.   Cyanocobalamin (B-12 PO) Take by mouth.   ferrous sulfate 325 (65 FE) MG tablet Take 325 mg by mouth daily with breakfast.   isosorbide mononitrate (IMDUR) 30 MG 24 hr tablet Take 0.5 tablets (15 mg total) by mouth daily. (Patient not taking: Reported on 12/15/2022)   metFORMIN (GLUCOPHAGE) 500 MG tablet TAKE ONE TABLET BY MOUTH IN THE MORNING AND TAKE TWO TABLETS IN THE EVENING   Multiple Vitamins-Minerals (ONE-A-DAY WOMENS 50+ PO) Take by mouth.   nitroGLYCERIN (NITROSTAT) 0.4 MG SL tablet Place 1 tablet (0.4 mg total) under the tongue every 5 (five) minutes x 3 doses as needed for chest pain.   rosuvastatin (CRESTOR) 5 MG tablet Take 1 tablet by mouth daily if tolerated.   No facility-administered encounter medications on file as of 01/04/2023.  Allergies (verified) Ace inhibitors, Amlodipine besylate, and Lovastatin   History: Past Medical History:  Diagnosis Date   Abnormal echocardiogram    a.07/2016 Echo: EF 50-55%, basal and mid inferolateral AK.   Agatston coronary artery calcium score greater than 400    a. 07/2016 Abnl Echo with basal and mid inferolateral AK, EF 50-55%; b. 09/01/2016 Ex MV: EF 46%, hypertensive response, no ST changes, apical thinning with very mild apical ischemia;  c. 09/21/2016 Cardiac CTA: Ca2+ score = 717, 90th percentile. Extensive CAD w/ mod stenosis in distal LM/ostial LAD, 50% RPLV-->rec Cath.    Anemia    hysterectomy   CAD (coronary artery disease)    a. 09/01/2016 Ex MV: EF 46%, hypertensive response, no ST changes, apical thinning with very mild apical ischemia;  c. 09/21/2016 Cardiac CTA: Ca2+ score = 717, 90th percentile. Extensive CAD w/ mod stenosis in distal LM/ostial LAD, 50% RPLV-->rec Cath   Carotid arterial disease w/ left carotid bruit (HCC)    bilateral 40-59% stenosis by dopplers 2018   Diverticulosis    Hyperlipidemia    OA (osteoarthritis)    Stroke (cerebrum) ([place])    a. 07/2016 L facial droop-->Head CT: small area of low attenuation in the right frontoparietal region c/w acute or subacute lacunar infarct; 07/2016 MRI/A brain: acute infarct of right centrum semiovale and possibly posterior limb of right internal capsule. Mild to moderate multifocal narrowing of the MCA arteries w/o occlusion.   Type II diabetes mellitus ([place])    a. 07/2016 A1c 7.3.   White coat hypertension    Past Surgical History:  Procedure Laterality Date   ABDOMINAL HYSTERECTOMY     fibroid bleeding   CARDIAC CATHETERIZATION N/A 09/23/2016   Procedure: Left Heart Cath and Coronary Angiography;  Surgeon: Nelva Bush, MD;  Location: [facility];  Service: Cardiovascular;  Laterality: N/A;   CARDIAC CATHETERIZATION N/A 09/23/2016   Procedure: Intravascular Pressure Wire/FFR Study;  Surgeon: Nelva Bush, MD;  Location: [facility];  Service: Cardiovascular;  Laterality: N/A;   carotid dopplers  11/11   per [facility] clinic   CATARACT EXTRACTION  2011   EAR CYST EXCISION Left 11/25/2019   Procedure: EXCISION LEFT EAR CANAL MASS;  Surgeon: Izora Gala, MD;  Location: [facility];  Service: ENT;  Laterality: Left;   Family History  Problem Relation Age of Onset   Cancer Mother        breast   Breast cancer Mother    CAD Neg Hx    Social History   Socioeconomic History   Marital status: Married    Spouse name: Not on file   Number of children: Not on file    Years of education: Not on file   Highest education level: Not on file  Occupational History   Not on file  Tobacco Use   Smoking status: Never   Smokeless tobacco: Never  Vaping Use   Vaping Use: Never used  Substance and Sexual Activity   Alcohol use: No    Alcohol/week: 0.0 standard drinks of alcohol   Drug use: No   Sexual activity: Not on file  Other Topics Concern   Not on file  Social History Narrative   Lives in [place] with [place].   Retired.   hh of 2   No pets   Social Determinants of Health   Financial Resource Strain: Low Risk  (01/04/2023)   Overall Financial Resource Strain (CARDIA)    Difficulty of Paying Living Expenses:  Not hard at all  Food Insecurity: No Food Insecurity (01/04/2023)   Hunger Vital Sign    Worried About Running Out of Food in the Last Year: Never true    Ran Out of Food in the Last Year: Never true  Transportation Needs: No Transportation Needs (01/04/2023)   PRAPARE - Hydrologist (Medical): No    Lack of Transportation (Non-Medical): No  Physical Activity: Sufficiently Active (01/04/2023)   Exercise Vital Sign    Days of Exercise per Week: 1 day    Minutes of Exercise per Session: 150+ min  Stress: No Stress Concern Present (01/04/2023)   [place]    Feeling of Stress : Not at all  Social Connections: [place] (01/04/2023)   Social Connection and Isolation Panel [NHANES]    Frequency of Communication with Friends and Family: More than three times a week    Frequency of Social Gatherings with Friends and Family: More than three times a week    Attends Religious Services: More than 4 times per year    Active Member of [employer] or Organizations: Yes    Attends Music therapist: More than 4 times per year    Marital Status: Married    Tobacco Counseling Counseling given: Not Answered   Clinical Intake:  Pre-visit  preparation completed: Yes  Pain : No/denies painNutrition Risk Assessment:  Has the patient had any N/V/D within the last 2 months?  No  Does the patient have any non-healing wounds?  No  Has the patient had any unintentional weight loss or weight gain?  No   Diabetes:  Is the patient diabetic?  Yes  If diabetic, was a CBG obtained today?  No  Did the patient bring in their glucometer from home?  No  How often do you monitor your CBG's? Daily.   Financial Strains and Diabetes Management:  Are you having any financial strains with the device, your supplies or your medication? No .  Does the patient want to be seen by Chronic Care Management for management of their diabetes?  No  Would the patient like to be referred to a Nutritionist or for Diabetic Management?  No   Diabetic Exams:  Diabetic Eye Exam: Completed No. Overdue for diabetic eye exam. Pt has been advised about the importance in completing this exam. A referral has been placed today. Message sent to referral coordinator for scheduling purposes. Advised pt to expect a call from office referred to regarding appt.  Diabetic Foot Exam: Completed No. Pt has been advised about the importance in completing this exam. Pt is scheduled for diabetic foot exam on Followed by PCP.       BMI - recorded: 22.12 Nutritional Status: BMI of 19-24  Normal Nutritional Risks: None Diabetes: Yes CBG done?: No Did pt. bring in CBG monitor from home?: No  How often do you need to have someone help you when you read instructions, pamphlets, or other written materials from your doctor or pharmacy?: 1 - Never  Diabetic?  Yes  Interpreter Needed?: No  Information entered by :: Rolene Arbour LPN   Activities of Daily Living    01/04/2023    1:19 PM  In your present state of health, do you have any difficulty performing the following activities:  Hearing? 0  Vision? 0  Difficulty concentrating or making decisions? 0  Walking or  climbing stairs? 0  Dressing or bathing? 0  Doing errands, shopping? 0  Preparing Food and eating ? N  Using the Toilet? N  In the past six months, have you accidently leaked urine? N  Do you have problems with loss of bowel control? N  Managing your Medications? N  Managing your Finances? N  Housekeeping or managing your Housekeeping? N    Patient Care Team: Panosh, Standley Brooking, MD as PCP - General Sueanne Margarita, MD as PCP - Cardiology (Cardiology) Marygrace Drought, MD as Attending Physician (Ophthalmology) Milus Height, MD as Referring Physician (Ophthalmology) End, Harrell Gave, MD as Consulting Physician (Cardiology) Sueanne Margarita, MD as Consulting Physician (Cardiology) Izora Gala, MD as Consulting Physician (Otolaryngology)  Indicate any recent Medical Services you may have received from other than [facility] providers in the past year (date may be approximate).     Assessment:   This is a routine wellness examination for Sharon Wilson.  Hearing/Vision screen Hearing Screening - Comments:: Denies hearing difficulties   Vision Screening - Comments:: Wears rx glasses - up to date with routine eye exams with  Dr Satira Sark  Dietary issues and exercise activities discussed: Exercise limited by: None identified   Goals Addressed               This Visit's Progress     Stay Healthy (pt-stated)         Depression Screen    01/04/2023    1:17 PM 12/15/2022    9:44 AM 07/27/2022   11:03 AM 01/18/2022    9:08 AM 12/16/2021    1:03 PM 07/21/2021   10:27 AM 07/15/2020   11:21 AM  PHQ 2/9 Scores  PHQ - 2 Score 0 0 0 0 0 0 0  PHQ- 9 Score 0 0 0 0  0 0    Fall Risk    01/04/2023    1:20 PM 12/15/2022    9:44 AM 07/27/2022   11:03 AM 01/18/2022    9:07 AM 12/16/2021    1:06 PM  Fall Risk   Falls in the past year? 0 0 0 0 0  Number falls in past yr: 0 0 0 0 0  Injury with Fall? 0 0 0 0 0  Risk for fall due to : No Fall Risks No Fall Risks No Fall Risks No Fall Risks No Fall Risks   Follow up Falls prevention discussed Falls evaluation completed Falls evaluation completed Falls evaluation completed     FALL RISK PREVENTION PERTAINING TO THE HOME:  Any stairs in or around the home? Yes  If so, are there any without handrails? No  Home free of loose throw rugs in walkways, pet beds, electrical cords, etc? Yes  Adequate lighting in your home to reduce risk of falls? Yes   ASSISTIVE DEVICES UTILIZED TO PREVENT FALLS:  Life alert? No Use of a cane, walker or w/c? No Grab bars in the bathroom? No Shower chair or bench in shower? No Elevated toilet seat or a handicapped toilet? No   TIMED UP AND GO:  Was the test performed? No . Audio Visit    Cognitive Function:        01/04/2023    1:21 PM 12/16/2021    1:08 PM  6CIT Screen  What Year? 0 points 0 points  What month? 0 points 0 points  What time? 0 points 0 points  Count back from 20 0 points 0 points  Months in reverse 0 points 0 points  Repeat phrase 0 points 0 points  Total Score  0 points 0 points    Immunizations Immunization History  Administered Date(s) Administered   Fluad Quad(high Dose 65+) 09/23/2021   Influenza Split 11/15/2011, 08/24/2012   Influenza Whole 08/29/2007, 08/08/2008, 08/21/2009, 07/23/2010   Influenza, High Dose Seasonal PF 09/05/2014, 10/16/2015, 08/19/2016, 09/07/2018   PFIZER(Purple Top)SARS-COV-2 Vaccination 01/09/2020, 02/05/2020   Pneumococcal Conjugate-13 12/13/2013   Pneumococcal Polysaccharide-23 05/02/2007   Td 08/21/2009   Zoster, Live 02/20/2009    TDAP status: Due, Education has been provided regarding the importance of this vaccine. Advised may receive this vaccine at local pharmacy or Health Dept. Aware to provide a copy of the vaccination record if obtained from local pharmacy or Health Dept. Verbalized acceptance and understanding.  Flu Vaccine status: Up to date  Pneumococcal vaccine status: Up to date  Covid-19 vaccine status: Completed  vaccines  Qualifies for Shingles Vaccine? Yes   Zostavax completed No   Shingrix Completed?: No.    Education has been provided regarding the importance of this vaccine. Patient has been advised to call insurance company to determine out of pocket expense if they have not yet received this vaccine. Advised may also receive vaccine at local pharmacy or Health Dept. Verbalized acceptance and understanding.  Screening Tests Health Maintenance  Topic Date Due   DTaP/Tdap/Td (2 - Tdap) 08/22/2019   FOOT EXAM  01/05/2023 (Originally 07/15/2021)   COVID-19 Vaccine (3 - Pfizer risk series) 01/20/2023 (Originally 03/04/2020)   INFLUENZA VACCINE  02/05/2023 (Originally 06/07/2022)   Zoster Vaccines- Shingrix (1 of 2) 04/04/2023 (Originally 04/17/1960)   HEMOGLOBIN A1C  06/15/2023   Diabetic kidney evaluation - eGFR measurement  07/20/2023   Diabetic kidney evaluation - Urine ACR  07/20/2023   OPHTHALMOLOGY EXAM  07/21/2023   Medicare Annual Wellness (AWV)  01/05/2024   Pneumonia Vaccine 61+ Years old  Completed   DEXA SCAN  Completed   HPV VACCINES  Aged Out    Health Maintenance  Health Maintenance Due  Topic Date Due   DTaP/Tdap/Td (2 - Tdap) 08/22/2019    Colorectal cancer screening: No longer required.   Mammogram status: No longer required due to Age.  Bone Density status: Completed 08/13/14. Results reflect: Bone density results: OSTEOPOROSIS. Repeat every   years.  Lung Cancer Screening: (Low Dose CT Chest recommended if Age 45-80 years, 30 pack-year currently smoking OR have quit w/in 15years.) does not qualify.     Additional Screening:  Hepatitis C Screening: does not qualify; Completed   Vision Screening: Recommended annual ophthalmology exams for early detection of glaucoma and other disorders of the eye. Is the patient up to date with their annual eye exam?  Yes  Who is the provider or what is the name of the office in which the patient attends annual eye exams? Dr  Satira Sark If pt is not established with a provider, would they like to be referred to a provider to establish care? No .   Dental Screening: Recommended annual dental exams for proper oral hygiene  Community Resource Referral / Chronic Care Management:  CRR required this visit?  No   CCM required this visit?  No      Plan:     I have personally reviewed and noted the following in the patient's chart:   Medical and social history Use of alcohol, tobacco or illicit drugs  Current medications and supplements including opioid prescriptions. Patient is not currently taking opioid prescriptions. Functional ability and status Nutritional status Physical activity Advanced directives List of other physicians Hospitalizations, surgeries, and ER  visits in previous 12 months Vitals Screenings to include cognitive, depression, and falls Referrals and appointments  In addition, I have reviewed and discussed with patient certain preventive protocols, quality metrics, and best practice recommendations. A written personalized care plan for preventive services as well as general preventive health recommendations were provided to patient.     Criselda Peaches, LPN   [id number]   Nurse Notes: None

## 2023-01-04 NOTE — Patient Instructions (Addendum)
[name] , Thank you for taking time to come for your Medicare Wellness Visit. I appreciate your ongoing commitment to your health goals. Please review the following plan we discussed and let me know if I can assist you in the future.   These are the goals we discussed:  Goals       Increase physical activity      Stay Healthy (pt-stated)        This is a list of the screening recommended for you and due dates:  Health Maintenance  Topic Date Due   DTaP/Tdap/Td vaccine (2 - Tdap) 08/22/2019   Complete foot exam   01/05/2023*   COVID-19 Vaccine (3 - Pfizer risk series) 01/20/2023*   Flu Shot  02/05/2023*   Zoster (Shingles) Vaccine (1 of 2) 04/04/2023*   Hemoglobin A1C  06/15/2023   Yearly kidney function blood test for diabetes  07/20/2023   Yearly kidney health urinalysis for diabetes  07/20/2023   Eye exam for diabetics  07/21/2023   Medicare Annual Wellness Visit  01/05/2024   Pneumonia Vaccine  Completed   DEXA scan (bone density measurement)  Completed   HPV Vaccine  Aged Out  *Topic was postponed. The date shown is not the original due date.    Advanced directives: Advance directive discussed with you today. Even though you declined this today, please call our office should you change your mind, and we can give you the proper paperwork for you to fill out.   Conditions/risks identified: None  Next appointment: Follow up in one year for your annual wellness visit     Preventive Care 65 Years and Older, Female Preventive care refers to lifestyle choices and visits with your health care provider that can promote health and wellness. What does preventive care include? A yearly physical exam. This is also called an annual well check. Dental exams once or twice a year. Routine eye exams. Ask your health care provider how often you should have your eyes checked. Personal lifestyle choices, including: Daily care of your teeth and gums. Regular physical  activity. Eating a healthy diet. Avoiding tobacco and drug use. Limiting alcohol use. Practicing safe sex. Taking low-dose aspirin every day. Taking vitamin and mineral supplements as recommended by your health care provider. What happens during an annual well check? The services and screenings done by your health care provider during your annual well check will depend on your age, overall health, lifestyle risk factors, and family history of disease. Counseling  Your health care provider may ask you questions about your: Alcohol use. Tobacco use. Drug use. Emotional well-being. Home and relationship well-being. Sexual activity. Eating habits. History of falls. Memory and ability to understand (cognition). Work and work Statistician. Reproductive health. Screening  You may have the following tests or measurements: Height, weight, and BMI. Blood pressure. Lipid and cholesterol levels. These may be checked every 5 years, or more frequently if you are over 86 years old. Skin check. Lung cancer screening. You may have this screening every year starting at age 67 if you have a 30-pack-year history of smoking and currently smoke or have quit within the past 15 years. Fecal occult blood test (FOBT) of the stool. You may have this test every year starting at age 64. Flexible sigmoidoscopy or colonoscopy. You may have a sigmoidoscopy every 5 years or a colonoscopy every 10 years starting at age 6. Hepatitis C blood test. Hepatitis B blood test. Sexually transmitted disease (STD) testing. Diabetes screening. This is  done by checking your blood sugar (glucose) after you have not eaten for a while (fasting). You may have this done every 1-3 years. Bone density scan. This is done to screen for osteoporosis. You may have this done starting at age 82. Mammogram. This may be done every 1-2 years. Talk to your health care provider about how often you should have regular mammograms. Talk with your  health care provider about your test results, treatment options, and if necessary, the need for more tests. Vaccines  Your health care provider may recommend certain vaccines, such as: Influenza vaccine. This is recommended every year. Tetanus, diphtheria, and acellular pertussis (Tdap, Td) vaccine. You may need a Td booster every 10 years. Zoster vaccine. You may need this after age 38. Pneumococcal 13-valent conjugate (PCV13) vaccine. One dose is recommended after age 29. Pneumococcal polysaccharide (PPSV23) vaccine. One dose is recommended after age 88. Talk to your health care provider about which screenings and vaccines you need and how often you need them. This information is not intended to replace advice given to you by your health care provider. Make sure you discuss any questions you have with your health care provider. Document Released: 11/20/2015 Document Revised: 07/13/2016 Document Reviewed: 08/25/2015 Elsevier Interactive Patient Education  2017 [place] Prevention in the Home Falls can cause injuries. They can happen to people of all ages. There are many things you can do to make your home safe and to help prevent falls. What can I do on the outside of my home? Regularly fix the edges of walkways and driveways and fix any cracks. Remove anything that might make you trip as you walk through a door, such as a raised step or threshold. Trim any bushes or trees on the path to your home. Use bright outdoor lighting. Clear any walking paths of anything that might make someone trip, such as rocks or tools. Regularly check to see if handrails are loose or broken. Make sure that both sides of any steps have handrails. Any raised decks and porches should have guardrails on the edges. Have any leaves, snow, or ice cleared regularly. Use sand or salt on walking paths during winter. Clean up any spills in your garage right away. This includes oil or grease spills. What can I  do in the bathroom? Use night lights. Install grab bars by the toilet and in the tub and shower. Do not use towel bars as grab bars. Use non-skid mats or decals in the tub or shower. If you need to sit down in the shower, use a plastic, non-slip stool. Keep the floor dry. Clean up any water that spills on the floor as soon as it happens. Remove soap buildup in the tub or shower regularly. Attach bath mats securely with double-sided non-slip rug tape. Do not have throw rugs and other things on the floor that can make you trip. What can I do in the bedroom? Use night lights. Make sure that you have a light by your bed that is easy to reach. Do not use any sheets or blankets that are too big for your bed. They should not hang down onto the floor. Have a firm chair that has side arms. You can use this for support while you get dressed. Do not have throw rugs and other things on the floor that can make you trip. What can I do in the kitchen? Clean up any spills right away. Avoid walking on wet floors. Keep items that you  use a lot in easy-to-reach places. If you need to reach something above you, use a strong step stool that has a grab bar. Keep electrical cords out of the way. Do not use floor polish or wax that makes floors slippery. If you must use wax, use non-skid floor wax. Do not have throw rugs and other things on the floor that can make you trip. What can I do with my stairs? Do not leave any items on the stairs. Make sure that there are handrails on both sides of the stairs and use them. Fix handrails that are broken or loose. Make sure that handrails are as long as the stairways. Check any carpeting to make sure that it is firmly attached to the stairs. Fix any carpet that is loose or worn. Avoid having throw rugs at the top or bottom of the stairs. If you do have throw rugs, attach them to the floor with carpet tape. Make sure that you have a light switch at the top of the stairs  and the bottom of the stairs. If you do not have them, ask someone to add them for you. What else can I do to help prevent falls? Wear shoes that: Do not have high heels. Have rubber bottoms. Are comfortable and fit you well. Are closed at the toe. Do not wear sandals. If you use a stepladder: Make sure that it is fully opened. Do not climb a closed stepladder. Make sure that both sides of the stepladder are locked into place. Ask someone to hold it for you, if possible. Clearly mark and make sure that you can see: Any grab bars or handrails. First and last steps. Where the edge of each step is. Use tools that help you move around (mobility aids) if they are needed. These include: Canes. Walkers. Scooters. Crutches. Turn on the lights when you go into a dark area. Replace any light bulbs as soon as they burn out. Set up your furniture so you have a clear path. Avoid moving your furniture around. If any of your floors are uneven, fix them. If there are any pets around you, be aware of where they are. Review your medicines with your doctor. Some medicines can make you feel dizzy. This can increase your chance of falling. Ask your doctor what other things that you can do to help prevent falls. This information is not intended to replace advice given to you by your health care provider. Make sure you discuss any questions you have with your health care provider. Document Released: 08/20/2009 Document Revised: 03/31/2016 Document Reviewed: 11/28/2014 Elsevier Interactive Patient Education  2017 [employer].

## 2023-01-10 ENCOUNTER — Other Ambulatory Visit: Payer: Self-pay

## 2023-01-10 DIAGNOSIS — Z8673 Personal history of transient ischemic attack (TIA), and cerebral infarction without residual deficits: Secondary | ICD-10-CM

## 2023-01-17 ENCOUNTER — Ambulatory Visit (HOSPITAL_COMMUNITY)
Admission: RE | Admit: 2023-01-17 | Discharge: 2023-01-17 | Disposition: A | Discharge status: Home / Self Care | Payer: Medicare Other | Source: Ambulatory Visit | Attending: Vascular Surgery | Admitting: Vascular Surgery

## 2023-01-17 ENCOUNTER — Telehealth: Payer: Self-pay | Admitting: Internal Medicine

## 2023-01-17 DIAGNOSIS — Z8673 Personal history of transient ischemic attack (TIA), and cerebral infarction without residual deficits: Secondary | ICD-10-CM | POA: Diagnosis present

## 2023-01-17 NOTE — Telephone Encounter (Signed)
Prescription Request  01/17/2023  LOV: 12/15/2022  What is the name of the medication or equipment?  carvedilol (COREG) 12.5 MG tablet   Have you contacted your pharmacy to request a refill? Yes   Which pharmacy would you like this sent to?   [place], [place] - [id number] Phone: [phone]  Fax: [phone]       Patient notified that their request is being sent to the clinical staff for review and that they should receive a response within 2 business days.

## 2023-01-23 MED ORDER — CARVEDILOL 12.5 MG PO TABS: ORAL_TABLET | ORAL | 3 refills | Status: DC

## 2023-01-23 NOTE — Telephone Encounter (Signed)
On 01/17/23 Carvedilol 12.5mg  was sent to [facility].   Carvedilol 12.5mg  was resent to [facility].    Lvm for patient with information.

## 2023-01-26 ENCOUNTER — Encounter: Payer: Self-pay | Admitting: Vascular Surgery

## 2023-01-26 ENCOUNTER — Ambulatory Visit (INDEPENDENT_AMBULATORY_CARE_PROVIDER_SITE_OTHER): Payer: Medicare Other | Admitting: Vascular Surgery

## 2023-01-26 VITALS — BP 212/81 | HR 79 | Temp 98.1°F | Resp 20 | Ht 61.5 in | Wt 122.0 lb

## 2023-01-26 DIAGNOSIS — I6523 Occlusion and stenosis of bilateral carotid arteries: Secondary | ICD-10-CM | POA: Diagnosis not present

## 2023-01-26 NOTE — Progress Notes (Signed)
ASSESSMENT & PLAN   BILATERAL CAROTID STENOSES: This patient has an asymptomatic 60 to 79% right carotid stenosis with a 40 to 59% left carotid stenosis.  She is on aspirin, Plavix, and a statin.  I explained that we would not consider carotid endarterectomy unless she developed new neurologic symptoms or the stenosis progressed to greater than 80%.  We have discussed these potential neurologic symptoms.  Given that the stenosis on the right is 60 to 79% I would recommend a follow-up carotid duplex scan in 6 months which I have scheduled and I will see her back at that time.  She knows to call sooner if she has problems.  REASON FOR CONSULT:    To evaluate for cerebrovascular disease.  The consult is requested by Dr. Shanon Ace.  HPI:   Sharon Wilson is a 82 y.o. female who was referred with bilateral carotid disease.  Of note she had a TIA around 2017 she states.  This was associated with some clumsiness in both hands.  She underwent an extensive workup and her symptoms resolved.  She has been following with a moderate left carotid stenosis and subsequently developed a right carotid stenosis.  On her most recent carotid duplex scan she had a 60 to 79% right carotid stenosis with a 40 to 59% left carotid stenosis.  She denies any recent history of stroke, TIAs, expressive or receptive aphasia, or amaurosis fugax.  She has had some issues getting her medications but currently just got back on aspirin, Plavix, and a statin.  She states when she takes her blood pressure at home it is always under good control.  She denies any significant claudication, rest pain, or nonhealing ulcers.  Past Medical History:  Diagnosis Date   Abnormal echocardiogram    a.07/2016 Echo: EF 50-55%, basal and mid inferolateral AK.   Agatston coronary artery calcium score greater than 400    a. 07/2016 Abnl Echo with basal and mid inferolateral AK, EF 50-55%; b. 09/01/2016 Ex MV: EF 46%, hypertensive  response, no ST changes, apical thinning with very mild apical ischemia;  c. 09/21/2016 Cardiac CTA: Ca2+ score = 717, 90th percentile. Extensive CAD w/ mod stenosis in distal LM/ostial LAD, 50% RPLV-->rec Cath.   Anemia    hysterectomy   CAD (coronary artery disease)    a. 09/01/2016 Ex MV: EF 46%, hypertensive response, no ST changes, apical thinning with very mild apical ischemia;  c. 09/21/2016 Cardiac CTA: Ca2+ score = 717, 90th percentile. Extensive CAD w/ mod stenosis in distal LM/ostial LAD, 50% RPLV-->rec Cath   Carotid arterial disease w/ left carotid bruit (HCC)    bilateral 40-59% stenosis by dopplers 2018   Diverticulosis    Hyperlipidemia    OA (osteoarthritis)    Stroke (cerebrum) ([place])    a. 07/2016 L facial droop-->Head CT: small area of low attenuation in the right frontoparietal region c/w acute or subacute lacunar infarct; 07/2016 MRI/A brain: acute infarct of right centrum semiovale and possibly posterior limb of right internal capsule. Mild to moderate multifocal narrowing of the MCA arteries w/o occlusion.   Type II diabetes mellitus ([place])    a. 07/2016 A1c 7.3.   White coat hypertension     Family History  Problem Relation Age of Onset   Cancer Mother        breast   Breast cancer Mother    CAD Neg Hx     SOCIAL HISTORY: Social History   Tobacco Use   Smoking status:  Never   Smokeless tobacco: Never  Substance Use Topics   Alcohol use: No    Alcohol/week: 0.0 standard drinks of alcohol    Allergies  Allergen Reactions   Ace Inhibitors Hives, Swelling, Cough and Other (See Comments)   Amlodipine Besylate Other (See Comments)    Light headed and vertigo like  Also may have had hives.  Light headed and vertigo like  Also may have had hives.    Lovastatin Other (See Comments)    Leg cramps,  NO statins    Current Outpatient Medications  Medication Sig Dispense Refill   acetaminophen (TYLENOL) 325 MG tablet Take 2 tablets (650 mg total) by mouth every 4  (four) hours as needed for mild pain or moderate pain.     aspirin EC 81 MG tablet Take 1 tablet (81 mg total) by mouth daily.     Calcium Carbonate-Vitamin D (OSCAL 500/200 D-3 PO) Take 1 tablet by mouth 2 (two) times daily.      carvedilol (COREG) 12.5 MG tablet Take one tablets by mouth twice daily with meals.or as directed 180 tablet 3   clopidogrel (PLAVIX) 75 MG tablet Take 1 tablet (75 mg total) by mouth daily. 90 tablet 1   Cyanocobalamin (B-12 PO) Take by mouth.     ferrous sulfate 325 (65 FE) MG tablet Take 325 mg by mouth daily with breakfast.     Multiple Vitamins-Minerals (ONE-A-DAY WOMENS 50+ PO) Take by mouth.     nitroGLYCERIN (NITROSTAT) 0.4 MG SL tablet Place 1 tablet (0.4 mg total) under the tongue every 5 (five) minutes x 3 doses as needed for chest pain. 25 tablet 12   rosuvastatin (CRESTOR) 5 MG tablet Take 1 tablet by mouth daily if tolerated. 90 tablet 3   isosorbide mononitrate (IMDUR) 30 MG 24 hr tablet Take 0.5 tablets (15 mg total) by mouth daily. (Patient not taking: Reported on 12/15/2022) 45 tablet 3   metFORMIN (GLUCOPHAGE) 500 MG tablet TAKE ONE TABLET BY MOUTH IN THE MORNING AND TAKE TWO TABLETS IN THE EVENING (Patient not taking: Reported on 01/26/2023) 270 tablet 3   No current facility-administered medications for this visit.    REVIEW OF SYSTEMS:  [X]  denotes positive finding, [ ]  denotes negative finding Cardiac  Comments:  Chest pain or chest pressure:    Shortness of breath upon exertion:    Short of breath when lying flat:    Irregular heart rhythm:        Vascular    Pain in calf, thigh, or hip brought on by ambulation:    Pain in feet at night that wakes you up from your sleep:     Blood clot in your veins:    Leg swelling:         Pulmonary    Oxygen at home:    Productive cough:     Wheezing:         Neurologic    Sudden weakness in arms or legs:     Sudden numbness in arms or legs:     Sudden onset of difficulty speaking or slurred  speech:    Temporary loss of vision in one eye:     Problems with dizziness:         Gastrointestinal    Blood in stool:     Vomited blood:         Genitourinary    Burning when urinating:     Blood in urine:  Psychiatric    Major depression:         Hematologic    Bleeding problems:    Problems with blood clotting too easily:        Skin    Rashes or ulcers:        Constitutional    Fever or chills:    -  PHYSICAL EXAM:   Vitals:   01/26/23 1401 01/26/23 1403  BP: (!) 204/78 (!) 212/81  Pulse: 79   Resp: 20   Temp: 98.1 F (36.7 C)   SpO2: 97%   Weight: 122 lb (55.3 kg)   Height: 5' 1.5" (1.562 m)    Body mass index is 22.68 kg/m. GENERAL: The patient is a well-nourished female, in no acute distress. The vital signs are documented above. CARDIAC: There is a regular rate and rhythm.  VASCULAR: She has bilateral carotid bruits. She has palpable femoral pulses. On the right side she has a palpable dorsalis pedis pulse. I cannot palpate pedal pulses on the left.  Both feet are warm and well-perfused however. She has no significant lower extremity swelling. PULMONARY: There is good air exchange bilaterally without wheezing or rales. ABDOMEN: Soft and non-tender with normal pitched bowel sounds.  MUSCULOSKELETAL: There are no major deformities. NEUROLOGIC: No focal weakness or paresthesias are detected. SKIN: There are no ulcers or rashes noted. PSYCHIATRIC: The patient has a normal affect.  DATA:    CAROTID DUPLEX: I have reviewed the carotid duplex scan that was done on 01/17/2023.  On the right side there is a 60 to 79% right carotid stenosis in the proximal ICA.  The right vertebral artery is patent with antegrade flow.  On the left side there is a 40 to 59% carotid stenosis.  The left vertebral artery is patent with antegrade flow.  Deitra Mayo [facility]

## 2023-01-27 ENCOUNTER — Other Ambulatory Visit: Payer: Self-pay

## 2023-01-27 DIAGNOSIS — Z8673 Personal history of transient ischemic attack (TIA), and cerebral infarction without residual deficits: Secondary | ICD-10-CM

## 2023-01-27 DIAGNOSIS — I6523 Occlusion and stenosis of bilateral carotid arteries: Secondary | ICD-10-CM

## 2023-04-25 ENCOUNTER — Ambulatory Visit: Payer: Medicare Other | Admitting: Internal Medicine

## 2023-05-02 ENCOUNTER — Other Ambulatory Visit: Payer: Self-pay | Admitting: *Deleted

## 2023-05-02 MED ORDER — ROSUVASTATIN CALCIUM 5 MG PO TABS: ORAL_TABLET | ORAL | 0 refills | Status: DC

## 2023-05-02 NOTE — Progress Notes (Unsigned)
No chief complaint on file.   HPI: Sharon Wilson 82 y.o. come in for Chronic disease management  Last visit 2 24  Saw dr Edilia Bo 3 24  to get 6 mos CD  ROS: See pertinent positives and negatives per HPI.  Past Medical History:  Diagnosis Date   Abnormal echocardiogram    a.07/2016 Echo: EF 50-55%, basal and mid inferolateral AK.   Agatston coronary artery calcium score greater than 400    a. 07/2016 Abnl Echo with basal and mid inferolateral AK, EF 50-55%; b. 09/01/2016 Ex MV: EF 46%, hypertensive response, no ST changes, apical thinning with very mild apical ischemia;  c. 09/21/2016 Cardiac CTA: Ca2+ score = 717, 90th percentile. Extensive CAD w/ mod stenosis in distal LM/ostial LAD, 50% RPLV-->rec Cath.   Anemia    hysterectomy   CAD (coronary artery disease)    a. 09/01/2016 Ex MV: EF 46%, hypertensive response, no ST changes, apical thinning with very mild apical ischemia;  c. 09/21/2016 Cardiac CTA: Ca2+ score = 717, 90th percentile. Extensive CAD w/ mod stenosis in distal LM/ostial LAD, 50% RPLV-->rec Cath   Carotid arterial disease w/ left carotid bruit (HCC)    bilateral 40-59% stenosis by dopplers 2018   Diverticulosis    Hyperlipidemia    OA (osteoarthritis)    Stroke (cerebrum) (HCC)    a. 07/2016 L facial droop-->Head CT: small area of low attenuation in the right frontoparietal region c/w acute or subacute lacunar infarct; 07/2016 MRI/A brain: acute infarct of right centrum semiovale and possibly posterior limb of right internal capsule. Mild to moderate multifocal narrowing of the MCA arteries w/o occlusion.   Type II diabetes mellitus (HCC)    a. 07/2016 A1c 7.3.   White coat hypertension     Family History  Problem Relation Age of Onset   Cancer Mother        breast   Breast cancer Mother    CAD Neg Hx     Social History   Socioeconomic History   Marital status: Married    Spouse name: Not on file   Number of children: Not on file   Years of  education: Not on file   Highest education level: Not on file  Occupational History   Not on file  Tobacco Use   Smoking status: Never   Smokeless tobacco: Never  Vaping Use   Vaping Use: Never used  Substance and Sexual Activity   Alcohol use: No    Alcohol/week: 0.0 standard drinks of alcohol   Drug use: No   Sexual activity: Not on file  Other Topics Concern   Not on file  Social History Narrative   Lives in [place] with husband.   Retired.   hh of 2   No pets   Social Determinants of Health   Financial Resource Strain: Low Risk  (01/04/2023)   Overall Financial Resource Strain (CARDIA)    Difficulty of Paying Living Expenses: Not hard at all  Food Insecurity: No Food Insecurity (01/04/2023)   Hunger Vital Sign    Worried About Running Out of Food in the Last Year: Never true    Ran Out of Food in the Last Year: Never true  Transportation Needs: No Transportation Needs (01/04/2023)   PRAPARE - Administrator, Civil Service (Medical): No    Lack of Transportation (Non-Medical): No  Physical Activity: Sufficiently Active (01/04/2023)   Exercise Vital Sign    Days of Exercise per Week: 1 day  Minutes of Exercise per Session: 150+ min  Stress: No Stress Concern Present (01/04/2023)   [employer] of Occupational Health - Occupational Stress Questionnaire    Feeling of Stress : Not at all  Social Connections: Socially Integrated (01/04/2023)   Social Connection and Isolation Panel [NHANES]    Frequency of Communication with Friends and Family: More than three times a week    Frequency of Social Gatherings with Friends and Family: More than three times a week    Attends Religious Services: More than 4 times per year    Active Member of [employer] or Organizations: Yes    Attends Engineer, structural: More than 4 times per year    Marital Status: Married    Outpatient Medications Prior to Visit  Medication Sig Dispense Refill   acetaminophen (TYLENOL) 325  MG tablet Take 2 tablets (650 mg total) by mouth every 4 (four) hours as needed for mild pain or moderate pain.     aspirin EC 81 MG tablet Take 1 tablet (81 mg total) by mouth daily.     Calcium Carbonate-Vitamin D (OSCAL 500/200 D-3 PO) Take 1 tablet by mouth 2 (two) times daily.      carvedilol (COREG) 12.5 MG tablet Take one tablets by mouth twice daily with meals.or as directed 180 tablet 3   clopidogrel (PLAVIX) 75 MG tablet Take 1 tablet (75 mg total) by mouth daily. 90 tablet 1   Cyanocobalamin (B-12 PO) Take by mouth.     ferrous sulfate 325 (65 FE) MG tablet Take 325 mg by mouth daily with breakfast.     isosorbide mononitrate (IMDUR) 30 MG 24 hr tablet Take 0.5 tablets (15 mg total) by mouth daily. (Patient not taking: Reported on 12/15/2022) 45 tablet 3   metFORMIN (GLUCOPHAGE) 500 MG tablet TAKE ONE TABLET BY MOUTH IN THE MORNING AND TAKE TWO TABLETS IN THE EVENING (Patient not taking: Reported on 01/26/2023) 270 tablet 3   Multiple Vitamins-Minerals (ONE-A-DAY WOMENS 50+ PO) Take by mouth.     nitroGLYCERIN (NITROSTAT) 0.4 MG SL tablet Place 1 tablet (0.4 mg total) under the tongue every 5 (five) minutes x 3 doses as needed for chest pain. 25 tablet 12   rosuvastatin (CRESTOR) 5 MG tablet Take 1 tablet by mouth daily if tolerated. 30 tablet 0   No facility-administered medications prior to visit.     EXAM:  There were no vitals taken for this visit.  There is no height or weight on file to calculate BMI.  GENERAL: vitals reviewed and listed above, alert, oriented, appears well hydrated and in no acute distress HEENT: atraumatic, conjunctiva  clear, no obvious abnormalities on inspection of external nose and ears OP : no lesion edema or exudate  NECK: no obvious masses on inspection palpation  LUNGS: clear to auscultation bilaterally, no wheezes, rales or rhonchi, good air movement CV: HRRR, no clubbing cyanosis or  peripheral edema nl cap refill  MS: moves all extremities  without noticeable focal  abnormality PSYCH: pleasant and cooperative, no obvious depression or anxiety Lab Results  Component Value Date   WBC 5.3 07/19/2022   HGB 12.5 07/19/2022   HCT 36.6 07/19/2022   PLT 181.0 07/19/2022   GLUCOSE 112 (H) 07/19/2022   CHOL 109 07/19/2022   TRIG 103.0 07/19/2022   HDL 45.80 07/19/2022   LDLDIRECT 136.0 05/05/2016   LDLCALC 43 07/19/2022   ALT 11 07/19/2022   AST 21 07/19/2022   NA 140 07/19/2022   K 3.8  07/19/2022   CL 102 07/19/2022   CREATININE 1.01 07/19/2022   BUN 13 07/19/2022   CO2 31 07/19/2022   TSH 2.97 07/21/2021   INR 1.0 09/22/2016   HGBA1C 6.5 (A) 12/15/2022   MICROALBUR 2.3 (H) 07/19/2022   BP Readings from Last 3 Encounters:  01/26/23 (!) 212/81  12/15/22 (!) 146/94  07/27/22 (!) 158/80    ASSESSMENT AND PLAN:  Discussed the following assessment and plan:  Medication management  Essential hypertension  Peripheral arterial disease (HCC)  Hyperlipidemia, unspecified hyperlipidemia type  DM (diabetes mellitus) type II, controlled, with peripheral vascular disorder (HCC)  -Patient advised to return or notify health care team  if  new concerns arise.  There are no Patient Instructions on file for this visit.   Neta Mends. Joenathan Sakuma M.D.

## 2023-05-03 ENCOUNTER — Ambulatory Visit: Payer: Medicare Other | Admitting: Internal Medicine

## 2023-05-03 ENCOUNTER — Other Ambulatory Visit: Payer: Self-pay | Admitting: Internal Medicine

## 2023-05-03 ENCOUNTER — Encounter: Payer: Self-pay | Admitting: Internal Medicine

## 2023-05-03 VITALS — BP 130/70 | HR 69 | Temp 97.9°F | Ht 61.5 in | Wt 122.8 lb

## 2023-05-03 DIAGNOSIS — Z79899 Other long term (current) drug therapy: Secondary | ICD-10-CM

## 2023-05-03 DIAGNOSIS — E1151 Type 2 diabetes mellitus with diabetic peripheral angiopathy without gangrene: Secondary | ICD-10-CM

## 2023-05-03 DIAGNOSIS — I739 Peripheral vascular disease, unspecified: Secondary | ICD-10-CM

## 2023-05-03 DIAGNOSIS — Z8673 Personal history of transient ischemic attack (TIA), and cerebral infarction without residual deficits: Secondary | ICD-10-CM

## 2023-05-03 DIAGNOSIS — Z7984 Long term (current) use of oral hypoglycemic drugs: Secondary | ICD-10-CM

## 2023-05-03 DIAGNOSIS — I70213 Atherosclerosis of native arteries of extremities with intermittent claudication, bilateral legs: Secondary | ICD-10-CM

## 2023-05-03 DIAGNOSIS — I779 Disorder of arteries and arterioles, unspecified: Secondary | ICD-10-CM

## 2023-05-03 DIAGNOSIS — E785 Hyperlipidemia, unspecified: Secondary | ICD-10-CM

## 2023-05-03 DIAGNOSIS — I10 Essential (primary) hypertension: Secondary | ICD-10-CM

## 2023-05-03 LAB — POCT GLYCOSYLATED HEMOGLOBIN (HGB A1C): Hemoglobin A1C: 6.8 % — AB (ref 4.0–5.6)

## 2023-05-03 NOTE — Patient Instructions (Addendum)
Good to se you today   Plan cpe in end October  /November time  lab pre visit . Otherwise no new advice . Nonchange

## 2023-05-03 NOTE — Progress Notes (Signed)
Future orders to be done before nex cpe in the fall

## 2023-05-21 ENCOUNTER — Other Ambulatory Visit: Payer: Self-pay | Admitting: Cardiology

## 2023-06-13 ENCOUNTER — Other Ambulatory Visit: Payer: Self-pay | Admitting: Cardiology

## 2023-07-07 ENCOUNTER — Other Ambulatory Visit: Payer: Self-pay | Admitting: Cardiology

## 2023-07-17 ENCOUNTER — Other Ambulatory Visit: Payer: Self-pay | Admitting: Cardiology

## 2023-07-27 ENCOUNTER — Ambulatory Visit (INDEPENDENT_AMBULATORY_CARE_PROVIDER_SITE_OTHER): Payer: Medicare Other | Admitting: Vascular Surgery

## 2023-07-27 ENCOUNTER — Encounter: Payer: Self-pay | Admitting: Vascular Surgery

## 2023-07-27 ENCOUNTER — Ambulatory Visit (HOSPITAL_COMMUNITY)
Admission: RE | Admit: 2023-07-27 | Discharge: 2023-07-27 | Disposition: A | Discharge status: Home / Self Care | Payer: Medicare Other | Source: Ambulatory Visit | Attending: Vascular Surgery | Admitting: Vascular Surgery

## 2023-07-27 VITALS — BP 218/78 | HR 62 | Temp 98.0°F | Resp 20 | Ht 61.5 in | Wt 124.0 lb

## 2023-07-27 DIAGNOSIS — Z8673 Personal history of transient ischemic attack (TIA), and cerebral infarction without residual deficits: Secondary | ICD-10-CM | POA: Insufficient documentation

## 2023-07-27 DIAGNOSIS — I6523 Occlusion and stenosis of bilateral carotid arteries: Secondary | ICD-10-CM | POA: Insufficient documentation

## 2023-07-27 NOTE — Progress Notes (Signed)
REASON FOR VISIT:   Follow-up of bilateral carotid disease.  MEDICAL ISSUES:   BILATERAL CAROTID STENOSES: This patient has an asymptomatic 60 to 79% right carotid stenosis with a 40 to 59% left carotid stenosis.  She is on aspirin, Plavix, and a statin.  She is not a smoker.  I explained that we would not consider carotid endarterectomy unless the stenosis progressed to greater than 80% or she develop new neurologic symptoms.  We have reviewed the potential symptoms of cerebrovascular disease.  I have ordered a follow-up carotid duplex scan in 6 months.  I have explained that I will be retiring.  She is agreeable to be seen on the PA schedule at that time.  She will call sooner with any changes.  HPI:   Sharon Wilson is a pleasant 82 y.o. female who I last saw on 01/26/2023 with bilateral carotid stenoses.  When I saw her last, she had an asymptomatic 60 to 79% right carotid stenosis with a 40 to 59% left carotid stenosis.  She was on aspirin, Plavix, and a statin.  She comes in for a 70-month follow-up visit.  Since I saw her last, she denies any history of stroke, TIAs, expressive or receptive aphasia, or amaurosis fugax.  She remains on aspirin, Plavix, and a statin.  Past Medical History:  Diagnosis Date   Abnormal echocardiogram    a.07/2016 Echo: EF 50-55%, basal and mid inferolateral AK.   Agatston coronary artery calcium score greater than 400    a. 07/2016 Abnl Echo with basal and mid inferolateral AK, EF 50-55%; b. 09/01/2016 Ex MV: EF 46%, hypertensive response, no ST changes, apical thinning with very mild apical ischemia;  c. 09/21/2016 Cardiac CTA: Ca2+ score = 717, 90th percentile. Extensive CAD w/ mod stenosis in distal LM/ostial LAD, 50% RPLV-->rec Cath.   Anemia    hysterectomy   CAD (coronary artery disease)    a. 09/01/2016 Ex MV: EF 46%, hypertensive response, no ST changes, apical thinning with very mild apical ischemia;  c. 09/21/2016 Cardiac CTA: Ca2+ score =  717, 90th percentile. Extensive CAD w/ mod stenosis in distal LM/ostial LAD, 50% RPLV-->rec Cath   Carotid arterial disease w/ left carotid bruit (HCC)    bilateral 40-59% stenosis by dopplers 2018   Diverticulosis    Hyperlipidemia    OA (osteoarthritis)    Stroke (cerebrum) (HCC)    a. 07/2016 L facial droop-->Head CT: small area of low attenuation in the right frontoparietal region c/w acute or subacute lacunar infarct; 07/2016 MRI/A brain: acute infarct of right centrum semiovale and possibly posterior limb of right internal capsule. Mild to moderate multifocal narrowing of the MCA arteries w/o occlusion.   Type II diabetes mellitus (HCC)    a. 07/2016 A1c 7.3.   White coat hypertension     Family History  Problem Relation Age of Onset   Cancer Mother        breast   Breast cancer Mother    CAD Neg Hx     SOCIAL HISTORY: Social History   Tobacco Use   Smoking status: Never   Smokeless tobacco: Never  Substance Use Topics   Alcohol use: No    Alcohol/week: 0.0 standard drinks of alcohol    Allergies  Allergen Reactions   Ace Inhibitors Hives, Swelling, Cough and Other (See Comments)   Amlodipine Besylate Other (See Comments)    Light headed and vertigo like  Also may have had hives.  Light headed and vertigo  like  Also may have had hives.    Lovastatin Other (See Comments)    Leg cramps,  NO statins    Current Outpatient Medications  Medication Sig Dispense Refill   acetaminophen (TYLENOL) 325 MG tablet Take 2 tablets (650 mg total) by mouth every 4 (four) hours as needed for mild pain or moderate pain.     aspirin EC 81 MG tablet Take 1 tablet (81 mg total) by mouth daily.     Calcium Carbonate-Vitamin D (OSCAL 500/200 D-3 PO) Take 1 tablet by mouth 2 (two) times daily.      carvedilol (COREG) 12.5 MG tablet Take one tablets by mouth twice daily with meals.or as directed 180 tablet 3   clopidogrel (PLAVIX) 75 MG tablet Take 1 tablet (75 mg total) by mouth daily. 90  tablet 1   Cyanocobalamin (B-12 PO) Take by mouth.     ferrous sulfate 325 (65 FE) MG tablet Take 325 mg by mouth daily with breakfast.     isosorbide mononitrate (IMDUR) 30 MG 24 hr tablet Take 0.5 tablets (15 mg total) by mouth daily. 45 tablet 3   metFORMIN (GLUCOPHAGE) 500 MG tablet TAKE ONE TABLET BY MOUTH IN THE MORNING AND TAKE TWO TABLETS IN THE EVENING 270 tablet 3   Multiple Vitamins-Minerals (ONE-A-DAY WOMENS 50+ PO) Take by mouth.     nitroGLYCERIN (NITROSTAT) 0.4 MG SL tablet Place 1 tablet (0.4 mg total) under the tongue every 5 (five) minutes x 3 doses as needed for chest pain. 25 tablet 12   rosuvastatin (CRESTOR) 5 MG tablet Take 1 tablet by mouth daily if tolerated. Please keep September appointment. 15 tablet 0   No current facility-administered medications for this visit.    REVIEW OF SYSTEMS:  [X]  denotes positive finding, [ ]  denotes negative finding Cardiac  Comments:  Chest pain or chest pressure:    Shortness of breath upon exertion:    Short of breath when lying flat:    Irregular heart rhythm:        Vascular    Pain in calf, thigh, or hip brought on by ambulation:    Pain in feet at night that wakes you up from your sleep:     Blood clot in your veins:    Leg swelling:         Pulmonary    Oxygen at home:    Productive cough:     Wheezing:         Neurologic    Sudden weakness in arms or legs:     Sudden numbness in arms or legs:     Sudden onset of difficulty speaking or slurred speech:    Temporary loss of vision in one eye:     Problems with dizziness:         Gastrointestinal    Blood in stool:     Vomited blood:         Genitourinary    Burning when urinating:     Blood in urine:        Psychiatric    Major depression:         Hematologic    Bleeding problems:    Problems with blood clotting too easily:        Skin    Rashes or ulcers:        Constitutional    Fever or chills:     PHYSICAL EXAM:   Vitals:   07/27/23 1343  07/27/23 1345  BP: Marland Kitchen)  199/73 (!) 218/78  Pulse: 62   Resp: 20   Temp: 98 F (36.7 C)   SpO2: 97%   Weight: 124 lb (56.2 kg)   Height: 5' 1.5" (1.562 m)    GENERAL: The patient is a well-nourished female, in no acute distress. The vital signs are documented above. CARDIAC: There is a regular rate and rhythm.  VASCULAR: She has bilateral carotid bruits. She has palpable femoral pulses.  I cannot palpate pedal pulses.  She does have a brisk but monophasic dorsalis pedis and posterior tibial signal bilaterally. PULMONARY: There is good air exchange bilaterally without wheezing or rales. ABDOMEN: Soft and non-tender with normal pitched bowel sounds.  MUSCULOSKELETAL: There are no major deformities or cyanosis. NEUROLOGIC: No focal weakness or paresthesias are detected. SKIN: There are no ulcers or rashes noted. PSYCHIATRIC: The patient has a normal affect.  DATA:    CAROTID DUPLEX: I have independently interpreted her carotid duplex scan today.  On the right side there is a 60 to 79% stenosis.  Peak systolic velocity is 305 cm/s with an end-diastolic velocity of 76 cm/s.  These velocities have improved slightly compared to her study 6 months ago.  The right vertebral artery is patent with antegrade flow.  On the left side there is a 40 to 59% carotid stenosis.  The velocities are essentially the same compared to 6 months ago.  The left vertebral artery is patent with antegrade flow.  She is noted to have some flow disturbance in the right subclavian artery.  The left subclavian artery has normal flow hemodynamics.   Waverly Ferrari [facility] [phone]

## 2023-07-28 ENCOUNTER — Other Ambulatory Visit: Payer: Self-pay

## 2023-07-28 DIAGNOSIS — I6523 Occlusion and stenosis of bilateral carotid arteries: Secondary | ICD-10-CM

## 2023-09-13 ENCOUNTER — Other Ambulatory Visit: Payer: Medicare Other

## 2023-09-13 DIAGNOSIS — E785 Hyperlipidemia, unspecified: Secondary | ICD-10-CM

## 2023-09-13 DIAGNOSIS — I739 Peripheral vascular disease, unspecified: Secondary | ICD-10-CM

## 2023-09-13 DIAGNOSIS — Z79899 Other long term (current) drug therapy: Secondary | ICD-10-CM | POA: Diagnosis not present

## 2023-09-13 DIAGNOSIS — I70213 Atherosclerosis of native arteries of extremities with intermittent claudication, bilateral legs: Secondary | ICD-10-CM

## 2023-09-13 DIAGNOSIS — I10 Essential (primary) hypertension: Secondary | ICD-10-CM

## 2023-09-13 DIAGNOSIS — E1151 Type 2 diabetes mellitus with diabetic peripheral angiopathy without gangrene: Secondary | ICD-10-CM

## 2023-09-13 LAB — HEPATIC FUNCTION PANEL
ALT: 9 U/L (ref 0–35)
AST: 18 U/L (ref 0–37)
Albumin: 4.1 g/dL (ref 3.5–5.2)
Alkaline Phosphatase: 52 U/L (ref 39–117)
Bilirubin, Direct: 0.1 mg/dL (ref 0.0–0.3)
Total Bilirubin: 0.8 mg/dL (ref 0.2–1.2)
Total Protein: 6.5 g/dL (ref 6.0–8.3)

## 2023-09-13 LAB — CBC WITH DIFFERENTIAL/PLATELET
Basophils Absolute: 0 10*3/uL (ref 0.0–0.1)
Basophils Relative: 0.6 % (ref 0.0–3.0)
Eosinophils Absolute: 0.5 10*3/uL (ref 0.0–0.7)
Eosinophils Relative: 9.1 % — ABNORMAL HIGH (ref 0.0–5.0)
HCT: 35.9 % — ABNORMAL LOW (ref 36.0–46.0)
Hemoglobin: 12 g/dL (ref 12.0–15.0)
Lymphocytes Relative: 15.1 % (ref 12.0–46.0)
Lymphs Abs: 0.8 10*3/uL (ref 0.7–4.0)
MCHC: 33.5 g/dL (ref 30.0–36.0)
MCV: 93.1 fL (ref 78.0–100.0)
Monocytes Absolute: 0.4 10*3/uL (ref 0.1–1.0)
Monocytes Relative: 8 % (ref 3.0–12.0)
Neutro Abs: 3.5 10*3/uL (ref 1.4–7.7)
Neutrophils Relative %: 67.2 % (ref 43.0–77.0)
Platelets: 206 10*3/uL (ref 150.0–400.0)
RBC: 3.85 Mil/uL — ABNORMAL LOW (ref 3.87–5.11)
RDW: 12.7 % (ref 11.5–15.5)
WBC: 5.2 10*3/uL (ref 4.0–10.5)

## 2023-09-13 LAB — BASIC METABOLIC PANEL
BUN: 15 mg/dL (ref 6–23)
CO2: 29 meq/L (ref 19–32)
Calcium: 9.3 mg/dL (ref 8.4–10.5)
Chloride: 100 meq/L (ref 96–112)
Creatinine, Ser: 1.07 mg/dL (ref 0.40–1.20)
GFR: 48.07 mL/min — ABNORMAL LOW (ref >60.00)
Glucose, Bld: 114 mg/dL — ABNORMAL HIGH (ref 70–99)
Potassium: 3.7 meq/L (ref 3.5–5.1)
Sodium: 137 meq/L (ref 135–145)

## 2023-09-13 LAB — LIPID PANEL
Cholesterol: 199 mg/dL (ref 0–200)
HDL: 41.1 mg/dL (ref >39.00)
LDL Cholesterol: 124 mg/dL — ABNORMAL HIGH (ref 0–99)
NonHDL: 157.48
Total CHOL/HDL Ratio: 5
Triglycerides: 168 mg/dL — ABNORMAL HIGH (ref 0.0–149.0)
VLDL: 33.6 mg/dL (ref 0.0–40.0)

## 2023-09-13 LAB — VITAMIN B12: Vitamin B-12: 1395 pg/mL — ABNORMAL HIGH (ref 211–911)

## 2023-09-13 LAB — TSH: TSH: 4.15 u[IU]/mL (ref 0.35–5.50)

## 2023-09-13 LAB — HEMOGLOBIN A1C: Hgb A1c MFr Bld: 6.9 % — ABNORMAL HIGH (ref 4.6–6.5)

## 2023-09-13 NOTE — Progress Notes (Signed)
Ldl up from last time  Will discuss at   upcoming visit .  Next week

## 2023-09-19 NOTE — Progress Notes (Unsigned)
No chief complaint on file.   HPI: Patient  Sharon Wilson  82 y.o. comes in today for Preventive Health Care visit  And Chronic disease management    Health Maintenance  Topic Date Due   Zoster Vaccines- Shingrix (1 of 2) 04/17/1960   DTaP/Tdap/Td (2 - Tdap) 08/22/2019   COVID-19 Vaccine (3 - Pfizer risk series) 03/04/2020   FOOT EXAM  07/15/2021   INFLUENZA VACCINE  06/08/2023   Diabetic kidney evaluation - Urine ACR  07/20/2023   OPHTHALMOLOGY EXAM  07/21/2023   Medicare Annual Wellness (AWV)  01/05/2024   HEMOGLOBIN A1C  03/12/2024   Diabetic kidney evaluation - eGFR measurement  09/12/2024   Pneumonia Vaccine 23+ Years old  Completed   DEXA SCAN  Completed   HPV VACCINES  Aged Out   Health Maintenance Review LIFESTYLE:  Exercise:   Tobacco/ETS: Alcohol:  Sugar beverages: Sleep: Drug use: no HH of  Work:    ROS:  GEN/ HEENT: No fever, significant weight changes sweats headaches vision problems hearing changes, CV/ PULM; No chest pain shortness of breath cough, syncope,edema  change in exercise tolerance. GI /GU: No adominal pain, vomiting, change in bowel habits. No blood in the stool. No significant GU symptoms. SKIN/HEME: ,no acute skin rashes suspicious lesions or bleeding. No lymphadenopathy, nodules, masses.  NEURO/ PSYCH:  No neurologic signs such as weakness numbness. No depression anxiety. IMM/ Allergy: No unusual infections.  Allergy .   REST of 12 system review negative except as per HPI   Past Medical History:  Diagnosis Date   Abnormal echocardiogram    a.07/2016 Echo: EF 50-55%, basal and mid inferolateral AK.   Agatston coronary artery calcium score greater than 400    a. 07/2016 Abnl Echo with basal and mid inferolateral AK, EF 50-55%; b. 09/01/2016 Ex MV: EF 46%, hypertensive response, no ST changes, apical thinning with very mild apical ischemia;  c. 09/21/2016 Cardiac CTA: Ca2+ score = 717, 90th percentile. Extensive CAD w/ mod stenosis  in distal LM/ostial LAD, 50% RPLV-->rec Cath.   Anemia    hysterectomy   CAD (coronary artery disease)    a. 09/01/2016 Ex MV: EF 46%, hypertensive response, no ST changes, apical thinning with very mild apical ischemia;  c. 09/21/2016 Cardiac CTA: Ca2+ score = 717, 90th percentile. Extensive CAD w/ mod stenosis in distal LM/ostial LAD, 50% RPLV-->rec Cath   Carotid arterial disease w/ left carotid bruit (HCC)    bilateral 40-59% stenosis by dopplers 2018   Diverticulosis    Hyperlipidemia    OA (osteoarthritis)    Stroke (cerebrum) (HCC)    a. 07/2016 L facial droop-->Head CT: small area of low attenuation in the right frontoparietal region c/w acute or subacute lacunar infarct; 07/2016 MRI/A brain: acute infarct of right centrum semiovale and possibly posterior limb of right internal capsule. Mild to moderate multifocal narrowing of the MCA arteries w/o occlusion.   Type II diabetes mellitus (HCC)    a. 07/2016 A1c 7.3.   White coat hypertension     Past Surgical History:  Procedure Laterality Date   ABDOMINAL HYSTERECTOMY     fibroid bleeding   CARDIAC CATHETERIZATION N/A 09/23/2016   Procedure: Left Heart Cath and Coronary Angiography;  Surgeon: Yvonne Kendall, MD;  Location: [facility] INVASIVE CV LAB;  Service: Cardiovascular;  Laterality: N/A;   CARDIAC CATHETERIZATION N/A 09/23/2016   Procedure: Intravascular Pressure Wire/FFR Study;  Surgeon: Yvonne Kendall, MD;  Location: [facility] INVASIVE CV LAB;  Service: Cardiovascular;  Laterality:  N/A;   carotid dopplers  11/11   per HA clinic   CATARACT EXTRACTION  2011   EAR CYST EXCISION Left 11/25/2019   Procedure: EXCISION LEFT EAR CANAL MASS;  Surgeon: Serena Colonel, MD;  Location: [facility];  Service: ENT;  Laterality: Left;    Family History  Problem Relation Age of Onset   Cancer Mother        breast   Breast cancer Mother    CAD Neg Hx     Social History   Socioeconomic History   Marital status: Married    Spouse  name: Not on file   Number of children: Not on file   Years of education: Not on file   Highest education level: Not on file  Occupational History   Not on file  Tobacco Use   Smoking status: Never   Smokeless tobacco: Never  Vaping Use   Vaping status: Never Used  Substance and Sexual Activity   Alcohol use: No    Alcohol/week: 0.0 standard drinks of alcohol   Drug use: No   Sexual activity: Not on file  Other Topics Concern   Not on file  Social History Narrative   Lives in [place] with husband.   Retired.   hh of 2   No pets   Social Determinants of Health   Financial Resource Strain: Low Risk  (01/04/2023)   Overall Financial Resource Strain (CARDIA)    Difficulty of Paying Living Expenses: Not hard at all  Food Insecurity: No Food Insecurity (01/04/2023)   Hunger Vital Sign    Worried About Running Out of Food in the Last Year: Never true    Ran Out of Food in the Last Year: Never true  Transportation Needs: No Transportation Needs (01/04/2023)   PRAPARE - Administrator, Civil Service (Medical): No    Lack of Transportation (Non-Medical): No  Physical Activity: Sufficiently Active (01/04/2023)   Exercise Vital Sign    Days of Exercise per Week: 1 day    Minutes of Exercise per Session: 150+ min  Stress: No Stress Concern Present (01/04/2023)   [employer] of Occupational Health - Occupational Stress Questionnaire    Feeling of Stress : Not at all  Social Connections: Socially Integrated (01/04/2023)   Social Connection and Isolation Panel [NHANES]    Frequency of Communication with Friends and Family: More than three times a week    Frequency of Social Gatherings with Friends and Family: More than three times a week    Attends Religious Services: More than 4 times per year    Active Member of [employer] or Organizations: Yes    Attends Engineer, structural: More than 4 times per year    Marital Status: Married    Outpatient Medications Prior to  Visit  Medication Sig Dispense Refill   acetaminophen (TYLENOL) 325 MG tablet Take 2 tablets (650 mg total) by mouth every 4 (four) hours as needed for mild pain or moderate pain.     aspirin EC 81 MG tablet Take 1 tablet (81 mg total) by mouth daily.     Calcium Carbonate-Vitamin D (OSCAL 500/200 D-3 PO) Take 1 tablet by mouth 2 (two) times daily.      carvedilol (COREG) 12.5 MG tablet Take one tablets by mouth twice daily with meals.or as directed 180 tablet 3   clopidogrel (PLAVIX) 75 MG tablet Take 1 tablet (75 mg total) by mouth daily. 90 tablet 1   Cyanocobalamin (  B-12 PO) Take by mouth.     ferrous sulfate 325 (65 FE) MG tablet Take 325 mg by mouth daily with breakfast.     isosorbide mononitrate (IMDUR) 30 MG 24 hr tablet Take 0.5 tablets (15 mg total) by mouth daily. 45 tablet 3   metFORMIN (GLUCOPHAGE) 500 MG tablet TAKE ONE TABLET BY MOUTH IN THE MORNING AND TAKE TWO TABLETS IN THE EVENING 270 tablet 3   Multiple Vitamins-Minerals (ONE-A-DAY WOMENS 50+ PO) Take by mouth.     nitroGLYCERIN (NITROSTAT) 0.4 MG SL tablet Place 1 tablet (0.4 mg total) under the tongue every 5 (five) minutes x 3 doses as needed for chest pain. 25 tablet 12   rosuvastatin (CRESTOR) 5 MG tablet Take 1 tablet by mouth daily if tolerated. Please keep September appointment. 15 tablet 0   No facility-administered medications prior to visit.     EXAM:  There were no vitals taken for this visit.  There is no height or weight on file to calculate BMI. Wt Readings from Last 3 Encounters:  07/27/23 124 lb (56.2 kg)  05/03/23 122 lb 12.8 oz (55.7 kg)  01/26/23 122 lb (55.3 kg)    Physical Exam: Vital signs reviewed ZOX:WRUE is a well-developed well-nourished alert cooperative    who appearsr stated age in no acute distress.  HEENT: normocephalic atraumatic , Eyes: PERRL EOM's full, conjunctiva clear, Nares: paten,t no deformity discharge or tenderness., Ears: no deformity EAC's clear TMs with normal  landmarks. Mouth: clear OP, no lesions, edema.  Moist mucous membranes. Dentition in adequate repair. NECK: supple without masses, thyromegaly or bruits. CHEST/PULM:  Clear to auscultation and percussion breath sounds equal no wheeze , rales or rhonchi. No chest wall deformities or tenderness. Breast: normal by inspection . No dimpling, discharge, masses, tenderness or discharge . CV: PMI is nondisplaced, S1 S2 no gallops, murmurs, rubs. Peripheral pulses are full without delay.No JVD .  ABDOMEN: Bowel sounds normal nontender  No guard or rebound, no hepato splenomegal no CVA tenderness.  Extremtities:  No clubbing cyanosis or edema, no acute joint swelling or redness no focal atrophy NEURO:  Oriented x3, cranial nerves 3-12 appear to be intact, no obvious focal weakness,gait within normal limits no abnormal reflexes or asymmetrical SKIN: No acute rashes normal turgor, color, no bruising or petechiae. PSYCH: Oriented, good eye contact, no obvious depression anxiety, cognition and judgment appear normal. LN: no cervical axillary adenopathy  Lab Results  Component Value Date   WBC 5.2 09/13/2023   HGB 12.0 09/13/2023   HCT 35.9 (L) 09/13/2023   PLT 206.0 09/13/2023   GLUCOSE 114 (H) 09/13/2023   CHOL 199 09/13/2023   TRIG 168.0 (H) 09/13/2023   HDL 41.10 09/13/2023   LDLDIRECT 136.0 05/05/2016   LDLCALC 124 (H) 09/13/2023   ALT 9 09/13/2023   AST 18 09/13/2023   NA 137 09/13/2023   K 3.7 09/13/2023   CL 100 09/13/2023   CREATININE 1.07 09/13/2023   BUN 15 09/13/2023   CO2 29 09/13/2023   TSH 4.15 09/13/2023   INR 1.0 09/22/2016   HGBA1C 6.9 (H) 09/13/2023   MICROALBUR 2.3 (H) 07/19/2022    BP Readings from Last 3 Encounters:  07/27/23 (!) 218/78  05/03/23 130/70  01/26/23 (!) 212/81    Lab results reviewed with patient   ASSESSMENT AND PLAN:  Discussed the following assessment and plan:    ICD-10-CM   1. Visit for preventive health examination  Z00.00     2.  Non-insulin dependent  type 2 diabetes mellitus (HCC)  E11.9     3. Peripheral arterial disease (HCC)  I73.9     4. History of stroke  Z86.73     5. Essential hypertension  I10     6. Coronary artery disease involving native coronary artery of native heart without angina pectoris  I25.10      No follow-ups on file.  Patient Care Team: Ottie Neglia, Neta Mends, MD as PCP - General Quintella Reichert, MD as PCP - Cardiology (Cardiology) Janet Berlin, MD as Attending Physician (Ophthalmology) Marcelene Butte, MD as Referring Physician (Ophthalmology) End, Cristal Deer, MD as Consulting Physician (Cardiology) Quintella Reichert, MD as Consulting Physician (Cardiology) Serena Colonel, MD as Consulting Physician (Otolaryngology) There are no Patient Instructions on file for this visit.  Neta Mends. Varina Hulon M.D.

## 2023-09-20 ENCOUNTER — Ambulatory Visit (INDEPENDENT_AMBULATORY_CARE_PROVIDER_SITE_OTHER): Payer: Medicare Other | Admitting: Internal Medicine

## 2023-09-20 ENCOUNTER — Encounter: Payer: Self-pay | Admitting: Internal Medicine

## 2023-09-20 VITALS — BP 138/70 | HR 64 | Temp 97.6°F | Ht 61.5 in | Wt 120.8 lb

## 2023-09-20 DIAGNOSIS — I10 Essential (primary) hypertension: Secondary | ICD-10-CM

## 2023-09-20 DIAGNOSIS — Z79899 Other long term (current) drug therapy: Secondary | ICD-10-CM

## 2023-09-20 DIAGNOSIS — I2510 Atherosclerotic heart disease of native coronary artery without angina pectoris: Secondary | ICD-10-CM

## 2023-09-20 DIAGNOSIS — Z0000 Encounter for general adult medical examination without abnormal findings: Secondary | ICD-10-CM | POA: Diagnosis not present

## 2023-09-20 DIAGNOSIS — I739 Peripheral vascular disease, unspecified: Secondary | ICD-10-CM | POA: Diagnosis not present

## 2023-09-20 DIAGNOSIS — Z8673 Personal history of transient ischemic attack (TIA), and cerebral infarction without residual deficits: Secondary | ICD-10-CM | POA: Diagnosis not present

## 2023-09-20 DIAGNOSIS — M79672 Pain in left foot: Secondary | ICD-10-CM

## 2023-09-20 DIAGNOSIS — E785 Hyperlipidemia, unspecified: Secondary | ICD-10-CM

## 2023-09-20 DIAGNOSIS — E119 Type 2 diabetes mellitus without complications: Secondary | ICD-10-CM

## 2023-09-20 DIAGNOSIS — M79671 Pain in right foot: Secondary | ICD-10-CM

## 2023-09-20 NOTE — Patient Instructions (Addendum)
Good to see you today. Not sure how much the Crestor contributing to feet leg pain with walking .  Will order updated  leg vascular test as discussed and get sports medicine to  assess also .   Make sure bp is controlled .  Consider changing location of cardiology to [facility]  for better access to care.  Lab is acceptable except  cholesterol off  of statin meds.

## 2023-09-26 NOTE — Progress Notes (Unsigned)
    Sharon Wilson D.[facility] [address] [place] [address] Phone: [phone]   Assessment and Plan:     There are no diagnoses linked to this encounter.  ***   Pertinent previous records reviewed include ***    Follow Up: ***     Subjective:   I, Sharon Wilson, am serving as a Neurosurgeon for Doctor Richardean Sale  Chief Complaint: feet and leg pain   HPI:   09/27/2023 Patient is a 82 year old female with concerns of foot and leg pain. Patient states  Relevant Historical Information: ***  Additional pertinent review of systems negative.   Current Outpatient Medications:    acetaminophen (TYLENOL) 325 MG tablet, Take 2 tablets (650 mg total) by mouth every 4 (four) hours as needed for mild pain or moderate pain., Disp: , Rfl:    aspirin EC 81 MG tablet, Take 1 tablet (81 mg total) by mouth daily., Disp: , Rfl:    Calcium Carbonate-Vitamin D (OSCAL 500/200 D-3 PO), Take 1 tablet by mouth 2 (two) times daily. , Disp: , Rfl:    carvedilol (COREG) 12.5 MG tablet, Take one tablets by mouth twice daily with meals.or as directed, Disp: 180 tablet, Rfl: 3   clopidogrel (PLAVIX) 75 MG tablet, Take 1 tablet (75 mg total) by mouth daily., Disp: 90 tablet, Rfl: 1   Cyanocobalamin (B-12 PO), Take by mouth., Disp: , Rfl:    ferrous sulfate 325 (65 FE) MG tablet, Take 325 mg by mouth. 1 tab 2x a week., Disp: , Rfl:    isosorbide mononitrate (IMDUR) 30 MG 24 hr tablet, Take 0.5 tablets (15 mg total) by mouth daily., Disp: 45 tablet, Rfl: 3   metFORMIN (GLUCOPHAGE) 500 MG tablet, TAKE ONE TABLET BY MOUTH IN THE MORNING AND TAKE TWO TABLETS IN THE EVENING, Disp: 270 tablet, Rfl: 3   Multiple Vitamins-Minerals (ONE-A-DAY WOMENS 50+ PO), Take by mouth., Disp: , Rfl:    nitroGLYCERIN (NITROSTAT) 0.4 MG SL tablet, Place 1 tablet (0.4 mg total) under the tongue every 5 (five) minutes x 3 doses as needed for chest pain., Disp: 25 tablet, Rfl: 12    rosuvastatin (CRESTOR) 5 MG tablet, Take 1 tablet by mouth daily if tolerated. Please keep September appointment. (Patient not taking: Reported on 09/20/2023), Disp: 15 tablet, Rfl: 0   Objective:     There were no vitals filed for this visit.    There is no height or weight on file to calculate BMI.    Physical Exam:    ***   Electronically signed by:  Sharon Wilson D.[facility] 7:42 AM 09/26/23

## 2023-09-27 ENCOUNTER — Ambulatory Visit (INDEPENDENT_AMBULATORY_CARE_PROVIDER_SITE_OTHER): Payer: Medicare Other

## 2023-09-27 ENCOUNTER — Ambulatory Visit (INDEPENDENT_AMBULATORY_CARE_PROVIDER_SITE_OTHER): Payer: Medicare Other | Admitting: Sports Medicine

## 2023-09-27 VITALS — BP 138/78 | HR 71 | Ht 61.0 in | Wt 128.0 lb

## 2023-09-27 DIAGNOSIS — M79604 Pain in right leg: Secondary | ICD-10-CM

## 2023-09-27 NOTE — Patient Instructions (Signed)
Tylenol [phone] mg 2-3 times a day for pain relief  Knee HEP  voltaren gel over areas of pain  4 week follow up

## 2023-10-18 ENCOUNTER — Other Ambulatory Visit (HOSPITAL_COMMUNITY): Payer: Self-pay | Admitting: Internal Medicine

## 2023-10-18 ENCOUNTER — Other Ambulatory Visit: Payer: Self-pay | Admitting: Internal Medicine

## 2023-10-18 ENCOUNTER — Ambulatory Visit (HOSPITAL_COMMUNITY)
Admission: RE | Admit: 2023-10-18 | Discharge: 2023-10-18 | Disposition: A | Discharge status: Home / Self Care | Payer: Medicare Other | Source: Ambulatory Visit | Attending: Internal Medicine | Admitting: Internal Medicine

## 2023-10-18 ENCOUNTER — Ambulatory Visit (INDEPENDENT_AMBULATORY_CARE_PROVIDER_SITE_OTHER)
Admission: RE | Admit: 2023-10-18 | Discharge: 2023-10-18 | Disposition: A | Discharge status: Home / Self Care | Payer: Medicare Other | Source: Ambulatory Visit | Attending: Vascular Surgery | Admitting: Vascular Surgery

## 2023-10-18 DIAGNOSIS — I739 Peripheral vascular disease, unspecified: Secondary | ICD-10-CM | POA: Insufficient documentation

## 2023-10-18 DIAGNOSIS — M79671 Pain in right foot: Secondary | ICD-10-CM

## 2023-10-18 DIAGNOSIS — M79672 Pain in left foot: Secondary | ICD-10-CM | POA: Diagnosis present

## 2023-10-18 LAB — VAS US ABI WITH/WO TBI
Left ABI: 0.85
Right ABI: 0.49

## 2023-10-18 NOTE — Progress Notes (Signed)
The artery tests of the legs show some obstruction narrowing that could explain your pain . I am going to refer you back to  the vascular team  for these findings   not sure which person you will see .

## 2023-10-24 NOTE — Progress Notes (Unsigned)
    Sharon Wilson D.[facility] [address] [place] [address] Phone: [phone]   Assessment and Plan:     There are no diagnoses linked to this encounter.  ***   Pertinent previous records reviewed include ***    Follow Up: ***     Subjective:   I, Sharon Wilson, am serving as a Neurosurgeon for Doctor Richardean Sale   Chief Complaint: feet and leg pain    HPI:    09/27/2023 Patient is a 82 year old female with concerns of foot and leg pain. Patient states right leg pain . Pain starts in the ankle go to her shin, back of her knee and thigh. Pain increased when she is walking on concrete. Pain for several months. Thinks pain is coming from medication. No MOI. Tylenol for the pain and that helps some.   10/25/2023 Patient states   Relevant Historical Information: History of CVA, DM type II, hypertension, CAD   Additional pertinent review of systems negative.   Current Outpatient Medications:    acetaminophen (TYLENOL) 325 MG tablet, Take 2 tablets (650 mg total) by mouth every 4 (four) hours as needed for mild pain or moderate pain., Disp: , Rfl:    aspirin EC 81 MG tablet, Take 1 tablet (81 mg total) by mouth daily., Disp: , Rfl:    Calcium Carbonate-Vitamin D (OSCAL 500/200 D-3 PO), Take 1 tablet by mouth 2 (two) times daily. , Disp: , Rfl:    carvedilol (COREG) 12.5 MG tablet, Take one tablets by mouth twice daily with meals.or as directed, Disp: 180 tablet, Rfl: 3   clopidogrel (PLAVIX) 75 MG tablet, Take 1 tablet (75 mg total) by mouth daily., Disp: 90 tablet, Rfl: 1   Cyanocobalamin (B-12 PO), Take by mouth., Disp: , Rfl:    ferrous sulfate 325 (65 FE) MG tablet, Take 325 mg by mouth. 1 tab 2x a week., Disp: , Rfl:    isosorbide mononitrate (IMDUR) 30 MG 24 hr tablet, Take 0.5 tablets (15 mg total) by mouth daily., Disp: 45 tablet, Rfl: 3   metFORMIN (GLUCOPHAGE) 500 MG tablet, TAKE ONE TABLET BY MOUTH IN THE MORNING AND TAKE TWO  TABLETS IN THE EVENING, Disp: 270 tablet, Rfl: 3   Multiple Vitamins-Minerals (ONE-A-DAY WOMENS 50+ PO), Take by mouth., Disp: , Rfl:    nitroGLYCERIN (NITROSTAT) 0.4 MG SL tablet, Place 1 tablet (0.4 mg total) under the tongue every 5 (five) minutes x 3 doses as needed for chest pain., Disp: 25 tablet, Rfl: 12   rosuvastatin (CRESTOR) 5 MG tablet, Take 1 tablet by mouth daily if tolerated. Please keep September appointment. (Patient not taking: Reported on 09/20/2023), Disp: 15 tablet, Rfl: 0   Objective:     There were no vitals filed for this visit.    There is no height or weight on file to calculate BMI.    Physical Exam:    ***   Electronically signed by:  Sharon Wilson D.[facility] 7:40 AM 10/24/23

## 2023-10-25 ENCOUNTER — Ambulatory Visit: Payer: Medicare Other | Admitting: Sports Medicine

## 2023-10-25 ENCOUNTER — Ambulatory Visit (INDEPENDENT_AMBULATORY_CARE_PROVIDER_SITE_OTHER): Payer: Medicare Other

## 2023-10-25 VITALS — BP 108/78 | HR 76 | Ht 61.0 in | Wt 123.0 lb

## 2023-10-25 DIAGNOSIS — M79604 Pain in right leg: Secondary | ICD-10-CM

## 2023-10-25 DIAGNOSIS — I70213 Atherosclerosis of native arteries of extremities with intermittent claudication, bilateral legs: Secondary | ICD-10-CM

## 2023-10-25 DIAGNOSIS — M5441 Lumbago with sciatica, right side: Secondary | ICD-10-CM | POA: Diagnosis not present

## 2023-10-25 DIAGNOSIS — G8929 Other chronic pain: Secondary | ICD-10-CM | POA: Diagnosis not present

## 2023-10-25 MED ORDER — GABAPENTIN 100 MG PO CAPS: 100.0000 mg | ORAL_CAPSULE | Freq: Every day | ORAL | 0 refills | Status: DC

## 2023-10-25 NOTE — Patient Instructions (Signed)
Gabapentin 100 mg nightly  Lumbar MRI  Xrays on the way out  Follow up 5 days after to discuss results

## 2023-11-06 NOTE — Progress Notes (Signed)
 Patient name: Sharon Wilson MRN: [id number] DOB: [birth year] Sex: female  REASON FOR VISIT: PAD  HPI: Sharon Wilson is a 82 y.o. female with multiple comorbidities including CAD, hyperlipidemia, diabetes, whitecoat hypertension that presents for evaluation of PAD.  She describes pain in her right foot over the last 3 weeks.  She states it wakes her up at night and she cannot sleep.  She has to get up and walk on it and has more pain.  She states the toes have a numb tingly feeling.  She dangles her leg for some improvement.  ABIs 10/18/2023 are 0.49 on the right monophasic and 0.85 on the left biphasic.  Lower extremity arterial duplex 10/18/2023 shows occlusion of the right popliteal artery with a moderate 50 to 70% stenosis in the left deep femoral artery and popliteal artery.  Patient is well-known to our practice and was previous under carotid surveillance by Dr. Eliza who is retired.  She had a 60 to 79% right ICA stenosis and 40 to 59% left ICA stenosis on last clinic visit on 07/27/2023.   Past Medical History:  Diagnosis Date   Abnormal echocardiogram    a.07/2016 Echo: EF 50-55%, basal and mid inferolateral AK.   Agatston coronary artery calcium  score greater than 400    a. 07/2016 Abnl Echo with basal and mid inferolateral AK, EF 50-55%; b. 09/01/2016 Ex MV: EF 46%, hypertensive response, no ST changes, apical thinning with very mild apical ischemia;  c. 09/21/2016 Cardiac CTA: Ca2+ score = 717, 90th percentile. Extensive CAD w/ mod stenosis in distal LM/ostial LAD, 50% RPLV-->rec Cath.   Anemia    hysterectomy   CAD (coronary artery disease)    a. 09/01/2016 Ex MV: EF 46%, hypertensive response, no ST changes, apical thinning with very mild apical ischemia;  c. 09/21/2016 Cardiac CTA: Ca2+ score = 717, 90th percentile. Extensive CAD w/ mod stenosis in distal LM/ostial LAD, 50% RPLV-->rec Cath   Carotid arterial disease w/ left carotid bruit (HCC)    bilateral 40-59%  stenosis by dopplers 2018   Diverticulosis    Hyperlipidemia    OA (osteoarthritis)    Stroke (cerebrum) (HCC)    a. 07/2016 L facial droop-->Head CT: small area of low attenuation in the right frontoparietal region c/w acute or subacute lacunar infarct; 07/2016 MRI/A brain: acute infarct of right centrum semiovale and possibly posterior limb of right internal capsule. Mild to moderate multifocal narrowing of the MCA arteries w/o occlusion.   Type II diabetes mellitus (HCC)    a. 07/2016 A1c 7.3.   White coat hypertension     Past Surgical History:  Procedure Laterality Date   ABDOMINAL HYSTERECTOMY     fibroid bleeding   CARDIAC CATHETERIZATION N/A 09/23/2016   Procedure: Left Heart Cath and Coronary Angiography;  Surgeon: Lonni Hanson, MD;  Location: [facility] INVASIVE CV LAB;  Service: Cardiovascular;  Laterality: N/A;   CARDIAC CATHETERIZATION N/A 09/23/2016   Procedure: Intravascular Pressure Wire/FFR Study;  Surgeon: Lonni Hanson, MD;  Location: [facility] INVASIVE CV LAB;  Service: Cardiovascular;  Laterality: N/A;   carotid dopplers  11/11   per [facility] clinic   CATARACT EXTRACTION  2011   EAR CYST EXCISION Left 11/25/2019   Procedure: EXCISION LEFT EAR CANAL MASS;  Surgeon: Jesus Oliphant, MD;  Location: [facility];  Service: ENT;  Laterality: Left;    Family History  Problem Relation Age of Onset   Cancer Mother        breast  Breast cancer Mother    CAD Neg Hx     SOCIAL HISTORY: Social History   Tobacco Use   Smoking status: Never   Smokeless tobacco: Never  Substance Use Topics   Alcohol use: No    Alcohol/week: 0.0 standard drinks of alcohol    Allergies  Allergen Reactions   Ace Inhibitors Hives, Swelling, Cough and Other (See Comments)   Amlodipine  Besylate Other (See Comments)    Light headed and vertigo like  Also may have had hives.  Light headed and vertigo like  Also may have had hives.    Lovastatin  Other (See Comments)    Leg cramps,  NO statins     Current Outpatient Medications  Medication Sig Dispense Refill   acetaminophen  (TYLENOL ) 325 MG tablet Take 2 tablets (650 mg total) by mouth every 4 (four) hours as needed for mild pain or moderate pain.     aspirin  EC 81 MG tablet Take 1 tablet (81 mg total) by mouth daily.     Calcium  Carbonate-Vitamin D (OSCAL 500/200 D-3 PO) Take 1 tablet by mouth 2 (two) times daily.      carvedilol  (COREG ) 12.5 MG tablet Take one tablets by mouth twice daily with meals.or as directed 180 tablet 3   clopidogrel  (PLAVIX ) 75 MG tablet Take 1 tablet (75 mg total) by mouth daily. 90 tablet 1   Cyanocobalamin  (B-12 PO) Take by mouth.     ferrous sulfate 325 (65 FE) MG tablet Take 325 mg by mouth. 1 tab 2x a week.     gabapentin  (NEURONTIN ) 100 MG capsule Take 1 capsule (100 mg total) by mouth at bedtime. 30 capsule 0   isosorbide  mononitrate (IMDUR ) 30 MG 24 hr tablet Take 0.5 tablets (15 mg total) by mouth daily. 45 tablet 3   metFORMIN  (GLUCOPHAGE ) 500 MG tablet TAKE ONE TABLET BY MOUTH IN THE MORNING AND TAKE TWO TABLETS IN THE EVENING 270 tablet 3   Multiple Vitamins-Minerals (ONE-A-DAY WOMENS 50+ PO) Take by mouth.     nitroGLYCERIN  (NITROSTAT ) 0.4 MG SL tablet Place 1 tablet (0.4 mg total) under the tongue every 5 (five) minutes x 3 doses as needed for chest pain. 25 tablet 12   rosuvastatin  (CRESTOR ) 5 MG tablet Take 1 tablet by mouth daily if tolerated. Please keep September appointment. 15 tablet 0   No current facility-administered medications for this visit.    REVIEW OF SYSTEMS:  [X]  denotes positive finding,   denotes negative finding Cardiac  Comments:  Chest pain or chest pressure:    Shortness of breath upon exertion:    Short of breath when lying flat:    Irregular heart rhythm:        Vascular    Pain in calf, thigh, or hip brought on by ambulation:    Pain in feet at night that wakes you up from your sleep:  x Right foot  Blood clot in your veins:    Leg swelling:          Pulmonary    Oxygen at home:    Productive cough:     Wheezing:         Neurologic    Sudden weakness in arms or legs:     Sudden numbness in arms or legs:     Sudden onset of difficulty speaking or slurred speech:    Temporary loss of vision in one eye:     Problems with dizziness:         Gastrointestinal  Blood in stool:     Vomited blood:         Genitourinary    Burning when urinating:     Blood in urine:        Psychiatric    Major depression:         Hematologic    Bleeding problems:    Problems with blood clotting too easily:        Skin    Rashes or ulcers:        Constitutional    Fever or chills:      PHYSICAL EXAM: There were no vitals filed for this visit.  GENERAL: The patient is a well-nourished female, in no acute distress. The vital signs are documented above. CARDIAC: There is a regular rate and rhythm.  VASCULAR:  Bilateral femoral pulses palpable No palpable popliteal pulses Right PT monophasic Left PT more brisk and multiphasic PULMONARY: No respiratory distress. ABDOMEN: Soft and non-tender. MUSCULOSKELETAL: There are no major deformities or cyanosis. NEUROLOGIC: No focal weakness or paresthesias are detected. SKIN: There are no ulcers or rashes noted. PSYCHIATRIC: The patient has a normal affect.  DATA:   ABIs 10/18/2023 are 0.49 on the right monophasic and 0.85 on the left biphasic.  Lower extremity arterial duplex 10/18/2023 shows occlusion of the right popliteal artery with a moderate 50 to 70% stenosis in the left deep femoral artery and popliteal artery.  Assessment/Plan:   82 y.o. female with multiple comorbidities including CAD, hyperlipidemia, diabetes, whitecoat hypertension the presents for evaluation of PAD.  She describes pain in her right foot over the last 3 weeks consistent with rest pain.  She gets discomfort when walking and cannot sleep.  She dangles her leg for improvement.  I discussed that her presentation is  concerning for critical limb ischemia with rest pain.  Her duplex does show right popliteal artery occlusion.  Her ABIs on the right have dropped from 1.09 and triphasic on 11/14/18 to now 0.49 monophasic as of 10/18/2023.  Have recommended aortogram, lower extremity arteriogram with a focus on the right leg.  Discussed we will evaluate for endovascular options may ultimately require surgical bypass.  Will get scheduled as soon as possible.  Risk and benefits discussed.     Lonni DOROTHA Gaskins, MD [facility] [place] Office: [phone]

## 2023-11-07 ENCOUNTER — Ambulatory Visit (INDEPENDENT_AMBULATORY_CARE_PROVIDER_SITE_OTHER): Payer: Medicare Other | Admitting: Vascular Surgery

## 2023-11-07 ENCOUNTER — Encounter: Payer: Self-pay | Admitting: Vascular Surgery

## 2023-11-07 VITALS — BP 163/78 | HR 68 | Temp 98.0°F | Resp 18 | Ht 61.0 in | Wt 124.1 lb

## 2023-11-07 DIAGNOSIS — I70221 Atherosclerosis of native arteries of extremities with rest pain, right leg: Secondary | ICD-10-CM | POA: Diagnosis not present

## 2023-11-09 ENCOUNTER — Telehealth: Payer: Self-pay | Admitting: *Deleted

## 2023-11-10 ENCOUNTER — Telehealth: Payer: Self-pay | Admitting: Cardiology

## 2023-11-10 NOTE — Telephone Encounter (Signed)
 Pt would like to do a provider Switch  From: Mayford Knife  To: Cristal Deer

## 2023-11-14 ENCOUNTER — Other Ambulatory Visit: Payer: Self-pay

## 2023-11-14 DIAGNOSIS — I70221 Atherosclerosis of native arteries of extremities with rest pain, right leg: Secondary | ICD-10-CM

## 2023-11-14 NOTE — Telephone Encounter (Signed)
 Patient stopped by office to schedule aortogram procedure for 11/16/23. Instructions provided to patient and she voiced understanding.

## 2023-11-16 ENCOUNTER — Observation Stay (HOSPITAL_COMMUNITY)
Admission: RE | Admit: 2023-11-16 | Discharge: 2023-11-17 | Disposition: A | Discharge status: Home / Self Care | Payer: Medicare Other | Attending: Vascular Surgery | Admitting: Vascular Surgery

## 2023-11-16 ENCOUNTER — Encounter (HOSPITAL_COMMUNITY)
Admission: RE | Disposition: A | Discharge status: Home / Self Care | Payer: Self-pay | Source: Home / Self Care | Attending: Vascular Surgery

## 2023-11-16 DIAGNOSIS — Z8673 Personal history of transient ischemic attack (TIA), and cerebral infarction without residual deficits: Secondary | ICD-10-CM | POA: Insufficient documentation

## 2023-11-16 DIAGNOSIS — I2510 Atherosclerotic heart disease of native coronary artery without angina pectoris: Secondary | ICD-10-CM | POA: Diagnosis not present

## 2023-11-16 DIAGNOSIS — E119 Type 2 diabetes mellitus without complications: Secondary | ICD-10-CM | POA: Diagnosis not present

## 2023-11-16 DIAGNOSIS — Z79899 Other long term (current) drug therapy: Secondary | ICD-10-CM | POA: Insufficient documentation

## 2023-11-16 DIAGNOSIS — Z7902 Long term (current) use of antithrombotics/antiplatelets: Secondary | ICD-10-CM | POA: Insufficient documentation

## 2023-11-16 DIAGNOSIS — Z7982 Long term (current) use of aspirin: Secondary | ICD-10-CM | POA: Insufficient documentation

## 2023-11-16 DIAGNOSIS — I10 Essential (primary) hypertension: Secondary | ICD-10-CM | POA: Insufficient documentation

## 2023-11-16 DIAGNOSIS — I739 Peripheral vascular disease, unspecified: Secondary | ICD-10-CM | POA: Diagnosis present

## 2023-11-16 DIAGNOSIS — Z7984 Long term (current) use of oral hypoglycemic drugs: Secondary | ICD-10-CM | POA: Insufficient documentation

## 2023-11-16 DIAGNOSIS — I70221 Atherosclerosis of native arteries of extremities with rest pain, right leg: Secondary | ICD-10-CM

## 2023-11-16 HISTORY — PX: ABDOMINAL AORTOGRAM W/LOWER EXTREMITY: CATH118223

## 2023-11-16 HISTORY — PX: PERIPHERAL VASCULAR INTERVENTION: CATH118257

## 2023-11-16 HISTORY — PX: PERIPHERAL VASCULAR BALLOON ANGIOPLASTY: CATH118281

## 2023-11-16 LAB — POCT I-STAT, CHEM 8
BUN: 14 mg/dL (ref 8–23)
Calcium, Ion: 1.16 mmol/L (ref 1.15–1.40)
Chloride: 102 mmol/L (ref 98–111)
Creatinine, Ser: 1 mg/dL (ref 0.44–1.00)
Glucose, Bld: 159 mg/dL — ABNORMAL HIGH (ref 70–99)
HCT: 34 % — ABNORMAL LOW (ref 36.0–46.0)
Hemoglobin: 11.6 g/dL — ABNORMAL LOW (ref 12.0–15.0)
Potassium: 4 mmol/L (ref 3.5–5.1)
Sodium: 139 mmol/L (ref 135–145)
TCO2: 25 mmol/L (ref 22–32)

## 2023-11-16 LAB — GLUCOSE, CAPILLARY: Glucose-Capillary: 156 mg/dL — ABNORMAL HIGH (ref 70–99)

## 2023-11-16 SURGERY — ABDOMINAL AORTOGRAM W/LOWER EXTREMITY
Anesthesia: LOCAL | Laterality: Right

## 2023-11-16 MED ORDER — ACETAMINOPHEN 325 MG PO TABS: 650.0000 mg | ORAL_TABLET | ORAL | Status: DC | PRN

## 2023-11-16 MED ORDER — SODIUM CHLORIDE 0.9% FLUSH: 3.0000 mL | Freq: Two times a day (BID) | INTRAVENOUS | Status: DC

## 2023-11-16 MED ORDER — LABETALOL HCL 5 MG/ML IV SOLN: 10.0000 mg | INTRAVENOUS | Status: DC | PRN

## 2023-11-16 MED ORDER — CARVEDILOL 12.5 MG PO TABS: 12.5000 mg | ORAL_TABLET | Freq: Once | ORAL | Status: DC

## 2023-11-16 MED ORDER — ASPIRIN 81 MG PO TBEC
81.0000 mg | DELAYED_RELEASE_TABLET | Freq: Every day | ORAL | Status: DC
Start: 2023-11-17 — End: 2023-11-17
  Administered 2023-11-17: 81 mg via ORAL
  Filled 2023-11-16: qty 1

## 2023-11-16 MED ORDER — MIDAZOLAM HCL 2 MG/2ML IJ SOLN
INTRAMUSCULAR | Status: DC | PRN
  Administered 2023-11-16: 1 mg via INTRAVENOUS

## 2023-11-16 MED ORDER — SODIUM CHLORIDE 0.9 % IV BOLUS: 600.0000 mL | Freq: Once | INTRAVENOUS | Status: DC

## 2023-11-16 MED ORDER — LABETALOL HCL 5 MG/ML IV SOLN
INTRAVENOUS | Status: DC | PRN
  Administered 2023-11-16: 10 mg via INTRAVENOUS

## 2023-11-16 MED ORDER — SODIUM CHLORIDE 0.9 % IV SOLN
250.0000 mL | INTRAVENOUS | Status: DC | PRN
Start: 2023-11-16 — End: 2023-11-16

## 2023-11-16 MED ORDER — HEPARIN SODIUM (PORCINE) 1000 UNIT/ML IJ SOLN
INTRAMUSCULAR | Status: DC | PRN
  Administered 2023-11-16: 9000 [IU] via INTRAVENOUS

## 2023-11-16 MED ORDER — SODIUM CHLORIDE 0.9 % IV SOLN: INTRAVENOUS | Status: DC

## 2023-11-16 MED ORDER — LIDOCAINE HCL (PF) 1 % IJ SOLN
INTRAMUSCULAR | Status: AC
  Filled 2023-11-16: qty 30

## 2023-11-16 MED ORDER — SODIUM CHLORIDE 0.9 % IV SOLN: INTRAVENOUS | Status: AC

## 2023-11-16 MED ORDER — ONDANSETRON HCL 4 MG/2ML IJ SOLN: 4.0000 mg | Freq: Four times a day (QID) | INTRAMUSCULAR | Status: DC | PRN

## 2023-11-16 MED ORDER — CLOPIDOGREL BISULFATE 75 MG PO TABS: 75.0000 mg | ORAL_TABLET | Freq: Every day | ORAL | 4 refills | Status: DC

## 2023-11-16 MED ORDER — HYDRALAZINE HCL 20 MG/ML IJ SOLN: 5.0000 mg | INTRAMUSCULAR | Status: DC | PRN

## 2023-11-16 MED ORDER — CLOPIDOGREL BISULFATE 300 MG PO TABS
ORAL_TABLET | ORAL | Status: AC
  Filled 2023-11-16: qty 1

## 2023-11-16 MED ORDER — CLOPIDOGREL BISULFATE 75 MG PO TABS
300.0000 mg | ORAL_TABLET | Freq: Once | ORAL | Status: DC
Start: 2023-11-16 — End: 2023-11-16

## 2023-11-16 MED ORDER — HEPARIN SODIUM (PORCINE) 1000 UNIT/ML IJ SOLN
INTRAMUSCULAR | Status: AC
  Filled 2023-11-16: qty 10

## 2023-11-16 MED ORDER — SODIUM CHLORIDE 0.9 % IV SOLN: 250.0000 mL | INTRAVENOUS | Status: DC | PRN

## 2023-11-16 MED ORDER — CLOPIDOGREL BISULFATE 75 MG PO TABS
75.0000 mg | ORAL_TABLET | Freq: Every day | ORAL | Status: DC
  Administered 2023-11-17: 75 mg via ORAL
  Filled 2023-11-16: qty 1

## 2023-11-16 MED ORDER — FENTANYL CITRATE (PF) 100 MCG/2ML IJ SOLN
INTRAMUSCULAR | Status: AC
  Filled 2023-11-16: qty 2

## 2023-11-16 MED ORDER — SODIUM CHLORIDE 0.9% FLUSH
3.0000 mL | Freq: Two times a day (BID) | INTRAVENOUS | Status: DC
  Administered 2023-11-17 (×2): 3 mL via INTRAVENOUS

## 2023-11-16 MED ORDER — SODIUM CHLORIDE 0.9% FLUSH: 3.0000 mL | INTRAVENOUS | Status: DC | PRN

## 2023-11-16 MED ORDER — ONDANSETRON HCL 4 MG/2ML IJ SOLN
4.0000 mg | Freq: Four times a day (QID) | INTRAMUSCULAR | Status: DC | PRN
  Administered 2023-11-16: 4 mg via INTRAVENOUS
  Filled 2023-11-16: qty 2

## 2023-11-16 MED ORDER — CLOPIDOGREL BISULFATE 300 MG PO TABS
ORAL_TABLET | ORAL | Status: DC | PRN
  Administered 2023-11-16: 300 mg via ORAL

## 2023-11-16 MED ORDER — FENTANYL CITRATE (PF) 100 MCG/2ML IJ SOLN
INTRAMUSCULAR | Status: DC | PRN
  Administered 2023-11-16 (×2): 25 ug via INTRAVENOUS

## 2023-11-16 MED ORDER — MIDAZOLAM HCL 2 MG/2ML IJ SOLN
INTRAMUSCULAR | Status: AC
Start: 2023-11-16 — End: ?
  Filled 2023-11-16: qty 2

## 2023-11-16 MED ORDER — LIDOCAINE HCL (PF) 1 % IJ SOLN
INTRAMUSCULAR | Status: DC | PRN
  Administered 2023-11-16: 5 mL via INTRADERMAL

## 2023-11-16 MED ORDER — CLOPIDOGREL BISULFATE 75 MG PO TABS: 75.0000 mg | ORAL_TABLET | Freq: Every day | ORAL | Status: DC

## 2023-11-16 SURGICAL SUPPLY — 23 items
BALLN MUSTANG 5X150X135 (BALLOONS) ×2
BALLN STERLING SL OTW 3X80X150 (BALLOONS) ×4
BALLOON MUSTANG 5X150X135 (BALLOONS) IMPLANT
BALLOON STRLNG SL OTW 3X80X150 (BALLOONS) IMPLANT
CATH OMNI FLUSH 5F 65CM (CATHETERS) IMPLANT
CATH QUICKCROSS SUPP .035X90CM (MICROCATHETER) IMPLANT
CLOSURE MYNX CONTROL 6F/7F (Vascular Products) IMPLANT
DEVICE CLOSURE MYNXGRIP 6/7F (Vascular Products) IMPLANT
GLIDEWIRE ADV .035X260CM (WIRE) IMPLANT
KIT ENCORE 26 ADVANTAGE (KITS) IMPLANT
KIT MICROPUNCTURE NIT STIFF (SHEATH) IMPLANT
SET ATX-X65L (MISCELLANEOUS) IMPLANT
SHEATH CATAPULT 5F 45 MP (SHEATH) IMPLANT
SHEATH CATAPULT 6FR 45 (SHEATH) IMPLANT
SHEATH PINNACLE 5F 10CM (SHEATH) IMPLANT
SHEATH PINNACLE 6F 10CM (SHEATH) IMPLANT
SHEATH PROBE COVER 6X72 (BAG) IMPLANT
STENT ELUVIA 6X150X130 (Permanent Stent) IMPLANT
STENT ELUVIA 6X80X130 (Permanent Stent) IMPLANT
TRANSDUCER W/STOPCOCK (MISCELLANEOUS) ×3 IMPLANT
TRAY PV CATH (CUSTOM PROCEDURE TRAY) ×3 IMPLANT
WIRE BENTSON .035X145CM (WIRE) IMPLANT
WIRE G V18X300CM (WIRE) IMPLANT

## 2023-11-16 NOTE — Progress Notes (Signed)
 Report called to [place], [place]

## 2023-11-16 NOTE — H&P (Signed)
 History and Physical Interval Note:  11/16/2023 9:58 AM  Sharon Wilson  has presented today for surgery, with the diagnosis of Critical limb ischemia of right lower extremity.  The various methods of treatment have been discussed with the patient and family. After consideration of risks, benefits and other options for treatment, the patient has consented to  Procedure(s): ABDOMINAL AORTOGRAM W/LOWER EXTREMITY (N/A) as a surgical intervention.  The patient's history has been reviewed, patient examined, no change in status, stable for surgery.  I have reviewed the patient's chart and labs.  Questions were answered to the patient's satisfaction.     Sharon Wilson     Patient name: Sharon Wilson    MRN: [id number]        DOB: [birth year]          Sex: female   REASON FOR VISIT: PAD   HPI: Sharon Wilson is a 83 y.o. female with multiple comorbidities including CAD, hyperlipidemia, diabetes, whitecoat hypertension that presents for evaluation of PAD.  She describes pain in her right foot over the last 3 weeks.  She states it wakes her up at night and she cannot sleep.  She has to get up and walk on it and has more pain.  She states the toes have a numb tingly feeling.  She dangles her leg for some improvement.   ABIs 10/18/2023 are 0.49 on the right monophasic and 0.85 on the left biphasic.  Lower extremity arterial duplex 10/18/2023 shows occlusion of the right popliteal artery with a moderate 50 to 70% stenosis in the left deep femoral artery and popliteal artery.   Patient is well-known to our practice and was previous under carotid surveillance by Dr. Eliza who is retired.  She had a 60 to 79% right ICA stenosis and 40 to 59% left ICA stenosis on last clinic visit on 07/27/2023.         Past Medical History:  Diagnosis Date   Abnormal echocardiogram      a.07/2016 Echo: EF 50-55%, basal and mid inferolateral AK.   Agatston coronary artery calcium  score greater than 400       a. 07/2016 Abnl Echo with basal and mid inferolateral AK, EF 50-55%; b. 09/01/2016 Ex MV: EF 46%, hypertensive response, no ST changes, apical thinning with very mild apical ischemia;  c. 09/21/2016 Cardiac CTA: Ca2+ score = 717, 90th percentile. Extensive CAD w/ mod stenosis in distal LM/ostial LAD, 50% RPLV-->rec Cath.   Anemia      hysterectomy   CAD (coronary artery disease)      a. 09/01/2016 Ex MV: EF 46%, hypertensive response, no ST changes, apical thinning with very mild apical ischemia;  c. 09/21/2016 Cardiac CTA: Ca2+ score = 717, 90th percentile. 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 07/2016 L facial droop-->Head CT: small area of low attenuation in the right frontoparietal region c/w acute or subacute lacunar infarct; 07/2016 MRI/A brain: acute infarct of right centrum semiovale and possibly posterior limb of right internal capsule. Mild to moderate multifocal narrowing of the MCA arteries w/o occlusion.   Type II diabetes mellitus (HCC)      a. 07/2016 A1c 7.3.   White  coat hypertension                 Past Surgical History:  Procedure Laterality Date   ABDOMINAL HYSTERECTOMY        fibroid bleeding   CARDIAC CATHETERIZATION N/A 09/23/2016    Procedure: Left Heart Cath and Coronary Angiography;  Surgeon: Sharon Hanson, MD;  Location: [facility] INVASIVE CV LAB;  Service: Cardiovascular;  Laterality: N/A;   CARDIAC CATHETERIZATION N/A 09/23/2016    Procedure: Intravascular Pressure Wire/FFR Study;  Surgeon: Sharon Hanson, MD;  Location: [facility] INVASIVE CV LAB;  Service: Cardiovascular;  Laterality: N/A;   carotid dopplers   11/11    per [facility] clinic   CATARACT EXTRACTION   2011   EAR CYST EXCISION Left 11/25/2019    Procedure: EXCISION LEFT EAR CANAL  MASS;  Surgeon: Sharon Oliphant, MD;  Location: [facility];  Service: ENT;  Laterality: Left;               Family History  Problem Relation Age of Onset   Cancer Mother          breast   Breast cancer Mother     CAD Neg Hx            SOCIAL HISTORY: Social History         Tobacco Use   Smoking status: Never   Smokeless tobacco: Never  Substance Use Topics   Alcohol use: No      Alcohol/week: 0.0 standard drinks of alcohol      Allergies       Allergies  Allergen Reactions   Ace Inhibitors Hives, Swelling, Cough and Other (See Comments)   Amlodipine  Besylate Other (See Comments)      Light headed and vertigo like  Also may have had hives.  Light headed and vertigo like  Also may have had hives.    Lovastatin  Other (See Comments)      Leg cramps,  NO statins              Current Outpatient Medications  Medication Sig Dispense Refill   acetaminophen  (TYLENOL ) 325 MG tablet Take 2 tablets (650 mg total) by mouth every 4 (four) hours as needed for mild pain or moderate pain.       aspirin  EC 81 MG tablet Take 1 tablet (81 mg total) by mouth daily.       Calcium  Carbonate-Vitamin D (OSCAL 500/200 D-3 PO) Take 1 tablet by mouth 2 (two) times daily.        carvedilol  (COREG ) 12.5 MG tablet Take one tablets by mouth twice daily with meals.or as directed 180 tablet 3   clopidogrel  (PLAVIX ) 75 MG tablet Take 1 tablet (75 mg total) by mouth daily. 90 tablet 1   Cyanocobalamin  (B-12 PO) Take by mouth.       ferrous sulfate 325 (65 FE) MG tablet Take 325 mg by mouth. 1 tab 2x a week.       gabapentin  (NEURONTIN ) 100 MG capsule Take 1 capsule (100 mg total) by mouth at bedtime. 30 capsule 0   isosorbide  mononitrate (IMDUR ) 30 MG 24 hr tablet Take 0.5 tablets (15 mg total) by mouth daily. 45 tablet 3   metFORMIN  (GLUCOPHAGE ) 500 MG tablet TAKE ONE TABLET BY MOUTH IN THE MORNING AND TAKE TWO TABLETS IN THE EVENING 270 tablet 3   Multiple Vitamins-Minerals (ONE-A-DAY  WOMENS 50+ PO) Take by mouth.       nitroGLYCERIN  (NITROSTAT ) 0.4 MG SL tablet Place  1 tablet (0.4 mg total) under the tongue every 5 (five) minutes x 3 doses as needed for chest pain. 25 tablet 12   rosuvastatin  (CRESTOR ) 5 MG tablet Take 1 tablet by mouth daily if tolerated. Please keep September appointment. 15 tablet 0      No current facility-administered medications for this visit.        REVIEW OF SYSTEMS:  [X]  denotes positive finding,   denotes negative finding Cardiac   Comments:  Chest pain or chest pressure:      Shortness of breath upon exertion:      Short of breath when lying flat:      Irregular heart rhythm:             Vascular      Pain in calf, thigh, or hip brought on by ambulation:      Pain in feet at night that wakes you up from your sleep:  x Right foot  Blood clot in your veins:      Leg swelling:              Pulmonary      Oxygen at home:      Productive cough:       Wheezing:              Neurologic      Sudden weakness in arms or legs:       Sudden numbness in arms or legs:       Sudden onset of difficulty speaking or slurred speech:      Temporary loss of vision in one eye:       Problems with dizziness:              Gastrointestinal      Blood in stool:       Vomited blood:              Genitourinary      Burning when urinating:       Blood in urine:             Psychiatric      Major depression:              Hematologic      Bleeding problems:      Problems with blood clotting too easily:             Skin      Rashes or ulcers:             Constitutional      Fever or chills:          PHYSICAL EXAM: There were no vitals filed for this visit.   GENERAL: The patient is a well-nourished female, in no acute distress. The vital signs are documented above. CARDIAC: There is a regular rate and rhythm.  VASCULAR:  Bilateral femoral pulses palpable No palpable popliteal pulses Right PT monophasic Left PT more brisk and  multiphasic PULMONARY: No respiratory distress. ABDOMEN: Soft and non-tender. MUSCULOSKELETAL: There are no major deformities or cyanosis. NEUROLOGIC: No focal weakness or paresthesias are detected. SKIN: There are no ulcers or rashes noted. PSYCHIATRIC: The patient has a normal affect.   DATA:    ABIs 10/18/2023 are 0.49 on the right monophasic and 0.85 on the left biphasic.   Lower extremity arterial duplex 10/18/2023 shows occlusion of the right popliteal artery with a moderate 50 to 70% stenosis in the left deep femoral artery and popliteal artery.   Assessment/Plan:  83 y.o. female with multiple comorbidities including CAD, hyperlipidemia, diabetes, whitecoat hypertension the presents for evaluation of PAD.  She describes pain in her right foot over the last 3 weeks consistent with rest pain.  She gets discomfort when walking and cannot sleep.  She dangles her leg for improvement.  I discussed that her presentation is concerning for critical limb ischemia with rest pain.  Her duplex does show right popliteal artery occlusion.  Her ABIs on the right have dropped from 1.09 and triphasic on 11/14/18 to now 0.49 monophasic as of 10/18/2023.  Have recommended aortogram, lower extremity arteriogram with a focus on the right leg.  Discussed we will evaluate for endovascular options may ultimately require surgical bypass.  Will get scheduled as soon as possible.  Risk and benefits discussed.         Sharon DOROTHA Gaskins, MD [facility] [place] Office: [phone]

## 2023-11-16 NOTE — Progress Notes (Signed)
 Patient underwent right SFA stenting as well as right TP trunk peroneal angioplasty today for CLI with rest pain.  Her procedure was uncomplicated and we used a mynx closure device in the left groin.  In recovery when it was time to get up and walk she got nauseated and vomited and then had an episode of hypotension.  Her groin looks fine.  I have held pressure for another 20 minutes out of precaution.  Still feels dizzy when she gets up.  Will admit overnight for observation.  Will plan IV hydration tonight.  Zofran  ordered.  Discussed she be on bed rest tonight but can sit up.  Will check labs in the morning.  Her pressures are now 130 systolic and she has no complaints.  Lonni DOROTHA Gaskins, MD [facility] [place] Office: [phone]   [name]

## 2023-11-16 NOTE — Progress Notes (Signed)
 Patient received by Judeth Cornfield, RN (CN)

## 2023-11-16 NOTE — Op Note (Signed)
 Patient name: Sharon Wilson MRN: [id number] DOB: Mar 12, 1941 Sex: female  11/16/2023 Pre-operative Diagnosis: Critical limb ischemia of right lower extremity with rest pain Post-operative diagnosis:  Same Surgeon:  Lonni DOROTHA Gaskins, MD Procedure Performed: 1.  Ultrasound-guided access left common femoral artery 2.  Aortogram with catheter selection of aorta 3.  Right lower extremity arteriogram including catheter selection of right SFA and peroneal artery 4.  Right above-knee popliteal artery and SFA angioplasty with stent placement x 2 (6 mm x 150 mm drug-coated Eluvia and 6 mm x 80 mm drug-coated Eluvia postdilated with a 5 mm Mustang) 5.  Right TP trunk and peroneal angioplasty (3 mm x 80 mm Sterling) 6.  Mynx closure of the left common femoral artery 7.  54 minutes of monitored moderate conscious sedation time  Indications: Patient is a 83 year old female seen with critical limb ischemia with rest pain in the right lower extremity with an ABI of 0.49 monophasic at the ankle.  She presents for lower extremity arteriogram possible invention with a focus on the right leg after risks and  benefits discussed.  Findings: Aortogram showed patent renal arteries with a patent infrarenal aorta and both iliacs were patent bilaterally without flow limiting stenosis.  On the right she had a patent common femoral and profunda.  The SFA was diffusely diseased.  The mid SFA had a 70% calcified stenosis.  The above-knee popliteal artery was occluded for a short segment that reconstituted just above the knee joint.  Distally she had a patent below-knee popliteal artery with a patent TP trunk and single-vessel runoff in the peroneal artery.  The TP trunk and proximal peroneal artery was diffusely diseased with multiple segments of 50 to 70% stenosis.  Distally the peroneal artery gives a collateral to the DP into the foot.  Ultimately the right above-knee popliteal occlusion was crossed and we  placed a 6 mm x 150 mm Eluvia and then extended with a second 6 mm x 80 mm Eluvia into the mid SFA proximally to treat all the disease.  This was all postdilated with a 5 mm balloon and widely patent at completion.  I then went down and got a wire in the peroneal and then treated the proximal peroneal and TP trunk with a 3 mm Sterling.  Now has inline flow through the peroneal artery with no significant residual stenosis.   Procedure:  The patient was identified in the holding area and taken to room 8.  The patient was then placed supine on the table and prepped and draped in the usual sterile fashion.  A time out was called.  Patient received Versed  and fentanyl  for conscious moderate sedation.  Vital signs were monitored including heart rate, respiratory rate, oxygenation and blood pressure.  I was present for all moderate sedation.  Ultrasound was used to evaluate the left common femoral artery.  It was patent .  A digital ultrasound image was acquired.  A micropuncture needle was used to access the left common femoral artery under ultrasound guidance.  An 018 wire was advanced without resistance and a micropuncture sheath was placed.  The 018 wire was removed and a benson wire was placed.  The micropuncture sheath was exchanged for a 5 french sheath.  An omniflush catheter was advanced over the wire to the level of L-1.  An abdominal angiogram was obtained.  Next, using the omniflush catheter and a benson wire, the aortic bifurcation was crossed and the catheter was placed into theright  external iliac artery and right runoff was obtained.  After evaluating images elected for intervention.  I upsized to a 6 French catapult sheath in the left groin over the aortic bifurcation.  Patient was given 100 units/kg IV heparin .  I then used a Glidewire advantage with a quick cross catheter to get down the right SFA and I was able to cross the right above-knee popliteal occlusion and stay in the true lumen distally.  I  then got a V18 wire down the peroneal artery with single-vessel runoff.  The above-knee popliteal occlusion and distal to mid SFA was then stented with multiple Eluvia stents as noted above postdilated with a 5 mm Mustang.  Widely patent stents at completion.  I then went down and treated the TP trunk proximal peroneal artery with a 3 mm angioplasty balloon for 2 minutes.  Preserved single-vessel runoff.  Wires and catheters were removed.  Short 6 French sheath in the left groin.  Mynx closure deployed.  Plan: Patient states she has not been taking Plavix  in over a year.  Will start Plavix  plus aspirin  and statin.  Follow-up in 1 month with arterial duplex and ABIs.  Lonni DOROTHA Gaskins, MD [facility] [place] Office: [phone]

## 2023-11-17 ENCOUNTER — Encounter (HOSPITAL_COMMUNITY): Payer: Self-pay | Admitting: Vascular Surgery

## 2023-11-17 DIAGNOSIS — I70221 Atherosclerosis of native arteries of extremities with rest pain, right leg: Secondary | ICD-10-CM | POA: Diagnosis not present

## 2023-11-17 DIAGNOSIS — I739 Peripheral vascular disease, unspecified: Secondary | ICD-10-CM | POA: Diagnosis not present

## 2023-11-17 LAB — BASIC METABOLIC PANEL
Anion gap: 8 (ref 5–15)
BUN: 17 mg/dL (ref 8–23)
CO2: 25 mmol/L (ref 22–32)
Calcium: 8.5 mg/dL — ABNORMAL LOW (ref 8.9–10.3)
Chloride: 108 mmol/L (ref 98–111)
Creatinine, Ser: 0.96 mg/dL (ref 0.44–1.00)
GFR, Estimated: 59 mL/min — ABNORMAL LOW (ref >60)
Glucose, Bld: 131 mg/dL — ABNORMAL HIGH (ref 70–99)
Potassium: 4.1 mmol/L (ref 3.5–5.1)
Sodium: 141 mmol/L (ref 135–145)

## 2023-11-17 LAB — CBC
HCT: 27 % — ABNORMAL LOW (ref 36.0–46.0)
Hemoglobin: 8.9 g/dL — ABNORMAL LOW (ref 12.0–15.0)
MCH: 31.3 pg (ref 26.0–34.0)
MCHC: 33 g/dL (ref 30.0–36.0)
MCV: 95.1 fL (ref 80.0–100.0)
Platelets: 177 10*3/uL (ref 150–400)
RBC: 2.84 MIL/uL — ABNORMAL LOW (ref 3.87–5.11)
RDW: 12.5 % (ref 11.5–15.5)
WBC: 8.3 10*3/uL (ref 4.0–10.5)
nRBC: 0 % (ref 0.0–0.2)

## 2023-11-17 MED ORDER — METFORMIN HCL 500 MG PO TABS: ORAL_TABLET | ORAL | Status: DC

## 2023-11-17 NOTE — Discharge Instructions (Signed)
   [facility]  Discharge Instructions  Lower Extremity Angiogram; Angioplasty/Stenting  Please refer to the following instructions for your post-procedure care. Your surgeon or physician assistant will discuss any changes with you.  Activity  Avoid lifting more than 8 pounds (1 gallons of milk) for 72 hours (3 days) after your procedure. You may walk as much as you can tolerate. It's OK to drive after 72 hours.  Bathing/Showering  You may shower the day after your procedure. If you have a bandage, you may remove it at 24- 48 hours. Clean your incision site with mild soap and water. Pat the area dry with a clean towel.  Diet  Resume your pre-procedure diet. There are no special food restrictions following this procedure. All patients with peripheral vascular disease should follow a low fat/low cholesterol diet. In order to heal from your surgery, it is CRITICAL to get adequate nutrition. Your body requires vitamins, minerals, and protein. Vegetables are the best source of vitamins and minerals. Vegetables also provide the perfect balance of protein. Processed food has little nutritional value, so try to avoid this.  Medications  Resume taking all of your medications unless your doctor tells you not to. If your incision is causing pain, you may take over-the-counter pain relievers such as acetaminophen  (Tylenol )  Resume Metformin  on 11/18/2023  Follow Up  Follow up will be arranged at the time of your procedure. You may have an office visit scheduled or may be scheduled for surgery. Ask your surgeon if you have any questions.  Please call us  immediately for any of the following conditions: Severe or worsening pain your legs or feet at rest or with walking. Increased pain, redness, drainage at your groin puncture site. Fever of 101 degrees or higher. If you have any mild or slow bleeding from your puncture site: lie down, apply firm constant pressure  over the area with a piece of gauze or a clean wash cloth for 30 minutes- no peeking!, call 911 right away if you are still bleeding after 30 minutes, or if the bleeding is heavy and unmanageable.  Reduce your risk factors of vascular disease:  Stop smoking. If you would like help call [facility] at 1-800-QUIT-NOW ((678)876-2228) or [place] at ([phone]. Manage your cholesterol Maintain a desired weight Control your diabetes Keep your blood pressure down  If you have any questions, please call the office at [phone]

## 2023-11-17 NOTE — Discharge Summary (Addendum)
 Discharge Summary    Sharon Wilson 07-05-1941 83 y.o. female  [id number]  Admission Date: 11/16/2023  Discharge Date: 11/17/2023  Physician: No att. providers found  Admission Diagnosis: PAD (peripheral artery disease) (HCC) [I73.9]   HPI:   This is a 83 y.o. female with multiple comorbidities including CAD, hyperlipidemia, diabetes, whitecoat hypertension that presents for evaluation of PAD.  She describes pain in her right foot over the last 3 weeks.  She states it wakes her up at night and she cannot sleep.  She has to get up and walk on it and has more pain.  She states the toes have a numb tingly feeling.  She dangles her leg for some improvement.   ABIs 10/18/2023 are 0.49 on the right monophasic and 0.85 on the left biphasic.  Lower extremity arterial duplex 10/18/2023 shows occlusion of the right popliteal artery with a moderate 50 to 70% stenosis in the left deep femoral artery and popliteal artery.   Patient is well-known to our practice and was previous under carotid surveillance by Dr. Eliza who is retired.  She had a 60 to 79% right ICA stenosis and 40 to 59% left ICA stenosis on last clinic visit on 07/27/2023.  Hospital Course:  The patient was admitted to the hospital and taken to the [facility] lab on 11/16/2023 and underwent: Angiogram with right AK popliteal and SFA angioplasty with stenting, right TP trunk peroneal angioplasty via left CFA for CLI with rest pain     Findings as follows: Aortogram showed patent renal arteries with a patent infrarenal aorta and both iliacs were patent bilaterally without flow limiting stenosis.   On the right she had a patent common femoral and profunda.  The SFA was diffusely diseased.  The mid SFA had a 70% calcified stenosis.  The above-knee popliteal artery was occluded for a short segment that reconstituted just above the knee joint.  Distally she had a patent below-knee popliteal artery with a patent TP trunk and single-vessel  runoff in the peroneal artery.  The TP trunk and proximal peroneal artery was diffusely diseased with multiple segments of 50 to 70% stenosis.  Distally the peroneal artery gives a collateral to the DP into the foot.   Ultimately the right above-knee popliteal occlusion was crossed and we placed a 6 mm x 150 mm Eluvia and then extended with a second 6 mm x 80 mm Eluvia into the mid SFA proximally to treat all the disease.  This was all postdilated with a 5 mm balloon and widely patent at completion.  I then went down and got a wire in the peroneal and then treated the proximal peroneal and TP trunk with a 3 mm Sterling.  Now has inline flow through the peroneal artery with no significant residual stenosis.  The pt tolerated the procedure well and was transported to the PACU in good condition.   Later in the day, in recovery when it was time to get up and walk she got nauseated and vomited and then had an episode of hypotension.  Her groin looks fine.  I have held pressure for another 20 minutes out of precaution.  Still feels dizzy when she gets up.  Will admit overnight for observation.  Will plan IV hydration tonight.  Zofran  ordered.  Discussed she be on bed rest tonight but can sit up.  Will check labs in the morning.  Her pressures are now 130 systolic and she has no complaints.   By the next morning,  her nausea was resolved.  She was mobilized.  She did have acute blood loss anemia and was tolerating.  She was not placed on statin due to intolerance.    CBC    Component Value Date/Time   WBC 8.3 11/17/2023 0314   RBC 2.84 (L) 11/17/2023 0314   HGB 8.9 (L) 11/17/2023 0314   HCT 27.0 (L) 11/17/2023 0314   PLT 177 11/17/2023 0314   MCV 95.1 11/17/2023 0314   MCH 31.3 11/17/2023 0314   MCHC 33.0 11/17/2023 0314   RDW 12.5 11/17/2023 0314   LYMPHSABS 0.8 09/13/2023 0757   MONOABS 0.4 09/13/2023 0757   EOSABS 0.5 09/13/2023 0757   BASOSABS 0.0 09/13/2023 0757    BMET    Component Value  Date/Time   NA 141 11/17/2023 0314   K 4.1 11/17/2023 0314   CL 108 11/17/2023 0314   CO2 25 11/17/2023 0314   GLUCOSE 131 (H) 11/17/2023 0314   BUN 17 11/17/2023 0314   CREATININE 0.96 11/17/2023 0314   CREATININE 1.00 (H) 07/03/2020 0804   CALCIUM  8.5 (L) 11/17/2023 0314   GFRNONAA 59 (L) 11/17/2023 0314   GFRAA >60 11/25/2019 1015      Discharge Instructions     Discharge patient   Complete by: As directed    Discharge home after she has walked and been seen by Dr. Gretta.   Discharge disposition: 01-Home or Self Care   Discharge patient date: 11/17/2023       Discharge Diagnosis:  PAD (peripheral artery disease) (HCC) [I73.9]  Secondary Diagnosis: Patient Active Problem List   Diagnosis Date Noted   PAD (peripheral artery disease) (HCC) 11/16/2023   Critical limb ischemia of right lower extremity (HCC) 11/07/2023   Peripheral arterial disease (HCC) 11/16/2018   Atherosclerosis of native arteries of extremity with intermittent claudication (HCC) 04/11/2018   History of stroke 07/25/2017   CAD (coronary artery disease), native coronary artery 02/02/2017   Abnormal stress test 09/23/2016   Abnormal echocardiogram 08/22/2016   Dysarthria 07/20/2016   Estrogen deficiency 08/06/2014   Medicare annual wellness visit, initial 08/06/2014   Statin intolerance 08/06/2014   Fatigue 03/17/2014   Visit for preventive health examination 02/08/2013   Allergic reaction caused by a drug 05/18/2012   Allergy to angiotensin-converting enzyme (ACE) inhibitors 05/18/2012   ADVERSE REACTION TO MEDICATION 11/26/2010   SKIN LESION, UNCERTAIN SIGNIFICANCE 10/25/2010   SINUS TACHYCARDIA 08/01/2007   HLD (hyperlipidemia) 05/01/2007   DIVERTICULOSIS, COLON 05/01/2007   History of colonic polyps 05/01/2007   Non-insulin  dependent type 2 diabetes mellitus (HCC) 03/14/2007   Essential hypertension 03/14/2007   OSTEOARTHRITIS 03/14/2007   Past Medical History:  Diagnosis Date   Abnormal  echocardiogram    a.07/2016 Echo: EF 50-55%, basal and mid inferolateral AK.   Agatston coronary artery calcium  score greater than 400    a. 07/2016 Abnl Echo with basal and mid inferolateral AK, EF 50-55%; b. 09/01/2016 Ex MV: EF 46%, hypertensive response, no ST changes, apical thinning with very mild apical ischemia;  c. 09/21/2016 Cardiac CTA: Ca2+ score = 717, 90th percentile. Extensive CAD w/ mod stenosis in distal LM/ostial LAD, 50% RPLV-->rec Cath.   Anemia    hysterectomy   CAD (coronary artery disease)    a. 09/01/2016 Ex MV: EF 46%, hypertensive response, no ST changes, apical thinning with very mild apical ischemia;  c. 09/21/2016 Cardiac CTA: Ca2+ score = 717, 90th percentile. Extensive CAD w/ mod stenosis in distal LM/ostial LAD, 50% RPLV-->rec Cath   Carotid  arterial disease w/ left carotid bruit (HCC)    bilateral 40-59% stenosis by dopplers 2018   Diverticulosis    Hyperlipidemia    OA (osteoarthritis)    Stroke (cerebrum) (HCC)    a. 07/2016 L facial droop-->Head CT: small area of low attenuation in the right frontoparietal region c/w acute or subacute lacunar infarct; 07/2016 MRI/A brain: acute infarct of right centrum semiovale and possibly posterior limb of right internal capsule. Mild to moderate multifocal narrowing of the MCA arteries w/o occlusion.   Type II diabetes mellitus (HCC)    a. 07/2016 A1c 7.3.   White coat hypertension      Allergies as of 11/17/2023       Reactions   Ace Inhibitors Hives, Swelling, Cough, Other (See Comments)   Amlodipine  Besylate Other (See Comments)   Light headed and vertigo like  Also may have had hives.    Lovastatin  Other (See Comments)   Leg cramps,  NO statins        Medication List     TAKE these medications    acetaminophen  650 MG CR tablet Commonly known as: TYLENOL  Take 650 mg by mouth daily as needed for pain.   aspirin  EC 81 MG tablet Take 1 tablet (81 mg total) by mouth daily.   B-12 PO Take 1,000 mcg by  mouth 2 (two) times a week.   Calcium  600/Vitamin D3 600-20 MG-MCG Tabs Generic drug: Calcium  Carb-Cholecalciferol Take 1 tablet by mouth daily.   carvedilol  12.5 MG tablet Commonly known as: COREG  Take one tablets by mouth twice daily with meals.or as directed   clopidogrel  75 MG tablet Commonly known as: PLAVIX  Take 1 tablet (75 mg total) by mouth daily.   ferrous sulfate 325 (65 FE) MG tablet Take 325 mg by mouth 2 (two) times a week.   gabapentin  100 MG capsule Commonly known as: NEURONTIN  Take 1 capsule (100 mg total) by mouth at bedtime.   metFORMIN  500 MG tablet Commonly known as: GLUCOPHAGE  TAKE ONE TABLET BY MOUTH IN THE MORNING AND TAKE TWO TABLETS IN THE EVENING   nitroGLYCERIN  0.4 MG SL tablet Commonly known as: NITROSTAT  Place 1 tablet (0.4 mg total) under the tongue every 5 (five) minutes x 3 doses as needed for chest pain.   ONE-A-DAY WOMENS 50+ PO Take 1 tablet by mouth daily.   rosuvastatin  5 MG tablet Commonly known as: CRESTOR  Take 1 tablet by mouth daily if tolerated. Please keep September appointment.   Vitamin D3 10 MCG (400 UNIT) tablet Take 400 Units by mouth daily.   Voltaren 1 % Gel Generic drug: diclofenac Sodium Apply 2 g topically at bedtime as needed (Pain).          Instructions:  [facility]  Discharge Instructions  Lower Extremity Angiogram; Angioplasty/Stenting  Please refer to the following instructions for your post-procedure care. Your surgeon or physician assistant will discuss any changes with you.  Activity  Avoid lifting more than 8 pounds (1 gallons of milk) for 72 hours (3 days) after your procedure. You may walk as much as you can tolerate. It's OK to drive after 72 hours.  Bathing/Showering  You may shower the day after your procedure. If you have a bandage, you may remove it at 24- 48 hours. Clean your incision site with mild soap and water. Pat the area dry with a clean  towel.  Diet  Resume your pre-procedure diet. There are no special food restrictions following this procedure. All  patients with peripheral vascular disease should follow a low fat/low cholesterol diet. In order to heal from your surgery, it is CRITICAL to get adequate nutrition. Your body requires vitamins, minerals, and protein. Vegetables are the best source of vitamins and minerals. Vegetables also provide the perfect balance of protein. Processed food has little nutritional value, so try to avoid this.  Medications  Resume taking all of your medications unless your doctor tells you not to. If your incision is causing pain, you may take over-the-counter pain relievers such as acetaminophen  (Tylenol )  Restart Metformin  on 11/18/2023  Follow Up  Follow up will be arranged at the time of your procedure. You may have an office visit scheduled or may be scheduled for surgery. Ask your surgeon if you have any questions.  Please call us  immediately for any of the following conditions: Severe or worsening pain your legs or feet at rest or with walking. Increased pain, redness, drainage at your groin puncture site. Fever of 101 degrees or higher. If you have any mild or slow bleeding from your puncture site: lie down, apply firm constant pressure over the area with a piece of gauze or a clean wash cloth for 30 minutes- no peeking!, call 911 right away if you are still bleeding after 30 minutes, or if the bleeding is heavy and unmanageable.  Reduce your risk factors of vascular disease:  Stop smoking. If you would like help call [facility] at 1-800-QUIT-NOW (3182073729) or [place] at [phone]. Manage your cholesterol Maintain a desired weight Control your diabetes Keep your blood pressure down  If you have any questions, please call the office at [phone]  Prescriptions given:  none  Disposition: home  Patient's condition: is Good  Follow up: 1. VVS in 6 weeks with  RLE arterial duplex and ABI and see Dr. Gretta Lucie Apt, PA-C [facility] [phone] 11/20/2023  3:47 PM

## 2023-11-17 NOTE — Progress Notes (Signed)
 Notified by CCMD that patient had a burst of SVT with HR up to 140. Patient asymptomatic.

## 2023-11-17 NOTE — Progress Notes (Addendum)
 Progress Note    11/17/2023 6:23 AM 1 Day Post-Op  Subjective:  says she feels better.  No longer nauseated.  Foot feels better.   Afebrile HR 70's-80's NSR 120's-140's systolic 99% RA  Vitals:   11/16/23 2159 11/17/23 0313  BP: (!) 143/60 (!) 139/59  Pulse:  81  Resp: 16 13  Temp: 98 F (36.7 C)   SpO2: 99% 97%    Physical Exam: General:  no distress Cardiac:  regular Lungs:  non labored Incisions:  left groin with mild ecchymosis but soft without hematoma Extremities:  brisk doppler flow right AT/PT/peroneal and left peroneal    CBC    Component Value Date/Time   WBC 8.3 11/17/2023 0314   RBC 2.84 (L) 11/17/2023 0314   HGB 8.9 (L) 11/17/2023 0314   HCT 27.0 (L) 11/17/2023 0314   PLT 177 11/17/2023 0314   MCV 95.1 11/17/2023 0314   MCH 31.3 11/17/2023 0314   MCHC 33.0 11/17/2023 0314   RDW 12.5 11/17/2023 0314   LYMPHSABS 0.8 09/13/2023 0757   MONOABS 0.4 09/13/2023 0757   EOSABS 0.5 09/13/2023 0757   BASOSABS 0.0 09/13/2023 0757    BMET    Component Value Date/Time   NA 141 11/17/2023 0314   K 4.1 11/17/2023 0314   CL 108 11/17/2023 0314   CO2 25 11/17/2023 0314   GLUCOSE 131 (H) 11/17/2023 0314   BUN 17 11/17/2023 0314   CREATININE 0.96 11/17/2023 0314   CREATININE 1.00 (H) 07/03/2020 0804   CALCIUM  8.5 (L) 11/17/2023 0314   GFRNONAA 59 (L) 11/17/2023 0314   GFRAA >60 11/25/2019 1015    INR    Component Value Date/Time   INR 1.0 09/22/2016 1537     Intake/Output Summary (Last 24 hours) at 11/17/2023 9376 Last data filed at 11/17/2023 0012 Gross per 24 hour  Intake 0 ml  Output --  Net 0 ml      Assessment/Plan:  83 y.o. female is s/p:  Angiogram with right AK popliteal and SFA angioplasty with stenting, right TP trunk peroneal angioplasty via left CFA for CLI with rest pain  1 Day Post-Op   -pt doing well this am with brisk doppler flow right foot.   -feels better-will start to mobilize this morning.  Hopefully home later  today -acute blood loss anemia with hgb 8.9 down from 11.6.  BP is tolerating.   -continue asa/plavix .  No statin due to intolerance.   Lucie Apt, PA-C [facility] [phone] 11/17/2023 6:23 AM  I have seen and evaluated the patient. I agree with the PA note as documented above.  83 year old female underwent right SFA above-knee popliteal stenting for CTO as well as right TP trunk peroneal angioplasty for CLI with rest pain.  All of her rest pain has resolved.  She has brisk Doppler signal in the right foot.  Admitted overnight for observation after she became nauseated and vomited and had an episode of hypotension when she got up to ambulate yesterday post procedure.  I did hold pressure for another 20 minutes in recovery after mynx closure.  Her groin looks fine today.  Hemoglobin dropped several units suspect she bleed around the sheath or after sheath removal.  Feels much better today.  Out of bed sitting in a chair now.  Will ambulate and hopefully discharge later.  Discussed aspirin  statin Plavix  at discharge and will arrange follow-up in 1 month with right leg arterial duplex and ABIs.  Lonni DOROTHA Gaskins, MD [facility] [place] Office: [phone]

## 2023-11-17 NOTE — Progress Notes (Signed)
 Mobility Specialist Progress Note:   11/17/23 1015  Mobility  Activity Ambulated with assistance in hallway  Level of Assistance  (MinG)  Assistive Device None  Distance Ambulated (ft) 225 ft  Activity Response Tolerated well  Mobility Referral Yes  Mobility visit 1 Mobility  Mobility Specialist Start Time (ACUTE ONLY) 1000  Mobility Specialist Stop Time (ACUTE ONLY) 1013  Mobility Specialist Time Calculation (min) (ACUTE ONLY) 13 min   Pre Mobility: 101 HR  During Mobility: 123 HR  Post Mobility: 106 HR   Pt received in chair, agreeable to mobility. SB to stand. MinG during ambulation. Pt c/o R knee discomfort, otherwise asx throughout. HR peaked at 123 bpm. Pt returned to chair with call bell in reach and all needs met.   Brown Husband  Mobility Specialist Please contact via [employer] office at [phone]

## 2023-11-17 NOTE — Progress Notes (Signed)
 Discharge instructions given to the patient, PIV removed, CCMD notified, patient's belongings given, all the questions answered.

## 2023-11-17 NOTE — Plan of Care (Signed)
  Problem: Activity: Goal: Ability to return to baseline activity level will improve Outcome: Progressing   Problem: Health Behavior/Discharge Planning: Goal: Ability to safely manage health-related needs after discharge will improve Outcome: Progressing   

## 2023-11-20 ENCOUNTER — Telehealth: Payer: Self-pay

## 2023-11-20 NOTE — Telephone Encounter (Signed)
 Pt called stating that she had a procedure on her R leg last week. She wasn't having any issues except her foot felt cold.  Reviewed pt's chart, returned call for clarification, two identifiers used. Pt stated that she has no pain, no discoloration, and her foot isn't cold to the touch, just a feeling of being cold. Informed her that she is likely having some nerve sensations that should subside over time. Instructed her to call back if she has any concerning symptoms. Confirmed understanding.

## 2023-11-22 ENCOUNTER — Telehealth: Payer: Self-pay

## 2023-11-22 NOTE — Telephone Encounter (Signed)
 Pt called stating she had a surgery on 11/16/23 with Dr. Chestine Spore and she is pain free now but her foot is swollen.  Pt explains that is not swollen all the time, only when she has been up on it all day, that is warm to the touch but maybe it feels cold on the inside. She states she has been elevating it at night.   Advised patient to try to elevate her leg to ensure it is above the level of her heart and exercise throughout the day, and if she had any more concerns, she could call us.

## 2023-11-23 NOTE — Telephone Encounter (Signed)
Pt called stating she had a surgery on 11/16/23 with Dr. Chestine Spore and she is pain free now but her foot is swollen.  Pt explains that is not swollen all the time, only when she has been up on it all day, that is warm to the touch but maybe it feels cold on the inside. She states she has been elevating it at night.   Advised patient to try to elevate her leg to ensure it is above the level of her heart and exercise throughout the day, and if she had any more concerns, she could call us.

## 2023-12-06 ENCOUNTER — Ambulatory Visit: Payer: Medicare Other | Admitting: Vascular Surgery

## 2023-12-12 ENCOUNTER — Other Ambulatory Visit: Payer: Self-pay | Admitting: *Deleted

## 2023-12-12 DIAGNOSIS — I70221 Atherosclerosis of native arteries of extremities with rest pain, right leg: Secondary | ICD-10-CM

## 2023-12-21 ENCOUNTER — Other Ambulatory Visit: Payer: Self-pay | Admitting: Internal Medicine

## 2023-12-26 ENCOUNTER — Ambulatory Visit (HOSPITAL_COMMUNITY)
Admission: RE | Admit: 2023-12-26 | Discharge: 2023-12-26 | Disposition: A | Discharge status: Home / Self Care | Payer: Medicare Other | Source: Ambulatory Visit | Attending: Vascular Surgery | Admitting: Vascular Surgery

## 2023-12-26 ENCOUNTER — Ambulatory Visit (INDEPENDENT_AMBULATORY_CARE_PROVIDER_SITE_OTHER): Payer: Medicare Other | Admitting: Physician Assistant

## 2023-12-26 ENCOUNTER — Encounter: Payer: Self-pay | Admitting: Physician Assistant

## 2023-12-26 ENCOUNTER — Ambulatory Visit (INDEPENDENT_AMBULATORY_CARE_PROVIDER_SITE_OTHER)
Admission: RE | Admit: 2023-12-26 | Discharge: 2023-12-26 | Disposition: A | Discharge status: Home / Self Care | Payer: Medicare Other | Source: Ambulatory Visit | Attending: Vascular Surgery | Admitting: Vascular Surgery

## 2023-12-26 VITALS — BP 192/82 | HR 66 | Temp 98.2°F | Resp 18 | Ht 61.5 in | Wt 124.6 lb

## 2023-12-26 DIAGNOSIS — I70221 Atherosclerosis of native arteries of extremities with rest pain, right leg: Secondary | ICD-10-CM | POA: Insufficient documentation

## 2023-12-26 LAB — VAS US ABI WITH/WO TBI
Left ABI: 0.74
Right ABI: 0.89

## 2023-12-26 MED ORDER — ROSUVASTATIN CALCIUM 5 MG PO TABS: ORAL_TABLET | ORAL | 1 refills | Status: DC

## 2023-12-26 NOTE — Progress Notes (Signed)
HISTORY AND PHYSICAL     CC:  follow up. Requesting Provider:  Madelin Headings, MD  HPI: This is a 83 y.o. female who is here today for follow up for PAD.  Pt has hx of angiogram with right above knee popliteal artery and SFA angioplasty with stent placement x 2, right TPT and peroneal artery angioplasty 11/16/2023 for CLI with rest pain by Dr. Chestine Spore.  The pt returns today for follow up.  She states that her pain in the right leg and foot has completely resolved.  She was having some swelling after her procedure that has improved.  This happens after she has been on her feet for a long period of time.  She requests a refill for her crestor.   She is compliant with here asa/plavix.  She denies any unilateral weakness or numbness and denies any visual changes.  She is scheduled for carotid duplex next month.    She is hypertensive today.  She states that she has appt with new cardiologist Dr. Cristal Deer at [facility] location in the coming weeks.   The pt is on a statin for cholesterol management.    The pt is on an aspirin.    Other AC:  Plavix The pt is on BB for hypertension.  The pt is  on medication for diabetes. Tobacco hx:  never  Pt does not have family hx of AAA.  Past Medical History:  Diagnosis Date   Abnormal echocardiogram    a.07/2016 Echo: EF 50-55%, basal and mid inferolateral AK.   Agatston coronary artery calcium score greater than 400    a. 07/2016 Abnl Echo with basal and mid inferolateral AK, EF 50-55%; b. 09/01/2016 Ex MV: EF 46%, hypertensive response, no ST changes, apical thinning with very mild apical ischemia;  c. 09/21/2016 Cardiac CTA: Ca2+ score = 717, 90th percentile. Extensive CAD w/ mod stenosis in distal LM/ostial LAD, 50% RPLV-->rec Cath.   Anemia    hysterectomy   CAD (coronary artery disease)    a. 09/01/2016 Ex MV: EF 46%, hypertensive response, no ST changes, apical thinning with very mild apical ischemia;  c. 09/21/2016 Cardiac CTA: Ca2+ score = 717,  90th percentile. Extensive CAD w/ mod stenosis in distal LM/ostial LAD, 50% RPLV-->rec Cath   Carotid arterial disease w/ left carotid bruit (HCC)    bilateral 40-59% stenosis by dopplers 2018   Diverticulosis    Hyperlipidemia    OA (osteoarthritis)    Stroke (cerebrum) (HCC)    a. 07/2016 L facial droop-->Head CT: small area of low attenuation in the right frontoparietal region c/w acute or subacute lacunar infarct; 07/2016 MRI/A brain: acute infarct of right centrum semiovale and possibly posterior limb of right internal capsule. Mild to moderate multifocal narrowing of the MCA arteries w/o occlusion.   Type II diabetes mellitus (HCC)    a. 07/2016 A1c 7.3.   White coat hypertension     Past Surgical History:  Procedure Laterality Date   ABDOMINAL AORTOGRAM W/LOWER EXTREMITY N/A 11/16/2023   Procedure: ABDOMINAL AORTOGRAM W/LOWER EXTREMITY;  Surgeon: Cephus Shelling, MD;  Location: [facility] INVASIVE CV LAB;  Service: Cardiovascular;  Laterality: N/A;   ABDOMINAL HYSTERECTOMY     fibroid bleeding   CARDIAC CATHETERIZATION N/A 09/23/2016   Procedure: Left Heart Cath and Coronary Angiography;  Surgeon: Yvonne Kendall, MD;  Location: [facility] INVASIVE CV LAB;  Service: Cardiovascular;  Laterality: N/A;   CARDIAC CATHETERIZATION N/A 09/23/2016   Procedure: Intravascular Pressure Wire/FFR Study;  Surgeon: Yvonne Kendall,  MD;  Location: [facility] INVASIVE CV LAB;  Service: Cardiovascular;  Laterality: N/A;   carotid dopplers  11/11   per [facility] clinic   CATARACT EXTRACTION  2011   EAR CYST EXCISION Left 11/25/2019   Procedure: EXCISION LEFT EAR CANAL MASS;  Surgeon: Serena Colonel, MD;  Location: [facility];  Service: ENT;  Laterality: Left;   PERIPHERAL VASCULAR BALLOON ANGIOPLASTY Right 11/16/2023   Procedure: PERIPHERAL VASCULAR BALLOON ANGIOPLASTY;  Surgeon: Cephus Shelling, MD;  Location: [facility] INVASIVE CV LAB;  Service: Cardiovascular;  Laterality: Right;  TP trunk to peroneal   PERIPHERAL  VASCULAR INTERVENTION Right 11/16/2023   Procedure: PERIPHERAL VASCULAR INTERVENTION;  Surgeon: Cephus Shelling, MD;  Location: [facility] INVASIVE CV LAB;  Service: Cardiovascular;  Laterality: Right;  SFA to Pop    Allergies  Allergen Reactions   Ace Inhibitors Hives, Swelling, Cough and Other (See Comments)   Amlodipine Besylate Other (See Comments)    Light headed and vertigo like  Also may have had hives.     Lovastatin Other (See Comments)    Leg cramps,  NO statins    Current Outpatient Medications  Medication Sig Dispense Refill   acetaminophen (TYLENOL) 650 MG CR tablet Take 650 mg by mouth daily as needed for pain.     aspirin EC 81 MG tablet Take 1 tablet (81 mg total) by mouth daily.     Calcium Carb-Cholecalciferol (CALCIUM 600/VITAMIN D3) 600-20 MG-MCG TABS Take 1 tablet by mouth daily.     carvedilol (COREG) 12.5 MG tablet Take 1 tablet by mouth twice daily with meals or as directed 180 tablet 2   Cholecalciferol (VITAMIN D3) 10 MCG (400 UNIT) tablet Take 400 Units by mouth daily.     clopidogrel (PLAVIX) 75 MG tablet Take 1 tablet (75 mg total) by mouth daily. 90 tablet 4   Cyanocobalamin (B-12 PO) Take 1,000 mcg by mouth 2 (two) times a week.     diclofenac Sodium (VOLTAREN) 1 % GEL Apply 2 g topically at bedtime as needed (Pain).     ferrous sulfate 325 (65 FE) MG tablet Take 325 mg by mouth 2 (two) times a week.     gabapentin (NEURONTIN) 100 MG capsule Take 1 capsule (100 mg total) by mouth at bedtime. 30 capsule 0   metFORMIN (GLUCOPHAGE) 500 MG tablet Take 1 tablet by mouth every morning and 2 tablets by mouth every evening 270 tablet 2   Multiple Vitamins-Minerals (ONE-A-DAY WOMENS 50+ PO) Take 1 tablet by mouth daily.     nitroGLYCERIN (NITROSTAT) 0.4 MG SL tablet Place 1 tablet (0.4 mg total) under the tongue every 5 (five) minutes x 3 doses as needed for chest pain. 25 tablet 12   rosuvastatin (CRESTOR) 5 MG tablet Take 1 tablet by mouth daily if tolerated. Please  keep September appointment. 15 tablet 0   No current facility-administered medications for this visit.    Family History  Problem Relation Age of Onset   Cancer Mother        breast   Breast cancer Mother    CAD Neg Hx     Social History   Socioeconomic History   Marital status: Married    Spouse name: Not on file   Number of children: Not on file   Years of education: Not on file   Highest education level: Not on file  Occupational History   Not on file  Tobacco Use   Smoking status: Never   Smokeless tobacco: Never  Vaping Use   Vaping status: Never Used  Substance and Sexual Activity   Alcohol use: No    Alcohol/week: 0.0 standard drinks of alcohol   Drug use: No   Sexual activity: Not on file  Other Topics Concern   Not on file  Social History Narrative   Lives in [place] with husband.   Retired.   hh of 2   No pets   Social Drivers of Corporate investment banker Strain: Low Risk  (09/20/2023)   Overall Financial Resource Strain (CARDIA)    Difficulty of Paying Living Expenses: Not hard at all  Food Insecurity: Patient Declined (11/17/2023)   Hunger Vital Sign    Worried About Running Out of Food in the Last Year: Patient declined    Ran Out of Food in the Last Year: Patient declined  Transportation Needs: Patient Declined (11/17/2023)   PRAPARE - Administrator, Civil Service (Medical): Patient declined    Lack of Transportation (Non-Medical): Patient declined  Physical Activity: Inactive (09/20/2023)   Exercise Vital Sign    Days of Exercise per Week: 0 days    Minutes of Exercise per Session: 0 min  Stress: No Stress Concern Present (09/20/2023)   [employer] of Occupational Health - Occupational Stress Questionnaire    Feeling of Stress : Not at all  Social Connections: Patient Declined (11/17/2023)   Social Connection and Isolation Panel [NHANES]    Frequency of Communication with Friends and Family: Patient declined    Frequency of  Social Gatherings with Friends and Family: Patient declined    Attends Religious Services: Patient declined    Database administrator or Organizations: Patient declined    Attends Banker Meetings: Patient declined    Marital Status: Patient declined  Intimate Partner Violence: Patient Declined (11/17/2023)   Humiliation, Afraid, Rape, and Kick questionnaire    Fear of Current or Ex-Partner: Patient declined    Emotionally Abused: Patient declined    Physically Abused: Patient declined    Sexually Abused: Patient declined     REVIEW OF SYSTEMS:   [X]  denotes positive finding, [ ]  denotes negative finding Cardiac  Comments:  Chest pain or chest pressure:    Shortness of breath upon exertion:    Short of breath when lying flat:    Irregular heart rhythm:        Vascular    Pain in calf, thigh, or hip brought on by ambulation:    Pain in feet at night that wakes you up from your sleep:     Blood clot in your veins:    Leg swelling:         Pulmonary    Oxygen at home:    Productive cough:     Wheezing:         Neurologic    Sudden weakness in arms or legs:     Sudden numbness in arms or legs:     Sudden onset of difficulty speaking or slurred speech:    Temporary loss of vision in one eye:     Problems with dizziness:         Gastrointestinal    Blood in stool:     Vomited blood:         Genitourinary    Burning when urinating:     Blood in urine:        Psychiatric    Major depression:         Hematologic  Bleeding problems:    Problems with blood clotting too easily:        Skin    Rashes or ulcers:        Constitutional    Fever or chills:      PHYSICAL EXAMINATION:  Today's Vitals   12/26/23 1046  BP: (!) 192/82  Pulse: 66  Resp: 18  Temp: 98.2 F (36.8 C)  TempSrc: Temporal  SpO2: 98%  Weight: 124 lb 9.6 oz (56.5 kg)  Height: 5' 1.5" (1.562 m)  PainSc: 0-No pain   Body mass index is 23.16 kg/m.   General:  WDWN in NAD;  vital signs documented above Gait: Not observed HENT: WNL, normocephalic Pulmonary: normal non-labored breathing , without wheezing Cardiac: regular HR, with carotid bruit on the left Abdomen: soft, NT; aortic pulse is not palpable Skin: without rashes Vascular Exam/Pulses:  Right Left  Radial 2+ (normal) 2+ (normal)  DP 2+ (normal) 2+ (normal)   Extremities: without ischemic changes, without Gangrene , without cellulitis; without open wounds; mild ankle edema Musculoskeletal: no muscle wasting or atrophy  Neurologic: A&O X 3 Psychiatric:  The pt has Normal affect.   Non-Invasive Vascular Imaging:   ABI's/TBI's on 12/26/2023: Right:  0.89/0.52 - Great toe pressure: 97 Left:  0.74/0.55 - Great toe pressure: 102  Arterial duplex on 12/26/2023: +-----------+--------+-----+--------+--------+--------+  RIGHT     PSV cm/sRatioStenosisWaveformComments  +-----------+--------+-----+--------+--------+--------+  CFA Distal 90                   biphasic          +-----------+--------+-----+--------+--------+--------+  DFA       87                   biphasic          +-----------+--------+-----+--------+--------+--------+  SFA Prox   81                   biphasic          +-----------+--------+-----+--------+--------+--------+  SFA Mid    63                   biphasic          +-----------+--------+-----+--------+--------+--------+  TP Trunk   78                   biphasic          +-----------+--------+-----+--------+--------+--------+  ATA Distal 151                  biphasic          +-----------+--------+-----+--------+--------+--------+  PTA Distal 83                   biphasic          +-----------+--------+-----+--------+--------+--------+  PERO Prox  26                   biphasic          +-----------+--------+-----+--------+--------+--------+  PERO Distal             occluded                   +-----------+--------+-----+--------+--------+--------+   Right Stent(s):  +---------------+--------+--------+--------+--------+  SFA           PSV cm/sStenosisWaveformComments  +---------------+--------+--------+--------+--------+  Prox to Stent  63              biphasic          +---------------+--------+--------+--------+--------+  Proximal Stent 91              biphasic          +---------------+--------+--------+--------+--------+  Mid Stent      48              biphasic          +---------------+--------+--------+--------+--------+  Distal Stent   50              biphasic          +---------------+--------+--------+--------+--------+  Distal to Stent55              biphasic          +---------------+--------+--------+--------+--------+   Summary:  Right: Total occlusion noted in the distal peroneal artery. Patent stent with no evidence of stenosis in the superficial femoral artery and popliteal artery artery.   Previous ABI's/TBI's on 10/18/2023: Right:  0.49/0.25 - Great toe pressure: 52 Left:  0.85/0.56 - Great toe pressure:  116     ASSESSMENT/PLAN:: 83 y.o. female here for follow up for PAD with hx of angiogram with right above knee popliteal artery and SFA angioplasty with stent placement x 2, right TPT and peroneal artery angioplasty 11/16/2023 for CLI with rest pain by Dr. Chestine Spore.   -pt doing well with rest pain completely resolved.  She does not have claudication or non healing wounds.  ABI significantly improved.  Her peroneal artery is occluded but given she is asymptomatic, would not recommend intervention.   -continue asa/statin/plavix -pt will f/u in 6 months with RLE arterial duplex and ABI.  She does have carotid duplex scheduled for next month.  Hopeful that in follow up, we can get these studies all done on the same day.   -Rx for Crestor 5mg  daily #30 with one refill sent to her pharmacy.  We discussed that her PCP can  continue with refills thereafter and that way, she can continue to monitor her labs in the future. She was in agreement with this plan.    Doreatha Massed, [facility] [phone]  Clinic MD:   Chestine Spore

## 2024-01-02 ENCOUNTER — Other Ambulatory Visit: Payer: Self-pay

## 2024-01-02 DIAGNOSIS — I70221 Atherosclerosis of native arteries of extremities with rest pain, right leg: Secondary | ICD-10-CM

## 2024-01-02 DIAGNOSIS — I739 Peripheral vascular disease, unspecified: Secondary | ICD-10-CM

## 2024-01-02 DIAGNOSIS — I70213 Atherosclerosis of native arteries of extremities with intermittent claudication, bilateral legs: Secondary | ICD-10-CM

## 2024-01-15 ENCOUNTER — Ambulatory Visit (INDEPENDENT_AMBULATORY_CARE_PROVIDER_SITE_OTHER): Payer: Medicare Other

## 2024-01-15 VITALS — Ht 61.5 in | Wt 124.0 lb

## 2024-01-15 DIAGNOSIS — Z0000 Encounter for general adult medical examination without abnormal findings: Secondary | ICD-10-CM

## 2024-01-15 NOTE — Progress Notes (Signed)
 Subjective:   Sharon Wilson is a 83 y.o. female who presents for Medicare Annual (Subsequent) preventive examination.  Visit Complete: Virtual I connected with  Sharon Wilson on 01/15/24 by a audio enabled telemedicine application and verified that I am speaking with the correct person using two identifiers.  Patient Location: Home  Provider Location: Home Office  I discussed the limitations of evaluation and management by telemedicine. The patient expressed understanding and agreed to proceed.  Vital Signs: Because this visit was a virtual/telehealth visit, some criteria may be missing or patient reported. Any vitals not documented were not able to be obtained and vitals that have been documented are patient reported.  Patient Medicare AWV questionnaire was completed by the patient on 01/14/24; I have confirmed that all information answered by patient is correct and no changes since this date.  Cardiac Risk Factors include: advanced age (>71men, >11 women);diabetes mellitus;hypertension     Objective:    Today's Vitals   01/15/24 1551 01/15/24 1552  Weight: 124 lb (56.2 kg)   Height: 5' 1.5" (1.562 m)   PainSc:  0-No pain   Body mass index is 23.05 kg/m.     01/15/2024    4:00 PM 11/16/2023    9:22 AM 01/04/2023    1:21 PM 12/16/2021    1:07 PM 11/14/2018   11:50 AM 10/01/2016    9:45 AM 09/24/2016    2:33 PM  Advanced Directives  Does Patient Have a Medical Advance Directive? No No No No No No No  Does patient want to make changes to medical advance directive?      No - Patient declined   Would patient like information on creating a medical advance directive? No - Patient declined No - Patient declined No - Patient declined No - Patient declined No - Patient declined      Current Medications (verified) Outpatient Encounter Medications as of 01/15/2024  Medication Sig   acetaminophen (TYLENOL) 650 MG CR tablet Take 650 mg by mouth daily as needed for pain.    aspirin EC 81 MG tablet Take 1 tablet (81 mg total) by mouth daily.   Calcium Carb-Cholecalciferol (CALCIUM 600/VITAMIN D3) 600-20 MG-MCG TABS Take 1 tablet by mouth daily.   carvedilol (COREG) 12.5 MG tablet Take 1 tablet by mouth twice daily with meals or as directed   Cholecalciferol (VITAMIN D3) 10 MCG (400 UNIT) tablet Take 400 Units by mouth daily.   clopidogrel (PLAVIX) 75 MG tablet Take 1 tablet (75 mg total) by mouth daily.   Cyanocobalamin (B-12 PO) Take 1,000 mcg by mouth 2 (two) times a week.   diclofenac Sodium (VOLTAREN) 1 % GEL Apply 2 g topically at bedtime as needed (Pain).   ferrous sulfate 325 (65 FE) MG tablet Take 325 mg by mouth 2 (two) times a week.   gabapentin (NEURONTIN) 100 MG capsule Take 1 capsule (100 mg total) by mouth at bedtime.   metFORMIN (GLUCOPHAGE) 500 MG tablet Take 1 tablet by mouth every morning and 2 tablets by mouth every evening   Multiple Vitamins-Minerals (ONE-A-DAY WOMENS 50+ PO) Take 1 tablet by mouth daily.   nitroGLYCERIN (NITROSTAT) 0.4 MG SL tablet Place 1 tablet (0.4 mg total) under the tongue every 5 (five) minutes x 3 doses as needed for chest pain.   rosuvastatin (CRESTOR) 5 MG tablet Take 1 tablet by mouth daily if tolerated. Please keep September appointment.   No facility-administered encounter medications on file as of 01/15/2024.    Allergies (  verified) Ace inhibitors, Amlodipine besylate, and Lovastatin   History: Past Medical History:  Diagnosis Date   Abnormal echocardiogram    a.07/2016 Echo: EF 50-55%, basal and mid inferolateral AK.   Agatston coronary artery calcium score greater than 400    a. 07/2016 Abnl Echo with basal and mid inferolateral AK, EF 50-55%; b. 09/01/2016 Ex MV: EF 46%, hypertensive response, no ST changes, apical thinning with very mild apical ischemia;  c. 09/21/2016 Cardiac CTA: Ca2+ score = 717, 90th percentile. Extensive CAD w/ mod stenosis in distal LM/ostial LAD, 50% RPLV-->rec Cath.   Anemia     hysterectomy   CAD (coronary artery disease)    a. 09/01/2016 Ex MV: EF 46%, hypertensive response, no ST changes, apical thinning with very mild apical ischemia;  c. 09/21/2016 Cardiac CTA: Ca2+ score = 717, 90th percentile. Extensive CAD w/ mod stenosis in distal LM/ostial LAD, 50% RPLV-->rec Cath   Carotid arterial disease w/ left carotid bruit (HCC)    bilateral 40-59% stenosis by dopplers 2018   Diverticulosis    Hyperlipidemia    OA (osteoarthritis)    Stroke (cerebrum) (HCC)    a. 07/2016 L facial droop-->Head CT: small area of low attenuation in the right frontoparietal region c/w acute or subacute lacunar infarct; 07/2016 MRI/A brain: acute infarct of right centrum semiovale and possibly posterior limb of right internal capsule. Mild to moderate multifocal narrowing of the MCA arteries w/o occlusion.   Type II diabetes mellitus (HCC)    a. 07/2016 A1c 7.3.   White coat hypertension    Past Surgical History:  Procedure Laterality Date   ABDOMINAL AORTOGRAM W/LOWER EXTREMITY N/A 11/16/2023   Procedure: ABDOMINAL AORTOGRAM W/LOWER EXTREMITY;  Surgeon: Cephus Shelling, MD;  Location: [facility] INVASIVE CV LAB;  Service: Cardiovascular;  Laterality: N/A;   ABDOMINAL HYSTERECTOMY     fibroid bleeding   CARDIAC CATHETERIZATION N/A 09/23/2016   Procedure: Left Heart Cath and Coronary Angiography;  Surgeon: Yvonne Kendall, MD;  Location: [facility] INVASIVE CV LAB;  Service: Cardiovascular;  Laterality: N/A;   CARDIAC CATHETERIZATION N/A 09/23/2016   Procedure: Intravascular Pressure Wire/FFR Study;  Surgeon: Yvonne Kendall, MD;  Location: [facility] INVASIVE CV LAB;  Service: Cardiovascular;  Laterality: N/A;   carotid dopplers  11/11   per [facility] clinic   CATARACT EXTRACTION  2011   EAR CYST EXCISION Left 11/25/2019   Procedure: EXCISION LEFT EAR CANAL MASS;  Surgeon: Serena Colonel, MD;  Location: [facility];  Service: ENT;  Laterality: Left;   PERIPHERAL VASCULAR BALLOON ANGIOPLASTY Right  11/16/2023   Procedure: PERIPHERAL VASCULAR BALLOON ANGIOPLASTY;  Surgeon: Cephus Shelling, MD;  Location: [facility] INVASIVE CV LAB;  Service: Cardiovascular;  Laterality: Right;  TP trunk to peroneal   PERIPHERAL VASCULAR INTERVENTION Right 11/16/2023   Procedure: PERIPHERAL VASCULAR INTERVENTION;  Surgeon: Cephus Shelling, MD;  Location: [facility] INVASIVE CV LAB;  Service: Cardiovascular;  Laterality: Right;  SFA to Pop   Family History  Problem Relation Age of Onset   Cancer Mother        breast   Breast cancer Mother    CAD Neg Hx    Social History   Socioeconomic History   Marital status: Married    Spouse name: Not on file   Number of children: Not on file   Years of education: Not on file   Highest education level: 12th grade  Occupational History   Not on file  Tobacco Use   Smoking status: Never   Smokeless  tobacco: Never  Vaping Use   Vaping status: Never Used  Substance and Sexual Activity   Alcohol use: No    Alcohol/week: 0.0 standard drinks of alcohol   Drug use: No   Sexual activity: Not on file  Other Topics Concern   Not on file  Social History Narrative   Lives in [place] with husband.   Retired.   hh of 2   No pets   Social Drivers of Corporate investment banker Strain: Low Risk  (01/15/2024)   Overall Financial Resource Strain (CARDIA)    Difficulty of Paying Living Expenses: Not hard at all  Food Insecurity: No Food Insecurity (01/15/2024)   Hunger Vital Sign    Worried About Running Out of Food in the Last Year: Never true    Ran Out of Food in the Last Year: Never true  Transportation Needs: No Transportation Needs (01/15/2024)   PRAPARE - Administrator, Civil Service (Medical): No    Lack of Transportation (Non-Medical): No  Physical Activity: Inactive (01/15/2024)   Exercise Vital Sign    Days of Exercise per Week: 0 days    Minutes of Exercise per Session: 0 min  Stress: No Stress Concern Present (01/15/2024)   [employer] of  Occupational Health - Occupational Stress Questionnaire    Feeling of Stress : Not at all  Social Connections: Socially Integrated (01/15/2024)   Social Connection and Isolation Panel [NHANES]    Frequency of Communication with Friends and Family: More than three times a week    Frequency of Social Gatherings with Friends and Family: More than three times a week    Attends Religious Services: More than 4 times per year    Active Member of [employer] or Organizations: Yes    Attends Engineer, structural: More than 4 times per year    Marital Status: Married    Tobacco Counseling Counseling given: Not Answered   Clinical Intake:  Pre-visit preparation completed: Yes  Pain : No/denies pain Pain Score: 0-No pain     BMI - recorded: 23.05 Nutritional Status: BMI of 19-24  Normal Nutritional Risks: None Diabetes: Yes CBG Wilson?: Yes (CBG 130 per patient) CBG resulted in Enter/ Edit results?: Yes Did pt. bring in CBG monitor from home?: No  How often do you need to have someone help you when you read instructions, pamphlets, or other written materials from your doctor or pharmacy?: 1 - Never  Interpreter Needed?: No  Information entered by :: Theresa Mulligan LPN   Activities of Daily Living    01/15/2024    4:00 PM 01/14/2024    6:01 PM  In your present state of health, do you have any difficulty performing the following activities:  Hearing? 0 0  Vision? 0 0  Difficulty concentrating or making decisions? 0 0  Walking or climbing stairs? 0 0  Dressing or bathing? 0 0  Doing errands, shopping? 0 0  Preparing Food and eating ? N N  Using the Toilet? N N  In the past six months, have you accidently leaked urine? N N  Do you have problems with loss of bowel control? N N  Managing your Medications? N N  Managing your Finances? N N  Housekeeping or managing your Housekeeping? N N    Patient Care Team: Panosh, Neta Mends, MD as PCP - General Quintella Reichert, MD as PCP -  Cardiology (Cardiology) Janet Berlin, MD as Attending Physician (Ophthalmology) Marcelene Butte, MD as  Referring Physician (Ophthalmology) End, Cristal Deer, MD as Consulting Physician (Cardiology) Quintella Reichert, MD as Consulting Physician (Cardiology) Serena Colonel, MD as Consulting Physician (Otolaryngology)  Indicate any recent Medical Services you may have received from other than [facility] providers in the past year (date may be approximate).     Assessment:   This is a routine wellness examination for Shawnita.  Hearing/Vision screen Hearing Screening - Comments:: Denies hearing difficulties   Vision Screening - Comments:: Wears rx glasses - up to date with routine eye exams with  Dr Burgess Estelle   Goals Addressed               This Visit's Progress     Increase physical activity (pt-stated)        Stay Active.       Depression Screen    01/15/2024    3:59 PM 09/20/2023   10:04 AM 01/04/2023    1:17 PM 12/15/2022    9:44 AM 07/27/2022   11:03 AM 01/18/2022    9:08 AM 12/16/2021    1:03 PM  PHQ 2/9 Scores  PHQ - 2 Score 0 0 0 0 0 0 0  PHQ- 9 Score   0 0 0 0     Fall Risk    01/15/2024    4:00 PM 01/15/2024    2:50 PM 01/14/2024    6:01 PM 01/04/2023    1:20 PM 12/15/2022    9:44 AM  Fall Risk   Falls in the past year? 0 0 0 0 0  Number falls in past yr: 0  0 0 0  Injury with Fall? 0  0 0 0  Risk for fall due to : No Fall Risks   No Fall Risks No Fall Risks  Follow up Falls prevention discussed;Falls evaluation completed   Falls prevention discussed Falls evaluation completed    MEDICARE RISK AT HOME: Medicare Risk at Home Any stairs in or around the home?: Yes If so, are there any without handrails?: Yes Home free of loose throw rugs in walkways, pet beds, electrical cords, etc?: Yes Adequate lighting in your home to reduce risk of falls?: Yes Life alert?: No Use of a cane, walker or w/c?: No Grab bars in the bathroom?: No Shower chair or bench in shower?:  No Elevated toilet seat or a handicapped toilet?: No  TIMED UP AND GO:  Was the test performed?  No    Cognitive Function:        01/15/2024    4:01 PM 01/04/2023    1:21 PM 12/16/2021    1:08 PM  6CIT Screen  What Year? 0 points 0 points 0 points  What month? 0 points 0 points 0 points  What time? 0 points 0 points 0 points  Count back from 20 0 points 0 points 0 points  Months in reverse 0 points 0 points 0 points  Repeat phrase 0 points 0 points 0 points  Total Score 0 points 0 points 0 points    Immunizations Immunization History  Administered Date(s) Administered   Fluad Quad(high Dose 65+) 09/23/2021   Influenza Split 11/15/2011, 08/24/2012   Influenza Whole 08/29/2007, 08/08/2008, 08/21/2009, 07/23/2010   Influenza, High Dose Seasonal PF 09/05/2014, 10/16/2015, 08/19/2016, 09/07/2018   PFIZER(Purple Top)SARS-COV-2 Vaccination 01/09/2020, 02/05/2020   Pneumococcal Conjugate-13 12/13/2013   Pneumococcal Polysaccharide-23 05/02/2007   Td 08/21/2009   Zoster, Live 02/20/2009    TDAP status: Due, Education has been provided regarding the importance of this vaccine. Advised may receive  this vaccine at local pharmacy or Health Dept. Aware to provide a copy of the vaccination record if obtained from local pharmacy or Health Dept. Verbalized acceptance and understanding.  Flu Vaccine status: Declined, Education has been provided regarding the importance of this vaccine but patient still declined. Advised may receive this vaccine at local pharmacy or Health Dept. Aware to provide a copy of the vaccination record if obtained from local pharmacy or Health Dept. Verbalized acceptance and understanding.  Pneumococcal vaccine status: Up to date  Covid-19 vaccine status: Declined, Education has been provided regarding the importance of this vaccine but patient still declined. Advised may receive this vaccine at local pharmacy or Health Dept.or vaccine clinic. Aware to provide a copy  of the vaccination record if obtained from local pharmacy or Health Dept. Verbalized acceptance and understanding.  Qualifies for Shingles Vaccine? Yes   Zostavax completed No   Shingrix Completed?: No.    Education has been provided regarding the importance of this vaccine. Patient has been advised to call insurance company to determine out of pocket expense if they have not yet received this vaccine. Advised may also receive vaccine at local pharmacy or Health Dept. Verbalized acceptance and understanding.  Screening Tests Health Maintenance  Topic Date Due   Zoster Vaccines- Shingrix (1 of 2) 04/17/1960   COVID-19 Vaccine (3 - Pfizer risk series) 03/04/2020   FOOT EXAM  07/15/2021   Diabetic kidney evaluation - Urine ACR  07/20/2023   INFLUENZA VACCINE  02/05/2024 (Originally 06/08/2023)   DTaP/Tdap/Td (2 - Tdap) 09/19/2024 (Originally 08/22/2019)   HEMOGLOBIN A1C  03/12/2024   OPHTHALMOLOGY EXAM  07/25/2024   Diabetic kidney evaluation - eGFR measurement  11/16/2024   Medicare Annual Wellness (AWV)  01/14/2025   Pneumonia Vaccine 3+ Years old  Completed   DEXA SCAN  Completed   HPV VACCINES  Aged Out    Health Maintenance  Health Maintenance Due  Topic Date Due   Zoster Vaccines- Shingrix (1 of 2) 04/17/1960   COVID-19 Vaccine (3 - Pfizer risk series) 03/04/2020   FOOT EXAM  07/15/2021   Diabetic kidney evaluation - Urine ACR  07/20/2023        Bone Density status: Completed 08/13/14. Results reflect: Bone density results: OSTEOPOROSIS. Repeat every   years.     Additional Screening:   Vision Screening: Recommended annual ophthalmology exams for early detection of glaucoma and other disorders of the eye. Is the patient up to date with their annual eye exam?  Yes  Who is the provider or what is the name of the office in which the patient attends annual eye exams? Dr Burgess Estelle If pt is not established with a provider, would they like to be referred to a provider to  establish care? No .   Dental Screening: Recommended annual dental exams for proper oral hygiene  Diabetic Foot Exam: Diabetic Foot Exam: Overdue, Pt has been advised about the importance in completing this exam. Pt is scheduled for diabetic foot exam on Deferred.  Community Resource Referral / Chronic Care Management:  CRR required this visit?  No   CCM required this visit?  No     Plan:     I have personally reviewed and noted the following in the patient's chart:   Medical and social history Use of alcohol, tobacco or illicit drugs  Current medications and supplements including opioid prescriptions. Patient is not currently taking opioid prescriptions. Functional ability and status Nutritional status Physical activity Advanced directives List of other  physicians Hospitalizations, surgeries, and ER visits in previous 12 months Vitals Screenings to include cognitive, depression, and falls Referrals and appointments  In addition, I have reviewed and discussed with patient certain preventive protocols, quality metrics, and best practice recommendations. A written personalized care plan for preventive services as well as general preventive health recommendations were provided to patient.     Tillie Rung, LPN   [id number]   After Visit Summary: (MyChart) Due to this being a telephonic visit, the after visit summary with patients personalized plan was offered to patient via MyChart   Nurse Notes: None

## 2024-01-15 NOTE — Patient Instructions (Addendum)
 Sharon Wilson , Thank you for taking time to come for your Medicare Wellness Visit. I appreciate your ongoing commitment to your health goals. Please review the following plan we discussed and let me know if I can assist you in the future.   Referrals/Orders/Follow-Ups/Clinician Recommendations:   This is a list of the screening recommended for you and due dates:  Health Maintenance  Topic Date Due   Zoster (Shingles) Vaccine (1 of 2) [birth year]17/1960   COVID-19 Vaccine (3 - Pfizer risk series) 03/04/2020   Complete foot exam   07/15/2021   Yearly kidney health urinalysis for diabetes  07/20/2023   Flu Shot  02/05/2024*   DTaP/Tdap/Td vaccine (2 - Tdap) 09/19/2024*   Hemoglobin A1C  03/12/2024   Eye exam for diabetics  07/25/2024   Yearly kidney function blood test for diabetes  11/16/2024   Medicare Annual Wellness Visit  01/14/2025   Pneumonia Vaccine  Completed   DEXA scan (bone density measurement)  Completed   HPV Vaccine  Aged Out  *Topic was postponed. The date shown is not the original due date.    Advanced directives: (Declined) Advance directive discussed with you today. Even though you declined this today, please call our office should you change your mind, and we can give you the proper paperwork for you to fill out.  Next Medicare Annual Wellness Visit scheduled for next year: Yes

## 2024-01-16 NOTE — Progress Notes (Unsigned)
 No chief complaint on file.   HPI: Sharon Wilson 83 y.o. come in for Chronic disease management   Dm HDL  PVD pad with procedure jan 25  Dr Chestine Spore  ballon angioplasty and stent and stnt sfa   hx of angiogram with right above knee popliteal artery and SFA angioplasty with stent placement x 2, right TPT and peroneal artery angioplasty 11/16/2023 for CLI with rest pain by Dr. Chestine Spore.  BP  ROS: See pertinent positives and negatives per HPI.  Past Medical History:  Diagnosis Date   Abnormal echocardiogram    a.07/2016 Echo: EF 50-55%, basal and mid inferolateral AK.   Agatston coronary artery calcium score greater than 400    a. 07/2016 Abnl Echo with basal and mid inferolateral AK, EF 50-55%; b. 09/01/2016 Ex MV: EF 46%, hypertensive response, no ST changes, apical thinning with very mild apical ischemia;  c. 09/21/2016 Cardiac CTA: Ca2+ score = 717, 90th percentile. Extensive CAD w/ mod stenosis in distal LM/ostial LAD, 50% RPLV-->rec Cath.   Anemia    hysterectomy   CAD (coronary artery disease)    a. 09/01/2016 Ex MV: EF 46%, hypertensive response, no ST changes, apical thinning with very mild apical ischemia;  c. 09/21/2016 Cardiac CTA: Ca2+ score = 717, 90th percentile. Extensive CAD w/ mod stenosis in distal LM/ostial LAD, 50% RPLV-->rec Cath   Carotid arterial disease w/ left carotid bruit (HCC)    bilateral 40-59% stenosis by dopplers 2018   Diverticulosis    Hyperlipidemia    OA (osteoarthritis)    Stroke (cerebrum) (HCC)    a. 07/2016 L facial droop-->Head CT: small area of low attenuation in the right frontoparietal region c/w acute or subacute lacunar infarct; 07/2016 MRI/A brain: acute infarct of right centrum semiovale and possibly posterior limb of right internal capsule. Mild to moderate multifocal narrowing of the MCA arteries w/o occlusion.   Type II diabetes mellitus (HCC)    a. 07/2016 A1c 7.3.   White coat hypertension     Family History  Problem Relation Age  of Onset   Cancer Mother        breast   Breast cancer Mother    CAD Neg Hx     Social History   Socioeconomic History   Marital status: Married    Spouse name: Not on file   Number of children: Not on file   Years of education: Not on file   Highest education level: 12th grade  Occupational History   Not on file  Tobacco Use   Smoking status: Never   Smokeless tobacco: Never  Vaping Use   Vaping status: Never Used  Substance and Sexual Activity   Alcohol use: No    Alcohol/week: 0.0 standard drinks of alcohol   Drug use: No   Sexual activity: Not on file  Other Topics Concern   Not on file  Social History Narrative   Lives in [place] with husband.   Retired.   hh of 2   No pets   Social Drivers of Corporate investment banker Strain: Low Risk  (01/15/2024)   Overall Financial Resource Strain (CARDIA)    Difficulty of Paying Living Expenses: Not hard at all  Food Insecurity: No Food Insecurity (01/15/2024)   Hunger Vital Sign    Worried About Running Out of Food in the Last Year: Never true    Ran Out of Food in the Last Year: Never true  Transportation Needs: No Transportation Needs (01/15/2024)  PRAPARE - Administrator, Civil Service (Medical): No    Lack of Transportation (Non-Medical): No  Physical Activity: Inactive (01/15/2024)   Exercise Vital Sign    Days of Exercise per Week: 0 days    Minutes of Exercise per Session: 0 min  Stress: No Stress Concern Present (01/15/2024)   [employer] of Occupational Health - Occupational Stress Questionnaire    Feeling of Stress : Not at all  Social Connections: Socially Integrated (01/15/2024)   Social Connection and Isolation Panel [NHANES]    Frequency of Communication with Friends and Family: More than three times a week    Frequency of Social Gatherings with Friends and Family: More than three times a week    Attends Religious Services: More than 4 times per year    Active Member of [employer] or  Organizations: Yes    Attends Engineer, structural: More than 4 times per year    Marital Status: Married    Outpatient Medications Prior to Visit  Medication Sig Dispense Refill   acetaminophen (TYLENOL) 650 MG CR tablet Take 650 mg by mouth daily as needed for pain.     aspirin EC 81 MG tablet Take 1 tablet (81 mg total) by mouth daily.     Calcium Carb-Cholecalciferol (CALCIUM 600/VITAMIN D3) 600-20 MG-MCG TABS Take 1 tablet by mouth daily.     carvedilol (COREG) 12.5 MG tablet Take 1 tablet by mouth twice daily with meals or as directed 180 tablet 2   Cholecalciferol (VITAMIN D3) 10 MCG (400 UNIT) tablet Take 400 Units by mouth daily.     clopidogrel (PLAVIX) 75 MG tablet Take 1 tablet (75 mg total) by mouth daily. 90 tablet 4   Cyanocobalamin (B-12 PO) Take 1,000 mcg by mouth 2 (two) times a week.     diclofenac Sodium (VOLTAREN) 1 % GEL Apply 2 g topically at bedtime as needed (Pain).     ferrous sulfate 325 (65 FE) MG tablet Take 325 mg by mouth 2 (two) times a week.     gabapentin (NEURONTIN) 100 MG capsule Take 1 capsule (100 mg total) by mouth at bedtime. 30 capsule 0   metFORMIN (GLUCOPHAGE) 500 MG tablet Take 1 tablet by mouth every morning and 2 tablets by mouth every evening 270 tablet 2   Multiple Vitamins-Minerals (ONE-A-DAY WOMENS 50+ PO) Take 1 tablet by mouth daily.     nitroGLYCERIN (NITROSTAT) 0.4 MG SL tablet Place 1 tablet (0.4 mg total) under the tongue every 5 (five) minutes x 3 doses as needed for chest pain. 25 tablet 12   rosuvastatin (CRESTOR) 5 MG tablet Take 1 tablet by mouth daily if tolerated. Please keep September appointment. 30 tablet 1   No facility-administered medications prior to visit.     EXAM:  There were no vitals taken for this visit.  There is no height or weight on file to calculate BMI.  GENERAL: vitals reviewed and listed above, alert, oriented, appears well hydrated and in no acute distress HEENT: atraumatic, conjunctiva   clear, no obvious abnormalities on inspection of external nose and ears OP : no lesion edema or exudate  NECK: no obvious masses on inspection palpation  LUNGS: clear to auscultation bilaterally, no wheezes, rales or rhonchi, good air movement CV: HRRR, no clubbing cyanosis or  peripheral edema nl cap refill  MS: moves all extremities without noticeable focal  abnormality PSYCH: pleasant and cooperative, no obvious depression or anxiety Lab Results  Component Value Date  WBC 8.3 11/17/2023   HGB 8.9 (L) 11/17/2023   HCT 27.0 (L) 11/17/2023   PLT 177 11/17/2023   GLUCOSE 131 (H) 11/17/2023   CHOL 199 09/13/2023   TRIG 168.0 (H) 09/13/2023   HDL 41.10 09/13/2023   LDLDIRECT 136.0 05/05/2016   LDLCALC 124 (H) 09/13/2023   ALT 9 09/13/2023   AST 18 09/13/2023   NA 141 11/17/2023   K 4.1 11/17/2023   CL 108 11/17/2023   CREATININE 0.96 11/17/2023   BUN 17 11/17/2023   CO2 25 11/17/2023   TSH 4.15 09/13/2023   INR 1.0 09/22/2016   HGBA1C 6.9 (H) 09/13/2023   MICROALBUR 2.3 (H) 07/19/2022   BP Readings from Last 3 Encounters:  12/26/23 (!) 192/82  11/17/23 (!) 153/56  11/07/23 (!) 163/78    ASSESSMENT AND PLAN:  Discussed the following assessment and plan:  No diagnosis found.  -Patient advised to return or notify health care team  if  new concerns arise.  There are no Patient Instructions on file for this visit.   Neta Mends. Serine Kea M.D.

## 2024-01-17 ENCOUNTER — Encounter: Payer: Self-pay | Admitting: Internal Medicine

## 2024-01-17 ENCOUNTER — Ambulatory Visit: Payer: Medicare Other | Admitting: Internal Medicine

## 2024-01-17 VITALS — BP 170/68 | HR 63 | Temp 97.9°F | Ht 61.5 in | Wt 123.0 lb

## 2024-01-17 DIAGNOSIS — E1165 Type 2 diabetes mellitus with hyperglycemia: Secondary | ICD-10-CM

## 2024-01-17 DIAGNOSIS — E1151 Type 2 diabetes mellitus with diabetic peripheral angiopathy without gangrene: Secondary | ICD-10-CM | POA: Diagnosis not present

## 2024-01-17 DIAGNOSIS — D649 Anemia, unspecified: Secondary | ICD-10-CM | POA: Diagnosis not present

## 2024-01-17 DIAGNOSIS — E119 Type 2 diabetes mellitus without complications: Secondary | ICD-10-CM

## 2024-01-17 DIAGNOSIS — I739 Peripheral vascular disease, unspecified: Secondary | ICD-10-CM | POA: Diagnosis not present

## 2024-01-17 DIAGNOSIS — Z7984 Long term (current) use of oral hypoglycemic drugs: Secondary | ICD-10-CM

## 2024-01-17 LAB — POCT GLYCOSYLATED HEMOGLOBIN (HGB A1C): Hemoglobin A1C: 6.4 % — AB (ref 4.0–5.6)

## 2024-01-17 LAB — CBC WITH DIFFERENTIAL/PLATELET
Basophils Absolute: 0 10*3/uL (ref 0.0–0.1)
Basophils Relative: 0.4 % (ref 0.0–3.0)
Eosinophils Absolute: 0.5 10*3/uL (ref 0.0–0.7)
Eosinophils Relative: 8.2 % — ABNORMAL HIGH (ref 0.0–5.0)
HCT: 36.7 % (ref 36.0–46.0)
Hemoglobin: 12.2 g/dL (ref 12.0–15.0)
Lymphocytes Relative: 12 % (ref 12.0–46.0)
Lymphs Abs: 0.8 10*3/uL (ref 0.7–4.0)
MCHC: 33.3 g/dL (ref 30.0–36.0)
MCV: 93.9 fl (ref 78.0–100.0)
Monocytes Absolute: 0.5 10*3/uL (ref 0.1–1.0)
Monocytes Relative: 8.7 % (ref 3.0–12.0)
Neutro Abs: 4.4 10*3/uL (ref 1.4–7.7)
Neutrophils Relative %: 70.7 % (ref 43.0–77.0)
Platelets: 184 10*3/uL (ref 150.0–400.0)
RBC: 3.91 Mil/uL (ref 3.87–5.11)
RDW: 12.8 % (ref 11.5–15.5)
WBC: 6.3 10*3/uL (ref 4.0–10.5)

## 2024-01-17 LAB — MICROALBUMIN / CREATININE URINE RATIO
Creatinine,U: 111.3 mg/dL
Microalb Creat Ratio: 16.2 mg/g (ref 0.0–30.0)
Microalb, Ur: 1.8 mg/dL (ref 0.0–1.9)

## 2024-01-17 NOTE — Patient Instructions (Addendum)
 Good to see you today. A1c is controlled.   You should have refills ont the Plavix per dr Chestine Spore  let us know if a problem.  Continue lifestyle intervention healthy eating and exercise .   And bp monitoring  if we need to we can increase  the carvedilol to 1.5 twice a day or other.

## 2024-01-21 ENCOUNTER — Encounter: Payer: Self-pay | Admitting: Internal Medicine

## 2024-01-21 NOTE — Progress Notes (Signed)
 Anemia is better , urine shows no excess protein  . No change in plans

## 2024-01-24 ENCOUNTER — Ambulatory Visit: Payer: Medicare Other

## 2024-01-24 ENCOUNTER — Encounter (HOSPITAL_COMMUNITY): Payer: Medicare Other

## 2024-02-02 ENCOUNTER — Other Ambulatory Visit: Payer: Self-pay | Admitting: Internal Medicine

## 2024-02-02 NOTE — Telephone Encounter (Signed)
 Copied from CRM [id number]. Topic: Clinical - Medication Refill >> Feb 02, 2024 11:04 AM Elizebeth Brooking wrote: Most Recent Primary Care Visit:  Provider: Berniece Andreas K  Department: [facility]  Visit Type: OFFICE VISIT  Date: 01/17/2024  Medication: rosuvastatin (CRESTOR) 5 MG tablet  Has the patient contacted their pharmacy? Yes (Agent: If no, request that the patient contact the pharmacy for the refill. If patient does not wish to contact the pharmacy document the reason why and proceed with request.) (Agent: If yes, when and what did the pharmacy advise?)  Is this the correct pharmacy for this prescription? Yes If no, delete pharmacy and type the correct one.  This is the patient's preferred pharmacy:    [facility] - [place], [place] - [address] [address] [place] [address] Phone: [phone] Fax: [phone]     Has the prescription been filled recently? No  Is the patient out of the medication? Yes  Has the patient been seen for an appointment in the last year OR does the patient have an upcoming appointment? Yes  Can we respond through MyChart? Yes  Agent: Please be advised that Rx refills may take up to 3 business days. We ask that you follow-up with your pharmacy.

## 2024-02-05 MED ORDER — ROSUVASTATIN CALCIUM 5 MG PO TABS: ORAL_TABLET | ORAL | 1 refills | Status: DC

## 2024-02-12 ENCOUNTER — Encounter (HOSPITAL_BASED_OUTPATIENT_CLINIC_OR_DEPARTMENT_OTHER): Payer: Self-pay | Admitting: Cardiology

## 2024-02-12 ENCOUNTER — Ambulatory Visit (INDEPENDENT_AMBULATORY_CARE_PROVIDER_SITE_OTHER): Payer: Medicare Other | Admitting: Cardiology

## 2024-02-12 VITALS — BP 132/72 | HR 66 | Ht 61.5 in | Wt 125.2 lb

## 2024-02-12 DIAGNOSIS — Z8673 Personal history of transient ischemic attack (TIA), and cerebral infarction without residual deficits: Secondary | ICD-10-CM

## 2024-02-12 DIAGNOSIS — Z7189 Other specified counseling: Secondary | ICD-10-CM

## 2024-02-12 DIAGNOSIS — E7800 Pure hypercholesterolemia, unspecified: Secondary | ICD-10-CM

## 2024-02-12 DIAGNOSIS — I2510 Atherosclerotic heart disease of native coronary artery without angina pectoris: Secondary | ICD-10-CM

## 2024-02-12 DIAGNOSIS — I6523 Occlusion and stenosis of bilateral carotid arteries: Secondary | ICD-10-CM

## 2024-02-12 DIAGNOSIS — I739 Peripheral vascular disease, unspecified: Secondary | ICD-10-CM

## 2024-02-12 MED ORDER — ROSUVASTATIN CALCIUM 5 MG PO TABS: ORAL_TABLET | ORAL | 1 refills | Status: DC

## 2024-02-12 MED ORDER — CLOPIDOGREL BISULFATE 75 MG PO TABS: 75.0000 mg | ORAL_TABLET | Freq: Every day | ORAL | 3 refills | Status: AC

## 2024-02-12 NOTE — Patient Instructions (Signed)
 Medication Instructions:  Your physician recommends that you continue on your current medications as directed. Please refer to the Current Medication list given to you today.   *If you need a refill on your cardiac medications before your next appointment, please call your pharmacy*  Lab Work: Your physician recommends that you return for lab work in 6-8 weeks: LIPIDS and LPA  If you have labs (blood work) drawn today and your tests are completely normal, you will receive your results only by: MyChart Message (if you have MyChart) OR A paper copy in the mail If you have any lab test that is abnormal or we need to change your treatment, we will call you to review the results.  Follow-Up: At [facility], you and your health needs are our priority.  As part of our continuing mission to provide you with exceptional heart care, our providers are all part of one team.  This team includes your primary Cardiologist (physician) and Advanced Practice Providers or APPs (Physician Assistants and Nurse Practitioners) who all work together to provide you with the care you need, when you need it.  Your next appointment:   6 month(s)  Provider:   Jodelle Red, MD    We recommend signing up for the patient portal called "MyChart".  Sign up information is provided on this After Visit Summary.  MyChart is used to connect with patients for Virtual Visits (Telemedicine).  Patients are able to view lab/test results, encounter notes, upcoming appointments, etc.  Non-urgent messages can be sent to your provider as well.   To learn more about what you can do with MyChart, go to [email or web address].

## 2024-02-12 NOTE — Progress Notes (Signed)
 - Cardiology Office Note:  .   Date:  02/12/2024  ID:  Sharon Wilson, DOB 01-Nov-1941, MRN [id number] PCP: Madelin Headings, MD  [facility] Providers Cardiologist:  Jodelle Red, MD {  History of Present Illness: .   Sharon Wilson is a 83 y.o. female with PMH CVA in 2017, carotid disease, PAD, coronary disease, type II diabetes. She was previously followed by Dr. Mayford Knife, last seen by her in 2021. She established care with me on 02/12/24.   Pertinent CV history: Had CVA in 2017. Had CT coronary and then cath in 2017 showing 3 vessel CAD. CABG declined at that time given recent CVA, was medically managed. More recently, she was evaluated by Dr. Chestine Spore in vascular surgery for R foot pain. ABIs, dopplers concerning for critical limb ischemia. She underwent peripheral catheterization and revascularization of R above-knee popliteal occlusion on 11/16/23 by Dr. Chestine Spore.   Today: Reviewed referral note from Dr. Fabian Sharp dated 09/20/23. Patient was previously seen at [facility] street but prefers [facility] location. Concern was for lipid management, concern regarding statin side effects.   Overall feels that she is doing well. Feels so much better in the right foot/leg since her vascular intervention. Back to walking, working to increase this back to her prior level of 2 miles every morning. Her knees are her limitation at this time.  We reviewed her prior stress test. She was limited by her knees; we discussed with her carotid disease, lexiscan has risk.  Had lipids done 09/13/23. Was not on cholesterol medication at that time. Tchol 199, TG 168, HDL 41, LDL 124. She restarted this several months ago but has been out for about a week as she was unable to get refills. I sent this into [facility] today per her preference. No side effects on this dose. She has had issues with other statins in the past and did not tolerate. Has not had lpa drawn previously.  ROS: Denies chest pain,  shortness of breath at rest or with normal exertion. No PND, orthopnea, LE edema or unexpected weight gain. No syncope or palpitations. ROS otherwise negative except as noted.   Studies Reviewed: Marland Kitchen    EKG:       Physical Exam:   VS:  BP 132/72   Pulse 66   Ht 5' 1.5" (1.562 m)   Wt 125 lb 3.2 oz (56.8 kg)   SpO2 99%   BMI 23.27 kg/m    Wt Readings from Last 3 Encounters:  02/12/24 125 lb 3.2 oz (56.8 kg)  01/17/24 123 lb (55.8 kg)  01/15/24 124 lb (56.2 kg)    GEN: Well nourished, well developed in no acute distress HEENT: Normal, moist mucous membranes NECK: No JVD CARDIAC: regular rhythm, normal S1 and S2, no rubs or gallops. 1/6 systolic murmur. VASCULAR: Radial and DP pulses 2+ bilaterally. R carotid bruit RESPIRATORY:  Clear to auscultation without rales, wheezing or rhonchi  ABDOMEN: Soft, non-tender, non-distended MUSCULOSKELETAL:  Ambulates independently SKIN: Warm and dry, no edema NEUROLOGIC:  Alert and oriented x 3. No focal neuro deficits noted. PSYCHIATRIC:  Normal affect    ASSESSMENT AND PLAN: .    Severe ASCVD, with: History of CVA Asymptomatic severe 3V CAD Carotid disease PAD s/p peripheral intervention Hypercholesterolemia Prior statin intolerance -we reviewed her prior workup and treatment plan -given the severity of her disease, will check lpa -tolerating low dose rosuvastatin, has been out of this for about a week. Refilled, recheck lipids in 6-8 weeks.  LDL goal <70, if not at goal, increase rosuvastatin dose -she has no angina. We discussed her prior workup. She cannot treadmill. Would avoid lexiscan with carotid disease. We discussed that prior consideration was for CABG, but without symptoms, risk of CABG vs. Potential benefit uncertain. She understands, does not want to proceed with further workup/consideration of CABG unless she develops symptoms, and we discussed that with her carotid disease she would have risk going on pump  CV risk  counseling and prevention -recommend heart healthy/Mediterranean diet, with whole grains, fruits, vegetable, fish, lean meats, nuts, and olive oil. Limit salt. -recommend moderate walking, 3-5 times/week for 30-50 minutes each session. Aim for at least 150 minutes.week. Goal should be pace of 3 miles/hours, or walking 1.5 miles in 30 minutes -recommend avoidance of tobacco products. Avoid excess alcohol.  Dispo: 6 mos or sooner as needed  Total time of encounter: I spent 41 minutes dedicated to the care of this patient on the date of this encounter to include pre-visit review of records, face-to-face time with the patient discussing conditions above, and clinical documentation with the electronic health record. We specifically spent time today discussing her extensive ASCVD, review of her testing, recommendations for statins, LDL goals, discussion of symptoms/what to watch for.   Signed, Jodelle Red, MD   Jodelle Red, MD, PhD, [facility] [place]  [facility]  [place]  Heart & Vascular at [facility] at [facility] [address] [place], [place] [address] [phone]

## 2024-06-28 LAB — LIPID PANEL
Chol/HDL Ratio: 2.9 ratio (ref 0.0–4.4)
Cholesterol, Total: 123 mg/dL (ref 100–199)
HDL: 42 mg/dL (ref >39)
LDL Chol Calc (NIH): 60 mg/dL (ref 0–99)
Triglycerides: 116 mg/dL (ref 0–149)
VLDL Cholesterol Cal: 21 mg/dL (ref 5–40)

## 2024-06-28 LAB — LIPOPROTEIN A (LPA): Lipoprotein (a): 57.6 nmol/L (ref <75.0)

## 2024-07-09 ENCOUNTER — Ambulatory Visit: Payer: Medicare Other

## 2024-07-09 ENCOUNTER — Encounter (HOSPITAL_COMMUNITY): Payer: Medicare Other

## 2024-07-17 ENCOUNTER — Ambulatory Visit: Admitting: Internal Medicine

## 2024-07-22 ENCOUNTER — Ambulatory Visit (HOSPITAL_BASED_OUTPATIENT_CLINIC_OR_DEPARTMENT_OTHER): Payer: Self-pay | Admitting: Cardiology

## 2024-07-23 NOTE — Progress Notes (Unsigned)
 No chief complaint on file.   HPI: Sharon Wilson 83 y.o. come in for Chronic disease management   Metformin   carvedilol  ROS: See pertinent positives and negatives per HPI.  Past Medical History:  Diagnosis Date   Abnormal echocardiogram    a.07/2016 Echo: EF 50-55%, basal and mid inferolateral AK.   Agatston coronary artery calcium  score greater than 400    a. 07/2016 Abnl Echo with basal and mid inferolateral AK, EF 50-55%; b. 09/01/2016 Ex MV: EF 46%, hypertensive response, no ST changes, apical thinning with very mild apical ischemia;  c. 09/21/2016 Cardiac CTA: Ca2+ score = 717, 90th percentile. Extensive CAD w/ mod stenosis in distal LM/ostial LAD, 50% RPLV-->rec Cath.   Anemia    hysterectomy   CAD (coronary artery disease)    a. 09/01/2016 Ex MV: EF 46%, hypertensive response, no ST changes, apical thinning with very mild apical ischemia;  c. 09/21/2016 Cardiac CTA: Ca2+ score = 717, 90th percentile. Extensive CAD w/ mod stenosis in distal LM/ostial LAD, 50% RPLV-->rec Cath   Carotid arterial disease w/ left carotid bruit (HCC)    bilateral 40-59% stenosis by dopplers 2018   Diverticulosis    Hyperlipidemia    OA (osteoarthritis)    Stroke (cerebrum) (HCC)    a. 07/2016 L facial droop-->Head CT: small area of low attenuation in the right frontoparietal region c/w acute or subacute lacunar infarct; 07/2016 MRI/A brain: acute infarct of right centrum semiovale and possibly posterior limb of right internal capsule. Mild to moderate multifocal narrowing of the MCA arteries w/o occlusion.   Type II diabetes mellitus (HCC)    a. 07/2016 A1c 7.3.   White coat hypertension     Family History  Problem Relation Age of Onset   Cancer Mother        breast   Breast cancer Mother    CAD Neg Hx     Social History   Socioeconomic History   Marital status: Married    Spouse name: Not on file   Number of children: Not on file   Years of education: Not on file   Highest  education level: 12th grade  Occupational History   Not on file  Tobacco Use   Smoking status: Never   Smokeless tobacco: Never  Vaping Use   Vaping status: Never Used  Substance and Sexual Activity   Alcohol use: No    Alcohol/week: 0.0 standard drinks of alcohol   Drug use: No   Sexual activity: Not on file  Other Topics Concern   Not on file  Social History Narrative   Lives in [place] with husband.   Retired.   hh of 2   No pets   Social Drivers of Corporate investment banker Strain: Low Risk  (07/23/2024)   Overall Financial Resource Strain (CARDIA)    Difficulty of Paying Living Expenses: Not hard at all  Food Insecurity: No Food Insecurity (07/23/2024)   Hunger Vital Sign    Worried About Running Out of Food in the Last Year: Never true    Ran Out of Food in the Last Year: Never true  Transportation Needs: No Transportation Needs (07/23/2024)   PRAPARE - Administrator, Civil Service (Medical): No    Lack of Transportation (Non-Medical): No  Physical Activity: Unknown (07/23/2024)   Exercise Vital Sign    Days of Exercise per Week: Patient declined    Minutes of Exercise per Session: Not on file  Stress: No Stress  Concern Present (07/23/2024)   [employer] of Occupational Health - Occupational Stress Questionnaire    Feeling of Stress: Not at all  Social Connections: Socially Integrated (07/23/2024)   Social Connection and Isolation Panel    Frequency of Communication with Friends and Family: Three times a week    Frequency of Social Gatherings with Friends and Family: Once a week    Attends Religious Services: 1 to 4 times per year    Active Member of [employer] or Organizations: Yes    Attends Engineer, structural: More than 4 times per year    Marital Status: Married    Outpatient Medications Prior to Visit  Medication Sig Dispense Refill   acetaminophen  (TYLENOL ) 650 MG CR tablet Take 650 mg by mouth daily as needed for pain.     aspirin  EC  81 MG tablet Take 1 tablet (81 mg total) by mouth daily.     Calcium  Carb-Cholecalciferol (CALCIUM  600/VITAMIN D3) 600-20 MG-MCG TABS Take 1 tablet by mouth daily.     carvedilol  (COREG ) 12.5 MG tablet Take 1 tablet by mouth twice daily with meals or as directed 180 tablet 2   Cholecalciferol (VITAMIN D3) 10 MCG (400 UNIT) tablet Take 400 Units by mouth daily.     clopidogrel  (PLAVIX ) 75 MG tablet Take 1 tablet (75 mg total) by mouth daily. 90 tablet 3   Cyanocobalamin  (B-12 PO) Take 1,000 mcg by mouth 2 (two) times a week.     ferrous sulfate 325 (65 FE) MG tablet Take 325 mg by mouth 2 (two) times a week.     metFORMIN  (GLUCOPHAGE ) 500 MG tablet Take 1 tablet by mouth every morning and 2 tablets by mouth every evening 270 tablet 2   Multiple Vitamins-Minerals (ONE-A-DAY WOMENS 50+ PO) Take 1 tablet by mouth daily.     nitroGLYCERIN  (NITROSTAT ) 0.4 MG SL tablet Place 1 tablet (0.4 mg total) under the tongue every 5 (five) minutes x 3 doses as needed for chest pain. 25 tablet 12   rosuvastatin  (CRESTOR ) 5 MG tablet Take 1 tablet by mouth daily 90 tablet 1   No facility-administered medications prior to visit.     EXAM:  There were no vitals taken for this visit.  There is no height or weight on file to calculate BMI.  GENERAL: vitals reviewed and listed above, alert, oriented, appears well hydrated and in no acute distress HEENT: atraumatic, conjunctiva  clear, no obvious abnormalities on inspection of external nose and ears OP : no lesion edema or exudate  NECK: no obvious masses on inspection palpation  LUNGS: clear to auscultation bilaterally, no wheezes, rales or rhonchi, good air movement CV: HRRR, no clubbing cyanosis or  peripheral edema nl cap refill  MS: moves all extremities without noticeable focal  abnormality PSYCH: pleasant and cooperative, no obvious depression or anxiety Lab Results  Component Value Date   WBC 6.3 01/17/2024   HGB 12.2 01/17/2024   HCT 36.7 01/17/2024    PLT 184.0 01/17/2024   GLUCOSE 131 (H) 11/17/2023   CHOL 123 06/27/2024   TRIG 116 06/27/2024   HDL 42 06/27/2024   LDLDIRECT 136.0 05/05/2016   LDLCALC 60 06/27/2024   ALT 9 09/13/2023   AST 18 09/13/2023   NA 141 11/17/2023   K 4.1 11/17/2023   CL 108 11/17/2023   CREATININE 0.96 11/17/2023   BUN 17 11/17/2023   CO2 25 11/17/2023   TSH 4.15 09/13/2023   INR 1.0 09/22/2016   HGBA1C 6.4 (A)  01/17/2024   MICROALBUR 1.8 01/17/2024   BP Readings from Last 3 Encounters:  02/12/24 132/72  01/17/24 (!) 170/68  12/26/23 (!) 192/82    ASSESSMENT AND PLAN:  Discussed the following assessment and plan:  Essential hypertension  Coronary artery disease involving native coronary artery of native heart without angina pectoris  PAD (peripheral artery disease) (HCC)  Pure hypercholesterolemia Lab due  -Patient advised to return or notify health care team  if  new concerns arise.  There are no Patient Instructions on file for this visit.   Jamye Balicki K. Davin Archuletta M.D.

## 2024-07-24 ENCOUNTER — Ambulatory Visit: Admitting: Internal Medicine

## 2024-07-24 ENCOUNTER — Other Ambulatory Visit: Payer: Self-pay | Admitting: Internal Medicine

## 2024-07-24 ENCOUNTER — Encounter: Payer: Self-pay | Admitting: Internal Medicine

## 2024-07-24 VITALS — BP 128/64 | HR 64 | Temp 97.6°F | Ht 61.5 in | Wt 122.6 lb

## 2024-07-24 DIAGNOSIS — E785 Hyperlipidemia, unspecified: Secondary | ICD-10-CM

## 2024-07-24 DIAGNOSIS — I2510 Atherosclerotic heart disease of native coronary artery without angina pectoris: Secondary | ICD-10-CM

## 2024-07-24 DIAGNOSIS — Z79899 Other long term (current) drug therapy: Secondary | ICD-10-CM

## 2024-07-24 DIAGNOSIS — E119 Type 2 diabetes mellitus without complications: Secondary | ICD-10-CM

## 2024-07-24 DIAGNOSIS — I10 Essential (primary) hypertension: Secondary | ICD-10-CM

## 2024-07-24 DIAGNOSIS — I739 Peripheral vascular disease, unspecified: Secondary | ICD-10-CM

## 2024-07-24 DIAGNOSIS — E7800 Pure hypercholesterolemia, unspecified: Secondary | ICD-10-CM

## 2024-07-24 DIAGNOSIS — Z8673 Personal history of transient ischemic attack (TIA), and cerebral infarction without residual deficits: Secondary | ICD-10-CM

## 2024-07-24 LAB — POCT GLYCOSYLATED HEMOGLOBIN (HGB A1C): Hemoglobin A1C: 6.7 % — AB (ref 4.0–5.6)

## 2024-07-24 MED ORDER — METFORMIN HCL 500 MG PO TABS: ORAL_TABLET | ORAL | 2 refills | Status: AC

## 2024-07-24 MED ORDER — CARVEDILOL 12.5 MG PO TABS: ORAL_TABLET | ORAL | 2 refills | Status: AC

## 2024-07-24 NOTE — Progress Notes (Signed)
 Labs to be done pre  next visit cpe about 6 months  as possible

## 2024-07-24 NOTE — Patient Instructions (Addendum)
 A1c is 6.7  acceptable.  Refill medication and BP monitoring .  Plan fasting lab full panel  pre visit in about   6 months   .Attention to healthy eating  as planned   Glad you are doing well .

## 2024-07-26 ENCOUNTER — Other Ambulatory Visit: Payer: Self-pay

## 2024-07-26 DIAGNOSIS — I2510 Atherosclerotic heart disease of native coronary artery without angina pectoris: Secondary | ICD-10-CM

## 2024-07-26 DIAGNOSIS — Z8673 Personal history of transient ischemic attack (TIA), and cerebral infarction without residual deficits: Secondary | ICD-10-CM

## 2024-07-26 DIAGNOSIS — I739 Peripheral vascular disease, unspecified: Secondary | ICD-10-CM

## 2024-07-26 DIAGNOSIS — E7800 Pure hypercholesterolemia, unspecified: Secondary | ICD-10-CM

## 2024-07-26 DIAGNOSIS — I6523 Occlusion and stenosis of bilateral carotid arteries: Secondary | ICD-10-CM

## 2024-07-26 LAB — HM DIABETES EYE EXAM

## 2024-07-26 MED ORDER — ROSUVASTATIN CALCIUM 5 MG PO TABS: ORAL_TABLET | ORAL | 1 refills | Status: AC

## 2024-09-17 ENCOUNTER — Ambulatory Visit (HOSPITAL_BASED_OUTPATIENT_CLINIC_OR_DEPARTMENT_OTHER)
Admission: RE | Admit: 2024-09-17 | Discharge: 2024-09-17 | Disposition: A | Discharge status: Home / Self Care | Source: Ambulatory Visit | Attending: Vascular Surgery | Admitting: Vascular Surgery

## 2024-09-17 ENCOUNTER — Ambulatory Visit (HOSPITAL_COMMUNITY)
Admission: RE | Admit: 2024-09-17 | Discharge: 2024-09-17 | Disposition: A | Discharge status: Home / Self Care | Source: Ambulatory Visit | Attending: Vascular Surgery | Admitting: Vascular Surgery

## 2024-09-17 ENCOUNTER — Ambulatory Visit: Admitting: Physician Assistant

## 2024-09-17 VITALS — BP 203/83 | HR 73 | Temp 97.7°F | Wt 123.2 lb

## 2024-09-17 DIAGNOSIS — I739 Peripheral vascular disease, unspecified: Secondary | ICD-10-CM

## 2024-09-17 DIAGNOSIS — I70213 Atherosclerosis of native arteries of extremities with intermittent claudication, bilateral legs: Secondary | ICD-10-CM | POA: Insufficient documentation

## 2024-09-17 DIAGNOSIS — I6523 Occlusion and stenosis of bilateral carotid arteries: Secondary | ICD-10-CM | POA: Insufficient documentation

## 2024-09-17 DIAGNOSIS — I70221 Atherosclerosis of native arteries of extremities with rest pain, right leg: Secondary | ICD-10-CM

## 2024-09-17 LAB — VAS US ABI WITH/WO TBI
Left ABI: 0.68
Right ABI: 0.91

## 2024-09-19 ENCOUNTER — Other Ambulatory Visit: Payer: Self-pay | Admitting: *Deleted

## 2024-09-19 DIAGNOSIS — I6523 Occlusion and stenosis of bilateral carotid arteries: Secondary | ICD-10-CM

## 2024-09-21 NOTE — Progress Notes (Signed)
 Office Note   History of Present Illness   Sharon Wilson is a 83 y.o. (03-27-1941) female who presents for PAD follow-up.  She has a history of right lower extremity angiogram with right TP trunk and peroneal artery angioplasty and right above-knee popliteal artery and SFA angioplasty with stent placement x 2 on 11/16/2023 by Dr. Gretta.  This was done for critical limb ischemia with rest pain.  She also has known carotid disease bilaterally, which is 60 to 79% on the right and 40 to 59% on the left.  She returns today for follow-up.  She has no complaints at today's office visit.  She denies any claudication, rest pain, or tissue loss.  She also denies any strokelike symptoms such as slurred speech, facial droop, sudden weakness/numbness, or sudden visual changes.  She says that her blood pressure is well-controlled at home, however she has severe whitecoat syndrome causing her blood pressure to be elevated today in the office.  Current Outpatient Medications  Medication Sig Dispense Refill   acetaminophen  (TYLENOL ) 650 MG CR tablet Take 650 mg by mouth daily as needed for pain. (Patient taking differently: Take 325 mg by mouth daily as needed for pain.)     aspirin  EC 81 MG tablet Take 1 tablet (81 mg total) by mouth daily.     Calcium  Carb-Cholecalciferol (CALCIUM  600/VITAMIN D3) 600-20 MG-MCG TABS Take 1 tablet by mouth daily.     carvedilol  (COREG ) 12.5 MG tablet Take 1 tablet by mouth twice daily with meals or as directed 180 tablet 2   Cholecalciferol (VITAMIN D3) 10 MCG (400 UNIT) tablet Take 400 Units by mouth daily.     clopidogrel  (PLAVIX ) 75 MG tablet Take 1 tablet (75 mg total) by mouth daily. 90 tablet 3   Cyanocobalamin  (B-12 PO) Take 1,000 mcg by mouth 2 (two) times a week.     ferrous sulfate 325 (65 FE) MG tablet Take 325 mg by mouth 2 (two) times a week.     metFORMIN  (GLUCOPHAGE ) 500 MG tablet Take 1 tablet by mouth every morning and 2 tablets by mouth every evening 270  tablet 2   Multiple Vitamins-Minerals (ONE-A-DAY WOMENS 50+ PO) Take 1 tablet by mouth daily.     nitroGLYCERIN  (NITROSTAT ) 0.4 MG SL tablet Place 1 tablet (0.4 mg total) under the tongue every 5 (five) minutes x 3 doses as needed for chest pain. 25 tablet 12   rosuvastatin  (CRESTOR ) 5 MG tablet Take 1 tablet by mouth daily 90 tablet 1   No current facility-administered medications for this visit.    REVIEW OF SYSTEMS (negative unless checked):   Cardiac:  []  Chest pain or chest pressure? []  Shortness of breath upon activity? []  Shortness of breath when lying flat? []  Irregular heart rhythm?  Vascular:  []  Pain in calf, thigh, or hip brought on by walking? []  Pain in feet at night that wakes you up from your sleep? []  Blood clot in your veins? []  Leg swelling?  Pulmonary:  []  Oxygen at home? []  Productive cough? []  Wheezing?  Neurologic:  []  Sudden weakness in arms or legs? []  Sudden numbness in arms or legs? []  Sudden onset of difficult speaking or slurred speech? []  Temporary loss of vision in one eye? []  Problems with dizziness?  Gastrointestinal:  []  Blood in stool? []  Vomited blood?  Genitourinary:  []  Burning when urinating? []  Blood in urine?  Psychiatric:  []  Major depression  Hematologic:  []  Bleeding problems? []  Problems with blood  clotting?  Dermatologic:  []  Rashes or ulcers?  Constitutional:  []  Fever or chills?  Ear/Nose/Throat:  []  Change in hearing? []  Nose bleeds? []  Sore throat?  Musculoskeletal:  []  Back pain? []  Joint pain? []  Muscle pain?   Physical Examination   Vitals:   09/17/24 1244  BP: (!) 203/83  Pulse: 73  Temp: 97.7 F (36.5 C)  TempSrc: Temporal  Weight: 123 lb 3.2 oz (55.9 kg)   Body mass index is 22.9 kg/m.  General:  WDWN in NAD; vital signs documented above Gait: Not observed HENT: WNL, normocephalic Pulmonary: normal non-labored breathing Cardiac: Regular Abdomen: soft, NT, no masses Skin:  without rashes Vascular Exam/Pulses: Palpable femoral pulses bilaterally.  Brisk DP/PT Doppler signals bilaterally Extremities: without ischemic changes, without gangrene , without cellulitis; without open wounds;  Musculoskeletal: no muscle wasting or atrophy  Neurologic: A&O X 3;  No focal weakness or paresthesias are detected Psychiatric:  The pt has Normal affect.  Non-Invasive Vascular imaging   ABI (09/17/2024) R:  ABI: 0.91 (0.89),  PT: tri DP: tri TBI: 0.61 L:  ABI: 0.68 (0.74),  PT: tri DP: tri TBI: 0.31  RLE Arterial Duplex (09/17/2024) Patent right SFA/popliteal artery stent without stenosis  Medical Decision Making   [name] is a 83 y.o. female who presents for surveillance of PAD  Based on the patient's vascular studies, her ABIs on the right are essentially unchanged at 0.91.  Her ABIs on the left have slightly decreased from 0.74 to 0.68 Arterial duplex demonstrates a patent right SFA/popliteal artery stent without stenosis She denies any rest pain, claudication, or tissue loss.  On exam she has palpable femoral pulses bilaterally and brisk DP/PT Doppler signals bilaterally She also has a known history of carotid disease, and unfortunately no duplex was performed today.  Right carotid artery stenosis is known to be 60 to 79% and left carotid stenosis is 40 to 59%.  She denies any strokelike symptoms. She will continue her current medications and follow-up with our office in 1 month with carotid duplex   Ahmed Holster PA-C [facility] Office: [phone]  Clinic MD: Miguel

## 2024-10-29 ENCOUNTER — Ambulatory Visit

## 2024-10-29 ENCOUNTER — Ambulatory Visit (HOSPITAL_COMMUNITY)
Admission: RE | Admit: 2024-10-29 | Discharge: 2024-10-29 | Disposition: A | Discharge status: Home / Self Care | Source: Ambulatory Visit | Attending: Vascular Surgery | Admitting: Vascular Surgery

## 2024-10-29 VITALS — BP 205/78 | HR 62 | Temp 97.8°F | Ht 61.5 in | Wt 124.0 lb

## 2024-10-29 DIAGNOSIS — I6523 Occlusion and stenosis of bilateral carotid arteries: Secondary | ICD-10-CM | POA: Insufficient documentation

## 2024-10-29 NOTE — Progress Notes (Signed)
 "   Office Note   History of Present Illness   Sharon Wilson is a 83 y.o. (June 01, 1941) female who presents for surveillance of carotid artery stenosis. She has a history of right lower extremity angiogram with right TP trunk and peroneal artery angioplasty and right above-knee popliteal artery and SFA angioplasty with stent placement x 2 on 11/16/2023 by Dr. Gretta. This was done for critical limb ischemia with rest pain. She also has known carotid disease bilaterally, which is 60 to 79% on the right and 40 to 59% on the left.   She returns today for follow-up.  She has no complaints at today's office visit.  She denies any strokelike symptoms such as slurred speech, facial droop, sudden visual changes, or sudden weakness/numbness.  She is taking her daily aspirin , Plavix , and statin.  She is excited for Christmas and has already started cooking food for her family for Christmas dinner.  Current Outpatient Medications  Medication Sig Dispense Refill   acetaminophen  (TYLENOL ) 650 MG CR tablet Take 650 mg by mouth daily as needed for pain. (Patient taking differently: Take 325 mg by mouth daily as needed for pain.)     aspirin  EC 81 MG tablet Take 1 tablet (81 mg total) by mouth daily.     Calcium  Carb-Cholecalciferol (CALCIUM  600/VITAMIN D3) 600-20 MG-MCG TABS Take 1 tablet by mouth daily.     carvedilol  (COREG ) 12.5 MG tablet Take 1 tablet by mouth twice daily with meals or as directed 180 tablet 2   Cholecalciferol (VITAMIN D3) 10 MCG (400 UNIT) tablet Take 400 Units by mouth daily.     clopidogrel  (PLAVIX ) 75 MG tablet Take 1 tablet (75 mg total) by mouth daily. 90 tablet 3   Cyanocobalamin  (B-12 PO) Take 1,000 mcg by mouth 2 (two) times a week.     ferrous sulfate 325 (65 FE) MG tablet Take 325 mg by mouth 2 (two) times a week.     metFORMIN  (GLUCOPHAGE ) 500 MG tablet Take 1 tablet by mouth every morning and 2 tablets by mouth every evening 270 tablet 2   Multiple Vitamins-Minerals  (ONE-A-DAY WOMENS 50+ PO) Take 1 tablet by mouth daily.     nitroGLYCERIN  (NITROSTAT ) 0.4 MG SL tablet Place 1 tablet (0.4 mg total) under the tongue every 5 (five) minutes x 3 doses as needed for chest pain. 25 tablet 12   rosuvastatin  (CRESTOR ) 5 MG tablet Take 1 tablet by mouth daily 90 tablet 1   No current facility-administered medications for this visit.    REVIEW OF SYSTEMS (negative unless checked):   Cardiac:  []  Chest pain or chest pressure? []  Shortness of breath upon activity? []  Shortness of breath when lying flat? []  Irregular heart rhythm?  Vascular:  []  Pain in calf, thigh, or hip brought on by walking? []  Pain in feet at night that wakes you up from your sleep? []  Blood clot in your veins? []  Leg swelling?  Pulmonary:  []  Oxygen at home? []  Productive cough? []  Wheezing?  Neurologic:  []  Sudden weakness in arms or legs? []  Sudden numbness in arms or legs? []  Sudden onset of difficult speaking or slurred speech? []  Temporary loss of vision in one eye? []  Problems with dizziness?  Gastrointestinal:  []  Blood in stool? []  Vomited blood?  Genitourinary:  []  Burning when urinating? []  Blood in urine?  Psychiatric:  []  Major depression  Hematologic:  []  Bleeding problems? []  Problems with blood clotting?  Dermatologic:  []  Rashes or ulcers?  Constitutional:  []  Fever or chills?  Ear/Nose/Throat:  []  Change in hearing? []  Nose bleeds? []  Sore throat?  Musculoskeletal:  []  Back pain? []  Joint pain? []  Muscle pain?   Physical Examination   Vitals:   10/29/24 1012  BP: (!) 194/83  Pulse: 62  Temp: 97.8 F (36.6 C)  SpO2: 99%  Weight: 124 lb (56.2 kg)  Height: 5' 1.5 (1.562 m)   Body mass index is 23.05 kg/m.  General:  WDWN in NAD; vital signs documented above Gait: Not observed HENT: WNL, normocephalic Pulmonary: normal non-labored breathing  Cardiac: regular Abdomen: soft, NT, no masses Skin: without rashes Vascular  Exam/Pulses: palpable radial pulses bilaterally Extremities: without ischemic changes, without gangrene , without cellulitis; without open wounds;  Musculoskeletal: no muscle wasting or atrophy  Neurologic: A&O X 3;  No focal weakness or paresthesias are detected Psychiatric:  The pt has normal affect  Non-Invasive Vascular Imaging   Bilateral Carotid Duplex (10/29/2024):  R ICA stenosis:  60-79% R VA:  patent and antegrade L ICA stenosis:  60-79% L VA:  patent and antegrade   Medical Decision Making   Sharon Wilson is a 83 y.o. female who presents for surveillance of carotid artery stenosis  Based on the patient's vascular studies, her right carotid artery stenosis is stable at 60 to 79%.  Her left carotid artery stenosis has increased from 40-59% to 60-79% She denies any strokelike symptoms such as slurred speech, facial droop, sudden visual changes, or sudden weakness/numbness. She has palpable radial pulses bilaterally.  She is neurologically intact on exam She will continue her daily aspirin , plavix ,  and statin.  She can follow-up with our office in 6 months with repeat carotid duplex.  Her right lower extremity arterial studies and ABIs will need to be repeated in 1 year   Ahmed Holster PA-C [facility] Office: [phone]  Clinic MD: Gretta  "

## 2024-11-04 ENCOUNTER — Other Ambulatory Visit: Payer: Self-pay | Admitting: *Deleted

## 2024-11-04 DIAGNOSIS — I6523 Occlusion and stenosis of bilateral carotid arteries: Secondary | ICD-10-CM

## 2025-01-15 ENCOUNTER — Other Ambulatory Visit

## 2025-01-20 ENCOUNTER — Ambulatory Visit

## 2025-01-22 ENCOUNTER — Ambulatory Visit: Admitting: Internal Medicine

## 2025-05-06 ENCOUNTER — Ambulatory Visit (HOSPITAL_COMMUNITY)

## 2025-05-06 ENCOUNTER — Ambulatory Visit
